# Patient Record
Sex: Female | Born: 1965 | Race: White | Hispanic: No | Smoking: Former smoker
Health system: Southern US, Community
[De-identification: ages and names within clinical notes are randomized; demographics above are authoritative.]

## PROBLEM LIST (undated history)

## (undated) DIAGNOSIS — E1143 Type 2 diabetes mellitus with diabetic autonomic (poly)neuropathy: Secondary | ICD-10-CM

## (undated) DIAGNOSIS — E785 Hyperlipidemia, unspecified: Secondary | ICD-10-CM

## (undated) DIAGNOSIS — Z5189 Encounter for other specified aftercare: Secondary | ICD-10-CM

## (undated) DIAGNOSIS — I10 Essential (primary) hypertension: Secondary | ICD-10-CM

## (undated) DIAGNOSIS — F419 Anxiety disorder, unspecified: Secondary | ICD-10-CM

## (undated) DIAGNOSIS — I219 Acute myocardial infarction, unspecified: Secondary | ICD-10-CM

## (undated) DIAGNOSIS — K219 Gastro-esophageal reflux disease without esophagitis: Secondary | ICD-10-CM

## (undated) DIAGNOSIS — I252 Old myocardial infarction: Secondary | ICD-10-CM

## (undated) DIAGNOSIS — R0789 Other chest pain: Secondary | ICD-10-CM

## (undated) DIAGNOSIS — J309 Allergic rhinitis, unspecified: Secondary | ICD-10-CM

## (undated) DIAGNOSIS — H269 Unspecified cataract: Secondary | ICD-10-CM

## (undated) DIAGNOSIS — I2510 Atherosclerotic heart disease of native coronary artery without angina pectoris: Secondary | ICD-10-CM

## (undated) DIAGNOSIS — E039 Hypothyroidism, unspecified: Secondary | ICD-10-CM

## (undated) DIAGNOSIS — R9430 Abnormal result of cardiovascular function study, unspecified: Secondary | ICD-10-CM

## (undated) DIAGNOSIS — K3184 Gastroparesis: Secondary | ICD-10-CM

## (undated) DIAGNOSIS — Z951 Presence of aortocoronary bypass graft: Secondary | ICD-10-CM

## (undated) DIAGNOSIS — I639 Cerebral infarction, unspecified: Secondary | ICD-10-CM

## (undated) DIAGNOSIS — T7840XA Allergy, unspecified, initial encounter: Secondary | ICD-10-CM

## (undated) DIAGNOSIS — H409 Unspecified glaucoma: Secondary | ICD-10-CM

## (undated) DIAGNOSIS — K760 Fatty (change of) liver, not elsewhere classified: Secondary | ICD-10-CM

## (undated) DIAGNOSIS — S8991XA Unspecified injury of right lower leg, initial encounter: Secondary | ICD-10-CM

## (undated) DIAGNOSIS — K222 Esophageal obstruction: Secondary | ICD-10-CM

## (undated) HISTORY — DX: Esophageal obstruction: K22.2

## (undated) HISTORY — DX: Cerebral infarction, unspecified: I63.9

## (undated) HISTORY — PX: TUBAL LIGATION: SHX77

## (undated) HISTORY — DX: Hypothyroidism, unspecified: E03.9

## (undated) HISTORY — DX: Abnormal result of cardiovascular function study, unspecified: R94.30

## (undated) HISTORY — DX: Atherosclerotic heart disease of native coronary artery without angina pectoris: I25.10

## (undated) HISTORY — PX: ANGIOPLASTY: SHX39

## (undated) HISTORY — DX: Acute myocardial infarction, unspecified: I21.9

## (undated) HISTORY — PX: CARDIAC SURGERY: SHX584

## (undated) HISTORY — DX: Unspecified glaucoma: H40.9

## (undated) HISTORY — DX: Gastroparesis: E11.43

## (undated) HISTORY — DX: Unspecified injury of right lower leg, initial encounter: S89.91XA

## (undated) HISTORY — DX: Old myocardial infarction: I25.2

## (undated) HISTORY — DX: Allergy, unspecified, initial encounter: T78.40XA

## (undated) HISTORY — PX: EYE SURGERY: SHX253

## (undated) HISTORY — DX: Presence of aortocoronary bypass graft: Z95.1

## (undated) HISTORY — DX: Hyperlipidemia, unspecified: E78.5

## (undated) HISTORY — DX: Allergic rhinitis, unspecified: J30.9

## (undated) HISTORY — DX: Fatty (change of) liver, not elsewhere classified: K76.0

## (undated) HISTORY — DX: Anxiety disorder, unspecified: F41.9

## (undated) HISTORY — DX: Encounter for other specified aftercare: Z51.89

## (undated) HISTORY — DX: Unspecified cataract: H26.9

## (undated) HISTORY — DX: Type 2 diabetes mellitus with diabetic autonomic (poly)neuropathy: K31.84

## (undated) HISTORY — DX: Gastro-esophageal reflux disease without esophagitis: K21.9

---

## 1998-10-17 ENCOUNTER — Emergency Department (HOSPITAL_COMMUNITY): Admission: EM | Admit: 1998-10-17 | Discharge: 1998-10-17 | Payer: Self-pay | Admitting: Emergency Medicine

## 1998-10-17 ENCOUNTER — Encounter: Payer: Self-pay | Admitting: Emergency Medicine

## 2002-10-06 ENCOUNTER — Encounter: Payer: Self-pay | Admitting: Emergency Medicine

## 2002-10-06 ENCOUNTER — Emergency Department (HOSPITAL_COMMUNITY): Admission: EM | Admit: 2002-10-06 | Discharge: 2002-10-06 | Payer: Self-pay | Admitting: Emergency Medicine

## 2003-06-23 ENCOUNTER — Ambulatory Visit (HOSPITAL_COMMUNITY): Admission: RE | Admit: 2003-06-23 | Discharge: 2003-06-23 | Payer: Self-pay | Admitting: Family Medicine

## 2004-04-04 ENCOUNTER — Emergency Department (HOSPITAL_COMMUNITY): Admission: EM | Admit: 2004-04-04 | Discharge: 2004-04-04 | Payer: Self-pay | Admitting: Emergency Medicine

## 2005-10-10 ENCOUNTER — Ambulatory Visit: Payer: Self-pay | Admitting: Cardiology

## 2005-10-18 ENCOUNTER — Ambulatory Visit: Payer: Self-pay | Admitting: Cardiology

## 2006-07-03 ENCOUNTER — Inpatient Hospital Stay (HOSPITAL_COMMUNITY): Admission: AD | Admit: 2006-07-03 | Discharge: 2006-07-05 | Payer: Self-pay | Admitting: Cardiovascular Disease

## 2006-07-03 ENCOUNTER — Ambulatory Visit: Payer: Self-pay | Admitting: Cardiology

## 2006-07-26 ENCOUNTER — Ambulatory Visit: Payer: Self-pay | Admitting: Cardiology

## 2006-08-29 ENCOUNTER — Ambulatory Visit: Payer: Self-pay | Admitting: Physician Assistant

## 2006-08-31 ENCOUNTER — Inpatient Hospital Stay (HOSPITAL_BASED_OUTPATIENT_CLINIC_OR_DEPARTMENT_OTHER): Admission: RE | Admit: 2006-08-31 | Discharge: 2006-08-31 | Payer: Self-pay | Admitting: Cardiology

## 2006-08-31 ENCOUNTER — Ambulatory Visit: Payer: Self-pay | Admitting: Cardiology

## 2006-09-26 ENCOUNTER — Ambulatory Visit: Payer: Self-pay | Admitting: Cardiology

## 2006-11-06 ENCOUNTER — Ambulatory Visit: Payer: Self-pay | Admitting: Cardiology

## 2006-12-24 ENCOUNTER — Ambulatory Visit: Payer: Self-pay | Admitting: Cardiology

## 2007-02-07 ENCOUNTER — Ambulatory Visit: Payer: Self-pay | Admitting: Cardiology

## 2007-02-08 ENCOUNTER — Ambulatory Visit (HOSPITAL_COMMUNITY): Admission: RE | Admit: 2007-02-08 | Discharge: 2007-02-08 | Payer: Self-pay | Admitting: Internal Medicine

## 2007-03-11 ENCOUNTER — Emergency Department (HOSPITAL_COMMUNITY): Admission: EM | Admit: 2007-03-11 | Discharge: 2007-03-12 | Payer: Self-pay | Admitting: Emergency Medicine

## 2007-03-13 ENCOUNTER — Ambulatory Visit: Payer: Self-pay | Admitting: Gastroenterology

## 2007-03-14 ENCOUNTER — Ambulatory Visit: Payer: Self-pay | Admitting: Gastroenterology

## 2007-03-14 DIAGNOSIS — K209 Esophagitis, unspecified without bleeding: Secondary | ICD-10-CM | POA: Insufficient documentation

## 2007-04-22 ENCOUNTER — Ambulatory Visit: Payer: Self-pay | Admitting: Gastroenterology

## 2007-06-12 DIAGNOSIS — E119 Type 2 diabetes mellitus without complications: Secondary | ICD-10-CM | POA: Insufficient documentation

## 2007-06-12 DIAGNOSIS — Z794 Long term (current) use of insulin: Secondary | ICD-10-CM

## 2007-06-12 DIAGNOSIS — F419 Anxiety disorder, unspecified: Secondary | ICD-10-CM | POA: Insufficient documentation

## 2007-06-12 DIAGNOSIS — J45909 Unspecified asthma, uncomplicated: Secondary | ICD-10-CM | POA: Insufficient documentation

## 2007-06-12 DIAGNOSIS — R1310 Dysphagia, unspecified: Secondary | ICD-10-CM | POA: Insufficient documentation

## 2007-06-12 DIAGNOSIS — I25810 Atherosclerosis of coronary artery bypass graft(s) without angina pectoris: Secondary | ICD-10-CM | POA: Insufficient documentation

## 2007-06-21 ENCOUNTER — Ambulatory Visit: Payer: Self-pay | Admitting: Gastroenterology

## 2007-08-16 ENCOUNTER — Ambulatory Visit: Payer: Self-pay | Admitting: Cardiology

## 2007-08-19 ENCOUNTER — Inpatient Hospital Stay (HOSPITAL_COMMUNITY): Admission: RE | Admit: 2007-08-19 | Discharge: 2007-08-20 | Payer: Self-pay | Admitting: Cardiology

## 2007-08-19 ENCOUNTER — Ambulatory Visit: Payer: Self-pay | Admitting: Cardiology

## 2007-08-19 ENCOUNTER — Inpatient Hospital Stay (HOSPITAL_BASED_OUTPATIENT_CLINIC_OR_DEPARTMENT_OTHER): Admission: RE | Admit: 2007-08-19 | Discharge: 2007-08-19 | Payer: Self-pay | Admitting: Cardiology

## 2007-08-24 ENCOUNTER — Emergency Department (HOSPITAL_COMMUNITY): Admission: EM | Admit: 2007-08-24 | Discharge: 2007-08-25 | Payer: Self-pay | Admitting: Emergency Medicine

## 2007-09-13 ENCOUNTER — Encounter: Payer: Self-pay | Admitting: Physician Assistant

## 2007-09-13 ENCOUNTER — Ambulatory Visit: Payer: Self-pay | Admitting: Cardiology

## 2007-09-26 ENCOUNTER — Encounter: Payer: Self-pay | Admitting: Cardiology

## 2008-01-07 ENCOUNTER — Encounter: Payer: Self-pay | Admitting: Gastroenterology

## 2008-01-13 ENCOUNTER — Ambulatory Visit: Payer: Self-pay | Admitting: Cardiology

## 2008-02-04 ENCOUNTER — Ambulatory Visit: Payer: Self-pay | Admitting: Gastroenterology

## 2008-02-04 DIAGNOSIS — R079 Chest pain, unspecified: Secondary | ICD-10-CM | POA: Insufficient documentation

## 2008-02-18 ENCOUNTER — Telehealth: Payer: Self-pay | Admitting: Gastroenterology

## 2008-02-26 ENCOUNTER — Ambulatory Visit (HOSPITAL_COMMUNITY): Admission: RE | Admit: 2008-02-26 | Discharge: 2008-02-26 | Payer: Self-pay | Admitting: Internal Medicine

## 2008-04-22 ENCOUNTER — Encounter: Payer: Self-pay | Admitting: Cardiology

## 2008-05-22 DIAGNOSIS — I219 Acute myocardial infarction, unspecified: Secondary | ICD-10-CM

## 2008-05-22 HISTORY — PX: OTHER SURGICAL HISTORY: SHX169

## 2008-05-22 HISTORY — DX: Acute myocardial infarction, unspecified: I21.9

## 2008-08-07 ENCOUNTER — Telehealth: Payer: Self-pay | Admitting: Gastroenterology

## 2008-08-07 ENCOUNTER — Encounter: Payer: Self-pay | Admitting: Cardiology

## 2008-09-30 ENCOUNTER — Encounter: Payer: Self-pay | Admitting: Cardiology

## 2008-11-02 ENCOUNTER — Ambulatory Visit: Payer: Self-pay | Admitting: Cardiology

## 2008-12-09 ENCOUNTER — Telehealth: Payer: Self-pay | Admitting: Gastroenterology

## 2009-01-18 ENCOUNTER — Telehealth: Payer: Self-pay | Admitting: Gastroenterology

## 2009-01-19 ENCOUNTER — Encounter: Payer: Self-pay | Admitting: Gastroenterology

## 2009-01-19 ENCOUNTER — Telehealth: Payer: Self-pay | Admitting: Cardiology

## 2009-01-19 DIAGNOSIS — K219 Gastro-esophageal reflux disease without esophagitis: Secondary | ICD-10-CM | POA: Insufficient documentation

## 2009-01-22 ENCOUNTER — Ambulatory Visit: Payer: Self-pay | Admitting: Cardiovascular Disease

## 2009-01-22 ENCOUNTER — Emergency Department (HOSPITAL_COMMUNITY): Admission: EM | Admit: 2009-01-22 | Discharge: 2009-01-22 | Payer: Self-pay | Admitting: Emergency Medicine

## 2009-01-22 ENCOUNTER — Ambulatory Visit (HOSPITAL_COMMUNITY): Admission: RE | Admit: 2009-01-22 | Discharge: 2009-01-22 | Payer: Self-pay | Admitting: Gastroenterology

## 2009-01-22 ENCOUNTER — Ambulatory Visit: Payer: Self-pay | Admitting: Gastroenterology

## 2009-01-22 ENCOUNTER — Inpatient Hospital Stay (HOSPITAL_COMMUNITY): Admission: AD | Admit: 2009-01-22 | Discharge: 2009-01-31 | Payer: Self-pay | Admitting: Cardiology

## 2009-01-23 ENCOUNTER — Encounter: Payer: Self-pay | Admitting: Cardiology

## 2009-01-23 ENCOUNTER — Ambulatory Visit: Payer: Self-pay | Admitting: Cardiothoracic Surgery

## 2009-01-26 HISTORY — PX: CORONARY ARTERY BYPASS GRAFT: SHX141

## 2009-02-01 ENCOUNTER — Telehealth: Payer: Self-pay | Admitting: Gastroenterology

## 2009-02-03 ENCOUNTER — Telehealth: Payer: Self-pay | Admitting: Cardiology

## 2009-02-05 ENCOUNTER — Emergency Department (HOSPITAL_COMMUNITY): Admission: EM | Admit: 2009-02-05 | Discharge: 2009-02-06 | Payer: Self-pay | Admitting: Emergency Medicine

## 2009-02-15 ENCOUNTER — Ambulatory Visit: Payer: Self-pay | Admitting: Cardiology

## 2009-02-15 ENCOUNTER — Ambulatory Visit: Payer: Self-pay | Admitting: Cardiovascular Disease

## 2009-02-16 ENCOUNTER — Encounter: Payer: Self-pay | Admitting: Cardiology

## 2009-02-16 ENCOUNTER — Inpatient Hospital Stay (HOSPITAL_COMMUNITY): Admission: EM | Admit: 2009-02-16 | Discharge: 2009-02-19 | Payer: Self-pay | Admitting: Emergency Medicine

## 2009-02-16 ENCOUNTER — Encounter (INDEPENDENT_AMBULATORY_CARE_PROVIDER_SITE_OTHER): Payer: Self-pay | Admitting: Cardiovascular Disease

## 2009-02-16 ENCOUNTER — Ambulatory Visit: Payer: Self-pay | Admitting: Vascular Surgery

## 2009-02-17 ENCOUNTER — Encounter: Payer: Self-pay | Admitting: Cardiology

## 2009-02-17 ENCOUNTER — Encounter: Payer: Self-pay | Admitting: Internal Medicine

## 2009-02-25 ENCOUNTER — Ambulatory Visit: Payer: Self-pay | Admitting: Cardiothoracic Surgery

## 2009-02-25 ENCOUNTER — Encounter: Payer: Self-pay | Admitting: Cardiology

## 2009-02-25 ENCOUNTER — Encounter: Admission: RE | Admit: 2009-02-25 | Discharge: 2009-02-25 | Payer: Self-pay | Admitting: Cardiothoracic Surgery

## 2009-03-08 ENCOUNTER — Encounter: Payer: Self-pay | Admitting: Cardiology

## 2009-03-22 ENCOUNTER — Encounter: Payer: Self-pay | Admitting: Cardiology

## 2009-03-26 ENCOUNTER — Encounter: Payer: Self-pay | Admitting: Cardiology

## 2009-03-26 ENCOUNTER — Ambulatory Visit: Payer: Self-pay | Admitting: Cardiothoracic Surgery

## 2009-03-30 ENCOUNTER — Telehealth: Payer: Self-pay | Admitting: Gastroenterology

## 2009-03-31 ENCOUNTER — Encounter: Payer: Self-pay | Admitting: Cardiology

## 2009-04-01 ENCOUNTER — Ambulatory Visit: Payer: Self-pay | Admitting: Gastroenterology

## 2009-04-07 ENCOUNTER — Encounter: Payer: Self-pay | Admitting: Physician Assistant

## 2009-04-09 ENCOUNTER — Encounter: Payer: Self-pay | Admitting: Cardiothoracic Surgery

## 2009-04-09 ENCOUNTER — Ambulatory Visit (HOSPITAL_COMMUNITY): Admission: RE | Admit: 2009-04-09 | Discharge: 2009-04-09 | Payer: Self-pay | Admitting: Gastroenterology

## 2009-04-09 ENCOUNTER — Ambulatory Visit: Payer: Self-pay | Admitting: Gastroenterology

## 2009-04-12 ENCOUNTER — Ambulatory Visit: Payer: Self-pay | Admitting: Cardiology

## 2009-04-12 DIAGNOSIS — R062 Wheezing: Secondary | ICD-10-CM | POA: Insufficient documentation

## 2009-04-12 DIAGNOSIS — R002 Palpitations: Secondary | ICD-10-CM | POA: Insufficient documentation

## 2009-04-21 ENCOUNTER — Ambulatory Visit: Payer: Self-pay | Admitting: Cardiology

## 2009-04-24 ENCOUNTER — Encounter: Payer: Self-pay | Admitting: Cardiology

## 2009-04-28 ENCOUNTER — Telehealth: Payer: Self-pay | Admitting: Gastroenterology

## 2009-04-29 ENCOUNTER — Encounter (INDEPENDENT_AMBULATORY_CARE_PROVIDER_SITE_OTHER): Payer: Self-pay | Admitting: *Deleted

## 2009-04-29 ENCOUNTER — Ambulatory Visit: Payer: Self-pay | Admitting: Internal Medicine

## 2009-04-29 DIAGNOSIS — R1319 Other dysphagia: Secondary | ICD-10-CM | POA: Insufficient documentation

## 2009-04-29 DIAGNOSIS — M62838 Other muscle spasm: Secondary | ICD-10-CM | POA: Insufficient documentation

## 2009-05-05 ENCOUNTER — Telehealth: Payer: Self-pay | Admitting: Gastroenterology

## 2009-05-13 ENCOUNTER — Ambulatory Visit: Payer: Self-pay | Admitting: Gastroenterology

## 2009-05-13 DIAGNOSIS — R634 Abnormal weight loss: Secondary | ICD-10-CM | POA: Insufficient documentation

## 2009-05-17 ENCOUNTER — Encounter: Payer: Self-pay | Admitting: Cardiology

## 2009-06-14 ENCOUNTER — Ambulatory Visit: Payer: Self-pay | Admitting: Gastroenterology

## 2009-06-14 DIAGNOSIS — R112 Nausea with vomiting, unspecified: Secondary | ICD-10-CM | POA: Insufficient documentation

## 2009-06-22 ENCOUNTER — Ambulatory Visit (HOSPITAL_COMMUNITY): Admission: RE | Admit: 2009-06-22 | Discharge: 2009-06-22 | Payer: Self-pay | Admitting: Gastroenterology

## 2009-06-30 ENCOUNTER — Ambulatory Visit: Payer: Self-pay | Admitting: Gastroenterology

## 2009-07-05 ENCOUNTER — Telehealth: Payer: Self-pay | Admitting: Gastroenterology

## 2009-07-06 ENCOUNTER — Encounter: Payer: Self-pay | Admitting: Gastroenterology

## 2009-07-07 ENCOUNTER — Telehealth: Payer: Self-pay | Admitting: Gastroenterology

## 2009-07-08 ENCOUNTER — Encounter: Payer: Self-pay | Admitting: Gastroenterology

## 2009-07-30 ENCOUNTER — Ambulatory Visit: Payer: Self-pay | Admitting: Gastroenterology

## 2009-08-02 ENCOUNTER — Telehealth: Payer: Self-pay | Admitting: Gastroenterology

## 2009-08-02 ENCOUNTER — Encounter: Payer: Self-pay | Admitting: Gastroenterology

## 2009-08-03 ENCOUNTER — Encounter: Admission: RE | Admit: 2009-08-03 | Discharge: 2009-11-01 | Payer: Self-pay | Admitting: Neurological Surgery

## 2009-08-04 ENCOUNTER — Encounter: Payer: Self-pay | Admitting: Gastroenterology

## 2009-09-06 ENCOUNTER — Telehealth: Payer: Self-pay | Admitting: Gastroenterology

## 2009-09-27 ENCOUNTER — Ambulatory Visit: Payer: Self-pay | Admitting: Cardiology

## 2009-10-07 ENCOUNTER — Encounter (INDEPENDENT_AMBULATORY_CARE_PROVIDER_SITE_OTHER): Payer: Self-pay | Admitting: *Deleted

## 2009-10-25 ENCOUNTER — Telehealth: Payer: Self-pay | Admitting: Gastroenterology

## 2009-12-07 ENCOUNTER — Telehealth: Payer: Self-pay | Admitting: Gastroenterology

## 2010-01-06 ENCOUNTER — Encounter (INDEPENDENT_AMBULATORY_CARE_PROVIDER_SITE_OTHER): Payer: Self-pay | Admitting: *Deleted

## 2010-01-11 ENCOUNTER — Encounter (INDEPENDENT_AMBULATORY_CARE_PROVIDER_SITE_OTHER): Payer: Self-pay | Admitting: *Deleted

## 2010-01-11 ENCOUNTER — Telehealth (INDEPENDENT_AMBULATORY_CARE_PROVIDER_SITE_OTHER): Payer: Self-pay | Admitting: *Deleted

## 2010-01-11 ENCOUNTER — Ambulatory Visit: Payer: Self-pay | Admitting: Cardiology

## 2010-01-17 ENCOUNTER — Encounter: Payer: Self-pay | Admitting: Cardiology

## 2010-01-17 ENCOUNTER — Ambulatory Visit: Payer: Self-pay | Admitting: Cardiology

## 2010-01-19 ENCOUNTER — Encounter (INDEPENDENT_AMBULATORY_CARE_PROVIDER_SITE_OTHER): Payer: Self-pay | Admitting: *Deleted

## 2010-01-20 ENCOUNTER — Telehealth: Payer: Self-pay | Admitting: Gastroenterology

## 2010-01-26 ENCOUNTER — Telehealth (INDEPENDENT_AMBULATORY_CARE_PROVIDER_SITE_OTHER): Payer: Self-pay | Admitting: *Deleted

## 2010-02-01 ENCOUNTER — Encounter: Payer: Self-pay | Admitting: Physician Assistant

## 2010-02-15 ENCOUNTER — Encounter (INDEPENDENT_AMBULATORY_CARE_PROVIDER_SITE_OTHER): Payer: Self-pay | Admitting: *Deleted

## 2010-02-21 ENCOUNTER — Encounter: Payer: Self-pay | Admitting: Physician Assistant

## 2010-02-21 ENCOUNTER — Ambulatory Visit: Payer: Self-pay | Admitting: Cardiology

## 2010-02-21 DIAGNOSIS — E785 Hyperlipidemia, unspecified: Secondary | ICD-10-CM | POA: Insufficient documentation

## 2010-02-23 ENCOUNTER — Telehealth (INDEPENDENT_AMBULATORY_CARE_PROVIDER_SITE_OTHER): Payer: Self-pay | Admitting: *Deleted

## 2010-03-01 ENCOUNTER — Telehealth: Payer: Self-pay | Admitting: Gastroenterology

## 2010-03-09 ENCOUNTER — Encounter (INDEPENDENT_AMBULATORY_CARE_PROVIDER_SITE_OTHER): Payer: Self-pay | Admitting: *Deleted

## 2010-03-29 ENCOUNTER — Other Ambulatory Visit: Admission: RE | Admit: 2010-03-29 | Discharge: 2010-03-29 | Payer: Self-pay | Admitting: Obstetrics & Gynecology

## 2010-04-07 ENCOUNTER — Ambulatory Visit (HOSPITAL_COMMUNITY): Admission: RE | Admit: 2010-04-07 | Payer: Self-pay | Admitting: Obstetrics & Gynecology

## 2010-04-11 ENCOUNTER — Telehealth: Payer: Self-pay | Admitting: Gastroenterology

## 2010-05-18 ENCOUNTER — Telehealth (INDEPENDENT_AMBULATORY_CARE_PROVIDER_SITE_OTHER): Payer: Self-pay | Admitting: *Deleted

## 2010-05-24 ENCOUNTER — Telehealth: Payer: Self-pay | Admitting: Gastroenterology

## 2010-06-11 ENCOUNTER — Encounter: Payer: Self-pay | Admitting: Obstetrics & Gynecology

## 2010-06-23 NOTE — Progress Notes (Signed)
Summary: Medication  Phone Note Call from Patient Call back at Home Phone [phone]   Caller: Patient Call For: Dr. Arlyce Dice Reason for Call: Refill Medication Summary of Call: Needs a refill on her Hyomax and Xanax.....[facility] [place] [phone] Initial call taken by: Karna Christmas,  May 24, 2010 9:14 AM  Follow-up for Phone Call        Meds sent to pharmacy Follow-up by: Merri Ray CMA Duncan Dull),  May 24, 2010 1:01 PM    Prescriptions: ALPRAZOLAM 0.5 MG TABS (ALPRAZOLAM) take one to 2 tabs q.h.s. p.r.n.  #30 x 2   Entered by:   Merri Ray CMA (AAMA)   Authorized by:   Louis Meckel MD   Signed by:   Merri Ray CMA (AAMA) on 05/24/2010   Method used:   Printed then faxed to ...       [facility] (retail)       [address]       [place], [place]  [address]       Ph: [phone]       Fax: [phone]   RxID:   [id number] HYOMAX-SL 0.125 MG SUBL (HYOSCYAMINE SULFATE) Dissovle under tounge every four hours  #30 x 3   Entered by:   Merri Ray CMA (AAMA)   Authorized by:   Louis Meckel MD   Signed by:   Merri Ray CMA (AAMA) on 05/24/2010   Method used:   Electronically to        [facility]* (retail)       [address]       [place], [place]  [address]       Ph: [phone]       Fax: [phone]   RxID:   [id number]

## 2010-06-23 NOTE — Letter (Signed)
Summary: Appt Reminder 2  [facility]  [address] [place], [place] [address]   Phone: [phone]  Fax: [phone]        July 06, 2009 MRN: [id number]    CHARMELLE SOH [address] [place], [place]  [address]    Dear Ms. Dingley,   You have a return appointment with Dr.Emmerson Taddei Arlyce Dice on 07-30-09 at 1:45pm. Please remember to bring a complete list of the medicines you are taking, your insurance card and your co-pay.  If you have to cancel or reschedule this appointment, please call before 5:00 pm the evening before to avoid a cancellation fee.  If you have any questions or concerns, please call [phone].    Sincerely,    Laureen Ochs LPN  Appended Document: Appt Reminder 2 Letter mailed to patient.

## 2010-06-23 NOTE — Assessment & Plan Note (Signed)
Summary: 6 MO FU PER MAY REMINDER-SRS   Visit Type:  Follow-up Referring Provider:  n/a Primary Provider:  Alfredia Client, MD  CC:  follow-up visit.  History of Present Illness: the patient is a 45 year old female with a history of coronary bypass grafting x4 January 26, 2009. Prior to this the patient had several stent placements. She has had continuous chest pain/chest wall pain after her surgery. She reports pain in the left side of his chest as well as around the site of the sternotomy upon palpation as well is lying on her left side. The patient has tried taking Tylenol with little relief. I palpated her sternotomy and although it is not clear dehiscence the sternum does not appear to be well fused. The patient denies any palpitations shortness of breath orthopnea PND.  The patient has diabetes has been recently diagnosed with gastroparesis. She was unable to tolerate Reglan and is now taking erythromycin without any significant side effects.  Preventive Screening-Counseling & Management  Alcohol-Tobacco     Smoking Status: quit     Year Quit: 1995   Current Medications (verified): 1)  Aspir-Trin 325 Mg Tbec (Aspirin) .... Take 1 Tablet By Mouth Once A Day 2)  Nitrostat 0.4 Mg Subl (Nitroglycerin) .... Use As Directed 3)  Albuterol Sulfate (2.5 Mg/11ml) 0.083% Nebu (Albuterol Sulfate) .... As Needed 4)  Crestor 10 Mg Tabs (Rosuvastatin Calcium) .... Take 1 Tablet By Mouth Once A Day 5)  Atenolol 25 Mg Tabs (Atenolol) .... One Tablet By Mouth Once Daily 6)  Lorazepam 0.5 Mg Tabs (Lorazepam) .... Take 1 Tablet By Mouth Once A Day 7)  Humalog Mix 75/25 75-25 % Susp (Insulin Lispro Prot & Lispro) .Marland Kitchen.. 10 Units Before Meals 8)  Flovent Hfa 44 Mcg/act Aero (Fluticasone Propionate  Hfa) .... 2 Puffs Twice A Day 9)  Hyomax-Sl 0.125 Mg Subl (Hyoscyamine Sulfate) .... Dissovle Under Tounge Every Four Hours 10)  Prevacid Solutab 30 Mg Tbdp (Lansoprazole) .... Take One Daily 11)  Alprazolam 0.5  Mg Tabs (Alprazolam) .... Take One To 2 Tabs Q.h.s. P.r.n. 12)  Erythromycin Base 250 Mg Cpep (Erythromycin Base) .... Take 1 &1/4 Tsp By Mouth Four Times A Day 13)  Tylenol With Codeine #3 300-30 Mg Tabs (Acetaminophen-Codeine) .... Take 1-2 Tabs Every 6 Hours As Needed Pain  Allergies (verified): 1)  ! Penicillin 2)  ! Metformin  Comments:  Nurse/Medical Assistant: The patient's medications and allergies were reviewed with the patient and were updated in the Medication and Allergy Lists. List and verbally reviewed.  Past History:  Past Medical History: Last updated: 05/13/2009 ANXIETY (ICD-300.00) ASTHMA (ICD-493.90) DIABETES MELLITUS (ICD-250.00) Hx of MYOCARDIAL INFARCTION, HX OF (ICD-412)  s/p angioplasty with stents CORONARY ARTERY DISEASE (ICD-414.00) GERD/Esophageal Stricture coronary artery bypass grafting x4 January 26, 2009 Postoperative chest pain secondary to muscle spasm  Past Surgical History: Last updated: 04/01/2009 PTCA-Stent Open Heart Surgery--Bypass   Family History: Last updated: 06/30/2009 Family History of Diabetes: mother, maternal aunt, paternal uncles, paternal aunts Family History of Heart Disease: father, paternal uncles No FH of Colon Cancer  Social History: Last updated: 06/30/2009 Occupation: Unemployed Patient is a former smoker.  Alcohol Use - no Illicit Drug Use - no Patient gets regular exercise. Daily Caffeine Use  Risk Factors: Exercise: yes (02/04/2008)  Risk Factors: Smoking Status: quit (09/27/2009)  Review of Systems  The patient denies fatigue, malaise, fever, weight gain/loss, vision loss, decreased hearing, hoarseness, chest pain, palpitations, shortness of breath, prolonged cough, wheezing, sleep apnea, coughing  up blood, abdominal pain, blood in stool, nausea, vomiting, diarrhea, heartburn, incontinence, blood in urine, muscle weakness, joint pain, leg swelling, rash, skin lesions, headache, fainting, dizziness,  depression, anxiety, enlarged lymph nodes, easy bruising or bleeding, and environmental allergies.    Vital Signs:  Patient profile:   45 year old female Height:      64 inches Weight:      169 pounds Pulse rate:   69 / minute BP sitting:   113 / 72  (left arm) Cuff size:   regular  Vitals Entered By: Carlye Grippe (Sep 27, 2009 1:46 PM) CC: follow-up visit   Physical Exam  Additional Exam:  General: Well-developed, well-nourished in no distress head: Normocephalic and atraumatic eyes PERRLA/EOMI intact, conjunctiva and lids normal nose: No deformity or lesions mouth normal dentition, normal posterior pharynx neck: Supple, no JVD.  No masses, thyromegaly or abnormal cervical nodes lungs: Normal breath sounds bilaterally without wheezing.  Normal percussion heart: regular rate and rhythm with normal S1 and S2, no S3 or S4.  PMI is normal.  No pathological murmurs abdomen: Normal bowel sounds, abdomen is soft and nontender without masses, organomegaly or hernias noted.  No hepatosplenomegaly musculoskeletal: Back normal, normal gait muscle strength and tone normal pulsus: Pulse is normal in all 4 extremities Extremities: No peripheral pitting edema neurologic: Alert and oriented x 3 skin: Intact without lesions or rashes cervical nodes: No significant adenopathy psychologic: Normal affect    Impression & Recommendations:  Problem # 1:  POSTSURGICAL AORTOCORONARY BYPASS STATUS (ICD-V45.81) the patient has no recurrent chest pain consistent with angina. She does have pain at the sternotomy site and left hemi-chest. I've ordered a chest x-ray to evaluate her sternum.I have given the patient a prescription for Tylenol #3 in the interim. She may need a referral to the pain clinic. The patient also will have laboratory work done to check her lipid panel and liver function tests. Orders: T-Chest x-ray, 2 views ([address])  Problem # 2:  DYSPHAGIA (JWJ-191.47) patient has been  diagnosed with gastroparesis and is taking erythromycin. I recommended a probiotic as supplement  Problem # 3:  WHEEZING (ICD-786.07) patient takes inhalers. She stable and has no wheezing.  Problem # 4:  DIABETES MELLITUS (ICD-250.00) Assessment: Comment Only  Her updated medication list for this problem includes:    Aspir-trin 325 Mg Tbec (Aspirin) .Marland Kitchen... Take 1 tablet by mouth once a day    Humalog Mix 75/25 75-25 % Susp (Insulin lispro prot & lispro) .Marland KitchenMarland KitchenMarland KitchenMarland Kitchen 10 units before meals  Other Orders: T-Lipid Profile ([id number]) T-Hepatic Function [phone])  Patient Instructions: 1)  Tylenol #3  2)  Chest x-ray  3)  Labs:  lipid panel, liver function  4)  Follow up in  6 months  Prescriptions: TYLENOL WITH CODEINE #3 300-30 MG TABS (ACETAMINOPHEN-CODEINE) take 1-2 tabs every 6 hours as needed pain  #30 x 1   Entered by:   Hoover Brunette, LPN   Authorized by:   Lewayne Bunting, MD, [facility]   Signed by:   Hoover Brunette, LPN on 52/84/1324   Method used:   Print then Give to Patient   RxID:   [id number]

## 2010-06-23 NOTE — Progress Notes (Signed)
Summary: rx refill request from mail order  Phone Note Refill Request Message from:  Fax from Pharmacy on August 02, 2009 11:31 AM  Refills Requested: Medication #1:  PREVACID SOLUTAB 30 MG TBDP Take one daily   Dosage confirmed as above?Dosage Confirmed   Brand Name Necessary? No   Supply Requested: 1 year  Medication #2:  E.E.S. GRANULES 200 MG/5ML SUSR 1 1/4 tsp. every 4 hours 30 min before meals and at bedtime.   Dosage confirmed as above?Dosage Confirmed   Brand Name Necessary? No   Supply Requested: 1 year  Method Requested: Fax to Local Pharmacy Initial call taken by: Merri Ray CMA Duncan Dull),  August 02, 2009 11:31 AM     Appended Document: rx refill request from mail order Pt aprroved through mail order for 1 year-prior auth approved will send approval to be scanned in

## 2010-06-23 NOTE — Assessment & Plan Note (Signed)
Summary: 6 WK F/U PER 8/23 APPT W/DR. CRENSHAW-JM   Visit Type:  Follow-up Primary Provider:  Tamsen Snider Joseph Art)   History of Present Illness: patient returns to office, following recent evaluation for chest pain. She was seen by Dr. Jens Som, and referred for a stress test for evaluation of chest pain, deemed atypical, and most likely musculoskeletal in etiology. A pharmacologic perfusion imaging study was completed, and interpreted as normal by Dr. Andee Lineman.  Clinically, the patient denies any exacerbation of this discomfort, which has persisted since her bypass surgery of one year ago, either by deep inspiration, movement of the upper thorax, or with walking. She refers to a sensation of a "knot" in the left upper chest, and has  easily-induced discomfort with palpation.  Preventive Screening-Counseling & Management  Alcohol-Tobacco     Smoking Status: quit     Year Quit: 1995  Current Medications (verified): 1)  Aspir-Trin 325 Mg Tbec (Aspirin) .... Take 1 Tablet By Mouth Once A Day 2)  Nitrostat 0.4 Mg Subl (Nitroglycerin) .... Use As Directed 3)  Albuterol Sulfate (2.5 Mg/25ml) 0.083% Nebu (Albuterol Sulfate) .... As Needed 4)  Crestor 10 Mg Tabs (Rosuvastatin Calcium) .... Take 1 Tablet By Mouth Once A Day 5)  Atenolol 25 Mg Tabs (Atenolol) .... One Tablet By Mouth Once Daily 6)  Humalog Mix 75/25 75-25 % Susp (Insulin Lispro Prot & Lispro) .Marland Kitchen.. 10 Units Before Meals 7)  Flovent Hfa 44 Mcg/act Aero (Fluticasone Propionate  Hfa) .... 2 Puffs Twice A Day 8)  Hyomax-Sl 0.125 Mg Subl (Hyoscyamine Sulfate) .... Dissovle Under Tounge Every Four Hours 9)  Prevacid Solutab 30 Mg Tbdp (Lansoprazole) .... Take One Daily 10)  Alprazolam 0.5 Mg Tabs (Alprazolam) .... Take One To 2 Tabs Q.h.s. P.r.n. 11)  Erythromycin Base 250 Mg Cpep (Erythromycin Base) .... Take 1 &1/4 Tsp By Mouth Four Times A Day 12)  Tylenol With Codeine #3 300-30 Mg Tabs (Acetaminophen-Codeine) .... Take 1-2 Tabs  Every 6 Hours As Needed Pain  Allergies (verified): 1)  ! Penicillin 2)  ! Metformin  Comments:  Nurse/Medical Assistant: The patient is currently on medications but does not know the name or dosage at this time. Instructed to contact our office with details. Will update medication list at that time.  Past History:  Past Medical History: Last updated: 01/11/2010 ANXIETY (ICD-300.00) ASTHMA (ICD-493.90) DIABETES MELLITUS (ICD-250.00) Hx of MYOCARDIAL INFARCTION, HX OF (ICD-412)  s/p angioplasty with stents CORONARY ARTERY DISEASE (ICD-414.00) GERD/Esophageal Stricture Postoperative chest pain secondary to muscle spasm  Review of Systems       No fevers, chills, hemoptysis, dysphagia, melena, hematocheezia, hematuria, rash, claudication, orthopnea, pnd, pedal edema. All other systems negative.   Vital Signs:  Patient profile:   45 year old female Height:      64 inches Weight:      185 pounds Pulse rate:   71 / minute BP sitting:   112 / 75  (left arm) Cuff size:   large  Vitals Entered By: Carlye Grippe (February 21, 2010 1:26 PM)  Physical Exam  Additional Exam:  GEN: 45 year old female, in no distress HEENT: NCAT,PERRLA,EOMI NECK: palpable pulses, no bruits; no JVD; no TM LUNGS: CTA bilaterally HEART: RRR (S1S2); no significant murmurs; no rubs; no gallops ABD: soft, NT; intact BS EXT: intact distal pulses; no edema SKIN: warm, dry MUSC: significant discomfort with palpation of upper chest, along the sternum NEURO: A/O (x3)     Impression & Recommendations:  Problem # 1:  CHEST PAIN (ICD-786.50)  Patient had recent, normal Lexiscan Cardiolite for evaluation of atypical chest pain, most likely musculoskeletal in etiology. She has not had a recent x-ray, and we'll order a 2 view study to rule out possible underlying musculoskeletal pathology, which may have persisted since she underwent bypass surgery, in September of last year.  Problem # 2:  DYSLIPIDEMIA  (ICD-272.4)  we'll request most recent lipid profile from her primary care physician's office. Patient has not taken Crestor in a few months, citing difficulty swallowing this tablet. She was advised to crush the tablet, or allow it to dissolve, to ensure compliance. We will then make further recommendations, pending review of her most recent results. Recommend target LDL 70 or less, if feasible.  Problem # 3:  DIABETES MELLITUS (ICD-250.00) Assessment: Comment Only  Her updated medication list for this problem includes:    Aspir-trin 325 Mg Tbec (Aspirin) .Marland Kitchen... Take 1 tablet by mouth once a day    Humalog Mix 75/25 75-25 % Susp (Insulin lispro prot & lispro) .Marland KitchenMarland KitchenMarland KitchenMarland Kitchen 10 units before meals  Problem # 4:  DYSPHAGIA (ICD-787.29) Assessment: Comment Only  Other Orders: T-Chest x-ray, 2 views ([id number])  Patient Instructions: 1)  chest x-ray  2)  Follow up in  6 months

## 2010-06-23 NOTE — Progress Notes (Signed)
Summary: SHORTNESS OF BREATH & TIGHTNESS IN CHEST  Phone Note Call from Patient Call back at Home Phone [phone]   Reason for Call: Talk to Nurse Summary of Call: NEEDS TO SEE DEGENT SHORTNESS OF BREATH & TIGHTNESS IN CHEST. LEFT MESSAGE WITH GAYLE 01/10/10. Initial call taken by: Dorise Hiss,  January 11, 2010 10:04 AM  Follow-up for Phone Call        Pt has been having a lot of chest pressure. Then last week she had a spell that felt like "I was gone from here." She thought it was blood sugar but that was fine. She states after she ate she felt like she was gasping for breath. She states when she had that spell she felt weak all over her body. She states it was like an anxiety attack but she wasn't anxious about anything. She states it felt like when she had blockages before.  Pt wants to see MD asap. She will see Dr. Jens Som today at 1:45. Follow-up by: Cyril Loosen, RN, BSN,  January 11, 2010 10:52 AM

## 2010-06-23 NOTE — Progress Notes (Signed)
Summary: refill  Phone Note Call from Patient Call back at Home Phone [phone]   Caller: Patient Call For: Dr Arlyce Dice Reason for Call: Refill Medication, Talk to Nurse Summary of Call: Patient needs refill for her Xanax Initial call taken by: Tawni Levy,  January 20, 2010 9:21 AM  Follow-up for Phone Call        #30 w/2 refills phoned to pt. pharmacy, pt. is aware. Pt. instructed to call back as needed.  Follow-up by: Laureen Ochs LPN,  January 20, 2010 9:31 AM    Prescriptions: ALPRAZOLAM 0.5 MG TABS (ALPRAZOLAM) take one to 2 tabs q.h.s. p.r.n.  #30 x 2   Entered by:   Laureen Ochs LPN   Authorized by:   Louis Meckel MD   Signed by:   Laureen Ochs LPN on 40/02/2724   Method used:   Telephoned to ...       [facility] (retail)       [address]       [place], [place]  [address]       Ph: [phone]       Fax: [phone]   RxID:   [id number]

## 2010-06-23 NOTE — Medication Information (Signed)
Summary: [facility]  Silver[facility]   Imported By: Lester [place] 08/26/2009 09:56:49  _____________________________________________________________________  External Attachment:    Type:   Image     Comment:   External Document

## 2010-06-23 NOTE — Progress Notes (Signed)
Summary: CONDITION UPDATE  Phone Note Call from Patient Call back at Home Phone [phone] Call back at [phone]   Caller: Patient Call For: Dr. Arlyce Dice Reason for Call: Talk to Nurse Summary of Call: Reglan is helping, but side effects are bothersome. Also wants Prevacid Solutab script mailed to her. Initial call taken by: Vallarie Mare,  July 05, 2009 1:34 PM  Follow-up for Phone Call        On 06-30-09 was given Reglan 10mg  SL to take [facility]. She states her nausea has improved, she is eating better. But, c/o worsening fatigue, restlessness and bad dreams.   DR.Ninoshka Wainwright PLEASE ADVISE   Additional Follow-up for Phone Call Additional follow up Details #1::        try erythromycin 250mg  qid; d/c reglan. Additional Follow-up by: Louis Meckel MD,  July 06, 2009 9:31 AM    Additional Follow-up for Phone Call Additional follow up Details #2::    Above MD orders reviewed with patient. med. to pharmacy. Pt. to keep scheduled office visit 07-30-09 at 1:45pm. Pt. instructed to call back as needed.  Follow-up by: Laureen Ochs LPN,  July 06, 2009 9:42 AM  New/Updated Medications: ERY-TAB 250 MG TBEC (ERYTHROMYCIN BASE) Take 1 tab 30 minutes before breakfast, lunch, dinner and at bedtime. Prescriptions: PREVACID SOLUTAB 30 MG TBDP (LANSOPRAZOLE) Take one daily  #30 x 12   Entered by:   Laureen Ochs LPN   Authorized by:   Louis Meckel MD   Signed by:   Laureen Ochs LPN on 47/82/9562   Method used:   Print then Give to Patient   RxID:   [id number] ERY-TAB 250 MG TBEC (ERYTHROMYCIN BASE) Take 1 tab 30 minutes before breakfast, lunch, dinner and at bedtime.  #120 x 6   Entered by:   Laureen Ochs LPN   Authorized by:   Louis Meckel MD   Signed by:   Laureen Ochs LPN on 84/13/2440   Method used:   Electronically to        [facility] (retail)       [address]       [place], [place]  [address]       Ph: [phone]  Fax: [phone]   RxID:   [id number]

## 2010-06-23 NOTE — Progress Notes (Signed)
Summary: voicemail message     Phone Note Call from Patient   Summary of Call: MESSAGE LEFT ON VOICEMAIL  -   11:41 - Want to know if okay to take Lodine.  Just had teet pulled, given for pain.   1:32 - Called back and left another message wanting to know if she could get refill on her Tylenol #3.  Left message to return call.  Initial call taken by: Hoover Brunette, LPN,  February 23, 2010 3:12 PM  Follow-up for Phone Call        Left message to return call. Hoover Brunette, LPN  February 25, 2010 3:18 PM   Spoke with pt.  Stated she never got Lodine filled because pharm wanted her to check with [place] before taking.  Also was requesting refill on Tylenol #3.  Stated since she did not get those, she took regular Tylenol instead which worked fine.  Advised her that she did the correct thing because would have not been able to give refill on that med for this anyway.  No further complaints.  Patient verbalized understanding.  Follow-up by: Hoover Brunette, LPN,  March 01, 2010 3:57 PM

## 2010-06-23 NOTE — Progress Notes (Signed)
Summary: Medication refill  Phone Note Call from Patient Call back at Home Phone [phone]   Caller: Patient Call For: Dr. Arlyce Dice Summary of Call: Needs a refill on her Zanax...[phone] [facility] [place] Initial call taken by: Karna Christmas,  March 01, 2010 9:44 AM  Follow-up for Phone Call        Called pt to inform med sent, No answer Follow-up by: Merri Ray CMA Duncan Dull),  March 01, 2010 9:49 AM    Prescriptions: ALPRAZOLAM 0.5 MG TABS (ALPRAZOLAM) take one to 2 tabs q.h.s. p.r.n.  #30 x 2   Entered by:   Merri Ray CMA (AAMA)   Authorized by:   Louis Meckel MD   Signed by:   Merri Ray CMA (AAMA) on 03/01/2010   Method used:   Printed then faxed to ...       [facility] (retail)       [address]       [place], [place]  [address]       Ph: [phone]       Fax: [phone]   RxID:   [id number]

## 2010-06-23 NOTE — Letter (Signed)
Summary: Engineer, materials at [facility]  [address]   [place], [place] [address]   Phone: [phone]  Fax: [phone]        Oct 07, 2009 MRN: [id number]   KINZLEE SELVY [address] [place], [place]  [address]   Dear Ms. Taft,  Your test ordered by Selena Batten has been reviewed by your physician (or physician assistant) and was found to be normal or stable. Your physician (or physician assistant) felt no changes were needed at this time.  ____ Echocardiogram  ____ Cardiac Stress Test  ____ Lab Work  ____ Peripheral vascular study of arms, legs or neck  __X__ CT scan or X-ray  - chest x-ray  ____ Lung or Breathing test  ____ Other:   Thank you.   Hoover Brunette, LPN    Duane Boston, M.D., F.A.C.C. Thressa Sheller, M.D., F.A.C.C. Oneal Grout, M.D., F.A.C.C. Cheree Ditto, M.D., F.A.C.C. Daiva Nakayama, M.D., F.A.C.C. Kenney Houseman, M.D., F.A.C.C. Jeanne Ivan, PA-C

## 2010-06-23 NOTE — Medication Information (Signed)
Summary: RX Folder/ PATIENT ASSISTANCE [facility]  RX Folder/ PATIENT ASSISTANCE [facility]   Imported By: Dorise Hiss 05/27/2009 14:16:53  _____________________________________________________________________  External Attachment:    Type:   Image     Comment:   External Document

## 2010-06-23 NOTE — Medication Information (Signed)
Summary: Lansoprazole Approved/[facility]Script  Lansoprazole Approved/SilverScript   Imported By: Sherian Rein 08/06/2009 15:05:33  _____________________________________________________________________  External Attachment:    Type:   Image     Comment:   External Document

## 2010-06-23 NOTE — Progress Notes (Signed)
Summary: Medication refill  Phone Note Call from Patient Call back at Home Phone [phone]   Caller: Patient Call For: Dr. Arlyce Dice Reason for Call: Refill Medication Summary of Call: Needs a refill on her Zanax Initial call taken by: Karna Christmas,  September 06, 2009 9:15 AM  Follow-up for Phone Call        Called pt to inform faxing in med Follow-up by: Merri Ray CMA Duncan Dull),  September 06, 2009 10:26 AM    New/Updated Medications: ALPRAZOLAM 0.5 MG TABS (ALPRAZOLAM) take one to 2 tabs q.h.s. p.r.n. Prescriptions: ALPRAZOLAM 0.5 MG TABS (ALPRAZOLAM) take one to 2 tabs q.h.s. p.r.n.  #30 x 2   Entered by:   Merri Ray CMA (AAMA)   Authorized by:   Louis Meckel MD   Signed by:   Merri Ray CMA (AAMA) on 09/06/2009   Method used:   Printed then faxed to ...       [facility] (retail)       [address]       [place], [place]  [address]       Ph: [phone]       Fax: [phone]   RxID:   [id number]

## 2010-06-23 NOTE — Letter (Signed)
Summary: Engineer, materials at [facility]  [address]   [place], [place] [address]   Phone: [phone]  Fax: [phone]        March 09, 2010 MRN: [id number]   [name] [address] [place], [place]  [address]   Dear Ms. [name],  Your test ordered by Selena Batten has been reviewed by your physician (or physician assistant) and was found to be normal or stable. Your physician (or physician assistant) felt no changes were needed at this time.  ____ Echocardiogram  ____ Cardiac Stress Test  ____ Lab Work  ____ Peripheral vascular study of arms, legs or neck  __X__ CT scan or X-ray - chest  ____ Lung or Breathing test  ____ Other:   Thank you.   Hoover Brunette, LPN    [name], M.D., F.A.C.C. Thressa Sheller, M.D., F.A.C.C. Oneal Grout, M.D., F.A.C.C. Cheree Ditto, M.D., F.A.C.C. Daiva Nakayama, M.D., F.A.C.C. Kenney Houseman, M.D., F.A.C.C. Jeanne Ivan, PA-C

## 2010-06-23 NOTE — Assessment & Plan Note (Signed)
Summary: F/U FROM STARTING ERYTHROMYCIN           DEBORAH   History of Present Illness Visit Type: Follow-up Visit Primary GI MD: Melvia Heaps MD [facility] Primary Provider: Alfredia Client, MD Requesting Provider: n/a Chief Complaint: F/u from starting Erythromycin. Pt states that she is better and denies any GI complaints  History of Present Illness:   Mrs. Lehtinen has  returned for followup of her nausea and dysphagia.  Symptoms clearly improved with Reglan but this had to be discontinued because of side effects including nightmares and extreme fatigue.  On erythromycin her nausea and dysphagia have entirely subsided.  She is eating well and currently has no GI complaints.   GI Review of Systems      Denies abdominal pain, acid reflux, belching, bloating, chest pain, dysphagia with liquids, dysphagia with solids, heartburn, loss of appetite, nausea, vomiting, vomiting blood, weight loss, and  weight gain.        Denies anal fissure, black tarry stools, change in bowel habit, constipation, diarrhea, diverticulosis, fecal incontinence, heme positive stool, hemorrhoids, irritable bowel syndrome, jaundice, light color stool, liver problems, rectal bleeding, and  rectal pain.    Current Medications (verified): 1)  Aspirin 81 Mg  Tbec (Aspirin) .... Take Three By Mouth Every Day 2)  Nitro-Dur 0.4 Mg/hr  Pt24 (Nitroglycerin) .... As Needed 3)  Albuterol Sulfate (2.5 Mg/98ml) 0.083% Nebu (Albuterol Sulfate) .... As Needed 4)  Crestor 10 Mg Tabs (Rosuvastatin Calcium) .... Take 1 Tablet By Mouth Once A Day 5)  Atenolol 25 Mg Tabs (Atenolol) .... One Tablet By Mouth Once Daily 6)  Lorazepam 0.5 Mg Tabs (Lorazepam) .... Take 1 Tablet By Mouth Once A Day 7)  Humalog Mix 75/25 75-25 % Susp (Insulin Lispro Prot & Lispro) .Marland Kitchen.. 10 Units Before Meals 8)  Flovent Hfa 44 Mcg/act Aero (Fluticasone Propionate  Hfa) .... 2 Puffs Twice A Day 9)  Hyomax-Sl 0.125 Mg Subl (Hyoscyamine Sulfate) .... Dissovle Under Tounge  Every Four Hours 10)  Emetrol 1.87-1.87-21.5 Soln (Fructose-Dextrose-Phosphor Acd) .... As Needed For Nausea 11)  Prevacid Solutab 30 Mg Tbdp (Lansoprazole) .... Take One Daily 12)  Alprazolam 0.5 Mg Tabs (Alprazolam) .... Take One To 2 Tabs Q.h.s. P.r.n. 13)  Ery-Tab 250 Mg Tbec (Erythromycin Base) .... Take 1 Tab 30 Minutes Before Breakfast, Lunch, Dinner and At Bedtime.  Allergies (verified): 1)  ! Penicillin 2)  ! Metformin  Past History:  Past Medical History: Reviewed history from 05/13/2009 and no changes required. ANXIETY (ICD-300.00) ASTHMA (ICD-493.90) DIABETES MELLITUS (ICD-250.00) Hx of MYOCARDIAL INFARCTION, HX OF (ICD-412)  s/p angioplasty with stents CORONARY ARTERY DISEASE (ICD-414.00) GERD/Esophageal Stricture coronary artery bypass grafting x4 January 26, 2009 Postoperative chest pain secondary to muscle spasm  Past Surgical History: Reviewed history from 04/01/2009 and no changes required. PTCA-Stent Open Heart Surgery--Bypass   Family History: Reviewed history from 06/30/2009 and no changes required. Family History of Diabetes: mother, maternal aunt, paternal uncles, paternal aunts Family History of Heart Disease: father, paternal uncles No FH of Colon Cancer  Social History: Reviewed history from 06/30/2009 and no changes required. Occupation: Unemployed Patient is a former smoker.  Alcohol Use - no Illicit Drug Use - no Patient gets regular exercise. Daily Caffeine Use  Review of Systems  The patient denies allergy/sinus, anemia, anxiety-new, arthritis/joint pain, back pain, blood in urine, breast changes/lumps, change in vision, confusion, cough, coughing up blood, depression-new, fainting, fatigue, fever, headaches-new, hearing problems, heart murmur, heart rhythm changes, itching, menstrual pain, muscle  pains/cramps, night sweats, nosebleeds, pregnancy symptoms, shortness of breath, skin rash, sleeping problems, sore throat, swelling of  feet/legs, swollen lymph glands, thirst - excessive , urination - excessive , urination changes/pain, urine leakage, vision changes, and voice change.    Vital Signs:  Patient profile:   45 year old female Height:      64 inches Weight:      162 pounds BMI:     27.91 BSA:     1.79 Pulse rate:   72 / minute Pulse rhythm:   regular BP sitting:   112 / 60  (left arm) Cuff size:   regular  Vitals Entered By: Ok Anis CMA (July 30, 2009 1:49 PM)   Impression & Recommendations:  Problem # 1:  NAUSEA WITH VOMITING (ICD-787.01) Assessment Improved Despite the negative gastric imaging scan I believe her symptoms are due to gastroparesis.  Recommendations #1 continue erythromycin indefinitely.  If symptoms should recur I would consider a trial of domperidone. #2 continue Prevacid solutabs  Patient Instructions: 1)  The medication list was reviewed and reconciled.  All changed / newly prescribed medications were explained.  A complete medication list was provided to the patient / caregiver. 2)  CC Dr. Molly Maduro Day

## 2010-06-23 NOTE — Letter (Signed)
Summary: Engineer, materials at [facility]  [address]   [place], [place] [address]   Phone: [phone]  Fax: [phone]        January 19, 2010 MRN: [id number]    [name] [address] [place], [place]  [address]    Dear Ms. Sundeen,  Your test ordered by Selena Batten has been reviewed by your physician (or physician assistant) and was found to be normal or stable. Your physician (or physician assistant) felt no changes were needed at this time.  ____ Echocardiogram  __X__ Cardiac Stress Test  ____ Lab Work  ____ Peripheral vascular study of arms, legs or neck  ____ CT scan or X-ray  ____ Lung or Breathing test  ____ Other:  Please take the enclosed order to the [facility] for labs that were ordered at your office visit. Do not eat or drink after midnight.   Thank you.   Cyril Loosen, RN, BSN    Duane Boston, M.D., F.A.C.C. Thressa Sheller, M.D., F.A.C.C. Oneal Grout, M.D., F.A.C.C. Cheree Ditto, M.D., F.A.C.C. Daiva Nakayama, M.D., F.A.C.C. Kenney Houseman, M.D., F.A.C.C. Jeanne Ivan, PA-C

## 2010-06-23 NOTE — Progress Notes (Signed)
Summary: Xanax refill  Phone Note Call from Patient Call back at Home Phone [phone]   Call For: Dr Arlyce Dice Reason for Call: Refill Medication Summary of Call: Xanax refill.would like a call back when it has been done.  Initial call taken by: Leanor Kail [facility] [place],  October 25, 2009 8:37 AM  Follow-up for Phone Call        left message on machine rx sent Follow-up by: Harlow Mares CMA Duncan Dull),  October 26, 2009 4:37 PM    Prescriptions: ALPRAZOLAM 0.5 MG TABS (ALPRAZOLAM) take one to 2 tabs q.h.s. p.r.n.  #30 x 2   Entered by:   Harlow Mares CMA (AAMA)   Authorized by:   Louis Meckel MD   Signed by:   Harlow Mares CMA (AAMA) on 10/26/2009   Method used:   Printed then faxed to ...       [facility] (retail)       [address]       [place], [place]  [address]       Ph: [phone]       Fax: [phone]   RxID:   [id number]

## 2010-06-23 NOTE — Assessment & Plan Note (Signed)
Summary: follow-up from seeing Paula/chest pain/bs  aware of canc./no ...   History of Present Illness Visit Type: follow up  Primary GI MD: Melvia Heaps MD [facility] Primary Provider: Alfredia Client, MD Requesting Provider: n/a Chief Complaint: F/u from office visit with Gunnar Fusi for epigastric pain, nausea, and dysphagia. Pt c/o dysphagia with solids  History of Present Illness:   Mr. Ensz has returned complaining of postprandial nausea and vomiting.  Within half an hour of eating she often has to vomit.  Shedevelops an intensely full feeling.  Dysphagia subsided after her last esophageal dilatation.  She has diabetes mellitus and coronary artery disease.   GI Review of Systems    Reports dysphagia with solids.      Denies abdominal pain, acid reflux, belching, bloating, chest pain, dysphagia with liquids, heartburn, loss of appetite, nausea, vomiting, vomiting blood, weight loss, and  weight gain.        Denies anal fissure, black tarry stools, change in bowel habit, constipation, diarrhea, diverticulosis, fecal incontinence, heme positive stool, hemorrhoids, irritable bowel syndrome, jaundice, light color stool, liver problems, rectal bleeding, and  rectal pain.    Current Medications (verified): 1)  Aspirin 81 Mg  Tbec (Aspirin) .... Take Three By Mouth Every Day 2)  Nitro-Dur 0.4 Mg/hr  Pt24 (Nitroglycerin) .... As Needed 3)  Albuterol Sulfate (2.5 Mg/69ml) 0.083% Nebu (Albuterol Sulfate) .... As Needed 4)  Crestor 10 Mg Tabs (Rosuvastatin Calcium) .... Take 1 Tablet By Mouth Once A Day 5)  Atenolol 25 Mg Tabs (Atenolol) .... One Tablet By Mouth Once Daily 6)  Lorazepam 0.5 Mg Tabs (Lorazepam) .... Take 1 Tablet By Mouth Once A Day 7)  Humalog Mix 75/25 75-25 % Susp (Insulin Lispro Prot & Lispro) .Marland Kitchen.. 10 Units Before Meals 8)  Flovent Hfa 44 Mcg/act Aero (Fluticasone Propionate  Hfa) .... 2 Puffs Twice A Day 9)  Hyomax-Sl 0.125 Mg Subl (Hyoscyamine Sulfate) .... Dissovle Under Tounge Every  Four Hours 10)  Emetrol 1.87-1.87-21.5 Soln (Fructose-Dextrose-Phosphor Acd) .... As Needed For Nausea 11)  Prevacid Solutab 30 Mg Tbdp (Lansoprazole) .... Take One Daily 12)  Carafate 1 Gm/73ml Susp (Sucralfate) .... Take 1 Tsp 4 Times Daily 13)  Naprosyn 250 Mg Tabs (Naproxen) .Marland Kitchen.. 1 Two Times A Day Crushed in Soft Foods  Allergies (verified): 1)  ! Penicillin 2)  ! Metformin  Past History:  Past Medical History: Reviewed history from 05/13/2009 and no changes required. ANXIETY (ICD-300.00) ASTHMA (ICD-493.90) DIABETES MELLITUS (ICD-250.00) Hx of MYOCARDIAL INFARCTION, HX OF (ICD-412)  s/p angioplasty with stents CORONARY ARTERY DISEASE (ICD-414.00) GERD/Esophageal Stricture coronary artery bypass grafting x4 January 26, 2009 Postoperative chest pain secondary to muscle spasm  Past Surgical History: Reviewed history from 04/01/2009 and no changes required. PTCA-Stent Open Heart Surgery--Bypass   Family History: Reviewed history from 04/01/2009 and no changes required. Family History of Diabetes:  Family History of Heart Disease:  No FH of Colon Cancer:  Social History: Reviewed history from 02/04/2008 and no changes required. Occupation: Unemployed Patient is a former smoker.  Alcohol Use - no Illicit Drug Use - no Patient gets regular exercise.  Review of Systems  The patient denies allergy/sinus, anemia, anxiety-new, arthritis/joint pain, back pain, blood in urine, breast changes/lumps, change in vision, confusion, cough, coughing up blood, depression-new, fainting, fatigue, fever, headaches-new, hearing problems, heart murmur, heart rhythm changes, itching, menstrual pain, muscle pains/cramps, night sweats, nosebleeds, pregnancy symptoms, shortness of breath, skin rash, sleeping problems, sore throat, swelling of feet/legs, swollen lymph glands, thirst -  excessive , urination - excessive , urination changes/pain, urine leakage, vision changes, and voice change.     Vital Signs:  Patient profile:   45 year old female Height:      64 inches Weight:      162 pounds BMI:     27.91 BSA:     1.79 Pulse rate:   60 / minute Pulse rhythm:   regular BP sitting:   106 / 62  (left arm) Cuff size:   regular  Vitals Entered By: Ok Anis CMA (June 14, 2009 2:05 PM)   Impression & Recommendations:  Problem # 1:  NAUSEA WITH VOMITING (ICD-787.01) Assessment New I suspect that she has gastroparesis.  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 Molly Maduro Day  Appended Document: Orders Update    Clinical Lists Changes  Orders: Added new Test order of Gastric Emptying Scan (GES) - Signed

## 2010-06-23 NOTE — Progress Notes (Signed)
Summary: refill request  Phone Note Call from Patient Call back at Home Phone [phone]   Caller: Patient Call For: Dr. Arlyce Dice Reason for Call: Refill Medication Summary of Call: would like refill of Zanex... [facility] [place], [phone] Initial call taken by: Vallarie Mare,  April 11, 2010 10:12 AM  Follow-up for Phone Call        Called pt to inform med faxed to pharmacy Follow-up by: Merri Ray CMA Duncan Dull),  April 11, 2010 1:05 PM    Prescriptions: ALPRAZOLAM 0.5 MG TABS (ALPRAZOLAM) take one to 2 tabs q.h.s. p.r.n.  #30 x 2   Entered by:   Merri Ray CMA (AAMA)   Authorized by:   Louis Meckel MD   Signed by:   Merri Ray CMA (AAMA) on 04/11/2010   Method used:   Printed then faxed to ...       [facility] (retail)       [address]       [place], [place]  [address]       Ph: [phone]       Fax: [phone]   RxID:   [id number]

## 2010-06-23 NOTE — Progress Notes (Signed)
Summary: Crestor refill needed drug store would not fax  Phone Note Call from Patient Call back at Home Phone [phone]   Caller: Patient Reason for Call: Refill Medication, Talk to Nurse Summary of Call: Needs rx for Crestor 20 mg [facility] [place]   phone  [phone] Initial call taken by: Claudette Laws,  May 18, 2010 2:09 PM  Follow-up for Phone Call        called and informed patient that we had different dose for this med and her PCP was monitoring her cholesterol and liver so it needed to be fowarded to their office. Patient verbalized understanding of plan. Follow-up by: Carlye Grippe,  May 18, 2010 3:35 PM

## 2010-06-23 NOTE — Letter (Signed)
Summary: Generic Engineer, agricultural at [facility] [address]   [place], [place] [address]   Phone: [phone]  Fax: [phone]        January 06, 2010 MRN: [id number]    [name] [address] [place], [place]  [address]    Dear Ms. Decicco,  On  09/27/09, our office requested that you have the following lab test:  fasting lipid panel and liver function test.  Our records indicate that you have not had the above testing done yet.  Please take the enclosed order to [facility] at your earliest convenience.  Reminder:  Nothing to eat or drink after 12 midnight prior to labs.   *** If you do not plan to do this or your primary physician is managing, please contact our office so that we can document appropriately in your chart.           Sincerely,  Hoover Brunette, LPN  This letter has been electronically signed by your physician.

## 2010-06-23 NOTE — Progress Notes (Signed)
Summary: TCT OFFICE VISIT  TCT OFFICE VISIT   Imported By: Zachary George 04/09/2009 15:12:14  _____________________________________________________________________  External Attachment:    Type:   Image     Comment:   External Document

## 2010-06-23 NOTE — Progress Notes (Signed)
Summary: TCT NP CONSULTATION  TCT NP CONSULTATION   Imported By: Zachary George 04/09/2009 15:15:17  _____________________________________________________________________  External Attachment:    Type:   Image     Comment:   External Document

## 2010-06-23 NOTE — Progress Notes (Signed)
Summary: TRIAGE  Phone Note Call from Patient Call back at Work Phone [phone]   Caller: Patient Call For: Arlyce Dice Reason for Call: Talk to Nurse Summary of Call: Patient wants to know if she can get her medication in liquid, she cannot swallow pills. Initial call taken by: Tawni Levy,  July 07, 2009 1:22 PM  Follow-up for Phone Call        Wants to know if she can get Erythromycin in liquid form. I have advised her to call her pharmacy and ask them, I will send script to whatever place can accomadate her request. She will call back with that information. Follow-up by: Laureen Ochs LPN,  July 07, 2009 1:38 PM  Additional Follow-up for Phone Call Additional follow up Details #1::        Script phoned to [facility] in [place]. Erythromycin 250mg  QID AC liquid  30 day supply with 6 refill. Pt. instructed to call back as needed.  Additional Follow-up by: Laureen Ochs LPN,  July 07, 2009 2:35 PM

## 2010-06-23 NOTE — Assessment & Plan Note (Signed)
Summary: CHEST PAIN-JM   Referring Provider:  n/a Primary Provider:  Alfredia Client, MD   History of Present Illness: The patient is a 45 year old female with a history of coronary bypass grafting x4 January 26, 2009. Prior to this the patient had several stent placements. She has had continuous chest pain/chest wall pain after her surgery. She was last seen in May of 2011. Since then, she continues to have chest pain ("pressure", increases with laying in certain positions, palpation; continuous since CABG). Also with increased chest pain for one week; burning component with exertion. No DOE, orthopnea, PND, or pedal edema.  Preventive Screening-Counseling & Management  Alcohol-Tobacco     Smoking Status: quit     Year Quit: 1995  Current Medications (verified): 1)  Aspir-Trin 325 Mg Tbec (Aspirin) .... Take 1 Tablet By Mouth Once A Day 2)  Nitrostat 0.4 Mg Subl (Nitroglycerin) .... Use As Directed 3)  Albuterol Sulfate (2.5 Mg/85ml) 0.083% Nebu (Albuterol Sulfate) .... As Needed 4)  Crestor 10 Mg Tabs (Rosuvastatin Calcium) .... Take 1 Tablet By Mouth Once A Day 5)  Atenolol 25 Mg Tabs (Atenolol) .... One Tablet By Mouth Once Daily 6)  Humalog Mix 75/25 75-25 % Susp (Insulin Lispro Prot & Lispro) .Marland Kitchen.. 10 Units Before Meals 7)  Flovent Hfa 44 Mcg/act Aero (Fluticasone Propionate  Hfa) .... 2 Puffs Twice A Day 8)  Hyomax-Sl 0.125 Mg Subl (Hyoscyamine Sulfate) .... Dissovle Under Tounge Every Four Hours 9)  Prevacid Solutab 30 Mg Tbdp (Lansoprazole) .... Take One Daily 10)  Alprazolam 0.5 Mg Tabs (Alprazolam) .... Take One To 2 Tabs Q.h.s. P.r.n. 11)  Erythromycin Base 250 Mg Cpep (Erythromycin Base) .... Take 1 &1/4 Tsp By Mouth Four Times A Day 12)  Tylenol With Codeine #3 300-30 Mg Tabs (Acetaminophen-Codeine) .... Take 1-2 Tabs Every 6 Hours As Needed Pain  Allergies (verified): 1)  ! Penicillin 2)  ! Metformin  Comments:  Nurse/Medical Assistant: The patient's medications and  allergies were verbally reviewed with the patient and were updated in the Medication and Allergy Lists.  Past History:  Past Medical History: ANXIETY (ICD-300.00) ASTHMA (ICD-493.90) DIABETES MELLITUS (ICD-250.00) Hx of MYOCARDIAL INFARCTION, HX OF (ICD-412)  s/p angioplasty with stents CORONARY ARTERY DISEASE (ICD-414.00) GERD/Esophageal Stricture Postoperative chest pain secondary to muscle spasm  Past Surgical History: PTCA-Stent coronary artery bypass grafting x4 January 26, 2009  Social History: Reviewed history from 06/30/2009 and no changes required. Occupation: Unemployed Patient is a former smoker.  Alcohol Use - no Illicit Drug Use - no Patient gets regular exercise. Daily Caffeine Use  Review of Systems       no fevers or chills, productive cough, hemoptysis, dysphasia, odynophagia, melena, hematochezia, dysuria, hematuria, rash, seizure activity, orthopnea, PND, pedal edema, claudication. Remaining systems are negative.   Vital Signs:  Patient profile:   45 year old female Height:      64 inches Weight:      180 pounds O2 Sat:      99 % on Room air Pulse rate:   72 / minute BP sitting:   108 / 72  (left arm) Cuff size:   large  Vitals Entered By: Carlye Grippe (January 11, 2010 2:01 PM)  O2 Flow:  Room air  Physical Exam  General:  Well-developed well-nourished in no acute distress.  Skin is warm and dry.  HEENT is normal.  Neck is supple. No thyromegaly.  Chest is clear to auscultation with normal expansion. Chest pain worsened with palpation. Cardiovascular exam  is regular rate and rhythm.  Abdominal exam nontender or distended. No masses palpated. Extremities show no edema. neuro grossly intact    EKG  Procedure date:  01/11/2010  Findings:      NSR, nonspecific Twave changes. No significant change since 9/10  Impression & Recommendations:  Problem # 1:  CHEST PAIN (ICD-786.50)  Clearly has muskuloskeletal component; also with  burning component similar to symptoms prior to CABG. Schedule myoview. Her updated medication list for this problem includes:    Aspir-trin 325 Mg Tbec (Aspirin) .Marland Kitchen... Take 1 tablet by mouth once a day    Nitrostat 0.4 Mg Subl (Nitroglycerin) ..... Use as directed    Atenolol 25 Mg Tabs (Atenolol) ..... One tablet by mouth once daily  Orders: T-Basic Metabolic Panel [id number]) T-Lipid Profile [phone]) T-Hepatic Function [phone]) Nuclear Med (Nuc Med)  Problem # 2:  CORONARY ARTERY DISEASE (ICD-414.00)  Continue ASA, atenolol, and statin. Her updated medication list for this problem includes:    Aspir-trin 325 Mg Tbec (Aspirin) .Marland Kitchen... Take 1 tablet by mouth once a day    Nitrostat 0.4 Mg Subl (Nitroglycerin) ..... Use as directed    Atenolol 25 Mg Tabs (Atenolol) ..... One tablet by mouth once daily  Her updated medication list for this problem includes:    Aspir-trin 325 Mg Tbec (Aspirin) .Marland Kitchen... Take 1 tablet by mouth once a day    Nitrostat 0.4 Mg Subl (Nitroglycerin) ..... Use as directed    Atenolol 25 Mg Tabs (Atenolol) ..... One tablet by mouth once daily  Problem # 3:  GERD (ICD-530.81)  Her updated medication list for this problem includes:    Hyomax-sl 0.125 Mg Subl (Hyoscyamine sulfate) .Marland Kitchen... Dissovle under tounge every four hours    Prevacid Solutab 30 Mg Tbdp (Lansoprazole) .Marland Kitchen... Take one daily  Her updated medication list for this problem includes:    Hyomax-sl 0.125 Mg Subl (Hyoscyamine sulfate) .Marland Kitchen... Dissovle under tounge every four hours    Prevacid Solutab 30 Mg Tbdp (Lansoprazole) .Marland Kitchen... Take one daily  Problem # 4:  DIABETES MELLITUS (ICD-250.00)  Her updated medication list for this problem includes:    Aspir-trin 325 Mg Tbec (Aspirin) .Marland Kitchen... Take 1 tablet by mouth once a day    Humalog Mix 75/25 75-25 % Susp (Insulin lispro prot & lispro) .Marland KitchenMarland KitchenMarland KitchenMarland Kitchen 10 units before meals  Her updated medication list for this problem includes:    Aspir-trin 325 Mg Tbec  (Aspirin) .Marland Kitchen... Take 1 tablet by mouth once a day    Humalog Mix 75/25 75-25 % Susp (Insulin lispro prot & lispro) .Marland KitchenMarland KitchenMarland KitchenMarland Kitchen 10 units before meals  Patient Instructions: 1)  Your physician has requested that you have an Best boy.  For further information please visit [email or web address]  Please follow instruction sheet, as given. 2)  Your physician recommends that you go to the [facility] for lab work: Do not eat or drink after midnight.  3)  Follow up with Gene/Dr. Earnestine Leys on Monday, February 21, 2010 at 1pm.

## 2010-06-23 NOTE — Progress Notes (Signed)
Summary: ?? need premed  Phone Note Call from Patient   Summary of Call: Patient scheduled to have tooth extracted by Dr. Hewitt Blade in [place] tomorrow and wants to know if she needs premed.  Informed her that it does not look like she will need, but will run by PA so we will be sure.  Advised pt to call dentist to see if they want her to hold her ASA.  Will depend on dentist comfort level as some will do extractions while on med.  Patient verbalized understanding.  Hoover Brunette, LPN  January 26, 2010 10:26 AM   Follow-up for Phone Call        agreed...she does not need premedication Follow-up by: Nelida Meuse, PA-C,  January 26, 2010 5:10 PM  Additional Follow-up for Phone Call Additional follow up Details #1::        Patient notified via voicemail.   Hoover Brunette, LPN  January 27, 2010 11:07 AM

## 2010-06-23 NOTE — Miscellaneous (Signed)
  Clinical Lists Changes  Medications: Changed medication from ERY-TAB 250 MG TBEC (ERYTHROMYCIN BASE) Take 1 tab 30 minutes before breakfast, lunch, dinner and at bedtime. to E.E.S. GRANULES 200 MG/5ML SUSR (ERYTHROMYCIN ETHYLSUCCINATE) 1 1/4 tsp. every 4 hours 30 min before meals and at bedtime

## 2010-06-23 NOTE — Assessment & Plan Note (Signed)
Summary: F/U FROM GES/RS   History of Present Illness Visit Type: Follow-up Visit Primary GI MD: Melvia Heaps MD [facility] Primary Provider: Alfredia Client, MD Requesting Provider: n/a Chief Complaint: Patient here to follow up on her GES. She complains of severe nausea without vomiting. She has some solid food dysphagia and states that she is only eating baby food at this point.  History of Present Illness:   [name] has returned for followup of her nausea.  Postprandial nausea continues to be a problem.  She is losing weight.  She has been suffering anxiety because of her swallowing difficulties and because of her nausea.  Esophageal dilatation has afforded transient relief.  She continues to have dysphagia to solids and occasionally to liquids.  Gastric emptying scan was normal.   GI Review of Systems    Reports abdominal pain, dysphagia with solids, and  nausea.     Location of  Abdominal pain: epigastric area.    Denies acid reflux, belching, bloating, chest pain, dysphagia with liquids, heartburn, loss of appetite, vomiting, vomiting blood, weight loss, and  weight gain.        Denies anal fissure, black tarry stools, change in bowel habit, constipation, diarrhea, diverticulosis, fecal incontinence, heme positive stool, hemorrhoids, irritable bowel syndrome, jaundice, light color stool, liver problems, rectal bleeding, and  rectal pain. Cardiovascular Risk History:      Positive major cardiovascular risk factors include diabetes.  Negative major cardiovascular risk factors include female age less than 36 years old and non-tobacco-user status.        Positive history for target organ damage include ASHD (either angina; prior MI; prior CABG).      Current Medications (verified): 1)  Aspirin 81 Mg  Tbec (Aspirin) .... Take Three By Mouth Every Day 2)  Nitro-Dur 0.4 Mg/hr  Pt24 (Nitroglycerin) .... As Needed 3)  Albuterol Sulfate (2.5 Mg/62ml) 0.083% Nebu (Albuterol Sulfate) .... As  Needed 4)  Crestor 10 Mg Tabs (Rosuvastatin Calcium) .... Take 1 Tablet By Mouth Once A Day 5)  Atenolol 25 Mg Tabs (Atenolol) .... One Tablet By Mouth Once Daily 6)  Lorazepam 0.5 Mg Tabs (Lorazepam) .... Take 1 Tablet By Mouth Once A Day 7)  Humalog Mix 75/25 75-25 % Susp (Insulin Lispro Prot & Lispro) .Marland Kitchen.. 10 Units Before Meals 8)  Flovent Hfa 44 Mcg/act Aero (Fluticasone Propionate  Hfa) .... 2 Puffs Twice A Day 9)  Hyomax-Sl 0.125 Mg Subl (Hyoscyamine Sulfate) .... Dissovle Under Tounge Every Four Hours 10)  Emetrol 1.87-1.87-21.5 Soln (Fructose-Dextrose-Phosphor Acd) .... As Needed For Nausea 11)  Prevacid Solutab 30 Mg Tbdp (Lansoprazole) .... Take One Daily  Allergies (verified): 1)  ! Penicillin 2)  ! Metformin  Past History:  Past Medical History: Reviewed history from 05/13/2009 and no changes required. ANXIETY (ICD-300.00) ASTHMA (ICD-493.90) DIABETES MELLITUS (ICD-250.00) Hx of MYOCARDIAL INFARCTION, HX OF (ICD-412)  s/p angioplasty with stents CORONARY ARTERY DISEASE (ICD-414.00) GERD/Esophageal Stricture coronary artery bypass grafting x4 January 26, 2009 Postoperative chest pain secondary to muscle spasm  Past Surgical History: Reviewed history from 04/01/2009 and no changes required. PTCA-Stent Open Heart Surgery--Bypass   Family History: Family History of Diabetes: mother, maternal aunt, paternal uncles, paternal aunts Family History of Heart Disease: father, paternal uncles No FH of Colon Cancer  Social History: Occupation: Unemployed Patient is a former smoker.  Alcohol Use - no Illicit Drug Use - no Patient gets regular exercise. Daily Caffeine Use  Review of Systems       The  patient complains of arthritis/joint pain.  The patient denies allergy/sinus, anemia, anxiety-new, back pain, blood in urine, breast changes/lumps, change in vision, confusion, cough, coughing up blood, depression-new, fainting, fatigue, fever, headaches-new, hearing  problems, heart murmur, heart rhythm changes, itching, menstrual pain, muscle pains/cramps, night sweats, nosebleeds, pregnancy symptoms, shortness of breath, skin rash, sleeping problems, sore throat, swelling of feet/legs, swollen lymph glands, thirst - excessive , urination - excessive , urination changes/pain, urine leakage, vision changes, and voice change.    Vital Signs:  Patient profile:   45 year old female Height:      64 inches Weight:      158.8 pounds BMI:     27.36 Pulse rate:   76 / minute Pulse rhythm:   regular BP sitting:   108 / 66  (left arm) Cuff size:   regular  Vitals Entered By: Harlow Mares CMA Duncan Dull) (June 30, 2009 10:07 AM)  Physical Exam  Additional Exam:  On physical exam she is a well-developed well-nourished female  Abdominal exam there are no abnormal masses, tenderness organomegaly; there is no succussion splash   Impression & Recommendations:  Problem # 1:  NAUSEA WITH VOMITING (ICD-787.01) Symptoms certainly suggest gastroparesis though this was not confirmed by gastric emptying scan.  A esophageal motility disorder is another consideration.  Recommendations #1Metozolv 10mg  ac and HS (pt has difficulty swallowing pills)  Problem # 2:  DYSPHAGIA (JXB-147.82) Patient has had transient improvement with dilatation therapy.  Esophageal motility disorder is a possibility.  Recommendations #1 to consider Botox injection of the LES if symptoms are not significantly improved with empiric therapy with metoclopramide  Problem # 3:  CORONARY ARTERY DISEASE (ICD-414.00) Assessment: Comment Only  Cardiovascular Risk Assessment/Plan:      The patient's hypertensive risk group is category C: Target organ damage and/or diabetes.  Today's blood pressure is 108/66.      Patient Instructions: 1)  CC Dr. Molly Maduro Day 2)  We have  given you a printed prescription 3)  We have given you samples of Metozolv 4)  Call back in 5 days to report progress 5)   The medication list was reviewed and reconciled.  All changed / newly prescribed medications were explained.  A complete medication list was provided to the patient / caregiver. Prescriptions: ALPRAZOLAM 0.5 MG TABS (ALPRAZOLAM) take one to 2 tabs q.h.s. p.r.n.  #25 x 2   Entered and Authorized by:   Louis Meckel MD   Signed by:   Louis Meckel MD on 06/30/2009   Method used:   Print then Give to Patient   RxID:   [id number] METOZOLV ODT 10 MG TBDP (METOCLOPRAMIDE HCL) take 1 tab s.l. 1/2 hr ac and hs  #28 x 7   Entered and Authorized by:   Louis Meckel MD   Signed by:   Louis Meckel MD on 06/30/2009   Method used:   Electronically to        [facility]* (retail)       [address]       [place], [place]  [address]       Ph: [phone]       Fax: [phone]   RxID:   [id number]

## 2010-06-23 NOTE — Medication Information (Signed)
Summary: Lansoprazole & EES Granules/[facility]  Lansoprazole & EES Granules/[facility]   Imported By: Sherian Rein 08/06/2009 15:08:25  _____________________________________________________________________  External Attachment:    Type:   Image     Comment:   External Document

## 2010-06-23 NOTE — Letter (Signed)
Summary: Generic Engineer, agricultural at [facility] [address]   [place], [place] [address]   Phone: [phone]  Fax: [phone]        February 15, 2010 MRN: [id number]    [name] [address] [place], [place]  [address]    Dear Ms. [name],    You were asked to have lab work done following your office visit on August 23rd. It does not appear this lab work has been completed.   Please, take the enclosed order to the [facility] ASAP to have this done. Your labs need to be done before your follow up appointment with our PA on Oct 3rd. Do not eat or drink after midnight.      Sincerely,  Cyril Loosen, RN, BSN  This letter has been electronically signed by your physician.

## 2010-06-23 NOTE — Letter (Signed)
Summary: Lexiscan or Dobutamine Pharmacist, community at [facility]  [address]   [place], [place] [address]   Phone: [phone]  Fax: [phone]      [facility]  Lexiscan or Dobutamine Cardiolite Strss Test    [facility]  Appointment Date:_  Appointment Time:_  Your doctor has ordered a CARDIOLITE STRESS TEST using a medication to stimulate exercise so that you will not have to walk on the treadmill to determine the condition of your heart during stress. If you take blood pressure medication, ask your doctor if you should take it the day of your test. You should not have anything to eat or drink at least 4 hours before your test is scheduled, and no caffeine, including decaffeinated tea and coffee, chocolate, and soft drinks for 24 hours before your test.  You will need to register at the Outpatient/Main Entrance at the hospital 15 minutes before your appointment time. It is a good idea to bring a copy of your order with you. They will direct you to the Diagnostic Imaging (Radiology) Department.  You will be asked to undress from the waist up and given a hospital gown to wear, so dress comfortably from the waist down for example: Sweat pants, shorts, or skirt Rubber soled lace up shoes (tennis shoes)  Plan on about three hours from registration to release from the hospital  You may take all of your medication with water the morning of your test. You may want to hold your diabetic medication if you do not eat breakfast.   If the results of your test are normal or stable, you will receive a letter. If they are abnormal, the nurse will contact you by phone.

## 2010-06-23 NOTE — Progress Notes (Signed)
Summary: Medication refill  Phone Note Call from Patient Call back at Home Phone [phone]   Caller: Patient Call For: Dr. Arlyce Dice Reason for Call: Refill Medication Summary of Call: Needs [facility]ill on [facility]ax[facility] [place] Initial call taken by: Karna Christmas,  December 07, 2009 11:14 AM  Follow-up for Phone Call        Faxed rx to pharmacy and called pt. Follow-up by: Merri Ray CMA Duncan Dull),  December 07, 2009 1:55 PM    Prescriptions: ALPRAZOLAM 0.5 MG TABS (ALPRAZOLAM) take one to 2 tabs q.h.s. p.r.n.  #30 x 2   Entered by:   Merri Ray CMA (AAMA)   Authorized by:   Louis Meckel MD   Signed by:   Merri Ray CMA (AAMA) on 12/07/2009   Method used:   Printed then faxed to ...       [facility] (retail)       [address]       [place], [place]  [address]       Ph: [phone]       Fax: [phone]   RxID:   [id number]

## 2010-06-27 ENCOUNTER — Telehealth: Payer: Self-pay | Admitting: Gastroenterology

## 2010-06-29 ENCOUNTER — Encounter: Payer: Self-pay | Admitting: Gastroenterology

## 2010-07-07 NOTE — Progress Notes (Signed)
Summary: Medication Auth  Phone Note Call from Patient Call back at Home Phone [phone]   Caller: Patient Call For: Dr. Arlyce Dice Reason for Call: Talk to Nurse Summary of Call: Pt needs refill but her insurance company will not pay for it and she needs [place] to contact her insurance to tell them why patients needs to be on these medications   [phone] Initial call taken by: [place] Johnson,  June 27, 2010 9:14 AM  Follow-up for Phone Call        Called and requested the Prior Auth forms be sent for the patient. She needs the rx for xanax and erythromycin. [employer] is to fax [place] the Prior auth forms to [phone]. Follow-up by: Selinda Michaels RN,  June 27, 2010 1:15 PM     Appended Document: Medication Auth Faxed in prior auth forms for xanax and erythromycin prescriptions.

## 2010-07-08 ENCOUNTER — Telehealth: Payer: Self-pay | Admitting: Gastroenterology

## 2010-07-13 ENCOUNTER — Encounter: Payer: Self-pay | Admitting: Gastroenterology

## 2010-07-13 NOTE — Progress Notes (Signed)
Summary: Medication refill  Phone Note Call from Patient Call back at Home Phone [phone]   Caller: Patient Call For: Dr. Arlyce Dice Reason for Call: Refill Medication Summary of Call: Needs a refill on her Xanax.[facility] [phone] Initial call taken by: Karna Christmas,  July 08, 2010 11:33 AM  Follow-up for Phone Call        Called pt to inform that I called in [facility].. Also informed pt that next month will be a year since she has been seen in the  office. She needs to call and schedule a follow up appointment to continue her Xanax refills Follow-up by: Merri Ray CMA Duncan Dull),  July 08, 2010 2:32 PM  Additional Follow-up for Phone Call Additional follow up Details #1::        ok Additional Follow-up by: Louis Meckel MD,  July 08, 2010 3:23 PM

## 2010-07-19 NOTE — Medication Information (Signed)
Summary: Xanax denied/[employer]  Xanax denied/[employer]   Imported By: Sherian Rein 07/15/2010 12:47:50  _____________________________________________________________________  External Attachment:    Type:   Image     Comment:   External Document

## 2010-08-02 NOTE — Medication Information (Signed)
Summary: Approved EES Granules / [facility]  Approved EES Granules / [facility]   Imported By: Lennie Odor 07/25/2010 12:07:32  _____________________________________________________________________  External Attachment:    Type:   Image     Comment:   External Document

## 2010-08-10 DIAGNOSIS — E559 Vitamin D deficiency, unspecified: Secondary | ICD-10-CM | POA: Insufficient documentation

## 2010-08-22 ENCOUNTER — Other Ambulatory Visit: Payer: Self-pay | Admitting: Gastroenterology

## 2010-08-22 MED ORDER — ALPRAZOLAM 0.5 MG PO TABS: 0.5000 mg | ORAL_TABLET | Freq: Every evening | ORAL | Status: DC | PRN

## 2010-08-22 NOTE — Telephone Encounter (Signed)
Pt called needed refill on xanax

## 2010-08-23 ENCOUNTER — Other Ambulatory Visit: Payer: Self-pay | Admitting: Gastroenterology

## 2010-08-23 NOTE — Telephone Encounter (Signed)
Called pt to inform Med sent twice..Faxed over to [facility]

## 2010-08-24 LAB — GLUCOSE, CAPILLARY

## 2010-08-25 LAB — GLUCOSE, CAPILLARY

## 2010-08-26 LAB — BASIC METABOLIC PANEL
BUN: 8 mg/dL (ref 6–23)
BUN: 4 mg/dL — ABNORMAL LOW (ref 6–23)
BUN: 5 mg/dL — ABNORMAL LOW (ref 6–23)
BUN: 5 mg/dL — ABNORMAL LOW (ref 6–23)
BUN: 6 mg/dL (ref 6–23)
CO2: 24 mEq/L (ref 19–32)
CO2: 26 mEq/L (ref 19–32)
CO2: 27 mEq/L (ref 19–32)
CO2: 29 mEq/L (ref 19–32)
Calcium: 9.5 mg/dL (ref 8.4–10.5)
Calcium: 7.2 mg/dL — ABNORMAL LOW (ref 8.4–10.5)
Calcium: 7.3 mg/dL — ABNORMAL LOW (ref 8.4–10.5)
Calcium: 7.9 mg/dL — ABNORMAL LOW (ref 8.4–10.5)
Calcium: 8.4 mg/dL (ref 8.4–10.5)
Calcium: 9.5 mg/dL (ref 8.4–10.5)
Chloride: 104 mEq/L (ref 96–112)
Chloride: 105 mEq/L (ref 96–112)
Chloride: 108 mEq/L (ref 96–112)
Chloride: 110 mEq/L (ref 96–112)
Creatinine, Ser: 0.51 mg/dL (ref 0.4–1.2)
Creatinine, Ser: 0.56 mg/dL (ref 0.4–1.2)
Creatinine, Ser: 0.56 mg/dL (ref 0.4–1.2)
Creatinine, Ser: 0.59 mg/dL (ref 0.4–1.2)
GFR calc Af Amer: >60 mL/min (ref >60)
GFR calc Af Amer: >60 mL/min (ref >60)
GFR calc Af Amer: >60 mL/min (ref >60)
GFR calc Af Amer: >60 mL/min (ref >60)
GFR calc Af Amer: >60 mL/min (ref >60)
GFR calc non Af Amer: >60 mL/min (ref >60)
GFR calc non Af Amer: >60 mL/min (ref >60)
GFR calc non Af Amer: >60 mL/min (ref >60)
GFR calc non Af Amer: >60 mL/min (ref >60)
GFR calc non Af Amer: >60 mL/min (ref >60)
GFR calc non Af Amer: >60 mL/min (ref >60)
Glucose, Bld: 286 mg/dL — ABNORMAL HIGH (ref 70–99)
Glucose, Bld: 109 mg/dL — ABNORMAL HIGH (ref 70–99)
Glucose, Bld: 132 mg/dL — ABNORMAL HIGH (ref 70–99)
Glucose, Bld: 155 mg/dL — ABNORMAL HIGH (ref 70–99)
Glucose, Bld: 180 mg/dL — ABNORMAL HIGH (ref 70–99)
Glucose, Bld: 62 mg/dL — ABNORMAL LOW (ref 70–99)
Potassium: 3.9 mEq/L (ref 3.5–5.1)
Potassium: 2.8 mEq/L — ABNORMAL LOW (ref 3.5–5.1)
Potassium: 3.3 mEq/L — ABNORMAL LOW (ref 3.5–5.1)
Potassium: 3.8 mEq/L (ref 3.5–5.1)
Potassium: 3.8 mEq/L (ref 3.5–5.1)
Potassium: 3.9 mEq/L (ref 3.5–5.1)
Sodium: 136 mEq/L (ref 135–145)
Sodium: 136 mEq/L (ref 135–145)
Sodium: 138 mEq/L (ref 135–145)
Sodium: 139 mEq/L (ref 135–145)
Sodium: 140 mEq/L (ref 135–145)
Sodium: 141 mEq/L (ref 135–145)

## 2010-08-26 LAB — PREGNANCY, URINE: Preg Test, Ur: NEGATIVE

## 2010-08-26 LAB — GLUCOSE, CAPILLARY
Glucose-Capillary: 113 mg/dL — ABNORMAL HIGH (ref 70–99)
Glucose-Capillary: 118 mg/dL — ABNORMAL HIGH (ref 70–99)
Glucose-Capillary: 142 mg/dL — ABNORMAL HIGH (ref 70–99)
Glucose-Capillary: 149 mg/dL — ABNORMAL HIGH (ref 70–99)
Glucose-Capillary: 160 mg/dL — ABNORMAL HIGH (ref 70–99)
Glucose-Capillary: 167 mg/dL — ABNORMAL HIGH (ref 70–99)
Glucose-Capillary: 168 mg/dL — ABNORMAL HIGH (ref 70–99)
Glucose-Capillary: 197 mg/dL — ABNORMAL HIGH (ref 70–99)
Glucose-Capillary: 219 mg/dL — ABNORMAL HIGH (ref 70–99)
Glucose-Capillary: 97 mg/dL (ref 70–99)
Glucose-Capillary: 105 mg/dL — ABNORMAL HIGH (ref 70–99)
Glucose-Capillary: 117 mg/dL — ABNORMAL HIGH (ref 70–99)
Glucose-Capillary: 119 mg/dL — ABNORMAL HIGH (ref 70–99)
Glucose-Capillary: 137 mg/dL — ABNORMAL HIGH (ref 70–99)
Glucose-Capillary: 138 mg/dL — ABNORMAL HIGH (ref 70–99)
Glucose-Capillary: 142 mg/dL — ABNORMAL HIGH (ref 70–99)
Glucose-Capillary: 146 mg/dL — ABNORMAL HIGH (ref 70–99)
Glucose-Capillary: 146 mg/dL — ABNORMAL HIGH (ref 70–99)
Glucose-Capillary: 159 mg/dL — ABNORMAL HIGH (ref 70–99)
Glucose-Capillary: 162 mg/dL — ABNORMAL HIGH (ref 70–99)
Glucose-Capillary: 167 mg/dL — ABNORMAL HIGH (ref 70–99)
Glucose-Capillary: 176 mg/dL — ABNORMAL HIGH (ref 70–99)
Glucose-Capillary: 179 mg/dL — ABNORMAL HIGH (ref 70–99)
Glucose-Capillary: 179 mg/dL — ABNORMAL HIGH (ref 70–99)
Glucose-Capillary: 184 mg/dL — ABNORMAL HIGH (ref 70–99)
Glucose-Capillary: 186 mg/dL — ABNORMAL HIGH (ref 70–99)
Glucose-Capillary: 207 mg/dL — ABNORMAL HIGH (ref 70–99)
Glucose-Capillary: 234 mg/dL — ABNORMAL HIGH (ref 70–99)
Glucose-Capillary: 257 mg/dL — ABNORMAL HIGH (ref 70–99)
Glucose-Capillary: 160 mg/dL — ABNORMAL HIGH (ref 70–99)

## 2010-08-26 LAB — CBC
HCT: 31.8 % — ABNORMAL LOW (ref 36.0–46.0)
HCT: 22.4 % — ABNORMAL LOW (ref 36.0–46.0)
HCT: 24.1 % — ABNORMAL LOW (ref 36.0–46.0)
HCT: 24.9 % — ABNORMAL LOW (ref 36.0–46.0)
HCT: 25.8 % — ABNORMAL LOW (ref 36.0–46.0)
HCT: 26.2 % — ABNORMAL LOW (ref 36.0–46.0)
HCT: 26.3 % — ABNORMAL LOW (ref 36.0–46.0)
HCT: 26.3 % — ABNORMAL LOW (ref 36.0–46.0)
HCT: 34.7 % — ABNORMAL LOW (ref 36.0–46.0)
HCT: 36.4 % (ref 36.0–46.0)
HCT: 37.8 % (ref 36.0–46.0)
HCT: 41.3 % (ref 36.0–46.0)
Hemoglobin: 10.6 g/dL — ABNORMAL LOW (ref 12.0–15.0)
Hemoglobin: 11.5 g/dL — ABNORMAL LOW (ref 12.0–15.0)
Hemoglobin: 11.6 g/dL — ABNORMAL LOW (ref 12.0–15.0)
Hemoglobin: 11.8 g/dL — ABNORMAL LOW (ref 12.0–15.0)
Hemoglobin: 12.3 g/dL (ref 12.0–15.0)
Hemoglobin: 7.4 g/dL — CL (ref 12.0–15.0)
Hemoglobin: 8.1 g/dL — ABNORMAL LOW (ref 12.0–15.0)
Hemoglobin: 8.4 g/dL — ABNORMAL LOW (ref 12.0–15.0)
Hemoglobin: 8.7 g/dL — ABNORMAL LOW (ref 12.0–15.0)
Hemoglobin: 8.8 g/dL — ABNORMAL LOW (ref 12.0–15.0)
Hemoglobin: 8.9 g/dL — ABNORMAL LOW (ref 12.0–15.0)
Hemoglobin: 9.1 g/dL — ABNORMAL LOW (ref 12.0–15.0)
Hemoglobin: 13.8 g/dL (ref 12.0–15.0)
MCHC: 33.1 g/dL (ref 30.0–36.0)
MCHC: 33.2 g/dL (ref 30.0–36.0)
MCHC: 33.3 g/dL (ref 30.0–36.0)
MCHC: 33.5 g/dL (ref 30.0–36.0)
MCHC: 33.8 g/dL (ref 30.0–36.0)
MCHC: 33.8 g/dL (ref 30.0–36.0)
MCHC: 34.1 g/dL (ref 30.0–36.0)
MCHC: 34.4 g/dL (ref 30.0–36.0)
MCV: 85.2 fL (ref 78.0–100.0)
MCV: 86.5 fL (ref 78.0–100.0)
MCV: 86.5 fL (ref 78.0–100.0)
MCV: 86.6 fL (ref 78.0–100.0)
MCV: 86.7 fL (ref 78.0–100.0)
MCV: 87.1 fL (ref 78.0–100.0)
MCV: 88.1 fL (ref 78.0–100.0)
MCV: 88.4 fL (ref 78.0–100.0)
Platelets: 384 10*3/uL (ref 150–400)
Platelets: 466 10*3/uL — ABNORMAL HIGH (ref 150–400)
Platelets: 173 10*3/uL (ref 150–400)
Platelets: 182 10*3/uL (ref 150–400)
Platelets: 196 10*3/uL (ref 150–400)
Platelets: 199 10*3/uL (ref 150–400)
Platelets: 218 10*3/uL (ref 150–400)
Platelets: 219 10*3/uL (ref 150–400)
Platelets: 236 10*3/uL (ref 150–400)
Platelets: 285 10*3/uL (ref 150–400)
Platelets: 313 10*3/uL (ref 150–400)
RBC: 3.4 MIL/uL — ABNORMAL LOW (ref 3.87–5.11)
RBC: 2.58 MIL/uL — ABNORMAL LOW (ref 3.87–5.11)
RBC: 2.79 MIL/uL — ABNORMAL LOW (ref 3.87–5.11)
RBC: 2.83 MIL/uL — ABNORMAL LOW (ref 3.87–5.11)
RBC: 2.97 MIL/uL — ABNORMAL LOW (ref 3.87–5.11)
RBC: 2.97 MIL/uL — ABNORMAL LOW (ref 3.87–5.11)
RBC: 3.03 MIL/uL — ABNORMAL LOW (ref 3.87–5.11)
RBC: 3.09 MIL/uL — ABNORMAL LOW (ref 3.87–5.11)
RBC: 3.99 MIL/uL (ref 3.87–5.11)
RBC: 4.07 MIL/uL (ref 3.87–5.11)
RBC: 4.25 MIL/uL (ref 3.87–5.11)
RBC: 4.83 MIL/uL (ref 3.87–5.11)
RDW: 14.9 % (ref 11.5–15.5)
RDW: 13.8 % (ref 11.5–15.5)
RDW: 14 % (ref 11.5–15.5)
RDW: 14.1 % (ref 11.5–15.5)
RDW: 14.2 % (ref 11.5–15.5)
RDW: 14.2 % (ref 11.5–15.5)
RDW: 14.4 % (ref 11.5–15.5)
RDW: 14.4 % (ref 11.5–15.5)
RDW: 14.9 % (ref 11.5–15.5)
RDW: 15.1 % (ref 11.5–15.5)
RDW: 15.2 % (ref 11.5–15.5)
RDW: 15.3 % (ref 11.5–15.5)
WBC: 12.8 10*3/uL — ABNORMAL HIGH (ref 4.0–10.5)
WBC: 9.6 10*3/uL (ref 4.0–10.5)
WBC: 10.5 10*3/uL (ref 4.0–10.5)
WBC: 10.8 10*3/uL — ABNORMAL HIGH (ref 4.0–10.5)
WBC: 11.8 10*3/uL — ABNORMAL HIGH (ref 4.0–10.5)
WBC: 15.4 10*3/uL — ABNORMAL HIGH (ref 4.0–10.5)
WBC: 15.5 10*3/uL — ABNORMAL HIGH (ref 4.0–10.5)
WBC: 16.5 10*3/uL — ABNORMAL HIGH (ref 4.0–10.5)
WBC: 7.6 10*3/uL (ref 4.0–10.5)
WBC: 8 10*3/uL (ref 4.0–10.5)
WBC: 8.3 10*3/uL (ref 4.0–10.5)
WBC: 8.4 10*3/uL (ref 4.0–10.5)
WBC: 8.8 10*3/uL (ref 4.0–10.5)

## 2010-08-26 LAB — DIFFERENTIAL
Basophils Absolute: 0 10*3/uL (ref 0.0–0.1)
Basophils Absolute: 0.1 10*3/uL (ref 0.0–0.1)
Basophils Relative: 1 % (ref 0–1)
Eosinophils Absolute: 0.1 10*3/uL (ref 0.0–0.7)
Lymphocytes Relative: 12 % (ref 12–46)
Lymphocytes Relative: 26 % (ref 12–46)
Lymphs Abs: 1.2 10*3/uL (ref 0.7–4.0)
Lymphs Abs: 3.3 10*3/uL (ref 0.7–4.0)
Monocytes Absolute: 0.3 10*3/uL (ref 0.1–1.0)
Monocytes Relative: 6 % (ref 3–12)
Neutro Abs: 8.3 10*3/uL — ABNORMAL HIGH (ref 1.7–7.7)
Neutro Abs: 8.6 10*3/uL — ABNORMAL HIGH (ref 1.7–7.7)
Neutro Abs: 5.7 10*3/uL (ref 1.7–7.7)
Neutrophils Relative %: 67 % (ref 43–77)
Neutrophils Relative %: 65 % (ref 43–77)

## 2010-08-26 LAB — CREATININE, SERUM
Creatinine, Ser: 0.56 mg/dL (ref 0.4–1.2)
Creatinine, Ser: 0.64 mg/dL (ref 0.4–1.2)
GFR calc Af Amer: >60 mL/min (ref >60)
GFR calc Af Amer: >60 mL/min (ref >60)
GFR calc non Af Amer: >60 mL/min (ref >60)
GFR calc non Af Amer: >60 mL/min (ref >60)

## 2010-08-26 LAB — MAGNESIUM
Magnesium: 2.2 mg/dL (ref 1.5–2.5)
Magnesium: 2.3 mg/dL (ref 1.5–2.5)
Magnesium: 2.6 mg/dL — ABNORMAL HIGH (ref 1.5–2.5)

## 2010-08-26 LAB — COMPREHENSIVE METABOLIC PANEL
ALT: 17 U/L (ref 0–35)
ALT: 17 U/L (ref 0–35)
ALT: 19 U/L (ref 0–35)
AST: 20 U/L (ref 0–37)
Albumin: 3 g/dL — ABNORMAL LOW (ref 3.5–5.2)
Alkaline Phosphatase: 76 U/L (ref 39–117)
Alkaline Phosphatase: 85 U/L (ref 39–117)
Alkaline Phosphatase: 86 U/L (ref 39–117)
BUN: 2 mg/dL — ABNORMAL LOW (ref 6–23)
BUN: 5 mg/dL — ABNORMAL LOW (ref 6–23)
CO2: 25 mEq/L (ref 19–32)
Calcium: 8.9 mg/dL (ref 8.4–10.5)
Chloride: 108 mEq/L (ref 96–112)
Chloride: 103 mEq/L (ref 96–112)
Creatinine, Ser: 0.65 mg/dL (ref 0.4–1.2)
GFR calc Af Amer: >60 mL/min (ref >60)
GFR calc non Af Amer: >60 mL/min (ref >60)
Glucose, Bld: 165 mg/dL — ABNORMAL HIGH (ref 70–99)
Glucose, Bld: 173 mg/dL — ABNORMAL HIGH (ref 70–99)
Glucose, Bld: 226 mg/dL — ABNORMAL HIGH (ref 70–99)
Potassium: 2.9 mEq/L — ABNORMAL LOW (ref 3.5–5.1)
Potassium: 3.7 mEq/L (ref 3.5–5.1)
Potassium: 4.2 mEq/L (ref 3.5–5.1)
Sodium: 140 mEq/L (ref 135–145)
Sodium: 140 mEq/L (ref 135–145)
Sodium: 136 mEq/L (ref 135–145)
Total Bilirubin: 0.5 mg/dL (ref 0.3–1.2)
Total Bilirubin: 0.4 mg/dL (ref 0.3–1.2)
Total Protein: 5.8 g/dL — ABNORMAL LOW (ref 6.0–8.3)
Total Protein: 6.1 g/dL (ref 6.0–8.3)

## 2010-08-26 LAB — POCT I-STAT 3, ART BLOOD GAS (G3+)
Acid-base deficit: 2 mmol/L (ref 0.0–2.0)
Acid-base deficit: 4 mmol/L — ABNORMAL HIGH (ref 0.0–2.0)
Bicarbonate: 21.4 mEq/L (ref 20.0–24.0)
Bicarbonate: 21.8 mEq/L (ref 20.0–24.0)
Bicarbonate: 22 mEq/L (ref 20.0–24.0)
Bicarbonate: 22.2 mEq/L (ref 20.0–24.0)
Bicarbonate: 25.3 mEq/L — ABNORMAL HIGH (ref 20.0–24.0)
O2 Saturation: 99 %
O2 Saturation: 99 %
Patient temperature: 37
TCO2: 23 mmol/L (ref 0–100)
TCO2: 23 mmol/L (ref 0–100)
TCO2: 27 mmol/L (ref 0–100)
pCO2 arterial: 38.1 mmHg (ref 35.0–45.0)
pCO2 arterial: 39.4 mmHg (ref 35.0–45.0)
pCO2 arterial: 39.6 mmHg (ref 35.0–45.0)
pH, Arterial: 7.357 (ref 7.350–7.400)
pH, Arterial: 7.359 (ref 7.350–7.400)
pH, Arterial: 7.416 — ABNORMAL HIGH (ref 7.350–7.400)
pH, Arterial: 7.44 — ABNORMAL HIGH (ref 7.350–7.400)
pO2, Arterial: 132 mmHg — ABNORMAL HIGH (ref 80.0–100.0)
pO2, Arterial: 134 mmHg — ABNORMAL HIGH (ref 80.0–100.0)
pO2, Arterial: 368 mmHg — ABNORMAL HIGH (ref 80.0–100.0)

## 2010-08-26 LAB — POCT I-STAT, CHEM 8
BUN: 5 mg/dL — ABNORMAL LOW (ref 6–23)
Calcium, Ion: 1.06 mmol/L — ABNORMAL LOW (ref 1.12–1.32)
Calcium, Ion: 1.11 mmol/L — ABNORMAL LOW (ref 1.12–1.32)
Chloride: 103 mEq/L (ref 96–112)
Chloride: 107 mEq/L (ref 96–112)
Creatinine, Ser: 0.4 mg/dL (ref 0.4–1.2)
Creatinine, Ser: 0.6 mg/dL (ref 0.4–1.2)
Glucose, Bld: 226 mg/dL — ABNORMAL HIGH (ref 70–99)
Potassium: 4.8 mEq/L (ref 3.5–5.1)

## 2010-08-26 LAB — CROSSMATCH
ABO/RH(D): A POS
Antibody Screen: NEGATIVE

## 2010-08-26 LAB — CK TOTAL AND CKMB (NOT AT ARMC)
CK, MB: 0.5 ng/mL (ref 0.3–4.0)
Relative Index: INVALID (ref 0.0–2.5)
Relative Index: INVALID (ref 0.0–2.5)
Total CK: 14 U/L (ref 7–177)
Total CK: 15 U/L (ref 7–177)

## 2010-08-26 LAB — TSH: TSH: 7.471 u[IU]/mL — ABNORMAL HIGH (ref 0.350–4.500)

## 2010-08-26 LAB — BLOOD GAS, ARTERIAL
Acid-base deficit: 2.4 mmol/L — ABNORMAL HIGH (ref 0.0–2.0)
Bicarbonate: 21.2 mEq/L (ref 20.0–24.0)
FIO2: 0.36 %
O2 Saturation: 99.9 %
Patient temperature: 98.6
TCO2: 22.2 mmol/L (ref 0–100)
pCO2 arterial: 32.1 mmHg — ABNORMAL LOW (ref 35.0–45.0)
pH, Arterial: 7.435 — ABNORMAL HIGH (ref 7.350–7.400)
pO2, Arterial: 198 mmHg — ABNORMAL HIGH (ref 80.0–100.0)

## 2010-08-26 LAB — LIPID PANEL
Cholesterol: 185 mg/dL (ref 0–200)
HDL: 34 mg/dL — ABNORMAL LOW (ref >39)
LDL Cholesterol: 115 mg/dL — ABNORMAL HIGH (ref 0–99)
Total CHOL/HDL Ratio: 5.4 RATIO
Triglycerides: 182 mg/dL — ABNORMAL HIGH (ref <150)
VLDL: 36 mg/dL (ref 0–40)

## 2010-08-26 LAB — POCT I-STAT 4, (NA,K, GLUC, HGB,HCT)
Glucose, Bld: 145 mg/dL — ABNORMAL HIGH (ref 70–99)
Glucose, Bld: 148 mg/dL — ABNORMAL HIGH (ref 70–99)
Glucose, Bld: 155 mg/dL — ABNORMAL HIGH (ref 70–99)
Glucose, Bld: 230 mg/dL — ABNORMAL HIGH (ref 70–99)
Glucose, Bld: 96 mg/dL (ref 70–99)
HCT: 24 % — ABNORMAL LOW (ref 36.0–46.0)
HCT: 26 % — ABNORMAL LOW (ref 36.0–46.0)
HCT: 34 % — ABNORMAL LOW (ref 36.0–46.0)
Hemoglobin: 8.8 g/dL — ABNORMAL LOW (ref 12.0–15.0)
Hemoglobin: 9.9 g/dL — ABNORMAL LOW (ref 12.0–15.0)
Potassium: 3.4 mEq/L — ABNORMAL LOW (ref 3.5–5.1)
Potassium: 3.4 mEq/L — ABNORMAL LOW (ref 3.5–5.1)
Potassium: 3.8 mEq/L (ref 3.5–5.1)
Potassium: 4.2 mEq/L (ref 3.5–5.1)
Sodium: 138 mEq/L (ref 135–145)
Sodium: 139 mEq/L (ref 135–145)
Sodium: 139 mEq/L (ref 135–145)
Sodium: 143 mEq/L (ref 135–145)

## 2010-08-26 LAB — APTT
aPTT: 30 seconds (ref 24–37)
aPTT: 71 seconds — ABNORMAL HIGH (ref 24–37)
aPTT: 23 seconds — ABNORMAL LOW (ref 24–37)

## 2010-08-26 LAB — URINALYSIS, ROUTINE W REFLEX MICROSCOPIC
Bilirubin Urine: NEGATIVE
Glucose, UA: 500 mg/dL — AB
Hgb urine dipstick: NEGATIVE
Ketones, ur: 15 mg/dL — AB
Nitrite: NEGATIVE
Protein, ur: NEGATIVE mg/dL
Specific Gravity, Urine: 1.036 — ABNORMAL HIGH (ref 1.005–1.030)
Specific Gravity, Urine: 1.011 (ref 1.005–1.030)
pH: 5 (ref 5.0–8.0)

## 2010-08-26 LAB — URINE MICROSCOPIC-ADD ON

## 2010-08-26 LAB — PROTIME-INR
INR: 0.9 (ref 0.00–1.49)
INR: 1.4 (ref 0.00–1.49)
Prothrombin Time: 12.5 seconds (ref 11.6–15.2)
Prothrombin Time: 16.7 seconds — ABNORMAL HIGH (ref 11.6–15.2)

## 2010-08-26 LAB — MRSA CULTURE

## 2010-08-26 LAB — TROPONIN I: Troponin I: 0.03 ng/mL (ref 0.00–0.06)

## 2010-08-26 LAB — POCT CARDIAC MARKERS
CKMB, poc: <1 ng/mL — ABNORMAL LOW (ref 1.0–8.0)
Troponin i, poc: <0.05 ng/mL (ref 0.00–0.09)

## 2010-08-26 LAB — HEPARIN LEVEL (UNFRACTIONATED)
Heparin Unfractionated: 0.38 IU/mL (ref 0.30–0.70)
Heparin Unfractionated: 0.47 IU/mL (ref 0.30–0.70)
Heparin Unfractionated: 0.58 IU/mL (ref 0.30–0.70)

## 2010-08-26 LAB — D-DIMER, QUANTITATIVE: D-Dimer, Quant: 3.19 ug/mL-FEU — ABNORMAL HIGH (ref 0.00–0.48)

## 2010-08-26 LAB — CARDIAC PANEL(CRET KIN+CKTOT+MB+TROPI): CK, MB: 1 ng/mL (ref 0.3–4.0)

## 2010-08-26 LAB — POCT PREGNANCY, URINE
Preg Test, Ur: NEGATIVE
Preg Test, Ur: NEGATIVE

## 2010-08-26 LAB — PLATELET COUNT: Platelets: 193 10*3/uL (ref 150–400)

## 2010-08-26 LAB — HEMOGLOBIN A1C: Hgb A1c MFr Bld: 9.3 % — ABNORMAL HIGH (ref 4.6–6.1)

## 2010-08-26 LAB — MRSA PCR SCREENING: MRSA by PCR: NEGATIVE

## 2010-09-07 ENCOUNTER — Ambulatory Visit (INDEPENDENT_AMBULATORY_CARE_PROVIDER_SITE_OTHER): Payer: Medicare Other | Admitting: Gastroenterology

## 2010-09-07 ENCOUNTER — Encounter: Payer: Self-pay | Admitting: Gastroenterology

## 2010-09-07 VITALS — BP 110/70 | HR 68 | Ht 64.0 in | Wt 189.8 lb

## 2010-09-07 DIAGNOSIS — K3184 Gastroparesis: Secondary | ICD-10-CM | POA: Insufficient documentation

## 2010-09-07 NOTE — Progress Notes (Signed)
[name] has returned for followup of her gastroparesis. On erythromycin she is symptom-free. Specifically, she is without nausea, abdominal bloating or pain.

## 2010-09-07 NOTE — Patient Instructions (Signed)
Diabetes and Gastroparesis  Gastroparesis is also called slowed stomach emptying (delayed gastric emptying). It is a condition in which the stomach takes too long to empty its contents. It often happens in people with diabetes.   CAUSES Gastroparesis happens when nerves to the stomach are damaged or stop working. When the nerves are damaged, the muscles of the stomach and intestines do not work normally. Then, the movement of food is slowed or stopped. High blood glucose (sugar) causes changes in nerves and can damage the blood vessels that carry oxygen and nutrients to the nerves. RISK FACTORS  Diabetes.   Post-viral syndromes.   Eating disorders (anorexia or bulimia).   Surgery on the stomach or vagus nerve.   Gastroesophageal reflux disease (rarely).   Smooth muscle disorders such as amyloidosis and scleroderma.   Metabolic disorders, including hypothyroidism.   Parkinson's disease.  SYMPTOMS  Heartburn.   Feeling sick to your stomach (nausea).   Vomiting of undigested food.   An early feeling of fullness when eating.   Weight loss.   Belly (abdominal) bloating.   Erratic blood glucose levels.   Lack of appetite.   Gastroesophageal reflux.   Spasms of the stomach wall.  COMPLICATIONS  Bacterial overgrowth in stomach. Food stays in the stomach and can ferment and cause germs (bacteria) to grow.   Weight loss. You have difficulty digesting and absorbing nutrients.   Vomiting.   Obstruction in the stomach. Undigested food can harden and cause nausea and vomiting.   Blood glucose fluctuations caused by inconsistent food absorption.  DIAGNOSIS The diagnosis of gastroparesis is confirmed through one or more of the following tests:  Barium X-rays and scans. These tests look at how long it takes for food to move through the stomach.   Gastric manometry. This test measures electrical and muscular activity in the stomach. A thin tube is passed down the throat into  the stomach. The tube contains a wire that takes measurements of the stomach's electrical and muscular activity as it digests liquids and solid food.   An endoscopy is a procedure done with a long, thin tube called an endoscope. It is passed through the mouth and gently guides it down the esophagus into the stomach. This tubes helps the caregiver look at the lining of the stomach to check for any abnormalities.   Ultrasound. This can rule out gallbladder disease or pancreatitis. This test will outline and define the shape of the gallbladder and pancreas.  TREATMENT  The primary treatment is to identify the problem (i.e. diabetes) and to help control blood glucose levels. Treatments include:   Exercise.   Medications to control nausea and vomiting.   Medications to stimulate stomach muscles.   Changes in what and when you eat.   Having smaller meals more often.   Eating low fiber forms of high fiber foods (i.e. cooked vegetables instead of raw).   Eating low fat foods.   Liquids are easier to digest.   In severe cases, feeding tubes and intravenous feeding may be needed.   It is important to note that in most cases, treatment does not cure gastroparesis. It is usually a lasting (chronic) condition. Treatment helps you manage the condition so that you can be as healthy and comfortable as possible.  NEW TREATMENTS  A gastric neurostimulator has been developed to assist people with gastroparesis. The battery-operated device is surgically implanted. It emits mild electrical pulses to help improve stomach emptying and to control nausea and vomiting.  The use of botulinum toxin has been shown to improve stomach emptying by decreasing the prolonged contractions of the muscle between the stomach and the small intestine (pyloric sphincter). The benefits are temporary.  SEEK MEDICAL CARE IF:  You are having problems keeping your blood glucose in goal range.   You are having nausea,  vomiting, bloating or feelings of fullness with eating.   Your symptoms do not change with a change in diet.  Document Released: 05/08/2005 Document Re-Released: 03/05/2009 [facility] Patient Information 2011 [place], [place]. Please scheduled a follow up appointment in 1 year Continue current medications

## 2010-09-07 NOTE — Assessment & Plan Note (Addendum)
Although gastric empty scan was not diagnostic she clearly has responded to erythromycin. She did not tolerate Reglan.  Recommendations #1 continuing to erythromycin indefinitely

## 2010-09-17 ENCOUNTER — Other Ambulatory Visit: Payer: Self-pay | Admitting: Gastroenterology

## 2010-10-04 ENCOUNTER — Encounter: Payer: Self-pay | Admitting: Cardiology

## 2010-10-04 NOTE — Assessment & Plan Note (Signed)
[facility]                         GASTROENTEROLOGY OFFICE NOTE   [name], [name]                     MRN:          [id number]  DATE:03/13/2007                            DOB:          [birth year]    PROBLEM:  Dysphagia and chest discomfort.   Ms. Popiel is a 45 year old white female, referred for evaluation of  above.  The last 2 weeks, she has been having immediate chest discomfort  in lower chest that may radiate to her upper abdomen upon swallowing.  This is concurrent with dysphagia.  She has the sensation of shortness  of breath along with these symptoms.  She has been taking Protonix for  years.  She was recently seen in the emergency room for these symptoms.  She is on no gastric irritants, including nonsteroidals.  She also has  not been taking any antibiotics.  She has been on a liquid diet for the  last 2 weeks.  On 1 occasion, she ate solid food and, in addition to her  pain, she developed nausea.  She has lost 14 pounds in the last 2 weeks  because she is afraid to eat.   PAST MEDICAL HISTORY:  1. Coronary artery disease.  She is status post MI.  2. She has diabetes, asthma, and anxiety.  3. She is status post angioplasty with stents.  4. She has a known 90% blockage in 1 of the small arteries.   FAMILY HISTORY:  Pertinent for diabetes in her father.   MEDICATIONS:  Simvastatin, Plavix, Glyburide/metformin, baby aspirin,  nitro, and Protonix.   SHE IS ALLERGIC TO PENICILLIN.   She neither smokes nor drinks.  She is single and unemployed.   REVIEW OF SYSTEMS:  Positive for anxiety.   EXAM:  Pulse 77, blood pressure 106/60.   PHYSICAL EXAMINATION:  HEENT: EOMI.  PERRLA.  Sclerae are anicteric.  Conjunctivae are pink.  NECK:  Supple without thyromegaly, adenopathy or carotid bruits.  CHEST:  She has few inspiratory and expiratory wheezes.  Clear to  auscultation and percussion without adventitious sounds.  CARDIAC:   Regular rhythm; normal S1 S2.  There are no murmurs, gallops  or rubs.  ABDOMEN:  Bowel sounds are normoactive.  Abdomen is soft, nontender and  nondistended.  There are no abdominal masses, tenderness, splenic  enlargement or hepatomegaly.  EXTREMITIES:  Full range of motion.  No cyanosis, clubbing or edema.  RECTAL:  Deferred.   IMPRESSION:  1. Chest pain and dysphagia.  This could be due to an esophageal      stricture.  She could have a pill esophagitis with ulceration as      well.  Candida esophagitis is also consideration.  2. Coronary artery disease.  3. Diabetes.  4. Asthma.   RECOMMENDATION:  1. Continue Protonix.  2. Upper endoscopy and dilatation as indicated.  She will remain on      her Plavix since she recently had stent placement.     Barbette Hair. Arlyce Dice, MD,FACG  Electronically Signed    RDK/MedQ  DD: 03/13/2007  DT: 03/14/2007  Job #: [id number]   cc:  Marcille Blanco, MD,FACC

## 2010-10-04 NOTE — Letter (Signed)
August 10, 2009    [facility]  [facility]  [address] [address]  [place], [place] [address]   RE:  [name], [name]  MRN:  [id number]  /  DOB:  [birth year]   Mrs. Peri is under my care for gastroparesis.  She has taken Reglan in  the past but this had to be discontinued because she did not tolerate  the drug's side effects.  Erythromycin was substituted and she has had  an excellent response.  She is on erythromycin for its promotility  effects.  I expect to keep her on this medicine for as long as she has a  response to it.  Accordingly, I request that you provide coverage for  this nonformulary medication.    Sincerely,      Barbette Hair. Arlyce Dice, MD,FACG    RDK/MedQ  DD: 08/10/2009  DT: 08/11/2009  Job #: [id number]

## 2010-10-04 NOTE — Letter (Signed)
March 13, 2007    [name]   RE:  [name], [name]  MRN:  [id number]  /  DOB:  01/12/1966   Dear Ms. Kubicki:   It is my pleasure to have treated you recently as a new patient in my  office.  I appreciate your confidence and the opportunity to participate  in your care.   Since I do have a busy inpatient endoscopy schedule and office schedule,  my office hours vary weekly.  I am, however, available for emergency  calls every day through my office.  If I cannot promptly meet an urgent  office appointment, another one of our gastroenterologists will be able  to assist you.   My well-trained staff are prepared to help you at all times.  For  emergencies after office hours, a physician from our gastroenterology  section is always available through my 24-hour answering service.   While you are under my care, I encourage discussion of your questions  and concerns, and I will be happy to return your calls as soon as I am  available.   Once again, I welcome you as a new patient and I look forward to a happy  and healthy relationship.    Sincerely,      Barbette Hair. Arlyce Dice, MD,FACG  Electronically Signed    RDK/MedQ  DD: 03/13/2007  DT: 03/14/2007  Job #: [id number]   CC:   Marcille Blanco, MD,FACC

## 2010-10-04 NOTE — Letter (Signed)
March 11, 2007     RE:  [name], [name]  MRN:  [id number]  /  DOB:  05-15-1966   Dear Milford Cage or Madame:   Mrs. [name] is a 45 year old patient of mine with a history of  coronary artery disease status post drug-eluting stent placement of the  LAD and right coronary artery in February of 2008.  She was most  recently seen by myself in the clinic on February 07, 2007.  She  remains under my care and actually has a followup appointment scheduled  in 6 months with me.  This letter is to confirm that, indeed, the  patient is a patient of mine and that she is in need of medications and  ongoing cardiology care.   If you have any further questions regarding this patient, please do not  hesitate to contact me in the [place] office.    Sincerely,      Learta Codding, MD,FACC  Electronically Signed    GED/MedQ  DD: 03/11/2007  DT: 03/12/2007  Job #: [id number]

## 2010-10-04 NOTE — Assessment & Plan Note (Signed)
[facility]                          [facility] NOTE   [name], [name]                     MRN:          [id number]  DATE:02/07/2007                            DOB:          Jun 18, 1965    HISTORY OF PRESENT ILLNESS:  The patient is a pleasant 45 year old  female with a history of coronary artery disease with drug-eluting  stents to the LAD and RCA in February 2008.  She was more recently  hospitalized with chest pain and had a stress test done which was  negative for ischemia.  This patient was recently seen in the ER a  couple of days ago due to wheezing, shortness of breath, and cough.  It  appears the patient had bronchitis but had active wheezing.  The patient  was given a Proventil inhaler and a Medrol dose pack.  Today on exam the  patient continues to wheeze and states that at times it is difficulty to  catch her breath.  She has no pain, however, that is consistent with  angina.  She has had no fever or chills.  She does report to me a  productive cough with a small amount of yellow sputum.   MEDICATIONS:  Listed in the chart.   PHYSICAL EXAMINATION:  VITAL SIGNS:  Blood pressure 130/78, heart rate  76, respirations 20, weight is 156 pounds.  NECK:  Normal carotid upstrokes, no carotid bruits.  LUNGS:  Bilateral wheezing with some crackles on the left side.  HEART:  Regular rate and rhythm.  Normal S1 S2.  ABDOMEN:  Soft, nontender, no rebound or guarding.  Good bowel sounds.  EXTREMITIES:  No cyanosis, clubbing, or edema.  NEUROLOGIC:  The patient is alert, oriented, grossly nonfocal.   PROBLEM LIST:  1. Coronary artery disease, chronic stable angina.  2. Two-vessel coronary artery disease with drug-eluting stents to the      left anterior descending artery and right coronary artery.  3. Diabetes mellitus.  4. Hyperlipidemia.  5. Acute bronchitis with a history of asthma.   PLAN:  1. The patient continues to wheeze.   She has an abnormal lung exam.      We have given her a prescription for a Z-Pak as well as an Advair      Diskus inhaler.  I told her to avoid taking the Proventil inhaler      together with her Advair Diskus as this may cause excessive      tachycardia.  2. The patient can follow up with [place] in 6 months.  3. From a cardiovascular perspective, she appears to be stable.  I      have made no adjustments      in her medications.  4. The patient will be referred also to Dr. Orson Aloe for underlying      lung disease and management of chronic pulmonary disease.     Learta Codding, MD,FACC  Electronically Signed    GED/MedQ  DD: 02/07/2007  DT: 02/07/2007  Job #: [id number]

## 2010-10-04 NOTE — Cardiovascular Report (Signed)
NAME:  [name], [name]              ACCOUNT NO.:  [id number]   MEDICAL RECORD NO.:  [id number]           PATIENT TYPE:   LOCATION:                                 FACILITY:   PHYSICIAN:  Jonelle Sidle, MD DATE OF BIRTH:  December 30, 1965   DATE OF PROCEDURE:  DATE OF DISCHARGE:                            CARDIAC CATHETERIZATION   CARDIAC CATHETERIZATION.   REQUESTING CARDIOLOGIST:  Learta Codding, M.D., [facility]   PRIMARY CARE PHYSICIAN:  Lia Hopping, M.D.   INDICATIONS:  [name] is a 45 year old woman with a history of type 2  diabetes mellitus, hyperlipidemia and hypertension, as well as  previously diagnosed esophagitis and cardiovascular disease status post  placement of previous drug-eluting stents within the left anterior  descending and right coronary artery in February 2008.  Last angiography  in April 2008 demonstrated patent stent sites with residual disease  including a 90% diagonal stenosis and moderate disease involving the  right coronary artery and posterolateral branch, both of which were  managed medically.  She is referred now for repeat cardiac  catheterization having presented for followup with Dr. Andee Lineman recently,  complaining of symptoms concerning for recent onset angina/unstable  angina.  The potential risks and benefits were explained in advance and  informed consent was obtained.   PROCEDURES PERFORMED:  1. Left heart catheterization  2. Selective coronary angiography.  3. Left ventriculography.   ACCESS AND EQUIPMENT:  The area about the right femoral artery was  anesthetized with 1% lidocaine and a 4-French sheath was placed in the  right femoral artery via the modified Seldinger technique.  Standard  preformed JL-4 and 3DRC catheters were used for selective coronary  angiography and an angled pigtail catheter was used for left heart  catheterization and left ventriculography.  A total of 90 mL Omnipaque  were used.  All exchanges were made over a  wire and the patient  tolerated the procedure well without obvious complications.   HEMODYNAMICS:  Aorta 150/90.  Left ventricle 155/30.   ANGIOGRAPHIC FINDINGS:  1. The left main coronary artery gives rise to the left anterior      descending and circumflex vessels.  There is no obvious flow-      limiting stenosis noted.  2. The left anterior descending is a medium caliber vessel.  There are      3 diagonal branches.  The first diagonal branch is medium in size      and has a focal 90% stenosis that does not appear to be      significantly different when compared to the previous films from      April 2008.  The proximal to mid left anterior descending has an      area of 40-50% stenosis and there is a stent visualized within the      midportion of the left anterior descending and has a focal 90%      stenosis which is new in comparison to the previous films.      Following this the left anterior descending has a bifurcation point      with a  diagonal branch.  This is more distally down the vessel.  3. The circumflex vessel is medium in caliber and has approximately a      30% proximal stenosis noted.  There are 4 obtuse marginal branches.      The fourth is the largest and bifurcates.  There are minor luminal      irregularities without any flow-limiting stenoses otherwise noted.  4. The right coronary artery is a medium caliber dominant vessel.  A      stent is visualized within the midportion of this vessel and there      is no obvious flow-limiting stenosis noted.  Just prior to this      stent, perhaps within the first portion of the stent, is an area of      approximately 60% stenosis.  Following this is an area of 30%      stenosis within the distal portion of the right coronary artery.      There is a right ventricular marginal branch that is overall small      and has a focal 80% proximal stenosis noted.  The posterior      descending branch has a stable 50% stenosis noted  proximally and in      the proximal portion of the posterolateral system is a focal 70-80%      stenosis which is overall stable.   Left ventriculography was performed in the RAO projection and reveals an  ejection fraction of approximately 75% with no focal wall motion  abnormality and no significant mitral regurgitation.   DIAGNOSIS:  1. Coronary artery disease as outlined.  There is a new focal 90% in-      stent stenosis within the mid left anterior descending in      comparison to the previous films from April 2008.  Remaining      disease appears to be overall stable including that within the      proximal left anterior descending, first diagonal branch,      circumflex, and overall within the right coronary artery and      posterolateral system.  There may be some progression within the      stenosis pre-stent, although the area does not appear to be      critical.  The stenoses within the posterior descending and      posterolateral branches do appear to be stable.  2. Left ventricular ejection fraction approximately 75% with no focal      wall motion abnormality.  No mitral regurgitation, and a left      ventricular end-diastolic pressure of approximately 30 mmHg.   DISCUSSION:  I reviewed the results with the patient and her fiance.  I  also conveyed this to Dr. Andee Lineman by phone.  We will plan to have Dr.  Riley Kill review the films in anticipation of a percutaneous intervention  to address the left anterior descending today.  Otherwise the right  coronary artery films can be reviewed at that time, although I suspect  medical therapy may be the next step in this regard.  Perhaps  intervention on the left anterior descending followed by medical therapy  and an outpatient Cardiolite to assess for residual ischemia might be  the best approach.  Further plans to follow in this regard following  film review by Dr. Riley Kill.      Jonelle Sidle, MD  Electronically  Signed     SGM/MEDQ  D:  08/19/2007  T:  08/19/2007  Job:  [id number]   cc:   Learta Codding, MD,FACC  Lia Hopping
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
## 2010-10-04 NOTE — Assessment & Plan Note (Signed)
[facility]                          [facility] NOTE   CONNEE, IKNER                     MRN:          [id number]  DATE:01/13/2008                            DOB:          12/01/1965    PRIMARY CARDIOLOGIST:  Learta Codding, MD, [facility]   REASON FOR VISIT:  Scheduled followup.  Please refer to my previous note  of September 13, 2007, for full details.   Ms. Metzinger continues to do well since undergoing her most recent  percutaneous intervention back in March of this year.  At that time, she  was treated with Cypher stenting of high-grade in-stent restenosis of  the LAD.  However, there was residual disease with 90% first diagonal  artery stenosis and 60% proximal RCA stenosis.  LV function was  hyperdynamic.   Ms. Weedon has not had any of the symptoms which preceded her most recent  intervention.  She does, however, continue to complain of some residual  left-sided chest burning, which I had noted at the time of her last  visit.  Although this is persistent, it is much less intense than it had  been in the past.   Ms. Bettendorf has not smoked for 10 years.  However, she is exposed to  secondhand smoke.  She also has asthma and has had COPD exacerbation.  She inquired as to whether or not she should go back on Toprol.  Apparently, she was on this medication a year ago, but it was  subsequently discontinued.  I pointed out in my note of March of this  year that, given her underlying lung disease, she is no longer a  suitable candidate for this medication.   Lipid profile from earlier today indicates stable profile, but not yet  at goal with an LDL of 84.  This was previously assessed at 83.  Total  cholesterol slightly improved, currently at 155.  HDL was 51.  Of note,  albumin is low at 3.0.  She has a history of normal renal function and  her albumin was previously normal.   Ms. Leiber has also noticed some easy bruisability.  However,  she denies  any overt evidence of bleeding.   CURRENT MEDICATIONS:  1. Plavix.  2. Full-dose aspirin.  3. Simvastatin 40 nightly.  4. Glyburide/metformin 5/500 b.i.d.  5. Isosorbide dinitrate 20 b.i.d.  6. Fish oil 1 g daily.  7. Xanax.   PHYSICAL EXAMINATION:  VITAL SIGNS:  Blood pressure 112/72, pulse 80 and  regular, weight 175.6.  GENERAL:  [name], sitting upright, in no distress.  HEENT:  Normocephalic and atraumatic.  NECK:  Palpable carotid pulse without bruits; no JVD.  LUNGS:  Clear to auscultation in all fields.  HEART:  RRR (S1S2), no significant murmurs.  Positive S4.  No rubs.  ABDOMEN:  Soft and nontender.  EXTREMITIES:  No pedal edema.  NEURO:  No focal deficit.   IMPRESSION:  1. Multivessel coronary artery disease.      a.     Status post Cypher stenting high-grade in-stent restenosis       of  LAD, March 2009.      b.     Residual 90% DX1; 60% proximal RCA stenosis.      c.     Status post drug-eluting stenting of LAD and RCA, February       2008.  2. Normal LVF.  3. Dyslipidemia.  4. Hypertension.  5. Type 2 diabetes mellitus.  6. Asthma.  7. History of esophagitis, status post stricture dilatation.  8. COPD/history of tobacco.  9. History of anxiety.  10.Easy bruisability.   PLAN:  1. Down titrate aspirin to 81 daily, given the noted easy      bruisability.  However, the patient is to remain on Plavix      indefinitely, given the severity of her underlying disease.  2. The patient is to not resume Toprol.  She has history of asthma and      chronic obstructive pulmonary disease exacerbation.  Moreover, she      has no documented history of myocardial infarction and left      ventricular function is preserved.  3. Finish simvastatin, then start Crestor at 10 mg daily for more      aggressive lipid management.  We have not been able to achieve an      LDL goal of 70 and, rather than increase to full dose simvastatin,      I have elected  to start Crestor.  We will check followup fasting      lipids/liver profile in 12 weeks.  4. The patient has requested our assistance in her applying for total      disability.  Given the      severity of her underlying coronary artery disease, we will assist      her in this manner as well.  5. Schedule return-clinic followup with Dr. Andee Lineman in 6 months.      Gene Serpe, PA-C  Electronically Signed      Learta Codding, MD,FACC  Electronically Signed   GS/MedQ  DD: 01/13/2008  DT: 01/14/2008  Job #: [id number]   cc:   Lia Hopping

## 2010-10-04 NOTE — Assessment & Plan Note (Signed)
[facility]                         GASTROENTEROLOGY OFFICE NOTE   ILLONA, [name]                     MRN:          [id number]  DATE:04/22/2007                            DOB:          Nov 15, 1965    PROBLEM:  Abdominal pain.   [name] has returned following endoscopy.  She was dilated to 17 mm.  No frank stricture was seen.  She did have a grade A esophagitis.  On  Zegerid 40 mg twice a day she reports complete resolution of her  dysphagia.  She does complain of postprandial abdominal pain, primarily  in the left upper quadrant.  She also had some subxiphoid discomfort  postprandially that occurred after she reduced her Zegerid to once a  day.  After re-increasing it to twice a day that pain subsided.  Pain is  without radiation.   EXAMINATION:  Pulse 66, blood pressure 108/74, weight 140.   IMPRESSION:  1. Dysphagia -- Resolved following dilatation therapy.  2. Postprandial abdominal pain.  This is probably due to nonulcerative      dyspepsia.   RECOMMENDATIONS:  Trial of Levbid 0.3775 mg twice a day.  Should her  pain subside then I would reduce her Zegerid to once a day.  At the same  time I carefully instructed [name] to contact me if symptoms are  persistent, at which point I would obtain an abdominal ultrasound.     Barbette Hair. Arlyce Dice, MD,FACG  Electronically Signed    RDK/MedQ  DD: 04/22/2007  DT: 04/22/2007  Job #: [id number]   cc:   Marcille Blanco, MD,FACC

## 2010-10-04 NOTE — Assessment & Plan Note (Signed)
OFFICE VISIT   ALLEXUS, OVENS  DOB:  1965-12-11                                        February 25, 2009  CHART #:  [id number]   CURRENT PROBLEMS:  1. Status post coronary bypass grafting x4 on January 26, 2009, for      unstable angina with severe three-vessel coronary disease.  2. History of PCI and stent of the LAD.  3. Diabetes mellitus.   HISTORY OF PRESENT ILLNESS:  The patient presents for her first office  visit postop.  She is a 45 year old diabetic with a prior history of  coronary artery disease and percutaneous intervention return with  unstable angina, was found to have progression of disease as well as  stenosis of her stent.  She underwent left IMA grafting with LAD and  vein grafts to the diagonal, obtuse marginal, and posterior descending.  The coronaries were found to be diffusely diseased.  She did well  following surgery, and was discharged home on metoprolol 12.5 b.i.d.,  iron, Crestor, and her preoperative medications for diabetes including  glyburide and metformin and Zegerid for her GERD.  She is also placed on  iron for postoperative anemia.  She return to the hospital with chest  wall discomfort, presyncope, and orthostasis.  Her beta-blocker was  tapered and then discontinued.  Her cardiac enzymes were negative.  Her  echo was normal LV function without pericardial effusion.  Her chest x-  ray showed no effusion or failure.  The surgical incisions were noted to  be healing well, although she did have some warmth and probable low-  grade cellulitis of her lower leg incision, and she was placed on  Avelox.  She now returns for her office visit.  Her leg is feeling much  better, and she is able to walk much easier.  She denies shortness of  breath.  She does have significant muscle tenderness and some soreness,  especially late in the day.  She has had no fever or incisional  drainage, and she denies significant weight gain or  fluid retention.  She states her blood pressure has been better, and she is less dizzy off  the Lopressor, however, she noticed her heart rate to be 100 to 120  beats per minute.   PHYSICAL EXAMINATION:  Vital Signs:  Blood pressure 117/80, pulse 97,  pulse 18, saturation 100%.  She appears pleasant and comfortable.  Breath sounds are clear and equal.  The sternum is stable and healing,  although it is tender, especially on the left side of her chest.  There  is no instability and a CT scan performed when she was hospitalized to  rule out PE also showed normal placement of the sternal wires and no  fibrous malunion of the sternal incision.  Her cardiac rhythm is regular  without rub or gallop.  Her leg incisions are healing.  There is minimal  right ankle edema.   PA and lateral chest x-ray reveals mild cardiomegaly slightly, reduced  lung volumes at the bases, but no significant pleural effusions and no  infiltrate or CHF.   PLAN:  The patient will be given a course of Flexeril for probable  muscle tightness and spasm.  She is given refill for her oxycodone.  I  will place her on low dose (12.5 mg) atenolol once daily  to help prevent  her tachycardia from progressing, and hopefully this will not reduce her  blood pressure.  She was told she could start doing some light  activities including driving, but not lift anything more than10 pounds,  and I will see her back for review in 4 weeks in the office.   Kerin Perna, M.D.  Electronically Signed   PV/MEDQ  D:  02/25/2009  T:  02/26/2009  Job:  [id number]   cc:   Arturo Morton. Riley Kill, MD, [facility]

## 2010-10-04 NOTE — Assessment & Plan Note (Signed)
[facility]                          [facility]   Kim, [name]                     MRN:          [id number]  DATE:12/24/2006                            DOB:          08/08/1965    PRIMARY CARDIOLOGIST:  Dr. Lewayne Bunting.   REASON FOR VISIT:  Post hospital followup.   [name] is a very pleasant 45 year old female, well known to our team  here in [place], who now returns to the clinic after her most recent re-  hospitalization for recurrent angina pectoris.   Patient has known coronary artery disease and had her most recent  catheterization this past April, at which time she was found to have  continued wide patency of 2 stents placed in February of this year, but  with a residual, 90% stenosis of a small first diagonal artery. Dr.  Antoine Poche felt that it was best to manage this medically although, if  patient continued to have recurrent angina pectoris, consider  percutaneous intervention and possible stenting.   When seen by myself and Dr. Andee Lineman a little over a month ago here at  [facility], patient ruled out for myocardial infarction with all  serial cardiac markers within normal limits. She was referred for a  routine treadmill [name], by Dr. Andee Lineman, during which she had some  complaint of chest pain, but had no associated significant EKG changes.  We therefore recommended continued management of her recurrent  exertional chest burning with substitution of Imdur with Nitro-Dur 0.2  mg/h, as well as up titration of beta blocker.   Since then, [name] states that she is generally feeling better;  however, she continues to have the same symptoms although perhaps of  less intensity and duration. She also took 1 nitroglycerine tablet on  just 1 occasion since her recent hospitalization.   CURRENT MEDICATIONS:  1. Plavix.  2. Full dose aspirin.  3. Zocor 40 daily.  4. Metoprolol ER 50 daily.  5. Nitro-Dur 0.2 mg/h  daily.  6. Glucovance 5/500 daily.   PHYSICAL EXAMINATION:  Blood pressure 111/72, pulse 79, regular, weight  162.  GENERAL: [name], mildly obese, sitting upright in no  distress.  HEENT: Normocephalic, atraumatic.  NECK: Palpable carotid pulses. No JVD.  LUNGS: Clear to auscultation all fields.  HEART: Regular rate and rhythm. No significant murmurs.  ABDOMEN: Benign.  EXTREMITIES: No significant edema.  NEURO: No focal deficit.   IMPRESSION:  1. Chronic stable exertional angina pectoris.      a.     Tolerating Nitro-Dur.  2. Two vessel coronary artery disease.      a.     Status post drug-eluting stenting of LAD and RCA in February       2008: widely patent stent sites by re-look cardiac       catheterization, April of 2008.      b.     Residual 90% first diagonal artery stenosis (narrow       caliber)- treated medically.      c.     Normal left ventricular function.  3. Diabetes mellitus.  4. Dyslipidemia.      a.     Recent lipid profile: Total cholesterol at 135,       triglycerides 261, HDL 36, LDL 47.  5. Hypertension.   PLAN:  1. Up titrate Nitro-Dur for better management of breakthrough      exertional angina pectoris.  2. Down titrate aspirin to 81 daily. Patient is to remain on Plavix      pending further notice.  3. Recommended supplemental fish oil 1 gram daily for treatment of      dyslipidemia/hypertriglyceridemia.  4. Schedule return clinic follow up with myself and Dr. Andee Lineman in 6      weeks for reassessment of symptoms and further recommendations.      Gene Serpe, PA-C  Electronically Signed      Learta Codding, MD,FACC  Electronically Signed   GS/MedQ  DD: 12/24/2006  DT: 12/24/2006  Job #: [id number]   cc:   Lia Hopping

## 2010-10-04 NOTE — Assessment & Plan Note (Signed)
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  The patient states that she has chronic stable angina.  She has  substernal chest pain and walking uphill fairly predictable and resolved  with nitroglycerin.  She only took 1 nitroglycerin this month, however.  She has had no resting chest pain, shortness of breath, orthopnea, or  PND.  The patient does report anxiety and depression, and has been  started on BuSpar which has helped her anxiety, but not particularly her  depression.   MEDICATIONS:  1. Plavix 75 mg p.o. daily.  2. Aspirin 81 mg p.o. b.i.d.  3. Crestor 10 mg p.o. daily.  4. Glyburide and metformin 09/1498 mg p.o. b.i.d.  5. Alprazolam 0.5 mg half a tablet daily.  6. Isosorbide 20 mg p.o. b.i.d.  7. Fish oil 1000 mg p.o. daily.  8. BuSpar per Dr. Myriam Jacobson.   PHYSICAL EXAMINATION:  VITAL SIGNS:  Blood pressure 126/78, heart rate  82, weight 187 pounds.  NECK:  Normal carotid upstroke and no carotid bruits.  LUNGS:  Clear breath sounds bilaterally.  HEART:  Regular rate and rhythm with normal S1 and S2.  No murmurs,  rubs, or gallops.  ABDOMEN:  Soft, nontender.  No rebound or guarding.  Good bowel sounds.  EXTREMITIES:  No cyanosis, clubbing, or edema.  NEURO:  The patient is alert, oriented, and grossly nonfocal.   PROBLEM LIST:  1. Multivessel coronary artery disease.      a.     Stable exertional angina.      b.     Status post Cypher stenting for high-grade in-stent       restenosis failed March of 2009.      c.     Residual 90% diagonal and 60% proximal right  coronary       artery.      d.     Status post drug-eluting stent of the left anterior       descending coronary artery and right coronary artery, February       2008.  2. Normal left ventricular ejection fraction.  3. Dyslipidemia.  4. Hypertension.  5. Type 2 diabetes mellitus.  6. Asthma.  7. History of esophagitis, status post stricture dilatation.  8. Chronic obstructive pulmonary disease.  9. History of tobacco use.  10.History of anxiety and depression.   PLAN:  1. The patient does have stable chronic angina.  However, she has      taken only 1 nitroglycerin in the last month, and I do not think      that we need to do a repeat cardiac catheterization.  I will start      her on Norvasc 5 mg a  day.  I will also see her back again in 3      months to see how she responds to therapy.  2. The patient needs to continue with ongoing risk factor      modification.  Her lipid panel is actually quite good with an LDL      that is in the 50 range.  3. I have also recommend the patient to switch her BuSpar to an SSRI      as she has not only suffers from anxiety, but also from depression.      She has multiple types of chest pains, sometimes hard to tease out      which is angina, which is anxiety related.  4. We will follow up the patient in 3 months.      Learta Codding, MD,FACC  Electronically Signed    GED/MedQ  DD: 11/02/2008  DT: 11/03/2008  Job #: [id number]

## 2010-10-04 NOTE — Cardiovascular Report (Signed)
[name], [name]              ACCOUNT NO.:  [id number]   MEDICAL RECORD NO.:  [id number]          PATIENT TYPE:  OIB   LOCATION:  6532                         FACILITY:  [facility]   PHYSICIAN:  Arturo Morton. Riley Kill, MD, FACCDATE OF BIRTH:  1965/12/14   DATE OF PROCEDURE:  08/19/2007  DATE OF DISCHARGE:                            CARDIAC CATHETERIZATION   INDICATIONS:  [name] is a 45 year old who has previously undergone  stenting of the LAD and right.  She has diabetes.  She has developed in-  stent restenosis of the LAD.  She also has scattered disease involving a  high-grade moderate-sized diagonal, a proximal LAD of about 40%, and  also mid-right proximal to the stent site of about 60%.  Dr. Diona Browner  reviewed the films, and we discussed the possibility of treatment of the  in-stent restenosis.  The stent was previously an Endeavor stent.   PROCEDURE:  Percutaneous stenting of in-stent restenosis of the left  anterior descending artery.   DESCRIPTION OF PROCEDURE:  The patient was brought to the  catheterization laboratory and prepped and draped in the usual fashion.  Using a double glove technique, we exchanged the previously indwelling  sheath for a 6-French sheath.  Bivalirudin was given according to  protocol, ACT checked and found to be appropriate for percutaneous  intervention.  The patient had chewable aspirin and also a 300 mg  booster dose of oral clopidogrel.  A guiding catheter was then utilized.  Following the appropriate views of the LAD, the lesion was crossed with  a Prowater wire.  We then placed a 12-mm x 2.5 Quantum Maverick in the  previously placed stent with in-stent restenosis.  Dilatations were  performed with marked improvement in the appearance of the artery.  Following this, a 2.5 x 18 Cypher drug-eluting platform was then placed  in the area of the in-stent restenosis.  Deployment was done between 11  and 12 atmospheres.  The balloon was then  subsequently removed without  difficulty.  This stent was then postdilated using a 2.5 Quantum  Maverick balloon, and inflations were done throughout the body of the  stent at 16 atmospheres.  There was marked improvement in the appearance  of the artery.  There was more proximal LAD disease as well as diagonal  disease, but as we had discussed previously without change, this was not  intervened upon.  All catheters were then subsequently removed and the  femoral sheath sewn into place.  We did take a picture of the right  coronary artery because of difficulty with the hospital information  system recalling [employer] images from the earlier catheterization, and  just one-view of the right was obtained.   ANGIOGRAPHIC DATA:  The left anterior descending artery demonstrates a  proximal stenosis.  This measured about 40-50%.  Distal to this is an  area of in-stent restenosis of 90%.  There is second diagonal disease as  previously noted on a bend.  The area of in-stent restenosis was a  daylight lesion, and following redilatation and stenting, was reduced to  0% residual luminal narrowing.  There was  no evidence of edge tear.   There was TIMI III runoff.   CONCLUSION:  Successful percutaneous stenting of in-stent restenosis of  the left anterior descending artery.   DISPOSITION:  The patient will remain on aspirin and Plavix.  Followup  will be with Dr. Andee Lineman.  If she continues to have symptoms,  consideration will be given to approaching the right and/or diagonal as  appropriate.      Arturo Morton. Riley Kill, MD, [facility]  Electronically Signed     TDS/MEDQ  D:  08/19/2007  T:  08/19/2007  Job:  [id number]   cc:   Learta Codding, MD,FACC  Jonelle Sidle, MD  Lia Hopping

## 2010-10-04 NOTE — Assessment & Plan Note (Signed)
[facility]                         GASTROENTEROLOGY OFFICE NOTE   [name], [name]                     MRN:          [id number]  DATE:06/21/2007                            DOB:          [birth year]    PROBLEM:  Abdominal pain.   [name] has returned for scheduled followup.  Abdominal pain is  significantly improved.  At this point, she only has mild discomfort if  she skips a meal and has a prolonged empty stomach.  She is without  dysphagia.  She has been under a great deal of strain recently,  associated with her marital separation.   EXAM:  Pulse 68, blood pressure 110/60, weight 145.   IMPRESSION:  Nonspecific dyspepsia, resolved on combination Zegerid and  hyoscyamine.   RECOMMENDATIONS:  1. Continue Zegerid for one more month and then discontinue.  2. Hyoscyamine p.r.n.  3. Xanax 0.5 mg three times a day as needed.     Barbette Hair. Arlyce Dice, MD,FACG  Electronically Signed    RDK/MedQ  DD: 06/21/2007  DT: 06/21/2007  Job #: [id number]   cc:   Lia Hopping

## 2010-10-04 NOTE — Letter (Signed)
March 13, 2007    [name]   RE:  [name], [name]  MRN:  [id number]  /  DOB:  June 11, 1965   Dear Ms. Spillers:   It is my pleasure to have treated you recently as a new patient in my  office.  I appreciate your confidence and the opportunity to participate  in your care.   Since I do have a busy inpatient endoscopy schedule and office schedule,  my office hours vary weekly.  I am, however, available for emergency  calls every day through my office.  If I cannot promptly meet an urgent  office appointment, another one of our gastroenterologists will be able  to assist you.   My well-trained staff are prepared to help you at all times.  For  emergencies after office hours, a physician from our gastroenterology  section is always available through my 24-hour answering service.   While you are under my care, I encourage discussion of your questions  and concerns, and I will be happy to return your calls as soon as I am  available.   Once again, I welcome you as a new patient and I look forward to a happy  and healthy relationship.    Sincerely,      Barbette Hair. Arlyce Dice, MD,FACG  Electronically Signed   RDK/MedQ  DD: 03/13/2007  DT: 03/14/2007  Job #: [id number]

## 2010-10-04 NOTE — Discharge Summary (Signed)
NAMEJATZIRY, WECHTER              ACCOUNT NO.:  [id number]   MEDICAL RECORD NO.:  [id number]          PATIENT TYPE:  OIB   LOCATION:  6532                         FACILITY:  [facility]   PHYSICIAN:  Arturo Morton. Riley Kill, MD, FACCDATE OF BIRTH:  [birth year]   DATE OF ADMISSION:  08/19/2007  DATE OF DISCHARGE:  08/20/2007                               DISCHARGE SUMMARY   PRIMARY CARDIOLOGIST:  Learta Codding, MD, [facility]   PRIMARY CARE [place]:  Dr. Olena Leatherwood in [place].   DISCHARGE DIAGNOSIS:  Unstable angina/coronary artery disease.   SECONDARY DIAGNOSES:  1. Hypertension.  2. Hyperlipidemia.  3. Type 2 diabetes mellitus.  4. History of asthma.  5. History of esophagitis, status post esophageal stricture      dilatation.  6. Chronic obstructive pulmonary disease with history of tobacco      abuse.  7. Anxiety.  8. Status post cesarean section.   ALLERGIES:  PENICILLIN.   PROCEDURES:  1. Left heart cardiac catheterization revealing 90% in-stent      restenosis within previously placed LAD stent.  2. Successful PCI and stenting of the LAD with placement of a 2.5 x 18-      mm Cypher drug-eluting stent.   HISTORY OF PRESENT ILLNESS:  [name] with prior history of  CAD, status post stenting of LAD and RCA with Endeavor drug-eluting  stents in February 2008.  She recently followed up with Dr. Rozell Searing,  PA, and Dr. Andee Lineman in clinic on August 16, 2007, with complaints of  recurrent exertional angina relieved with rest.  Decision was made at  that point to pursue outpatient cardiac catheterization.   HOSPITAL COURSE:  The patient presented to the outpatient  catheterization laboratory on August 19, 2007, and underwent left heart  cardiac catheterization revealing 90% in-stent restenosis within the  previously placed Endeavor drug-eluting stent in the mid LAD.  The RCA  stent was widely patent, and she otherwise had nonobstructive disease  throughout her coronary tree.  EF was  75% without any regional wall  motion abnormalities.  Films were reviewed with Dr. Shawnie Pons, and  decision was made to pursue PCI of the LAD in-stent restenosis.  This  was successfully performed with placement of 2.5 x 18-mm Cypher drug-  eluting stent.  The patient tolerated procedure well, and postprocedure,  she has been ambulating without recurrent chest discomfort or dyspnea.  She will be discharged home today in good condition.   DISCHARGE LABS:  Hemoglobin 12.5, hematocrit 36.5, WBC 9.2, platelets  252, sodium 136, potassium 3.8, chloride 104, CO2 22, BUN 9, creatinine  0.57, glucose 183, and calcium 8.9.   DISPOSITION:  The patient is being discharged home today in good  condition.   FOLLOWUP AND APPOINTMENTS:  She is to follow up with Dr. Andee Lineman on April  20, at 12:15 p.m.  She is also to follow up with Dr. Olena Leatherwood as  previously scheduled.   DISCHARGE MEDICATIONS:  1. Aspirin 325 mg daily.  2. Plavix 75 mg daily indefinitely.  3. Simvastatin 40 mg nightly.  4. Glyburide/metformin 5/500 mg b.i.d. to be  resumed August 22, 2007.  5. Nitro-Dur patch 0.2 mg daily, off nightly.  6. Xanax 0.25 mg daily.  7. Nitroglycerin 0.4 mg sublingual p.r.n. chest pain.   OUTSTANDING LABORATORY STUDIES:  None.   DURATION OF DISCHARGE ENCOUNTER:  45 minutes including physician time.      Nicolasa Ducking, ANP      Arturo Morton. Riley Kill, MD, [facility]  Electronically Signed    CB/MEDQ  D:  08/20/2007  T:  08/20/2007  Job:  [id number]   cc:   Lia Hopping

## 2010-10-04 NOTE — Assessment & Plan Note (Signed)
[facility]                          [facility]   [name], [name]                     MRN:          [id number]  DATE:08/16/2007                            DOB:          31-May-1965    PRIMARY CARDIOLOGIST:  Dr. Lewayne Bunting.   REASON FOR VISIT:  Scheduled clinic follow-up.   [name] is a pleasant 45 year old female, with known coronary artery  disease, who returns for scheduled follow-up.  She was last seen here in  the clinic in October 2008, by Dr. Andee Lineman.  Her last catheterization was  in April 2008, revealing widely patent LAD and RCA stents.  Dr. Antoine Poche  did, however, note a high-grade stenosis of a diagonal branch vessel,  which he felt could be a culprit lesion for her exertional angina.  Given that she was not experiencing any symptoms at rest, however,  recommendation was to treat medically with optimization of her  medication regimen.   The patient has not had any subsequent ischemic testing.  She presents  today complaining of two recent episodes of anterior chest burning,  reminiscent of her previous symptoms.  She had one at rest a few days  ago while watching television, and then some while walking yesterday.  This was particularly severe and lasted approximately 20 minutes,  resolving with rest.  She did not take any nitroglycerin.  She has not  had any recurrent chest discomfort.   Electrocardiogram in our office today indicates NSR at 67 BPM with  normal axis; isolated PVCs; no acute changes.   ALLERGIES:  PENICILLIN.   CURRENT MEDICATIONS:  1. Plavix 75 daily.  2. Aspirin 81 daily.  3. Simvastatin 40 every night.  4. Glyburide/metformin 5/100 b.i.d.  5. Nitro-Dur 0.2 mg daily.  6. Xanax 0.25 daily.   PAST MEDICAL HISTORY:  1. Two-vessel CAD.      a.     Status post Endeavor drug-eluting stenting of LAD and RCA in       February 2008:  widely patent stent sites by relook cardiac  catheterization, April 2008.      b.     Residual 90% first diagonal artery stenosis (narrow       caliber), treated medically.      c.     Normal left ventricular function.  2. Type 2 diabetes mellitus.  3. Dyslipidemia.  4. Hypertension.  5. Asthma.  6. History of esophagitis.  Status post esophageal stricture      dilatation.  7. Chronic obstructive pulmonary disease/history of tobacco.  8. History of anxiety.   SURGICAL HISTORY:  C-section.   SOCIAL HISTORY:  The patient had a brief history of smoking tobacco for  approximately three years, but stopped in 1995.   FAMILY HISTORY:  Brother, age 26, status post stroke at age 51.   REVIEW OF SYSTEMS:  Has occasional heartburn, but much but much improved  since being treated by Dr. Melvia Heaps of [place].  She also has  much less dysphasia, since undergoing esophageal stricture dilatation.  Otherwise, as noted per HPI, remaining systems negative.   PHYSICAL  EXAMINATION:  Blood pressure 114/75, pulse 83, regular weight  166.  GENERAL:  [name], moderately obese, sitting upright in no  distress.  HEENT:  Normocephalic, atraumatic.  NECK:  Palpable bilateral carotid pulses without bruits; no JVD at 90  degrees.  LUNGS:  Faint late expiratory wheezes, but no crackles.  HEART:  Regular rate and rhythm (S1/S2) no significant murmurs.  Positive S4.  No rubs.  ABDOMEN:  Protuberant, nontender, intact bowel sounds.  EXTREMITIES:  Palpable bilateral femoral pulses without bruits.  Palpable distal pulses without pedal edema.  NEUROLOGICAL:  No focal deficit.   IMPRESSION:  [name] is a pleasant 45 year old female, with documented  two-vessel coronary artery disease status post prior staged percutaneous  intervention with Endeavor stenting of the LAD and RCA in February 2008,  who now presents to clinic for scheduled follow-up.  She is reporting  recent development of recurrent chest burning, reminiscent of her   previous symptoms.  Her last catheterization in April 2008 showed wide  patency of both the LAD and RCA stent sites, but with some residual  disease of a narrow caliber first diagonal artery stenosis, which was  treated medically at that time.   Her symptoms are now worrisome for possible coronary artery disease  progression.  Electrocardiogram in our office today does not suggest any  acute changes.   PLAN:  Following review with Dr. Andee Lineman, recommendation is to proceed  with a repeat diagnostic coronary angiogram for definitive exclusion of  any significant CAD progression.  The patient is quite comfortable with  this and is agreeable to proceed.  We will therefore arrange for this to  be scheduled in our [facility] catheterization lab early next week.  In the  interim, however, the patient is advised to proceed directly to the  emergency room, in the event of any persistent chest pain unrelieved by  nitroglycerin.  To that end, we will renew her prescription for  nitroglycerin and also up titrate her current Nitro-Dur patch from 0.2  to 0.4 mg an hour.  We will also up titrate her aspirin to full-dose and  she is to continue taking Plavix.  With respect to the  addition of a beta blocker, the patient does have documented history of  asthma and is, therefore, not a suitable candidate for this medication.      Gene Serpe, PA-C  Electronically Signed      Learta Codding, MD,FACC  Electronically Signed   GS/MedQ  DD: 08/16/2007  DT: 08/17/2007  Job #: [id number]   cc:   Lia Hopping

## 2010-10-04 NOTE — Consult Note (Signed)
NEW PATIENT CONSULTATION   [name], [name]  DOB:  05-27-1965                                        March 26, 2009  CHART #:  [id number]   CURRENT PROBLEMS:  1. Status post coronary artery bypass graft x4 January 26, 2009 for      unstable angina with severe three-vessel disease.  2. Diabetes mellitus.  3. Atypical postoperative chest wall pain with improvement on      Flexeril.   PRESENT ILLNESS:  The patient is a very nice 45 year old female who  returns for discussion of her chest wall pain after a bypass surgery  approximately 2 months ago.  Since her last visit 4 weeks ago, she has  significantly improved.  She has started cardiac rehab and denies any  frequent significant chest pain.  She does plan seeing Dr. Andee Lineman later  this month and has continued her previous medications including aspirin,  metoprolol, Crestor, glyburide, Zegerid, Ativan, hydrocodone, and  oxycodone.   PHYSICAL EXAMINATION:  Vital Signs:  Blood pressure 130/80, pulse 100,  respirations 18, and saturation 99%.  EKG shows her to be in a sinus  rhythm.  Lungs:  Clear.  Chest:  Her sternal incision is well healed.  Cardiac:  Rhythm is regular without rub or gallop.  There is no  significant chest wall tenderness.  There is no ankle edema.   The patient complains of frequent tachycardia during her rehab sessions.  We will increase her beta-blocker to 25 mg once a day of atenolol.  She  has also requested her last prescription for Lortab Elixir (difficulty  swallowing pills), Ativan and she will return to the care of Dr. Andee Lineman.   Kerin Perna, M.D.  Electronically Signed   PV/MEDQ  D:  03/26/2009  T:  03/27/2009  Job:  [id number]

## 2010-10-05 ENCOUNTER — Encounter: Payer: Self-pay | Admitting: Cardiology

## 2010-10-05 ENCOUNTER — Ambulatory Visit (INDEPENDENT_AMBULATORY_CARE_PROVIDER_SITE_OTHER): Payer: Medicare Other | Admitting: Physician Assistant

## 2010-10-05 DIAGNOSIS — I2510 Atherosclerotic heart disease of native coronary artery without angina pectoris: Secondary | ICD-10-CM

## 2010-10-05 DIAGNOSIS — E119 Type 2 diabetes mellitus without complications: Secondary | ICD-10-CM

## 2010-10-05 DIAGNOSIS — E785 Hyperlipidemia, unspecified: Secondary | ICD-10-CM

## 2010-10-05 MED ORDER — NITROGLYCERIN 0.4 MG SL SUBL: 0.4000 mg | SUBLINGUAL_TABLET | SUBLINGUAL | Status: DC | PRN

## 2010-10-05 NOTE — Assessment & Plan Note (Signed)
She states that her Crestor dose was recently increased to full dose. Will request most recent FLP from Dr [facility] office. Recommend aggressive lipid management, with target LDL 70 or less, if possible.

## 2010-10-05 NOTE — Patient Instructions (Addendum)
   Nitroglycerin as needed for severe chest pain   Chest x-ray  If the results of your test are normal or stable, you will receive a letter.  If they are abnormal, the nurse will contact you by phone. Your physician wants you to follow up in: 6 months.  You will receive a reminder letter in the mail one-two months in advance.  If you don't receive a letter, please call our office to schedule the follow up appointment.

## 2010-10-05 NOTE — Assessment & Plan Note (Signed)
Awaiting treatment with an insulin pump.

## 2010-10-05 NOTE — Assessment & Plan Note (Addendum)
Doing well since undergoing 4v CABG, 9/10. Continue current medication regimen, and schedule return visit with Dr Andee Lineman in 6 months. Of note, will order a 2v CXR to rule out possible structural abns, as etiology for her "pulling" sensation, associated with movement. She is concerned that she may have developed a loosened, or broken, sternal wire.

## 2010-10-05 NOTE — Progress Notes (Signed)
Clinical Summary Ms. Durflinger is a 45 y.o.female, who presents for scheduled followup.  Since her last visit, she denies interim development of any exertional CP or significant DOE. She has longstanding asthma, and has had to use her nebulizer more frequently, in recent past. She also notes a "pulling" sensation near her neck, when moving in certain positions. She denies any recent trauma. She is approximately 1 & 1/2  years out from having undergone CABG, and noted this problem only in the last 2 months.   Allergies  Allergen Reactions  . Metformin   . Penicillins     Current outpatient prescriptions:acetaminophen-codeine (TYLENOL #3) 300-30 MG per tablet, Take 1-2 tablets by mouth every 6 (six) hours as needed.  , Disp: , Rfl: ;  albuterol (PROVENTIL) (2.5 MG/3ML) 0.083% nebulizer solution, Take 2.5 mg by nebulization. As needed , Disp: , Rfl:  ALPRAZolam (XANAX) 0.5 MG tablet, Take 1 tablet (0.5 mg total) by mouth at bedtime as needed for sleep or anxiety (1-2 tablets by mouth as needed )., Disp: 30 tablet, Rfl: 2;  aspirin 81 MG tablet, Take 162 mg by mouth daily.  , Disp: , Rfl: ;  atenolol (TENORMIN) 25 MG tablet, Take 25 mg by mouth daily.  , Disp: , Rfl: ;  calcium carbonate (TUMS - DOSED IN MG ELEMENTAL CALCIUM) 500 MG chewable tablet, Chew 3 tablets by mouth daily.  , Disp: , Rfl:  E.E.S. GRANULES 200 MG/5ML suspension, TAKE 6.25ML (250MG ) 4 TIMES A DAY, 30 MINUTES BEFORE MEALS AND AT BEDTIME., Disp: 2500 mL, Rfl: 3;  insulin glargine (LANTUS) 100 UNIT/ML injection, Inject 20 Units into the skin daily.  , Disp: , Rfl: ;  insulin lispro (HUMALOG) 100 UNIT/ML injection, Inject 30 Units into the skin daily.  , Disp: , Rfl: ;  rosuvastatin (CRESTOR) 40 MG tablet, Take 40 mg by mouth daily.  , Disp: , Rfl:  DISCONTD: aspirin 325 MG tablet, Take 325 mg by mouth daily.  , Disp: , Rfl: ;  DISCONTD: erythromycin ethylsuccinate (EES) 200 MG/5ML suspension, Take 200 mg by mouth 4 (four) times daily -   with meals and at bedtime.  , Disp: , Rfl: ;  DISCONTD: fluticasone (FLOVENT HFA) 44 MCG/ACT inhaler, Inhale 2 puffs into the lungs 2 (two) times daily.  , Disp: , Rfl:  DISCONTD: insulin lispro protamine-insulin lispro (HUMALOG 75/25) (75-25) 100 UNIT/ML SUSP, Inject into the skin. 10 units before meals , Disp: , Rfl: ;  DISCONTD: lansoprazole (PREVACID SOLUTAB) 30 MG disintegrating tablet, Take 30 mg by mouth daily.  , Disp: , Rfl: ;  DISCONTD: rosuvastatin (CRESTOR) 10 MG tablet, Take 10 mg by mouth daily. , Disp: , Rfl:   Past Medical History  Diagnosis Date  . Anxiety   . Asthma   . Diabetes mellitus   . History of heart attack   . GERD (gastroesophageal reflux disease)   . Esophageal stricture   . CAD (coronary artery disease)     Social History Ms. Ancrum reports that she quit smoking about 17 years ago. Her smoking use included Cigarettes. She has a 3 pack-year smoking history. She has never used smokeless tobacco. Ms. Guttman reports that she does not drink alcohol.  Review of Systems  The patient denies fatigue, malaise, fever, weight gain/loss, vision loss, decreased hearing, hoarseness, chest pain, palpitations, shortness of breath, prolonged cough, wheezing, sleep apnea, coughing up blood, abdominal pain, blood in stool, nausea, vomiting, diarrhea, heartburn, incontinence, blood in urine, muscle weakness, joint pain, leg  swelling, rash, skin lesions, headache, fainting, dizziness, depression, anxiety, enlarged lymph nodes, easy bruising or bleeding, and environmental allergies.     Physical Examination Filed Vitals:   10/05/10 1351  BP: 117/72  Pulse: 66   General: Well-developed, well-nourished in no distress head: Normocephalic and atraumatic eyes PERRLA/EOMI intact, conjunctiva and lids normal nose: No deformity or lesions mouth normal dentition, normal posterior pharynx neck: Supple, no JVD.  No masses, thyromegaly or abnormal cervical nodes Lungs: Faint expiratory  wheezing, in the bases.  heart: regular rate and rhythm with normal S1 and S2, no S3 or S4.  PMI is normal.  No pathological murmurs abdomen: Normal bowel sounds, abdomen is soft and nontender without masses, organomegaly or hernias noted.  No hepatosplenomegaly musculoskeletal: Back normal, normal gait muscle strength and tone normal pulsus: Pulse is normal in all 4 extremities Extremities: No peripheral pitting edema neurologic: Alert and oriented x 3 skin: Intact without lesions or rashes cervical nodes: No significant adenopathy psychologic: Normal affect   ECG   Studies   Problem List and Plan

## 2010-10-07 NOTE — Cardiovascular Report (Signed)
NAMEHAPPY, BEGEMAN              ACCOUNT NO.:  [id number]   MEDICAL RECORD NO.:  [id number]          PATIENT TYPE:  INP   LOCATION:  6522                         FACILITY:  [facility]   PHYSICIAN:  Everardo Beals. Juanda Chance, MD, FACCDATE OF BIRTH:  [birth year]   DATE OF PROCEDURE:  07/03/2006  DATE OF DISCHARGE:  07/05/2006                            CARDIAC CATHETERIZATION   PAST MEDICAL HISTORY:  Mrs. Greis is 45 years old and was admitted to  the hospital with chest pain and nonspecific ST-T changes on her ECG.  She was scheduled for evaluation with angiography.  She has diabetes and  tobacco use.   PROCEDURE:  The procedure was performed with a right femoral artery  arterial sheath and 6 [place] free form coronary catheters.  A front wall  arterial puncture was performed and non-opaque contrast was used.  After  completion of the diagnostic study, we made an incision with  intervention on the right coronary artery.   The patient was given Angiomax bolus, infusion was given, aspirin and  Plavix.  We used a 6 [place] Q-3.0 guiding catheter with five holes and a  Prowater wire.  We passed the Prowater wire down the LAD without  difficulty.  We predilated with a 2.5 x 50 mm  Maverick followed by one  inflation of eight atmospheres for 30 seconds.  We then deployed a 2.5 x  14 mm Endeavor stent, deploying this with one inflation of 12  atmospheres for 30 seconds.  We then post-dilated with a 2.5 x 12 mm  Quantum Maverick with only one inflation up to 15 atmospheres for 30  seconds.   FINAL DIAGNOSIS:  Insertion of guiding catheter.  The patient tolerated  the procedure well and she left the laboratory in satisfactory  condition.   RESULTS:  Left main coronary artery:  The left main coronary artery was  a short vessel that was a very short vessel that was free of significant  disease.   Left anterior descending artery:  The left anterior descending artery  gave rise to a large diagonal branch  and a second smaller diagonal  branch.  There was 80% narrowing in the ostium of the first diagonal  branch.  There was 50% stenosis in the LAD.  There was 90% stenosis in  the mid-LAD.   The circumflex:  The circumflex gave rise to two marginal branches and  three posterolateral branches.  There was 30% proximal stenosis in the  circumflex artery and 50% in the first marginal branch.   Right coronary artery:  The right coronary artery was a moderate-sized  vessel that gave rise to a right ventricular branch, posterior  descending branch and two posterolateral branches.  There was 80 to 90%  in the mid-right coronary artery.  There was 50% narrowing in the mid-  portion of the posterior descending branch and 70% narrowing in the AV  branch after the posterior descending branch.   Left ventricular artery:  The left ventricular artery showed good wall  motion with no areas of hypokinesis.  The estimated ejection fraction  was 60%.   Following  stenting of the lesion in the mid-LAD, stenosis improved from  90% to 0%.   CONCLUSION:  1. Severe coronary artery disease with 50% proximal and 90% mid-      stenosis of the left anterior descending artery and 80% narrowing      in the first diagonal branch, 30% narrowing in the proximal      circumflex artery with 50% narrowing in the first marginal branch,      80 to 90% stenosis in the mid-right coronary artery with 70%      stenosis in the distal right coronary artery posterior descending      branch and normal left ventricular function.  2. Successful percutaneous coronary intervention of the lesion of the      mid-left anterior descending artery using a drug-release stent with      improvement in narrowing from 90% to 0%.   DISPOSITION:  The patient to return for further observation.  We will  plan intervention on the right coronary artery tomorrow.      Bruce Elvera Lennox Juanda Chance, MD, [facility]  Electronically Signed     BRB/MEDQ  D:   11/09/2006  T:  11/09/2006  Job:  [id number]   cc:   Lia Hopping

## 2010-10-07 NOTE — Cardiovascular Report (Signed)
[name], [name]              ACCOUNT NO.:  [id number]   MEDICAL RECORD NO.:  [id number]          PATIENT TYPE:  INP   LOCATION:  6522                         FACILITY:  [facility]   PHYSICIAN:  Everardo Beals. Juanda Chance, MD, FACCDATE OF BIRTH:  [birth year]   DATE OF PROCEDURE:  07/04/2006  DATE OF DISCHARGE:  07/05/2006                            CARDIAC CATHETERIZATION   PERCUTANEOUS CORONARY INTERVENTION NOTE   MEDICAL DECISION MAKING:  Ms. [name] is 45 years old and had no prior  history of heart disease, although she does have diabetes, hypertension,  hyperlipidemia and smokes.  She was seen yesterday in [place] with chest  pain, by Dr. Donnella Bi and was transferred here.  She underwent  evaluation angiography yesterday and underwent stenting of a tight  lesion in the mid LAD with an Endeavor drug-eluting stent.  She was  brought back today for intervention on a tight lesion in the mid right  coronary artery.   PROCEDURE:  The procedure was performed on the left femoral artery using  an arterial sheath and a 6-French JR4 guiding catheter with side holes.  The patient was given Angiomax bolus and infusion.  She had previously  been loaded with Plavix and had been treated with aspirin.  We navigated  a Prowater wire down the right coronary artery across the lesion in the  mid vessel without difficulty.  We pre-dilated it with a 2.25 x 20 mm  Maverick, performing one inflation up to 8 oz per 30 seconds.  We then  deployed a 2.5 x 18 Endeavor stent, performing this in one inflation of  14 oz per 30 seconds.  We post dilated with a 3.0 x 15 mm Quantum  Maverick, performing two inflations with a 16 oz per 30 seconds.  We  were careful to avoid the proximal edge of the stent where we thought  the stent would be oversized with a 3.0 balloon.  The distal edge of the  stent, we tried to make certain that we expanded the distal edge since  we thought the reference vessel was larger in this area.  The  patient  tolerated the procedure well and left the lab in satisfactory condition.  The left femoral artery was closed with AngioSeal at the end of the  procedure.   RESULTS:  Initially the stenosis in the mid right coronary artery was  estimated at 80%.  Following stenting this improved to 0%.   CONCLUSION:  Successful PCI of the lesion in the mid right coronary  artery with an Endeavor drug-eluting stent with an improvement stent  area narrowing from 90% to 0%.   DISPOSITION:  We will plan discharge tomorrow if the patient remains  stable.  She should remain on Plavix for at least 1 year and possibly  longer.  We will arrange followup with Dr. Andee Lineman and Dr. Olena Leatherwood  following discharge.      Bruce Elvera Lennox Juanda Chance, MD, [facility]  Electronically Signed    BRB/MEDQ  D:  07/04/2006  T:  07/05/2006  Job:  [id number]   cc:   Learta Codding, MD,FACC  Lia Hopping

## 2010-10-07 NOTE — Letter (Signed)
January 14, 2008     RE:  EMONI, WHITWORTH  MRN:  [id number]  /  DOB:  02-Nov-1965   PRIMARY CARDIOLOGIST:  Learta Codding, MD, [facility]   To Whom It May Concern,   Ms. Evangelene Vora is a 45 year old female, well known to [place] and closely  followed here in our [facility], with history of severe coronary artery  disease.  She has undergone several percutaneous interventions, most  recently this past March, at which time she was found to have high-grade  in-stent restenosis of the left anterior descending artery.  She also  has some residual CAD, which has been treated medically.   Ms. Landry also has asthma, COPD with history of tobacco smoking,  hypertension, and type 2 diabetes mellitus.   Although Ms. Carles has been doing quite well since her most recent  percutaneous intervention earlier this year, she does continue to have  some mild residual discomfort, which has remained stable.  However,  these recurrent symptoms, as well as history of COPD exacerbation, has  made it difficult for her to function adequately on a regular basis.  It  is in this context, therefore, that she is requesting consideration for  total disability.   From a cardiac standpoint, we feel that Ms. Bosques has significant  underlying CAD and, therefore, would not be able to maintain steady  employment on a regular basis, given the frequency and severity of her  recurrent symptoms.  We, therefore, submit that she be considered for  total disability.   If there are any further questions regarding this patient, please do not  hesitate to contact our [place] office.    Sincerely,       Gene Serpe, PA-C  Electronically Signed      Learta Codding, MD,FACC  Electronically Signed   GS/MedQ  DD: 01/14/2008  DT: 01/15/2008  Job #: [id number]

## 2010-10-07 NOTE — Letter (Signed)
November 27, 2006    Social Services   RE:  [name], [name]  MRN:  [id number]  /  DOB:  1966-03-18   Dear Sir/Madam:   This letter is to confirm that Ms. [name] was hospitalized at  [facility] from November 06, 2006 to November 09, 2006 for recurrent exertional  angina.  The patient has known 2-vessel coronary artery disease, and is  status post drug-eluting stent to the LAD and right coronary artery in  February 2008.  She had a cardia catheterization April 2008, which  showed patent stents.  She also has a 90% diagonal lesion, which is the  cause for chronic chest pain syndrome, which was not favorable to  percutaneous coronary intervention.  As outlined above, the patient was  recently hospitalized for several days due to recurrent chest pain,  which required up titration of her medications.  She underwent a stress  test, which was low risk.  The patient had adjustment made her medical  regimen, and will need lifelong medical therapy for chronic angina.   This letter is to request support for this patient in assistance with  medical therapy, as well as hospitalizations.  Chronic need for  medications is crucial in this patient, and is important to improve her  longterm longevity.   If you have any questions regarding this request to provide assistance  for this patient, please call me at [facility] at [phone].    Sincerely,     Learta Codding, MD,FACC  Electronically Signed   GED/MedQ  DD: 11/27/2006  DT: 11/27/2006  Job #: [id number]

## 2010-10-07 NOTE — Letter (Signed)
July 05, 2006    [facility]  [address]  [place], [place] [address]   RE:  SHAKIERA, EDELSON  MRN:  [id number]  /  DOB:  08/04/1965   To Whom It May Concern:   I am writing to document that Mrs. Toor was hospitalized at [facility] and subsequently [facility]  from February 10 through  July 05, 2006.  During this hospitalization, she received treatment  with both narcotics and benzodiazepines.  She tells me that she has an  upcoming employment drug test.  If a hair test is used, it will likely  be positive for these substances.  However, blood and urine testing  would not be effected by the administered medications after beyond  February 21.  Before that they may be effected.    Sincerely,      Salvadore Farber, MD  Electronically Signed    WED/MedQ  DD: 07/05/2006  DT: 07/05/2006  Job #: [id number]

## 2010-10-07 NOTE — Cardiovascular Report (Signed)
NAME:  [name], [name]              ACCOUNT NO.:  [id number]   MEDICAL RECORD NO.:  [id number]          PATIENT TYPE:  OIB   LOCATION:  1963                         FACILITY:  [facility]   PHYSICIAN:  Rollene Rotunda, MD, FACCDATE OF BIRTH:  Feb 12, 1966   DATE OF PROCEDURE:  08/31/2006  DATE OF DISCHARGE:  08/31/2006                            CARDIAC CATHETERIZATION   PRIMARY:  Dr. Olena Leatherwood   CARDIOLOGIST:  Learta Codding, MD,FACC   PROCEDURE:  Left heart catheterization/coronary arteriography.   INDICATION:  Patient with exertional chest pain and previous stenting to  her RCA and LAD.   PROCEDURAL NOTE:  Left heart catheterization was performed at the right  femoral artery.  The artery was cannulated using an atrial puncture.  A  #4 French arterial sheath was inserted via the modified Seldinger  technique.  Preformed Judkins and pigtail catheter were utilized.  The  patient tolerated the procedure well and left the lab in stable  condition.   RESULTS:   HEMODYNAMICS:  LV 149/16, AO 149/111.  Coronaries of the left main were  short and normal.  The LAD had proximal 40% stenosis.  There was a mid  stent which was widely patent.  First diagonal was moderate sized but  narrow in caliber with approximately 90% stenosis (this was slightly  progressed compared to the previous).  The second and third diagonals  were tiny and normal.  The circumflex in the proximal AV groove had a  long 30% stenosis and diffuse lumen irregularities elsewhere.  First  obtuse margin was small with ostial 25% stenosis.  The second obtuse  marginal was tiny and normal.  The third obtuse marginal was large with  mid 30% stenosis.  The right coronary artery was a large dominant  vessel.  There was approximately 40% stenosis.  There was a mid stent  which is widely patent.  There was 30-40% stenosis after the stent with  slight aneurysmal dilatation.  There was 60-70% stenosis after the PDA  before a small  posterolateral.  The PDA was a moderate size and normal.   LEFT VENTRICULOGRAM:  A left ventriculogram was obtained in the RAO  projection.  The EF was at the low end of normal (50-55%), there were no  regional wall motion abnormalities.   CONCLUSION:  1. Patent stents.  2. Diffuse nonobstructive disease in the major vessels.  3. There was a branch vessel (diagonal) with high grade stenosis.      This certainly could be a culprit lesion for exertional angina.   PLAN:  The patient has angina only when she gets at a peak of exercise.  She is not having any resting symptoms.  This is a stable exertional  angina pattern.  I think at this point, it would be prudent to try to  manage her medically to see if we can overcome this.  Certainly if she  has progressive symptoms, we could consider angioplasty and potentially  stenting of this diagonal branch.      Rollene Rotunda, MD, [facility]  Electronically Signed     JH/MEDQ  D:  08/31/2006  T:  08/31/2006  Job:  [id number]   cc:   Learta Codding, MD,FACC  Lia Hopping

## 2010-10-07 NOTE — Discharge Summary (Signed)
NAMEJETTE, LEWAN              ACCOUNT NO.:  [id number]   MEDICAL RECORD NO.:  [id number]          PATIENT TYPE:  INP   LOCATION:  6522                         FACILITY:  [facility]   PHYSICIAN:  Salvadore Farber, MD  DATE OF BIRTH:  Jul 31, 1965   DATE OF ADMISSION:  07/03/2006  DATE OF DISCHARGE:  07/05/2006                               DISCHARGE SUMMARY   PRIMARY CARDIOLOGIST:  Dr. Willa Rough in [place].   PRIMARY CARE PHYSICIAN:  Dr. Olena Leatherwood also in [place].   PRINCIPAL DIAGNOSIS:  Unstable angina/coronary artery disease.   SECONDARY DIAGNOSES:  1. Hyperlipidemia.  2. Type 2 diabetes mellitus.  3. Recently diagnosed dental abscess treated with Clindamycin.  4. History of cesarean section.   ALLERGIES:  PENICILLIN.   PROCEDURES:  1. Left heart cardiac catheterization with successful PCI stenting of      the LAD with a 2.5 x 14 mm Endeavor MX2 drug-eluting stenting.  2. PCI stenting of the RCA with a 2.5 x 18 mm Endeavor MX2 drug-      eluting stent.   HISTORY OF PRESENT ILLNESS:  This is a 45 year old Caucasian female  without prior history of CAD with a negative Myoview in May 2007 who was  in her usual state of health until July 01, 2006 when she presented  to [facility] following a several week history of intermittent  chest discomfort culminating in an episode of severe substernal chest  pain and tightness on the day of admission.  Cardiology was consulted  and she was seen by Dr. Andee Lineman and it was felt that given her history of  diabetes that she had a high probably for coronary disease.  She ruled  out for MI and was transferred to [facility] for further evaluation.   HOSPITAL COURSE:  The patient underwent left heart cardiac  catheterization on July 03, 2006, revealing diffuse obstructive and  nonobstructive disease including an 80% proximal stenosis in the first  diagonal, 90% in the mid-LAD, 30% proximal stenosis in the left  circumflex, a 50%  stenosis in the proximal first obtuse marginal and an  80-90% stenosis in the mid-RCA.  EF was 60% without reasonable wall  motion abnormalities.  Attention was turned to the LAD which was  successfully stented with a 2.5 x 14 mm Endeavor MX2 drug-eluting stent.  Patient was taken back to the cath lab on February 13 for treatment of  the RCA and this was also stented with a 2.5 x 18 mm Endeavor MX2 drug-  eluting stent.  She tolerated these procedures well and post procedures  has been ambulating without any chest discomfort.  She has had  intermittent headaches especially in the setting of nitroglycerin usage.  Her postprocedure cardiac markers have remained negative and she is  being discharged home in satisfactory condition.   DISCHARGE LABS:  Hemoglobin 11.8.  Hematocrit 33.8.  WBC 8.8.  Platelets  296.  Sodium 136.  Potassium 3.5.  Chloride 108.  CO2 24.  BUN 1.  Creatinine 0.5.  Glucose 153.  Calcium 8.5.  CK-MB 2.0.  Total  cholesterol 177.  Triglycerides 166.  HDL 39.  LDL 105.   DISPOSITION:  The patient is being discharged home today in good  condition.   FOLLOWUP PLANS AND APPOINTMENTS:  She is to follow up with Dr. Olena Leatherwood,  her primary care physician, within the next 3-4 weeks.  She is to follow  up with Dr. Lewayne Bunting on July 26, 2006, at 2:30 p.m.   DISCHARGE MEDICATIONS:  1. Aspirin 325 mg daily.  2. Plavix 75 mg daily.  3. Lipitor 80 mg q.p.m.  4. Glyburide 5 mg as previously prescribed.  5. Metformin as previously prescribed to be resumed July 06, 2006.  6. Nitroglycerin 0.4 mg sublingual p.r.n. chest pain.  7. Clindamycin 150 mg q.i.d. as previously prescribed.   OUTSTANDING LAB STUDIES:  None.   DURATION DISCHARGE ENCOUNTER:  40 minutes including physician time.      Nicolasa Ducking, ANP      Salvadore Farber, MD  Electronically Signed    CB/MEDQ  D:  07/05/2006  T:  07/06/2006  Job:  [id number]   cc:   Lia Hopping

## 2010-10-07 NOTE — Assessment & Plan Note (Signed)
[facility]                          [facility]   [name], [name]                     MRN:          [id number]  DATE:09/26/2006                            DOB:          [birth year]    CARDIOLOGIST:  Learta Codding, M.D., F.A.C.C.   PRIMARY CARE PHYSICIAN:  [name]   HISTORY OF PRESENT ILLNESS:  [name] is a very pleasant 45 year old  female patient with the history of coronary disease, status post two-  vessel PCI in February of 2008.  At that time, she had an Endeavor drug  eluting stent placed to the mid-LAD and an Endeavor drug eluting stent  placed to the mid-RCA.  She had 80% proximal first diagonal stenosis  that was treated medically.  She recently presented back to the office  with complaints of chest discomfort, concerning for exertional angina  pectoris.  We placed her on metoprolol and set her up for outpatient  cardiac catheterization.  This was done April 11 by Dr. Antoine Poche.  She  had patent stents.  She now had a 90% stenosis in the first diagonal  that was slightly progressed, compared to the previous film.  This was a  moderate-sized, but narrow in caliber vessel.  Other residual disease  included a 40% proximal LAD stenosis, proximal AV groove circumflex long  30% stenosis, ostial 25% stenosis in the first obtuse marginal and mid  30% stenosis in the third obtuse marginal and an RCA with 40% stenosis  prior to and 30-40% stenosis after the stent and 60-70% stenosis after  the PDA.  Her EF was 50-55%.   It was felt that the patient's exertional angina could be treated  medically.  If she had a progression in her symptoms, then consideration  could be given towards treating her first diagonal percutaneously.  She  returns today for followup.   The patient notes that she continues to have some chest burning from  time to time, as well as chest discomfort.  This is with exertion.  Her  metoprolol was  increased to 50 mg a day and she was placed on Imdur 30  mg a day at the time of her catheterization.  She notes that, since  starting the medication, her symptoms have been better and she is able  to perform more activities without symptoms.  However, she did have some  side effects of the medications.  She noted some neck discomfort that  was quite severe when she took the metoprolol and Imdur together.  She  actually stopped the metoprolol and called the office and was told to  take half of the Imdur.  Since she has been doing that, she has had no  side effects.  Her chest pain is still much better.  She denies  significant shortness of breath, denies any syncope.  She does complain  of significant anxiety, related to her coronary disease.   CURRENT MEDICATIONS:  1. Glucovance 5/500 mg daily.  2. Plavix 75 mg daily.  3. Aspirin 325 mg daily.  4. Zocor.  5. Metoprolol ER 50 mg daily.  Checked she stopped this several this      several days ago.  6. Imdur 30 mg a half tablet daily.  7. Albuterol p.r.n.  8. Nitroglycerin p.r.n.   ALLERGIES:  PENICILLIN.   PHYSICAL EXAM:  She is a well-nourished, well-developed female, in no  distress.  Blood pressure is 129/75, pulse 81, weight 168 pounds.  HEENT:  Unremarkable.  NECK:  Without JVD.  CARDIAC:  Normal S1, S2.  Regular rate and rhythm without murmurs.  LUNGS:  Clear to auscultation bilaterally, without wheezing, rhonchi or  rales.  ABDOMEN:  Soft, nontender.  EXTREMITIES:  Without edema, calves are soft and nontender.  SKIN:  Warm and dry.  Right femoral arteriotomy site without hematoma or  bruit.   Electrocardiogram reveals sinus rhythm with a heart rate of 74, normal  axis, no acute changes.   IMPRESSION:  1. Stable exertional angina pectoris.  2. Coronary artery disease.      a.     Status post drug eluting stent placement to the LAD,       July 03, 2006.      b.     Status post drug eluting stent placement to the  RCA,       July 04, 2006.      c.     Residual 90% proximal first diagonal stenosis by recent       catheterization, as outlined above - medical therapy.      d.     Patent LAD and RCA stents by recent catheterization, as       noted above.  3. Preserved LV function.  4. Treated dyslipidemia.  5. Diabetes mellitus.  6. Anxiety.   PLAN:  Patient presents to the office today for post-catheterization  followup.  Her exertional anginal symptoms are much better on  medication, but she is still having some symptoms.  She has had some  side effects to taking these medications.  I am not exactly sure what to  make of all of her side effects.  She was tolerating the metoprolol fine  until she started taking the Imdur.  The Imdur has clearly made her  symptoms better.  I have asked that she go ahead and get back on the  metoprolol.  She should take these at separate times of the day, as  taking them together seemed to elicit some of her side effects.  She is  to start back with 25 mg of metoprolol in the morning and 15 mg of Imdur  in the evening.  After a week's time, if she is still having no side  effects, she should increase her metoprolol back to 50 mg a day.  We  will make sure she has lipids and LFTs set up in the next couple of  weeks to follow up on her cholesterol.  We will bring her back in  followup in three months' time.  If she continues to have exertional  anginal  symptoms at that time, we can certainly try Norvasc.  If her blood  pressure would not tolerate the Norvasc, we could certainly consider  Ranexa at that time.      Tereso Newcomer, PA-C       Learta Codding, MD,FACC    SW/MedQ  DD: 09/26/2006  DT: 09/26/2006  Job #: [id number]   cc:   [name]

## 2010-10-07 NOTE — Assessment & Plan Note (Signed)
[facility]                          [facility]   SHALICE, WOODRING                     MRN:          [id number]  DATE:08/29/2006                            DOB:          1965-12-01    PRIMARY CARE PHYSICIAN:  Dr. Olena Leatherwood.   PRIMARY CARDIOLOGIST:  Dr. Lewayne Bunting.   HISTORY OF PRESENT ILLNESS:  Ms. [name] is a 45 year old female patient  with a history of coronary disease status post two-vessel PCI in  February of 2008. At that time, she was noted to have an 80% proximal  first diagonal stenosis, a 90% mid LAD stenosis that was stented with an  Endeavor drug-eluting stent, 30% proximal stenosis in the left  circumflex, 50% proximal first obtuse marginal stenosis and an 80-90%  stenosis in the mid RCA that was treated with staged intervention with  an Endeavor drug-eluting stent. Her EF was 60%. She returned for  followup on July 26, 2006 with Gene Serpe, PA-C. At that time, she had  had occasional chest burning in her chest. This had been present  previously and she denied any further chest pain. She had had some  trouble affording her Plavix and she was provided some assistance. She  returns to the office today with complaints of increasing chest burning.  She notes exertional chest burning as well as left-sided chest  discomfort. She is unable to really qualify her left chest discomfort.  She said that her symptoms have definitely changed over the last week to  2 weeks. Her symptoms are more reminiscent of what she had prior to her  PCI. She says her symptoms are actually about the same. She notes  shortness of breath associated with this. Denies syncope or near  syncope. She has had some light-headedness from time to time. Denies any  nausea or diaphoresis. Denies orthopnea or paroxysmal nocturnal dyspnea  or lower extremity edema. Again she has clearly had a change in her  symptoms and is able to bring on her symptoms with  exertion and they are  relieved by rest. They will typically go away after about 20-30 minutes.  She has had occasional rest symptoms. She has noted some occasional rest  shortness of breath as well.   CURRENT MEDICATIONS:  1. Glucovance 5/500 mg a day.  2. Plavix 75 mg a day.  3. Aspirin 325 mg a day.  4. Zocor daily.  5. Albuterol p.r.n.  6. Nitroglycerin p.r.n.   ALLERGIES:  PENICILLIN.   SOCIAL HISTORY:  She denies tobacco or alcohol abuse.   FAMILY HISTORY:  Insignificant for coronary artery disease.   REVIEW OF SYSTEMS:  She denies any fever, chills, coughs. No  hematochezia, hematuria or dysuria. Denies any dysphagia or odynophagia.  The rest of the review of systems are negative.   PHYSICAL EXAMINATION:  GENERAL:  She is a well-nourished, well-developed  female in no acute distress.  VITAL SIGNS:  Blood pressure is 112/76, pulse 88, weight 175 pounds.  HEENT:  Head normocephalic, atraumatic. Eyes, PERRLA. EOMI. Sclera  clear.  NECK:  Without JVD.  LYMPH:  Without lymphadenopathy.  CAROTIDS:  Without bruits bilaterally.  CARDIAC:  Normal S1, S2. Regular rate and rhythm without murmurs.  LUNGS:  Clear to auscultation bilaterally without wheezes, rhonchi or  rales.  ABDOMEN:  Soft, nontender with normal active bowel sounds. No  organomegaly.  EXTREMITIES:  Without edema. Calves soft nontender.  SKIN:  Warm and dry.  NEUROLOGIC:  She is alert and oriented x3. Cranial nerves II-XII grossly  intact.  Dorsalis pedis and posterior tibial pulses 2+ bilaterally.   Electrocardiogram  reveals sinus rhythm with a heart rate of 80, normal  axis, no acute changes.   IMPRESSION:  1. Chest pain concerning for exertional angina pectoris.  2. Coronary artery disease.      a.     Status post drug-eluting stent placement to the left       anterior descending July 03, 2006.      b.     Status post drug-eluting stent to the right coronary artery       July 04, 2006.       c.     Residual coronary artery disease as noted above with an 80%       proximal first diagonal, 30% proximal stenosis in the left       circumflex and 50% stenosis in the proximal first obtuse marginal       branch.  3. Good left ventricular function.  4. Treated dyslipidemia.  5. Diabetes mellitus.   PLAN:  The patient presented to the office today with complaints of  exertional chest discomfort that seemed to be fairly consistent with  exertional angina. She had some of these symptoms after her cardiac  intervention. However over the last week or so, there has definitely  been a change in her symptoms and she notes that these symptoms are  quite reminiscent of what she had prior to her intervention procedure. I  discussed the case with Dr. Diona Browner. At this point in time, we think it  is important for the patient to proceed with re-look cardiac  catheterization. The risks and benefits have been explained to the  patient and she agrees to proceed. We will try to set her up as soon as  possible in the cardiac catheterization lab at [facility]. She  will be started on metoprolol ER 25 mg a day for antianginal effect. She  has nitroglycerin sublingual tablets at home. She knows how to use  these. If she has any change in her  symptoms between now and the time we see her in [place] for cardiac  catheterization, she is to go directly to the [facility]  emergency room.      Tereso Newcomer, PA-C  Electronically Signed      Jonelle Sidle, MD  Electronically Signed   SW/MedQ  DD: 08/29/2006  DT: 08/29/2006  Job #: [id number]   cc:   Lia Hopping

## 2010-10-07 NOTE — Assessment & Plan Note (Signed)
[facility]                          [facility]   [name], [name]                     MRN:          [id number]  DATE:07/26/2006                            DOB:          21-Sep-1965    PRIMARY CARDIOLOGIST:  Learta Codding, MD,FACC   REASON FOR VISIT:  Post-hospital followup.   [name] is a very pleasant 45 year old female, with no prior  documented history of coronary artery disease, but with prior negative  exercise stress Cardiolite in May 2007 for evaluation of chest pain. She  was recently referred to [place] in consultation here at [facility]  for chest pain and, despite normal enzymes and EKGs, referred for  cardiac catheterization for further evaluation.   The patient was found to have diffuse three vessel disease with normal  left ventricular function and initially underwent treatment with an  Endeavor drug-eluting stent for treatment of a 90% mid-LAD stenosis. She  then returned for a staged intervention with placement of a second  Endeavor drug-eluting stent for treatment of a high-grade mid RCA  lesion.   Residual anatomy notable for 80% proximal first diagonal, 30% proximal  circumflex, and 50% proximal first obtuse marginal stenosis.   The patient was discharged with instructions to remain on Plavix for at  least one year. However, she reports to me today that she was unable to  afford 8 tablets of this. However, she has remained on the full dose  aspirin.   Clinically, the patient reports marked improvement in her symptoms;  however, she still has occasional burning in her chest such as in  walking up a hill. She states that she had this before, but not nearly  as severe. However, she no longer has any further chest pain.   CURRENT MEDICATIONS:  1. Coated aspirin 325 daily.  2. Simvastatin 40 daily.  3. Glucovance 5/500 daily.   PHYSICAL EXAMINATION:  Blood pressure 123/77, pulse 86, regular and  weight was 178.  GENERAL: [name] sitting upright in no distress.  HEENT: Normocephalic, atraumatic.  NECK: Palpable bilateral carotid pulses without bruits.  LUNGS:  Clear to auscultation in all fields.  HEART: Regular rate and rhythm (S1, S2). No murmurs, rubs or gallops.  ABDOMEN: Soft, nontender.  EXTREMITIES: Right groin is stable with no ecchymosis, hematoma or bruit  on auscultation. Intact right femoral and posterior tibialis pulse. No  edema.  NEURO: No focal deficit.   IMPRESSION:  1. Significant two vessel coronary artery disease.      a.     Status post staged percutaneous intervention with Endeavor       drug-eluting stenting of the left anterior descending artery (LAD)       and right coronary artery (RCA).      b.     Residual non-critical diagonal and circumflex disease.      c.     Normal left ventricular function.  2. Hyperlipidemia.  3. Type 2 diabetes mellitus.  4. History of tobacco.  5. Asthma.   PLAN:  We will try to provide the patient with samples of Plavix  while  she applies for direct assistance from the manufacturer regarding this  drug. I expressed to her the importance of trying to remain on Plavix as  much as possible and to at least continue taking full dose aspirin  pending further orders. She is to otherwise continue on her remaining  medications and we will reassess her clinical status when she returns in  approximately two months.      Gene Serpe, PA-C  Electronically Signed      Learta Codding, MD,FACC  Electronically Signed   GS/MedQ  DD: 07/26/2006  DT: 07/26/2006  Job #: [id number]

## 2010-10-24 ENCOUNTER — Encounter: Payer: Self-pay | Admitting: Physician Assistant

## 2010-11-01 ENCOUNTER — Other Ambulatory Visit: Payer: Self-pay | Admitting: Gastroenterology

## 2010-11-02 ENCOUNTER — Telehealth: Payer: Self-pay | Admitting: Gastroenterology

## 2010-11-02 MED ORDER — ALPRAZOLAM 0.5 MG PO TABS: 0.5000 mg | ORAL_TABLET | Freq: Every evening | ORAL | Status: DC | PRN

## 2010-11-02 NOTE — Telephone Encounter (Signed)
Medication faxed to pharmacy today

## 2010-11-02 NOTE — Telephone Encounter (Signed)
Faxed prescription in for the 2nd time. L/M for pt

## 2010-12-27 ENCOUNTER — Other Ambulatory Visit: Payer: Self-pay | Admitting: Gastroenterology

## 2010-12-27 MED ORDER — ALPRAZOLAM 0.5 MG PO TABS: 0.5000 mg | ORAL_TABLET | Freq: Every evening | ORAL | Status: DC | PRN

## 2010-12-27 NOTE — Telephone Encounter (Signed)
Phoned in rx for pt

## 2011-01-09 ENCOUNTER — Telehealth: Payer: Self-pay | Admitting: Gastroenterology

## 2011-01-10 MED ORDER — ALPRAZOLAM 0.5 MG PO TABS: 0.5000 mg | ORAL_TABLET | Freq: Every evening | ORAL | Status: DC | PRN

## 2011-01-10 MED ORDER — ALPRAZOLAM 0.5 MG PO TABS: 0.5000 mg | ORAL_TABLET | Freq: Two times a day (BID) | ORAL | Status: DC

## 2011-01-10 NOTE — Telephone Encounter (Signed)
Contacted pt that her xanax rx was sent in on 8/7 to [facility] in [place]..... Asked pt to return my call

## 2011-01-10 NOTE — Telephone Encounter (Signed)
Resent medication to [facility] in [place]  Pts rx of 30 is only lasting 15 days.   Resent rx for 60 tablets

## 2011-02-13 LAB — BASIC METABOLIC PANEL
BUN: 9
Calcium: 8.9
Creatinine, Ser: 0.57
GFR calc non Af Amer: >60
Glucose, Bld: 183 — ABNORMAL HIGH
Sodium: 136

## 2011-02-13 LAB — URINALYSIS, DIPSTICK ONLY
Glucose, UA: >1000 — AB
Nitrite: NEGATIVE
Specific Gravity, Urine: 1.022
pH: 5.5

## 2011-02-13 LAB — CBC
Hemoglobin: 12.5
Platelets: 252
RDW: 13.6

## 2011-03-01 LAB — DIFFERENTIAL
Basophils Absolute: 0.1
Basophils Relative: 1
Eosinophils Absolute: 0
Monocytes Absolute: 0.3
Monocytes Relative: 4
Neutro Abs: 5.3
Neutrophils Relative %: 64

## 2011-03-01 LAB — COMPREHENSIVE METABOLIC PANEL
ALT: 17
Albumin: 3.6
Alkaline Phosphatase: 53
BUN: 5 — ABNORMAL LOW
Chloride: 104
Glucose, Bld: 239 — ABNORMAL HIGH
Potassium: 3.5
Sodium: 137
Total Bilirubin: 0.6

## 2011-03-01 LAB — CBC
HCT: 37.5
Hemoglobin: 12.6
RBC: 4.31
WBC: 8.3

## 2011-03-01 LAB — POCT CARDIAC MARKERS: Myoglobin, poc: 43.3

## 2011-04-05 ENCOUNTER — Other Ambulatory Visit: Payer: Self-pay | Admitting: Gastroenterology

## 2011-04-10 ENCOUNTER — Encounter: Payer: Self-pay | Admitting: Physician Assistant

## 2011-04-10 ENCOUNTER — Encounter: Payer: Self-pay | Admitting: *Deleted

## 2011-04-10 ENCOUNTER — Ambulatory Visit (INDEPENDENT_AMBULATORY_CARE_PROVIDER_SITE_OTHER): Payer: Medicare Other | Admitting: Physician Assistant

## 2011-04-10 VITALS — BP 125/75 | HR 69 | Ht 64.0 in | Wt 212.0 lb

## 2011-04-10 DIAGNOSIS — I2510 Atherosclerotic heart disease of native coronary artery without angina pectoris: Secondary | ICD-10-CM

## 2011-04-10 DIAGNOSIS — Z79899 Other long term (current) drug therapy: Secondary | ICD-10-CM

## 2011-04-10 DIAGNOSIS — E119 Type 2 diabetes mellitus without complications: Secondary | ICD-10-CM

## 2011-04-10 DIAGNOSIS — E782 Mixed hyperlipidemia: Secondary | ICD-10-CM

## 2011-04-10 DIAGNOSIS — E785 Hyperlipidemia, unspecified: Secondary | ICD-10-CM

## 2011-04-10 MED ORDER — NITROGLYCERIN 0.4 MG SL SUBL: 0.4000 mg | SUBLINGUAL_TABLET | SUBLINGUAL | Status: DC | PRN

## 2011-04-10 MED ORDER — ISOSORBIDE MONONITRATE ER 30 MG PO TB24: 30.0000 mg | ORAL_TABLET | Freq: Every day | ORAL | Status: DC

## 2011-04-10 NOTE — Assessment & Plan Note (Signed)
We'll order FLP/LFT profile for reassessment of lipid status. Aggressive management recommended, with target LDL or 70 or less, if feasible.

## 2011-04-10 NOTE — Patient Instructions (Addendum)
Follow up as scheduled. Imdur 30 mg daily. Nitroglycerin-Place one tablet under tongue every 5 minutes up to 3 doses as needed for chest pain. No more than 3 doses over a 15 minute period.  Your physician recommends that you go to the [facility] for a FASTING lipid profile, BMET, and liver function labs. Do not eat or drink after midnight.  Your physician has requested that you have en exercise stress cardiolite. For further information please visit [email or web address] Please follow instruction sheet, as given.

## 2011-04-10 NOTE — Progress Notes (Signed)
HPI: Patient presents for routine followup.  Since last OV, she reports development of exertional chest "burning", promptly relieved with rest. This is reminiscent of symptoms which preceded her prior percutaneous interventions. She has, otherwise, been essentially asymptomatic since CABG, 9/10, with no subsequent cardiac testing. She denies any such symptoms at rest, and has not had to use any NTG tablets. She also describes a left upper chest "pressure", which is unpredictable in onset, and which appears to closely eating meals. This is not exacerbated by movement or deep inspiration.  PMH: reviewed and listed in Problem List in electronic Records (and see below)  Allergies/SH/FH: available in Electronic Records for review  Current Outpatient Prescriptions  Medication Sig Dispense Refill  . acetaminophen-codeine (TYLENOL #3) 300-30 MG per tablet Take 1-2 tablets by mouth every 6 (six) hours as needed.        Marland Kitchen albuterol (PROVENTIL) (2.5 MG/3ML) 0.083% nebulizer solution Take 2.5 mg by nebulization. As needed       . ALPRAZolam (XANAX) 0.5 MG tablet Take 1 tablet (0.5 mg total) by mouth 2 (two) times daily.  60 tablet  2  . aspirin 81 MG tablet Take 162 mg by mouth daily.        Marland Kitchen atenolol (TENORMIN) 25 MG tablet Take 25 mg by mouth daily.        . calcium carbonate (TUMS - DOSED IN MG ELEMENTAL CALCIUM) 500 MG chewable tablet Chew 3 tablets by mouth daily.        . E.E.S. GRANULES 200 MG/5ML suspension TAKE 6.25ML (250MG ) 4 TIMES A DAY, 30 MINUTES BEFORE MEALS AND AT BEDTIME.  800 mL  3  . Fluticasone-Salmeterol (ADVAIR DISKUS) 100-50 MCG/DOSE AEPB Inhale 1 puff into the lungs every 12 (twelve) hours.        . insulin aspart (NOVOLOG) 100 UNIT/ML injection Inject 40 Units into the skin daily.        . insulin glargine (LANTUS) 100 UNIT/ML injection Inject 30 Units into the skin daily.       . nitroGLYCERIN (NITROSTAT) 0.4 MG SL tablet Place 1 tablet (0.4 mg total) under the tongue every 5  (five) minutes as needed for chest pain.  25 tablet  3  . rosuvastatin (CRESTOR) 40 MG tablet Take 40 mg by mouth daily.        . isosorbide mononitrate (IMDUR) 30 MG 24 hr tablet Take 1 tablet (30 mg total) by mouth daily.  30 tablet  6    ROS: no nausea, vomiting; no fever, chills; no melena, hematochezia; no claudication  PHYSICAL EXAM:  BP 125/75  Pulse 69  Ht 5\' 4"  (1.626 m)  Wt 212 lb (96.163 kg)  BMI 36.39 kg/m2 GENERAL: well-nourished, well-developed; NAD HEENT: NCAT, PERRLA, EOMI; sclera clear; no xanthelasma NECK: palpable bilateral carotid pulses, no bruits; no JVD; no TM LUNGS: CTA bilaterally CARDIAC: RRR (S1, S2); no significant murmurs; no rubs or gallops ABDOMEN: soft, non-tender; intact BS EXTREMETIES: intact distal pulses; no significant peripheral edema SKIN: warm/dry; no obvious rash/lesions MUSCULOSKELETAL: no joint deformity NEURO: no focal deficit; NL affect   EKG:    ASSESSMENT & PLAN:

## 2011-04-10 NOTE — Assessment & Plan Note (Signed)
Symptoms of "burning" worrisome for angina pectoris. Therefore, will schedule a GXT CL for risk stratification, in addition to start Imdur 30 mg daily. We'll also renew prescription for NTG. Will schedule early return visit, for review of test results. If abnormal, will strongly recommend cardiac catheterization. Patient agreeable with this plan.

## 2011-04-10 NOTE — Assessment & Plan Note (Signed)
Followed by primary M.D. 

## 2011-04-11 ENCOUNTER — Other Ambulatory Visit: Payer: Self-pay | Admitting: Gastroenterology

## 2011-04-11 NOTE — Telephone Encounter (Signed)
Called medication in for patient by phone. Called patient to inform med was called in

## 2011-04-19 DIAGNOSIS — R079 Chest pain, unspecified: Secondary | ICD-10-CM

## 2011-04-24 ENCOUNTER — Telehealth: Payer: Self-pay | Admitting: *Deleted

## 2011-04-24 MED ORDER — ROSUVASTATIN CALCIUM 20 MG PO TABS: 20.0000 mg | ORAL_TABLET | Freq: Every day | ORAL | Status: DC

## 2011-04-24 NOTE — Telephone Encounter (Signed)
Message copied by Eustace Moore on Mon Apr 24, 2011  9:20 AM ------      Message from: Eustace Moore      Created: Caleen Essex Apr 21, 2011  4:12 PM                   ----- Message -----         From: Rozell Searing, PA         Sent: 04/21/2011  11:19 AM           To: Hoover Brunette, LPN            LDL 22!. Decrease Crestor from 40 to 20 mg daily, and repeat FLP?LFT profile in 3 months.

## 2011-04-24 NOTE — Telephone Encounter (Signed)
Message copied by Eustace Moore on Mon Apr 24, 2011  9:21 AM ------      Message from: Eustace Moore      Created: Fri Apr 21, 2011  4:12 PM                   ----- Message -----         From: Rozell Searing, PA         Sent: 04/21/2011  11:21 AM           To: Hoover Brunette, LPN            NL stress test. Will review at OV.

## 2011-04-24 NOTE — Telephone Encounter (Signed)
Patient informed. Patient will break 40mg  Crestor in half until finished. New prescription sent to notify pharmacy and request to profile. Uses [facility].  Recall put in system for repeat labs in 3 months.

## 2011-04-24 NOTE — Telephone Encounter (Signed)
Left message for patient to call office.  

## 2011-04-25 ENCOUNTER — Other Ambulatory Visit: Payer: Self-pay | Admitting: *Deleted

## 2011-04-25 MED ORDER — ROSUVASTATIN CALCIUM 20 MG PO TABS: 20.0000 mg | ORAL_TABLET | Freq: Every day | ORAL | Status: DC

## 2011-04-30 IMAGING — CR DG CHEST 2V
2 series · 2 of 2 positions shown · non-contrast
Comparison: 03/11/2079

CLINICAL DATA: Chest pain and tightness.  Shortness of breath.
Diabetic.  History of heart stents.  Nonsmoker

CHEST - 2 VIEW

[w chest pa]
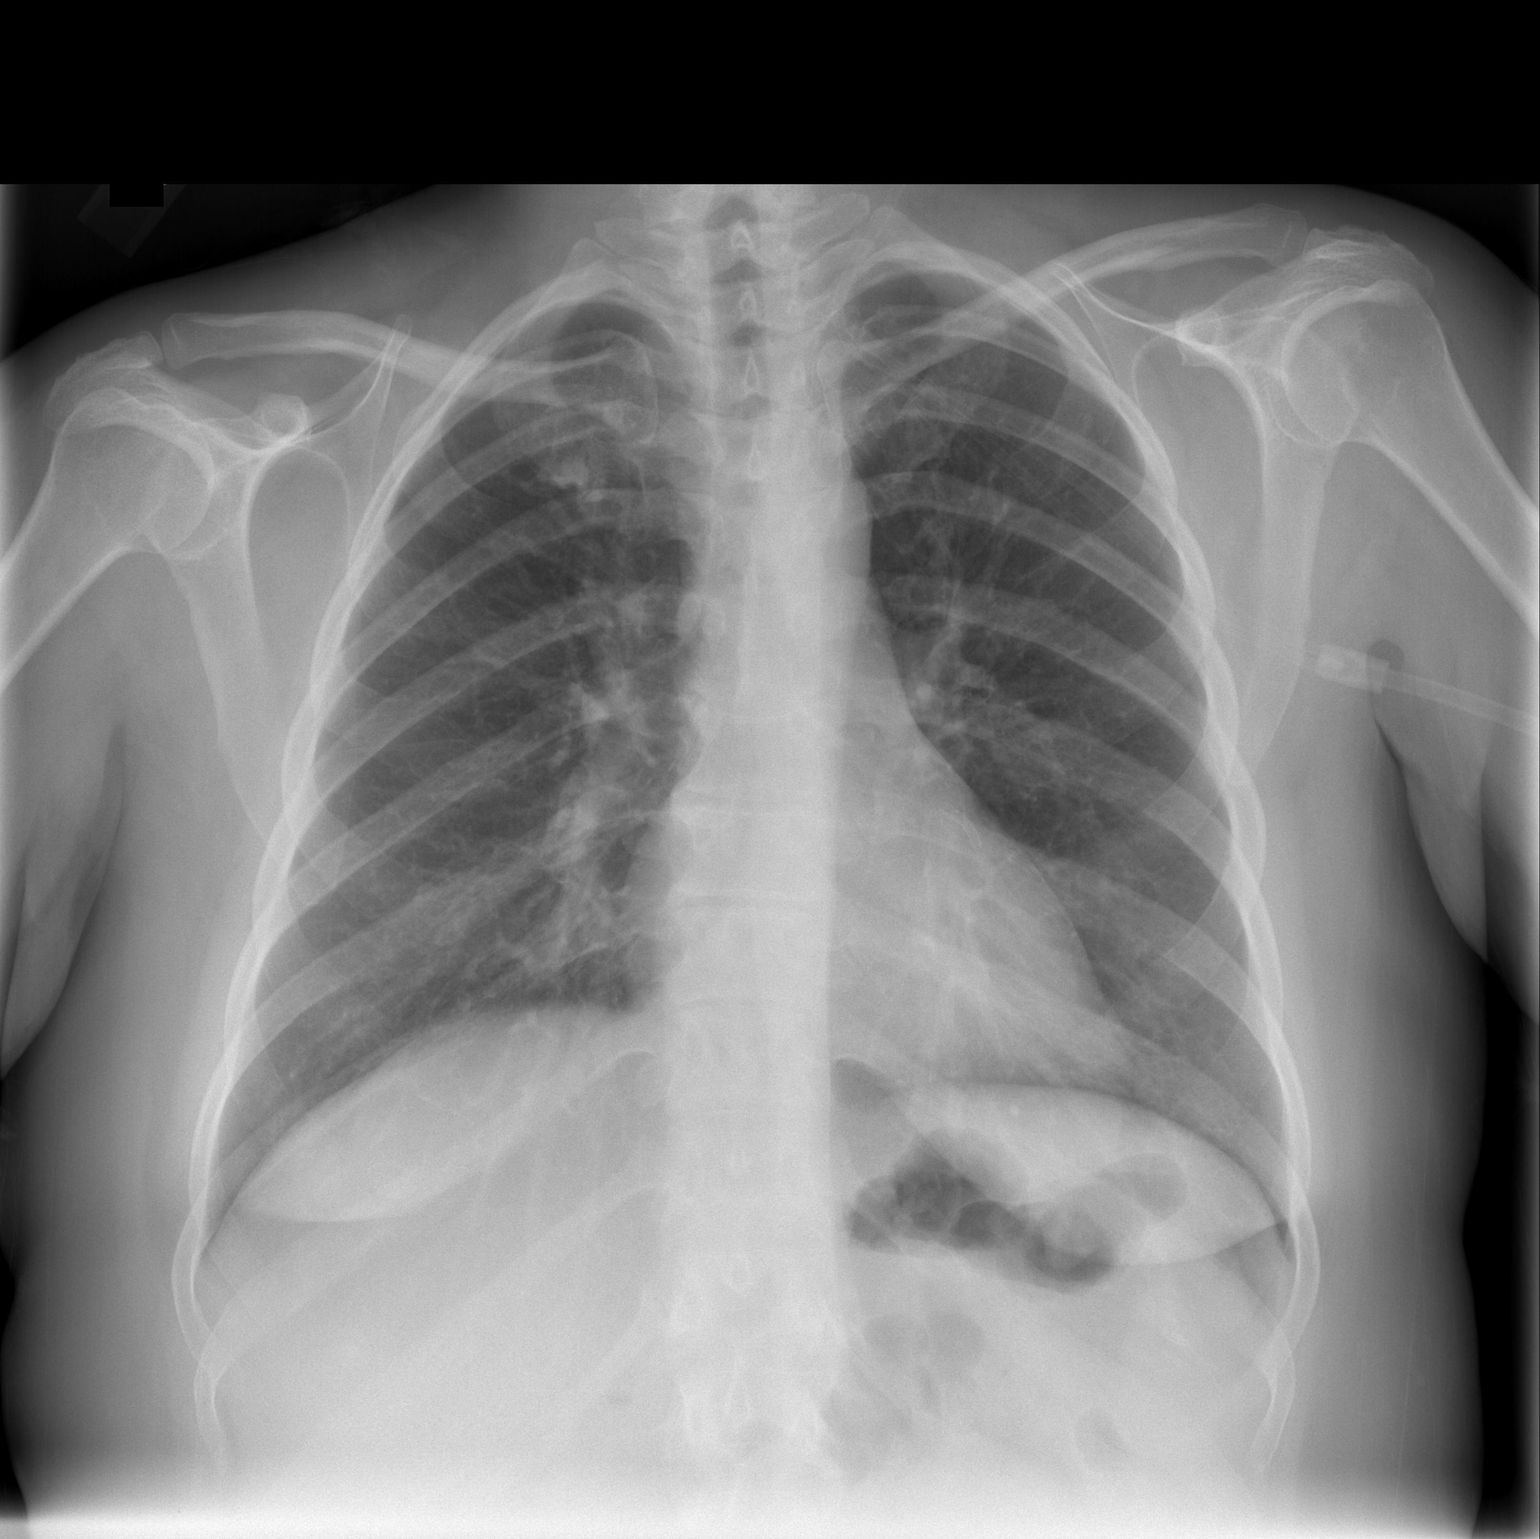

[w chest lat]
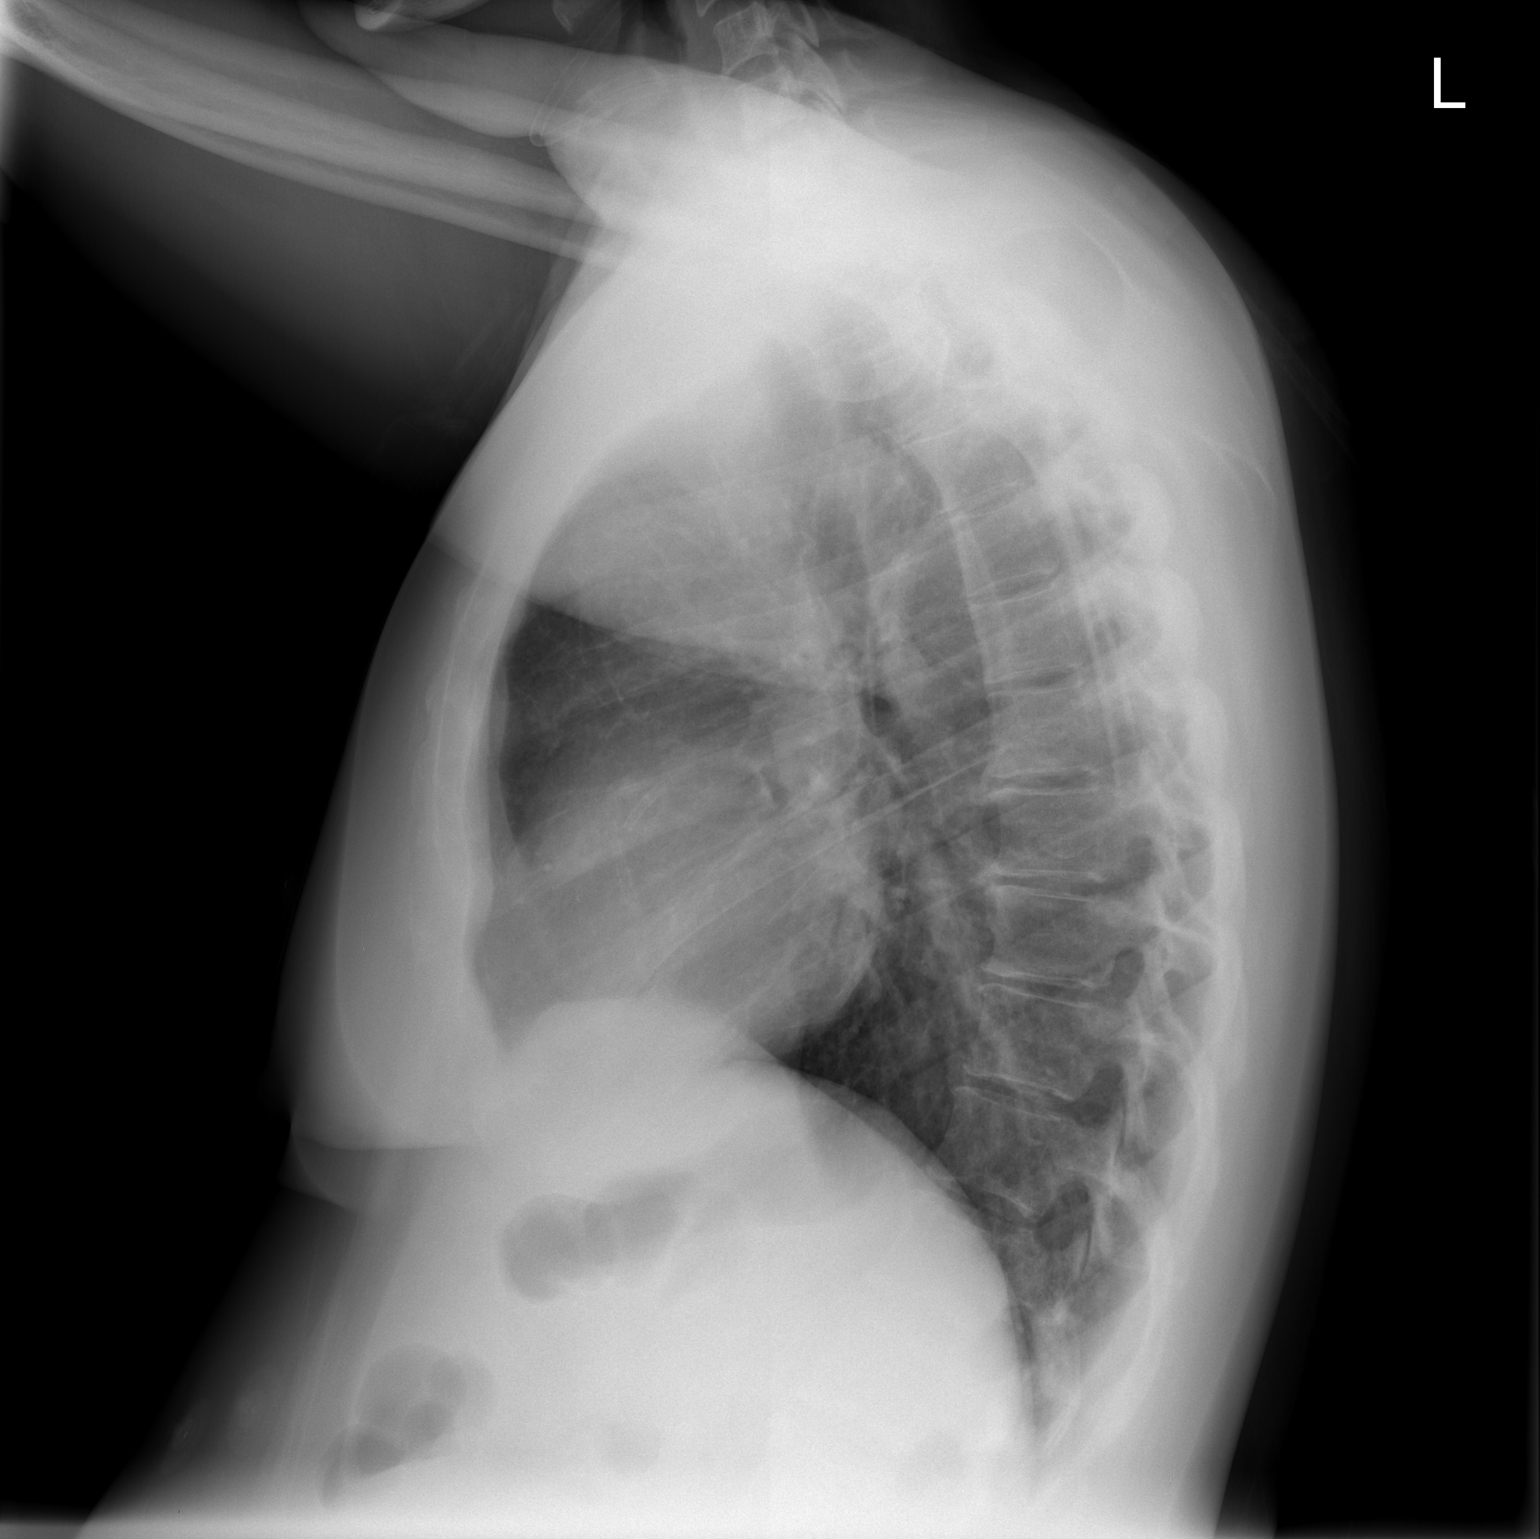

[2 of 2 positions shown; findings below may reference images not displayed]

FINDINGS: Heart and mediastinal contours are within normal limits.
The patient's coronary artery stents are visualized.  The lung
fields are clear with no evidence for focal infiltrate or
congestive failure.  No pleural effusions are seen.  Bony
structures demonstrate mild degenerative changes of the mid
thoracic spine and are otherwise intact.
IMPRESSION: Stable cardiopulmonary appearance with no acute abnormality noted.

## 2011-05-01 ENCOUNTER — Ambulatory Visit: Payer: Medicare Other | Admitting: Cardiology

## 2011-05-24 ENCOUNTER — Telehealth: Payer: Self-pay | Admitting: Gastroenterology

## 2011-05-24 MED ORDER — OMEPRAZOLE-SODIUM BICARBONATE 40-1680 MG PO PACK: 40.0000 mg | PACK | ORAL | Status: DC

## 2011-05-24 NOTE — Telephone Encounter (Signed)
Called pt to inform that medication has been sent to her pharmacy

## 2011-05-25 ENCOUNTER — Other Ambulatory Visit: Payer: Self-pay | Admitting: *Deleted

## 2011-05-25 NOTE — Telephone Encounter (Signed)
Called pt to inform that her insurance will not cover Zegerid Powder that she may can purchase that over the counter, told her to contact her pharmacy.. Explained to her to call us if she wants to try something else

## 2011-05-31 ENCOUNTER — Ambulatory Visit (INDEPENDENT_AMBULATORY_CARE_PROVIDER_SITE_OTHER): Payer: Medicare Other | Admitting: Cardiology

## 2011-05-31 ENCOUNTER — Encounter: Payer: Self-pay | Admitting: Cardiology

## 2011-05-31 VITALS — BP 121/75 | HR 57 | Ht 64.0 in | Wt 219.8 lb

## 2011-05-31 DIAGNOSIS — I2510 Atherosclerotic heart disease of native coronary artery without angina pectoris: Secondary | ICD-10-CM

## 2011-05-31 MED ORDER — NITROGLYCERIN 0.4 MG SL SUBL: 0.4000 mg | SUBLINGUAL_TABLET | SUBLINGUAL | Status: DC | PRN

## 2011-05-31 NOTE — Progress Notes (Signed)
Kim Bottoms, MD, [facility] ABIM Board Certified in Adult Cardiovascular Medicine,Internal Medicine and Critical Care Medicine    CC: Followup patient with coronary artery disease and bypass grafting  HPI:  The patient is a 46 year old female with a history of poorly controlled diabetes mellitus, likely requiring an insulin pump shortly and history of coronary artery disease status status post coronary bypass grafting. From a cardiovascular standpoint she is doing quite well. She denies any chest pain either at rest on exertion. She does have shortness of breath on moderate exertion but is able to do her house chores without any significant limitations. She reports no palpitations presyncope or syncope. She has had poor glucose control as well as weight gain and will likely require an insulin pump. She is working with her primary care physician on this. Her other risk factors appear to be well controlled including her dyslipidemia.  PMH: reviewed and listed in Problem List in Electronic Records (and see below) Past Medical History  Diagnosis Date  . Anxiety   . Asthma   . Diabetes mellitus     Scheduled to go on insulin pump  . History of heart attack   . GERD (gastroesophageal reflux disease)   . Esophageal stricture   . CAD (coronary artery disease)     4v CABG, 9/10; NL LVF, status post followup Cardiolite November 2012 no ischemia ejection fraction 65%  . Dyslipidemia      LDL 22 mg percent on Crestor   Past Surgical History  Procedure Date  . Angioplasty     with stenting   . Coronary artery bypass graft 01/26/2009    Allergies/SH/FHX : available in Electronic Records for review  Allergies  Allergen Reactions  . Metformin   . Penicillins    History   Social History  . Marital Status: Single    Spouse Name: N/A    Number of Children: 1  . Years of Education: N/A   Occupational History  . Unemployed    Social History Main Topics  . Smoking status: Former Smoker --  1.0 packs/day for 3 years    Types: Cigarettes    Quit date: 05/22/1993  . Smokeless tobacco: Never Used   Comment:  Year Quit: 1995  . Alcohol Use: No  . Drug Use: No  . Sexually Active: Not on file   Other Topics Concern  . Not on file   Social History Narrative  . No narrative on file   Family History  Problem Relation Age of Onset  . Colon cancer Neg Hx   . Heart disease Father   . Diabetes Mother     Medications: Current Outpatient Prescriptions  Medication Sig Dispense Refill  . albuterol (PROVENTIL) (2.5 MG/3ML) 0.083% nebulizer solution Take 2.5 mg by nebulization. As needed       . ALPRAZolam (XANAX) 0.5 MG tablet Take 1 tablet (0.5 mg total) by mouth 2 (two) times daily.  60 tablet  2  . aspirin 81 MG tablet Take 81 mg by mouth daily.       Marland Kitchen atenolol (TENORMIN) 25 MG tablet Take 25 mg by mouth daily.        . calcium carbonate (TUMS - DOSED IN MG ELEMENTAL CALCIUM) 500 MG chewable tablet Chew 3 tablets by mouth daily.        . E.E.S. GRANULES 200 MG/5ML suspension TAKE 6.25ML (250MG ) 4 TIMES A DAY, 30 MINUTES BEFORE MEALS AND AT BEDTIME.  800 mL  3  . Fluticasone-Salmeterol (  ADVAIR DISKUS) 100-50 MCG/DOSE AEPB Inhale 1 puff into the lungs every 12 (twelve) hours.        . insulin aspart (NOVOLOG) 100 UNIT/ML injection Inject 40 Units into the skin daily.        . insulin glargine (LANTUS) 100 UNIT/ML injection Inject 30 Units into the skin daily.       . isosorbide mononitrate (IMDUR) 30 MG 24 hr tablet Take 1 tablet (30 mg total) by mouth daily.  30 tablet  6  . rosuvastatin (CRESTOR) 20 MG tablet Take 1 tablet (20 mg total) by mouth at bedtime.  30 tablet  6  . nitroGLYCERIN (NITROSTAT) 0.4 MG SL tablet Place 1 tablet (0.4 mg total) under the tongue every 5 (five) minutes as needed for chest pain.  25 tablet  3    ROS: No nausea or vomiting. No fever or chills.No melena or hematochezia.No bleeding.No claudication  Physical Exam: BP 121/75  Pulse 57  Ht 5\' 4"   (1.626 m)  Wt 219 lb 12.8 oz (99.701 kg)  BMI 37.73 kg/m2 General: Overweight white female in no distress Neck: Normal carotid upstroke no carotid bruit. No thyromegaly nonnodular thyroid. JVP 6 cm Lungs: Clear breath sounds bilaterally without any wheezing. Cardiac: Regular rate and rhythm with normal S1-S2 and no pathological murmurs Vascular: Skin: Warm and dry Physcologic: Normal affect  12lead ECG: No EKG performed today Limited bedside ECHO:N/A   Patient Active Problem List  Diagnoses  . DIABETES MELLITUS possibly requiring insulin pump   . DYSLIPIDEMIA-well-controlled with an LDL cholesterol 22 mg percent   . ANXIETY  . MYOCARDIAL INFARCTION, HX OF  . CORONARY ARTERY DISEASE-status post coronary bypass grafting and recent Cardiolite negative for ischemia, ejection fraction 65%   . ASTHMA  . GERD  . MUSCULOSKELETAL PAIN  . PALPITATIONS  . WHEEZING  . NAUSEA WITH VOMITING  . POSTSURGICAL AORTOCORONARY BYPASS STATUS-well-healed sternotomy scar   . Gastroparesis    PLAN   The patient is doing well after bypass surgery. She has no recurrent chest pain and was evaluated 2 months ago with a myocardial perfusion study which was negative for ischemia  She has had no intercurrent admissions for chest pain or heart failure. Her ejection fraction is normal.  She does report some shortness of breath on moderate activity and I advised her to take preemptively a sublingual nitroglycerin tablet.  The patient was instructed regarding risk factor modification. LDL is well controlled and she is working with her primary care physician in improved glycemia management.  Patient will have routine followup in 6 months. We have scheduled a lipid panel LFTs in 3 months.

## 2011-05-31 NOTE — Patient Instructions (Signed)
   Nitroglycerin refill sent to pharmacy  Lipids & liver function test in 3 months - will send reminder in mail If the results of your test are normal or stable, you will receive a letter.  If they are abnormal, the nurse will contact you by phone. Your physician wants you to follow up in: 6 months.  You will receive a reminder letter in the mail one-two months in advance.  If you don't receive a letter, please call our office to schedule the follow up appointment

## 2011-06-30 ENCOUNTER — Other Ambulatory Visit: Payer: Self-pay | Admitting: *Deleted

## 2011-06-30 DIAGNOSIS — Z79899 Other long term (current) drug therapy: Secondary | ICD-10-CM

## 2011-06-30 DIAGNOSIS — E785 Hyperlipidemia, unspecified: Secondary | ICD-10-CM

## 2011-07-18 ENCOUNTER — Other Ambulatory Visit: Payer: Self-pay | Admitting: Cardiology

## 2011-07-18 DIAGNOSIS — Z7901 Long term (current) use of anticoagulants: Secondary | ICD-10-CM

## 2011-07-18 DIAGNOSIS — R0602 Shortness of breath: Secondary | ICD-10-CM

## 2011-07-18 DIAGNOSIS — I2510 Atherosclerotic heart disease of native coronary artery without angina pectoris: Secondary | ICD-10-CM

## 2011-07-19 ENCOUNTER — Ambulatory Visit (INDEPENDENT_AMBULATORY_CARE_PROVIDER_SITE_OTHER): Payer: Medicare Other | Admitting: Gastroenterology

## 2011-07-19 ENCOUNTER — Encounter: Payer: Self-pay | Admitting: Gastroenterology

## 2011-07-19 VITALS — BP 110/60 | HR 72 | Ht 64.0 in | Wt 218.0 lb

## 2011-07-19 DIAGNOSIS — R141 Gas pain: Secondary | ICD-10-CM

## 2011-07-19 DIAGNOSIS — R140 Abdominal distension (gaseous): Secondary | ICD-10-CM

## 2011-07-19 DIAGNOSIS — K3184 Gastroparesis: Secondary | ICD-10-CM

## 2011-07-19 MED ORDER — AMBULATORY NON FORMULARY MEDICATION: 10.0000 mg | Freq: Four times a day (QID) | Status: DC

## 2011-07-19 NOTE — Patient Instructions (Addendum)
Discontinue erythromycin. Gastroparesis diet Your Gastric Emptying scan is scheduled at [facility] Radiology on the 1st floor On 07/24/2011 at 1pm to arrive at 12:45pm No stomach medications 24 hours prior to your test Do not start Domeridone until after your test You will need to follow up in 1 month We sent your prescription to [facility] in [place] The telephone number is [phone]

## 2011-07-19 NOTE — Assessment & Plan Note (Signed)
She has symptoms compatible with gastroparesis, perhaps worsened because of her considerable weight gain. She no longer is responsive to erythromycin.  Recommendations #1 repeat gastric emptying scan #2 discontinue erythromycin #3 trial of domperidone 10 mg one half hour a.c. and at bedtime

## 2011-07-19 NOTE — Progress Notes (Signed)
History of Present Illness:  Mrs. Havlik has returned for evaluation of abdominal bloating and nausea. She has postprandial distention. She has a history of presumed gastroparesis although a gastric emptying scan in 2011 was normal. She's responded well to erythromycin until recently. She does not tolerate Reglan.    Review of Systems:  She has gained greater than 50 pounds since her last visit.  Pertinent positive and negative review of systems were noted in the above HPI section. All other review of systems were otherwise negative.    Current Medications, Allergies, Past Medical History, Past Surgical History, Family History and Social History were reviewed in [employer] electronic medical record  Vital signs were reviewed in today's medical record. Physical Exam: General: Well developed , well nourished, no acute distress Head: Normocephalic and atraumatic Eyes:  sclerae anicteric, EOMI Ears: Normal auditory acuity Mouth: No deformity or lesions Lungs: Clear throughout to auscultation Heart: Regular rate and rhythm; no murmurs, rubs or bruits Abdomen: Soft, non tender and non distended. No masses, hepatosplenomegaly or hernias noted. Normal Bowel sounds. There is no succussion splash Rectal:deferred Musculoskeletal: Symmetrical with no gross deformities  Pulses:  Normal pulses noted Extremities: No clubbing, cyanosis, edema or deformities noted Neurological: Alert oriented x 4, grossly nonfocal Psychological:  Alert and cooperative. Normal mood and affect

## 2011-07-24 ENCOUNTER — Telehealth: Payer: Self-pay | Admitting: Gastroenterology

## 2011-07-24 ENCOUNTER — Encounter (HOSPITAL_COMMUNITY)
Admission: RE | Admit: 2011-07-24 | Discharge: 2011-07-24 | Disposition: A | Discharge status: Home / Self Care | Payer: Medicare Other | Source: Ambulatory Visit | Attending: Gastroenterology | Admitting: Gastroenterology

## 2011-07-24 DIAGNOSIS — E119 Type 2 diabetes mellitus without complications: Secondary | ICD-10-CM | POA: Insufficient documentation

## 2011-07-24 DIAGNOSIS — R140 Abdominal distension (gaseous): Secondary | ICD-10-CM

## 2011-07-24 DIAGNOSIS — R112 Nausea with vomiting, unspecified: Secondary | ICD-10-CM | POA: Insufficient documentation

## 2011-07-24 MED ORDER — TECHNETIUM TC 99M SULFUR COLLOID
2.0000 | Freq: Once | INTRAVENOUS | Status: AC | PRN
  Administered 2011-07-24: 2 via INTRAVENOUS

## 2011-07-25 MED ORDER — AMBULATORY NON FORMULARY MEDICATION: 10.0000 mg | Freq: Four times a day (QID) | Status: DC

## 2011-07-25 NOTE — Telephone Encounter (Signed)
Refaxed Domperidone prescription to [facility] today

## 2011-07-31 ENCOUNTER — Telehealth: Payer: Self-pay | Admitting: Gastroenterology

## 2011-07-31 NOTE — Telephone Encounter (Signed)
Pt given results of normal GES.

## 2011-08-01 ENCOUNTER — Telehealth: Payer: Self-pay | Admitting: Gastroenterology

## 2011-08-01 NOTE — Telephone Encounter (Signed)
Spoke with patient and gave her the number in [place] to [employer]

## 2011-08-06 ENCOUNTER — Other Ambulatory Visit: Payer: Self-pay | Admitting: Gastroenterology

## 2011-08-07 ENCOUNTER — Telehealth: Payer: Self-pay | Admitting: Gastroenterology

## 2011-08-07 NOTE — Telephone Encounter (Signed)
L/M with husband  that medication was sent to pharmacy.Marland KitchenMarland Kitchen

## 2011-08-16 ENCOUNTER — Ambulatory Visit (INDEPENDENT_AMBULATORY_CARE_PROVIDER_SITE_OTHER): Payer: Medicare Other | Admitting: Gastroenterology

## 2011-08-16 ENCOUNTER — Encounter: Payer: Self-pay | Admitting: Gastroenterology

## 2011-08-16 VITALS — BP 120/68 | HR 72 | Ht 64.0 in | Wt 213.8 lb

## 2011-08-16 DIAGNOSIS — K3184 Gastroparesis: Secondary | ICD-10-CM

## 2011-08-16 MED ORDER — HYOSCYAMINE SULFATE 0.125 MG SL SUBL: 0.2500 mg | SUBLINGUAL_TABLET | SUBLINGUAL | Status: DC | PRN

## 2011-08-16 NOTE — Assessment & Plan Note (Signed)
Although a gastric empty scan was negative she clearly has responded to domperidone.  Recommendations #1 continue domperidone #2 gastroparesis diet #3 hyomax when necessary abdominal pain

## 2011-08-16 NOTE — Progress Notes (Signed)
History of Present Illness:  [name] has returned for followup of nausea and bloating. Gastric imaging scan was normal. She was started on domperidone. Since starting domperidone her symptoms have significantly improved. She normal there has nausea or bloating.  She has developed some mild postprandial upper abdominal discomfort when she eats very products.    Review of Systems: Pertinent positive and negative review of systems were noted in the above HPI section. All other review of systems were otherwise negative.    Current Medications, Allergies, Past Medical History, Past Surgical History, Family History and Social History were reviewed in [employer] electronic medical record  Vital signs were reviewed in today's medical record. Physical Exam: General: Well developed , well nourished, no acute distress

## 2011-08-16 NOTE — Patient Instructions (Signed)
You will need to make a follow up appointment in 4 months

## 2011-08-21 ENCOUNTER — Emergency Department (HOSPITAL_COMMUNITY): Payer: Medicare Other

## 2011-08-21 ENCOUNTER — Encounter (HOSPITAL_COMMUNITY): Payer: Self-pay

## 2011-08-21 ENCOUNTER — Other Ambulatory Visit: Payer: Self-pay

## 2011-08-21 ENCOUNTER — Emergency Department (HOSPITAL_COMMUNITY)
Admission: EM | Admit: 2011-08-21 | Discharge: 2011-08-21 | Disposition: A | Discharge status: Home / Self Care | Payer: Medicare Other | Attending: Emergency Medicine | Admitting: Emergency Medicine

## 2011-08-21 DIAGNOSIS — Z79899 Other long term (current) drug therapy: Secondary | ICD-10-CM | POA: Insufficient documentation

## 2011-08-21 DIAGNOSIS — M7989 Other specified soft tissue disorders: Secondary | ICD-10-CM | POA: Insufficient documentation

## 2011-08-21 DIAGNOSIS — I252 Old myocardial infarction: Secondary | ICD-10-CM | POA: Insufficient documentation

## 2011-08-21 DIAGNOSIS — K219 Gastro-esophageal reflux disease without esophagitis: Secondary | ICD-10-CM | POA: Insufficient documentation

## 2011-08-21 DIAGNOSIS — Z951 Presence of aortocoronary bypass graft: Secondary | ICD-10-CM | POA: Insufficient documentation

## 2011-08-21 DIAGNOSIS — E119 Type 2 diabetes mellitus without complications: Secondary | ICD-10-CM | POA: Insufficient documentation

## 2011-08-21 DIAGNOSIS — E785 Hyperlipidemia, unspecified: Secondary | ICD-10-CM | POA: Insufficient documentation

## 2011-08-21 DIAGNOSIS — E039 Hypothyroidism, unspecified: Secondary | ICD-10-CM | POA: Insufficient documentation

## 2011-08-21 DIAGNOSIS — R600 Localized edema: Secondary | ICD-10-CM

## 2011-08-21 DIAGNOSIS — F411 Generalized anxiety disorder: Secondary | ICD-10-CM | POA: Insufficient documentation

## 2011-08-21 DIAGNOSIS — I10 Essential (primary) hypertension: Secondary | ICD-10-CM | POA: Insufficient documentation

## 2011-08-21 DIAGNOSIS — Z794 Long term (current) use of insulin: Secondary | ICD-10-CM | POA: Insufficient documentation

## 2011-08-21 DIAGNOSIS — Z7982 Long term (current) use of aspirin: Secondary | ICD-10-CM | POA: Insufficient documentation

## 2011-08-21 DIAGNOSIS — I2510 Atherosclerotic heart disease of native coronary artery without angina pectoris: Secondary | ICD-10-CM | POA: Insufficient documentation

## 2011-08-21 DIAGNOSIS — R609 Edema, unspecified: Secondary | ICD-10-CM | POA: Insufficient documentation

## 2011-08-21 DIAGNOSIS — J45909 Unspecified asthma, uncomplicated: Secondary | ICD-10-CM | POA: Insufficient documentation

## 2011-08-21 HISTORY — DX: Essential (primary) hypertension: I10

## 2011-08-21 LAB — CBC
HCT: 37.2 % (ref 36.0–46.0)
Hemoglobin: 12.4 g/dL (ref 12.0–15.0)
MCH: 27.3 pg (ref 26.0–34.0)
MCHC: 33.3 g/dL (ref 30.0–36.0)
MCV: 81.9 fL (ref 78.0–100.0)
Platelets: 236 10*3/uL (ref 150–400)
RBC: 4.54 MIL/uL (ref 3.87–5.11)
RDW: 15.2 % (ref 11.5–15.5)
WBC: 8.1 10*3/uL (ref 4.0–10.5)

## 2011-08-21 LAB — URINALYSIS, ROUTINE W REFLEX MICROSCOPIC
Bilirubin Urine: NEGATIVE
Glucose, UA: NEGATIVE mg/dL
Hgb urine dipstick: NEGATIVE
Ketones, ur: NEGATIVE mg/dL
Leukocytes, UA: NEGATIVE
Nitrite: NEGATIVE
Protein, ur: NEGATIVE mg/dL
Specific Gravity, Urine: 1.004 — ABNORMAL LOW (ref 1.005–1.030)
Urobilinogen, UA: 0.2 mg/dL (ref 0.0–1.0)
pH: 7 (ref 5.0–8.0)

## 2011-08-21 LAB — DIFFERENTIAL
Basophils Absolute: 0.1 10*3/uL (ref 0.0–0.1)
Basophils Relative: 1 % (ref 0–1)
Eosinophils Absolute: 0.2 10*3/uL (ref 0.0–0.7)
Eosinophils Relative: 2 % (ref 0–5)
Lymphocytes Relative: 32 % (ref 12–46)
Lymphs Abs: 2.6 10*3/uL (ref 0.7–4.0)
Monocytes Absolute: 0.6 10*3/uL (ref 0.1–1.0)
Monocytes Relative: 8 % (ref 3–12)
Neutro Abs: 4.7 10*3/uL (ref 1.7–7.7)
Neutrophils Relative %: 57 % (ref 43–77)

## 2011-08-21 LAB — BASIC METABOLIC PANEL
BUN: 3 mg/dL — ABNORMAL LOW (ref 6–23)
CO2: 25 mEq/L (ref 19–32)
Calcium: 9.3 mg/dL (ref 8.4–10.5)
Chloride: 104 mEq/L (ref 96–112)
Creatinine, Ser: 0.48 mg/dL — ABNORMAL LOW (ref 0.50–1.10)
GFR calc Af Amer: >90 mL/min (ref >90)
GFR calc non Af Amer: >90 mL/min (ref >90)
Glucose, Bld: 152 mg/dL — ABNORMAL HIGH (ref 70–99)
Potassium: 3.8 mEq/L (ref 3.5–5.1)
Sodium: 139 mEq/L (ref 135–145)

## 2011-08-21 MED ORDER — FUROSEMIDE 20 MG PO TABS: 20.0000 mg | ORAL_TABLET | Freq: Two times a day (BID) | ORAL | Status: DC

## 2011-08-21 NOTE — ED Notes (Signed)
Pt denies any pain or questions upon discharge. 

## 2011-08-21 NOTE — ED Notes (Signed)
Pt complains of swelling in legs and ankles since saturday

## 2011-08-21 NOTE — Discharge Instructions (Signed)
You will need to follow up with your cardiologist and primary doctor. Return here as needed.

## 2011-08-21 NOTE — ED Notes (Signed)
Pt resting quietly with visitor at bedside, pt denies any pain or complaints at this time. Plan of care is updated with verbal understanding, Will continue to monitor pt. Pt is awaiting test results and MD re-evaluation for disposition.

## 2011-08-21 NOTE — ED Notes (Signed)
Reports BLE edema from calf area down to feet x 1 week. States edema improves in evening when able to elevate feet but returns next day when on feet. C/o worsening edema not relieved with elevation & rest & pain when ambulating.  Denies SOB, Respirations even & unlabored, no distress.  Pt ambulatory from triage into room

## 2011-08-21 NOTE — ED Provider Notes (Signed)
History     CSN: [id number]  Arrival date & time 08/21/11  1416   First MD Initiated Contact with Patient 08/21/11 1705      Chief Complaint  Patient presents with  . Leg Swelling    (Consider location/radiation/quality/duration/timing/severity/associated sxs/prior treatment) HPI [name] is a 46 yo female with a hx of CABG in 03/2011, HTN, and DM2, who presents to the ED today with complaints of bilateral lower extremity swelling.  Pt states the started on Saturday, but denies any worsening.  She states the swelling is worse with walking around, but then resolves with elevation of her legs at night.  Denies any chest pain, SOB, orthopnea, fever, chill, coughing or wheezing.  She has never had anything like this before.    Past Medical History  Diagnosis Date  . Anxiety   . Asthma   . Diabetes mellitus     Scheduled to go on insulin pump  . History of heart attack   . GERD (gastroesophageal reflux disease)   . Esophageal stricture   . CAD (coronary artery disease)     4v CABG, 9/10; NL LVF, status post followup Cardiolite November 2012 no ischemia ejection fraction 65%  . Dyslipidemia      LDL 22 mg percent on Crestor  . Hypothyroidism   . Hypertension     Past Surgical History  Procedure Date  . Angioplasty     with stenting   . Coronary artery bypass graft 01/26/2009    Family History  Problem Relation Age of Onset  . Colon cancer Neg Hx   . Heart disease Father   . Diabetes Mother     History  Substance Use Topics  . Smoking status: Former Smoker -- 1.0 packs/day for 3 years    Types: Cigarettes    Quit date: 05/22/1993  . Smokeless tobacco: Never Used   Comment:  Year Quit: 1995  . Alcohol Use: No    OB History    Grav Para Term Preterm Abortions TAB SAB Ect Mult Living                  Review of Systems All pertinent positives and negatives reviewed in the history of present illness  Allergies  Metformin and Penicillins  Home Medications    Current Outpatient Rx  Name Route Sig Dispense Refill  . ALBUTEROL SULFATE (2.5 MG/3ML) 0.083% IN NEBU Nebulization Take 2.5 mg by nebulization every 4 (four) hours as needed. For wheezing.    Marland Kitchen ALPRAZOLAM 0.5 MG PO TABS Oral Take 0.5 mg by mouth 2 (two) times daily.    . AMBULATORY NON FORMULARY MEDICATION Oral Take 10 mg by mouth 4 (four) times daily. Medication Name: DOMPERIDONE 10MG  1 BY MOUTH 3 TIMES A DAY 30 MINUTES BEFORE MEALS AND AT BEDTIME 120 tablet 3  . ASPIRIN 81 MG PO CHEW Oral Chew 81 mg by mouth daily.    . ATENOLOL 25 MG PO TABS Oral Take 25 mg by mouth daily.      Marland Kitchen CALCIUM CARBONATE ANTACID 500 MG PO CHEW Oral Chew 3 tablets by mouth daily.      Marland Kitchen FLUTICASONE-SALMETEROL 100-50 MCG/DOSE IN AEPB Inhalation Inhale 1 puff into the lungs every 12 (twelve) hours.      Marland Kitchen HYOSCYAMINE SULFATE 0.125 MG SL SUBL Sublingual Place 0.25 mg under the tongue every 4 (four) hours as needed.    . INSULIN ASPART 100 UNIT/ML [place] SOLN Subcutaneous Inject 40 Units into the skin daily.      Marland Kitchen  INSULIN GLARGINE 100 UNIT/ML [place] SOLN Subcutaneous Inject 30 Units into the skin daily.     . ISOSORBIDE MONONITRATE ER 30 MG PO TB24 Oral Take 30 mg by mouth daily.    Marland Kitchen LEVOTHYROXINE SODIUM 75 MCG PO TABS Oral Take 75 mcg by mouth daily.    Marland Kitchen NITROGLYCERIN 0.4 MG SL SUBL Sublingual Place 1 tablet (0.4 mg total) under the tongue every 5 (five) minutes as needed for chest pain. 25 tablet 3  . ROSUVASTATIN CALCIUM 20 MG PO TABS Oral Take 20 mg by mouth at bedtime.      BP 143/71  Pulse 81  Temp(Src) 98.3 F (36.8 C) (Oral)  Resp 18  Ht 5\' 4"  (1.626 m)  Wt 213 lb (96.616 kg)  BMI 36.56 kg/m2  SpO2 98%  Physical Exam  Constitutional: She appears well-developed and well-nourished. No distress.  HENT:  Head: Normocephalic and atraumatic.  Eyes: Pupils are equal, round, and reactive to light.  Neck: Normal range of motion. Neck supple.  Cardiovascular: Normal rate, regular rhythm and normal heart  sounds.  Exam reveals no gallop and no friction rub.   No murmur heard. Pulmonary/Chest: Effort normal and breath sounds normal. No respiratory distress. She has no wheezes. She has no rales.  Abdominal: Soft.  Musculoskeletal:       The patient has 1+ pitting edema in both lower extremities    ED Course  Procedures (including critical care time)  Labs Reviewed - No data to display No results found.  Pt seen and assessed.  Will order labs.  The patient is in no distress and denies CP or SOB. NO exertional symptoms noted by the patient. Will place on Lasix 20mg  BID and will have her recheck with her doctor.  MDM   Date: 08/21/2011  Rate:67   Rhythm: normal sinus rhythm and premature ventricular contractions (PVC)  QRS Axis: normal  Intervals: normal  ST/T Wave abnormalities: nonspecific T wave changes  Conduction Disutrbances:none  Narrative Interpretation: PVCS noted  Old EKG Reviewed: unchanged          Carlyle Dolly, PA-C 08/22/11 [phone]

## 2011-08-23 NOTE — ED Provider Notes (Signed)
Medical screening examination/treatment/procedure(s) were performed by non-physician practitioner and as supervising physician I was immediately available for consultation/collaboration.   Shelda Jakes, MD 08/23/11 [phone]

## 2011-08-24 ENCOUNTER — Telehealth: Payer: Self-pay | Admitting: Gastroenterology

## 2011-08-24 NOTE — Telephone Encounter (Signed)
Pt states she thinks that the new medicine Dr. Arlyce Dice placed her on has been causing her feet and ankles to swell. The medication is domperidone. Pt wants to know if Dr. Arlyce Dice thinks this is the cause. Please advise.

## 2011-08-25 ENCOUNTER — Ambulatory Visit (INDEPENDENT_AMBULATORY_CARE_PROVIDER_SITE_OTHER): Payer: Medicare Other | Admitting: Physician Assistant

## 2011-08-25 ENCOUNTER — Encounter: Payer: Self-pay | Admitting: Physician Assistant

## 2011-08-25 VITALS — BP 110/72 | HR 75 | Ht 64.0 in | Wt 208.0 lb

## 2011-08-25 DIAGNOSIS — R609 Edema, unspecified: Secondary | ICD-10-CM | POA: Insufficient documentation

## 2011-08-25 DIAGNOSIS — Z951 Presence of aortocoronary bypass graft: Secondary | ICD-10-CM

## 2011-08-25 DIAGNOSIS — R0602 Shortness of breath: Secondary | ICD-10-CM

## 2011-08-25 MED ORDER — FUROSEMIDE 40 MG PO TABS: 40.0000 mg | ORAL_TABLET | Freq: Every day | ORAL | Status: DC

## 2011-08-25 NOTE — Assessment & Plan Note (Signed)
Will consolidate Lasix 20 twice a day to 40 every morning. Continue one banana daily, for K supplementation. Repeat labs, including BNP and TSH level. Given that this is bilateral, doubt this is due to DVT. Will order 2-D echo to rule out RV dysfunction. Will research this new medication, Domperidone, to see if LE edema is an adverse effect. Followup with me in one month for review of echo results, and further recommendations.

## 2011-08-25 NOTE — Telephone Encounter (Signed)
Possible but this would be a little unusual.  She can try holding it for 3-4 days.

## 2011-08-25 NOTE — Patient Instructions (Addendum)
Follow up in 1 month. Take Lasix (furosemide) 40 mg daily. Eat a banana daily. Your physician has requested that you have an echocardiogram. Echocardiography is a painless test that uses sound waves to create images of your heart. It provides your doctor with information about the size and shape of your heart and how well your heart's chambers and valves are working. This procedure takes approximately one hour. There are no restrictions for this procedure.

## 2011-08-25 NOTE — Assessment & Plan Note (Signed)
Stable on current medication regimen 

## 2011-08-25 NOTE — Progress Notes (Signed)
HPI: Patient presents today for evaluation of LE edema.  She was recently started on a new medication, Domperidone, for treatment of gastroparesis. She started this on March 14, with noted definite benefit for her abdominal discomfort and reflux symptoms, but also with development of new, bilateral LE edema. She became concerned and presented to [facility] ED on 4/1. Labs were within NL, UA was negative for proteinuria, and CXR indicated clear lungs. An ultrasound of the lower extremities was not done. Patient was discharged on low-dose Lasix. She reports definite improvement with the Lasix, but has some residual edema.  Clinically, she denies PND or orthopnea, but reports DOE. She denies any recent trauma or long trips.  Of note, patient has actually lost 11 pounds, since last OV in early January.   Allergies  Allergen Reactions  . Metformin Anaphylaxis  . Penicillins Anaphylaxis    Current Outpatient Prescriptions  Medication Sig Dispense Refill  . albuterol (PROVENTIL) (2.5 MG/3ML) 0.083% nebulizer solution Take 2.5 mg by nebulization every 4 (four) hours as needed. For wheezing.      Marland Kitchen ALPRAZolam (XANAX) 0.5 MG tablet Take 0.5 mg by mouth 2 (two) times daily.      . AMBULATORY NON FORMULARY MEDICATION Take 10 mg by mouth 4 (four) times daily. Medication Name: DOMPERIDONE 10MG  1 BY MOUTH 3 TIMES A DAY 30 MINUTES BEFORE MEALS AND AT BEDTIME  120 tablet  3  . aspirin 81 MG chewable tablet Chew 81 mg by mouth daily.      Marland Kitchen atenolol (TENORMIN) 25 MG tablet Take 25 mg by mouth daily.        . calcium carbonate (TUMS - DOSED IN MG ELEMENTAL CALCIUM) 500 MG chewable tablet Chew 3 tablets by mouth daily.        . Fluticasone-Salmeterol (ADVAIR DISKUS) 100-50 MCG/DOSE AEPB Inhale 1 puff into the lungs every 12 (twelve) hours.        . furosemide (LASIX) 20 MG tablet Take 1 tablet (20 mg total) by mouth 2 (two) times daily.  20 tablet  0  . hyoscyamine (LEVSIN SL) 0.125 MG SL tablet Place 0.25 mg under  the tongue every 4 (four) hours as needed.      . insulin aspart (NOVOLOG) 100 UNIT/ML injection Inject 40 Units into the skin daily.        . insulin glargine (LANTUS) 100 UNIT/ML injection Inject 30 Units into the skin daily.       . isosorbide mononitrate (IMDUR) 30 MG 24 hr tablet Take 30 mg by mouth daily.      Marland Kitchen levothyroxine (SYNTHROID, LEVOTHROID) 75 MCG tablet Take 75 mcg by mouth daily.      . nitroGLYCERIN (NITROSTAT) 0.4 MG SL tablet Place 1 tablet (0.4 mg total) under the tongue every 5 (five) minutes as needed for chest pain.  25 tablet  3  . rosuvastatin (CRESTOR) 20 MG tablet Take 20 mg by mouth at bedtime.        Past Medical History  Diagnosis Date  . Anxiety   . Asthma   . Diabetes mellitus     Scheduled to go on insulin pump  . History of heart attack   . GERD (gastroesophageal reflux disease)   . Esophageal stricture   . CAD (coronary artery disease)     4v CABG, 9/10; NL LVF, status post followup Cardiolite November 2012 no ischemia ejection fraction 65%  . Dyslipidemia      LDL 22 mg percent on Crestor  . Hypothyroidism   .  Hypertension     Past Surgical History  Procedure Date  . Angioplasty     with stenting   . Coronary artery bypass graft 01/26/2009    History   Social History  . Marital Status: Single    Spouse Name: N/A    Number of Children: 1  . Years of Education: N/A   Occupational History  . disabilied    Social History Main Topics  . Smoking status: Former Smoker -- 1.0 packs/day for 3 years    Types: Cigarettes    Quit date: 05/22/1993  . Smokeless tobacco: Never Used   Comment:  Year Quit: 1995  . Alcohol Use: No  . Drug Use: No  . Sexually Active: Not on file   Other Topics Concern  . Not on file   Social History Narrative  . No narrative on file    Family History  Problem Relation Age of Onset  . Colon cancer Neg Hx   . Heart disease Father   . Diabetes Mother     ROS: no nausea, vomiting; no fever, chills; no  melena, hematochezia; no claudication  PHYSICAL EXAM: BP 110/72  Pulse 75  Ht 5\' 4"  (1.626 m)  Wt 208 lb (94.348 kg)  BMI 35.70 kg/m2  SpO2 97% GENERAL: 46 year old female; NAD HEENT: NCAT, PERRLA, EOMI; sclera clear; no xanthelasma NECK: no JVD; no TM LUNGS: CTA bilaterally CARDIAC: RRR (S1, S2); no significant murmurs; no rubs or gallops ABDOMEN: Protuberant EXTREMETIES: intact distal pulses; 1+ bilateral peripheral edema SKIN: warm/dry; no obvious rash/lesions MUSCULOSKELETAL: no joint deformity NEURO: no focal deficit; NL affect   EKG:    ASSESSMENT & PLAN:

## 2011-08-28 NOTE — Telephone Encounter (Signed)
Spoke with pt and she is aware.

## 2011-08-29 ENCOUNTER — Other Ambulatory Visit: Payer: Self-pay | Admitting: *Deleted

## 2011-08-29 ENCOUNTER — Encounter: Payer: Self-pay | Admitting: *Deleted

## 2011-08-29 DIAGNOSIS — Z79899 Other long term (current) drug therapy: Secondary | ICD-10-CM

## 2011-08-29 DIAGNOSIS — E785 Hyperlipidemia, unspecified: Secondary | ICD-10-CM

## 2011-08-31 ENCOUNTER — Other Ambulatory Visit: Payer: Self-pay

## 2011-08-31 ENCOUNTER — Other Ambulatory Visit (INDEPENDENT_AMBULATORY_CARE_PROVIDER_SITE_OTHER): Payer: Medicare Other

## 2011-08-31 DIAGNOSIS — R609 Edema, unspecified: Secondary | ICD-10-CM

## 2011-08-31 DIAGNOSIS — I2510 Atherosclerotic heart disease of native coronary artery without angina pectoris: Secondary | ICD-10-CM

## 2011-09-04 ENCOUNTER — Telehealth: Payer: Self-pay | Admitting: *Deleted

## 2011-09-04 ENCOUNTER — Ambulatory Visit: Payer: Medicare Other | Admitting: Gastroenterology

## 2011-09-04 DIAGNOSIS — R7989 Other specified abnormal findings of blood chemistry: Secondary | ICD-10-CM

## 2011-09-04 DIAGNOSIS — Z79899 Other long term (current) drug therapy: Secondary | ICD-10-CM

## 2011-09-04 NOTE — Telephone Encounter (Signed)
Message copied by Murriel Hopper on Mon Sep 04, 2011  4:25 PM ------      Message from: Prescott Parma C      Created: Mon Aug 28, 2011 10:34 AM       TSH 0.08, therefore order free T3. O/w NL labs

## 2011-09-04 NOTE — Telephone Encounter (Signed)
Notes Recorded by Lesle Chris, LPN on 1/91/4782 at 4:23 PM Patient notified of results and verbalized understanding.

## 2011-09-04 NOTE — Telephone Encounter (Signed)
Message copied by Murriel Hopper on Mon Sep 04, 2011  4:23 PM ------      Message from: Prescott Parma C      Created: Mon Sep 04, 2011 10:56 AM       Echo done essentially to assess RVF, and this was NL. LVF remains NL (grade II diastolic dysfxn)

## 2011-09-04 NOTE — Telephone Encounter (Signed)
Notes Recorded by Lesle Chris, LPN on 1/61/0960 at 4:25 PM Patient notified and verbalized understanding. Will send order to [facility] & patient will do in a few days.  Also, requested patient do labs for cholesterol as it was time for follow up.

## 2011-09-12 ENCOUNTER — Telehealth: Payer: Self-pay | Admitting: *Deleted

## 2011-09-12 NOTE — Telephone Encounter (Signed)
Message copied by Murriel Hopper on Tue Sep 12, 2011  4:43 PM ------      Message from: Lewayne Bunting E      Created: Mon Sep 11, 2011  3:40 PM       Free T3 mildly elevated may need adjustment in her Synthroid by her primary care physicianpatient to discuss labs with her primary care physician, please forward these labs.

## 2011-09-12 NOTE — Telephone Encounter (Signed)
Notes Recorded by Lesle Chris, LPN on 1/61/0960 at 4:40 PM Patient notified and verbalized understanding. Will forward to PMD. Elizabeth Palau, PA with [facility]. Notes Recorded by Lesle Chris, LPN on 4/54/0981 at 11:34 AM Left message to return call.

## 2011-09-15 ENCOUNTER — Other Ambulatory Visit: Payer: Self-pay | Admitting: Cardiology

## 2011-09-20 ENCOUNTER — Other Ambulatory Visit: Payer: Self-pay | Admitting: Gastroenterology

## 2011-09-21 ENCOUNTER — Other Ambulatory Visit: Payer: Self-pay | Admitting: Gastroenterology

## 2011-09-21 NOTE — Telephone Encounter (Signed)
Pt aware her medication was called into her pharmacy l/m

## 2011-09-21 NOTE — Telephone Encounter (Signed)
Done....there are two phone notes for today

## 2011-09-22 ENCOUNTER — Ambulatory Visit: Payer: Medicare Other | Admitting: Physician Assistant

## 2011-09-29 ENCOUNTER — Ambulatory Visit (INDEPENDENT_AMBULATORY_CARE_PROVIDER_SITE_OTHER): Payer: Medicare Other | Admitting: Physician Assistant

## 2011-09-29 ENCOUNTER — Encounter: Payer: Self-pay | Admitting: Physician Assistant

## 2011-09-29 VITALS — BP 100/64 | HR 65 | Ht 64.0 in | Wt 213.0 lb

## 2011-09-29 DIAGNOSIS — E039 Hypothyroidism, unspecified: Secondary | ICD-10-CM

## 2011-09-29 DIAGNOSIS — R609 Edema, unspecified: Secondary | ICD-10-CM

## 2011-09-29 DIAGNOSIS — I2510 Atherosclerotic heart disease of native coronary artery without angina pectoris: Secondary | ICD-10-CM

## 2011-09-29 DIAGNOSIS — E119 Type 2 diabetes mellitus without complications: Secondary | ICD-10-CM

## 2011-09-29 DIAGNOSIS — E785 Hyperlipidemia, unspecified: Secondary | ICD-10-CM

## 2011-09-29 NOTE — Assessment & Plan Note (Signed)
Followed by primary M.D. Patient is on a statin. 

## 2011-09-29 NOTE — Assessment & Plan Note (Signed)
Patient remains quiescent on current medication regimen. We'll reassess clinical status with Dr. Andee Lineman in 6 months.

## 2011-09-29 NOTE — Patient Instructions (Signed)
Your physician wants you to follow-up in: 6 months. You will receive a reminder letter in the mail one-two months in advance. If you don't receive a letter, please call our office to schedule the follow-up appointment. Follow up with primary provider regarding thyroid lab abnormalities. Your physician recommends that you continue on your current medications as directed. Please refer to the Current Medication list given to you today.

## 2011-09-29 NOTE — Assessment & Plan Note (Signed)
Patient's pedal edema appears much improved, since last OV. Therefore, no change in current diuretic regimen. Recent followup labs indicated normal K and renal function. 2-D echo indicated normal RVF. Suspect this may, in fact, be given to recent addition of new gastroparesis medication, Domperidone, which has been very effective in suppressing abdominal symptoms.

## 2011-09-29 NOTE — Progress Notes (Addendum)
HPI: Patient presents for scheduled one month office followup.  When last seen, I ordered a 2-D echo to rule out RV dysfunction, given complaint of new, bilateral LE edema. This yielded normal LV and RVF. Followup labs were stable, with potassium 4.0, BUN 6, creatinine 0.6, and BNP 20.   Regarding the edema, I found that the new gastroparesis medication, Domperidone, does list pedal edema as possible side effect. I relayed this to the patient, and advised her to speak to her primary M.D., who placed her on this medication. She reports today that she was told to hold it for 3 days. Her edema did not improve, but her abdominal symptoms worsened. Therefore, she was advised to resume the medication, which has been very effective in suppressing her GI symptoms.  I also ordered TSH/free T3 levels, given that she is on thyroid replacement. TSH was 0.08, with elevated free T3 of 4.9.  Although she has gained 5 pounds since last OV, she continues to report no PND, orthopnea, or DOE. She also continues to remain stable with respect to her CAD, reporting no interim exertional CP.    Allergies  Allergen Reactions  . Metformin Anaphylaxis  . Penicillins Anaphylaxis    Current Outpatient Prescriptions  Medication Sig Dispense Refill  . albuterol (PROVENTIL) (2.5 MG/3ML) 0.083% nebulizer solution Take 2.5 mg by nebulization every 4 (four) hours as needed. For wheezing.      Marland Kitchen ALPRAZolam (XANAX) 0.5 MG tablet Take 0.5 mg by mouth 2 (two) times daily.      . AMBULATORY NON FORMULARY MEDICATION Take 10 mg by mouth 4 (four) times daily. Medication Name: DOMPERIDONE 10MG  1 BY MOUTH 3 TIMES A DAY 30 MINUTES BEFORE MEALS AND AT BEDTIME  120 tablet  3  . aspirin 81 MG chewable tablet Chew 81 mg by mouth daily.      Marland Kitchen atenolol (TENORMIN) 25 MG tablet TAKE 1 TABLET BY MOUTH ONCE A DAY  90 tablet  4  . calcium carbonate (TUMS - DOSED IN MG ELEMENTAL CALCIUM) 500 MG chewable tablet Chew 3 tablets by mouth daily.         . Fluticasone-Salmeterol (ADVAIR DISKUS) 100-50 MCG/DOSE AEPB Inhale 1 puff into the lungs every 12 (twelve) hours.        . furosemide (LASIX) 20 MG tablet Take 1 tablet (20 mg total) by mouth 2 (two) times daily.  20 tablet  0  . furosemide (LASIX) 40 MG tablet Take 1 tablet (40 mg total) by mouth daily.  30 tablet  6  . hyoscyamine (LEVSIN SL) 0.125 MG SL tablet Place 0.25 mg under the tongue every 4 (four) hours as needed.      . insulin aspart (NOVOLOG) 100 UNIT/ML injection Inject 40 Units into the skin daily.        . insulin glargine (LANTUS) 100 UNIT/ML injection Inject 30 Units into the skin daily.       . isosorbide mononitrate (IMDUR) 30 MG 24 hr tablet Take 30 mg by mouth daily.      Marland Kitchen levothyroxine (SYNTHROID, LEVOTHROID) 75 MCG tablet Take 75 mcg by mouth daily.      . nitroGLYCERIN (NITROSTAT) 0.4 MG SL tablet Place 1 tablet (0.4 mg total) under the tongue every 5 (five) minutes as needed for chest pain.  25 tablet  3  . rosuvastatin (CRESTOR) 20 MG tablet Take 20 mg by mouth at bedtime.        Past Medical History  Diagnosis Date  . Anxiety   .  Asthma   . Diabetes mellitus     Scheduled to go on insulin pump  . History of heart attack   . GERD (gastroesophageal reflux disease)   . Esophageal stricture   . CAD (coronary artery disease)     4v CABG, 9/10; NL LVF, status post followup Cardiolite November 2012 no ischemia ejection fraction 65%  . Dyslipidemia      LDL 22 mg percent on Crestor  . Hypothyroidism   . Hypertension     Past Surgical History  Procedure Date  . Angioplasty     with stenting   . Coronary artery bypass graft 01/26/2009    History   Social History  . Marital Status: Single    Spouse Name: N/A    Number of Children: 1  . Years of Education: N/A   Occupational History  . disabilied    Social History Main Topics  . Smoking status: Former Smoker -- 1.0 packs/day for 3 years    Types: Cigarettes    Quit date: 05/22/1993  . Smokeless  tobacco: Never Used   Comment:  Year Quit: 1995  . Alcohol Use: No  . Drug Use: No  . Sexually Active: Not on file   Other Topics Concern  . Not on file   Social History Narrative  . No narrative on file   Social History Narrative  . No narrative on file    Problem Relation Age of Onset  . Colon cancer Neg Hx   . Heart disease Father   . Diabetes Mother     ROS: no nausea, vomiting; no fever, chills; no melena, hematochezia; no claudication  PHYSICAL EXAM: BP 100/64  Pulse 65  Ht 5\' 4"  (1.626 m)  Wt 213 lb (96.616 kg)  BMI 36.56 kg/m2  SpO2 99% GENERAL: 46 year old female; NAD  HEENT: NCAT, PERRLA, EOMI; sclera clear; no xanthelasma  NECK: no JVD; no TM  LUNGS: CTA bilaterally  CARDIAC: RRR (S1, S2); no significant murmurs; no rubs or gallops  ABDOMEN: Protuberant  EXTREMETIES: intact distal pulses; 1+ bilateral peripheral edema  SKIN: warm/dry; no obvious rash/lesions  MUSCULOSKELETAL: no joint deformity  NEURO: no focal deficit; NL affect    EKG: reviewed and available in Electronic Records   ASSESSMENT & PLAN:  CORONARY ARTERY DISEASE Patient remains quiescent on current medication regimen. We'll reassess clinical status with Dr. Andee Lineman in 6 months.  Edema Patient's pedal edema appears much improved, since last OV. Therefore, no change in current diuretic regimen. Recent followup labs indicated normal K and renal function. 2-D echo indicated normal RVF. Suspect this may, in fact, be given to recent addition of new gastroparesis medication, Domperidone, which has been very effective in suppressing abdominal symptoms.   Hypothyroidism Recent thyroid panel (TSH 0.08/free T3 4.9), will be forwarded to her primary MDs office, for review and further recommendations.  DIABETES MELLITUS Followed by primary M.D. Patient is on a statin.  DYSLIPIDEMIA Will reassess lipid status with a FLP/LFT profile. Target LDL 70 or less, if feasible. Continue current dose of  Crestor, pending further recommendations.     Gene Osby Sweetin, PAC  Patient seen and examined with Gene Bronsen Serano, PA-C.  Counseling was provided regarding the current medical condition and included: . Diagnosis, impressions, prognosis, recommended diagnostic studies  . Risks and benefits of treatment options  . Instructions for management, treatment and/or follow-up care  . Importance of compliance with treatment, risk factor reduction  . Patient and/or family education    Time spent counseling  was 15 minutes and recorded in the Problem List documeted by Gene Clarice Bonaventure , PA-C   Alvin Critchley [facility] 10/02/2011 Peyton Bottoms 12:57 PM

## 2011-09-29 NOTE — Assessment & Plan Note (Signed)
Recent thyroid panel (TSH 0.08/free T3 4.9), will be forwarded to her primary MDs office, for review and further recommendations.

## 2011-09-29 NOTE — Assessment & Plan Note (Signed)
Will reassess lipid status with a FLP/LFT profile. Target LDL 70 or less, if feasible. Continue current dose of Crestor, pending further recommendations.

## 2011-11-09 ENCOUNTER — Ambulatory Visit: Payer: Medicare Other | Admitting: Cardiology

## 2011-11-10 ENCOUNTER — Other Ambulatory Visit: Payer: Self-pay | Admitting: *Deleted

## 2011-11-10 MED ORDER — ROSUVASTATIN CALCIUM 20 MG PO TABS: 20.0000 mg | ORAL_TABLET | Freq: Every day | ORAL | Status: DC

## 2011-11-22 ENCOUNTER — Other Ambulatory Visit: Payer: Self-pay | Admitting: Gastroenterology

## 2011-11-22 NOTE — Telephone Encounter (Signed)
Called pt to inform we do not carry samples of Domperidone

## 2011-12-20 ENCOUNTER — Telehealth: Payer: Self-pay | Admitting: Gastroenterology

## 2011-12-20 NOTE — Telephone Encounter (Signed)
Dr Arlyce Dice, May I refill Pts Xanax??

## 2011-12-22 ENCOUNTER — Other Ambulatory Visit: Payer: Self-pay | Admitting: Gastroenterology

## 2011-12-22 ENCOUNTER — Telehealth: Payer: Self-pay | Admitting: Gastroenterology

## 2011-12-22 NOTE — Telephone Encounter (Signed)
MEDICATION FILLED-PREVIOUS CALLS TO THE OFFICE I DID NOT RECEIVE PHONE NOTE WENT TO ROBERT Romulus Hanrahan INSTEAD OF ME

## 2012-01-04 NOTE — Telephone Encounter (Signed)
See message from 12-22-11

## 2012-02-06 DIAGNOSIS — Z9641 Presence of insulin pump (external) (internal): Secondary | ICD-10-CM | POA: Insufficient documentation

## 2012-02-14 ENCOUNTER — Ambulatory Visit (INDEPENDENT_AMBULATORY_CARE_PROVIDER_SITE_OTHER): Payer: Medicare Other | Admitting: Gastroenterology

## 2012-02-14 ENCOUNTER — Encounter: Payer: Self-pay | Admitting: Gastroenterology

## 2012-02-14 VITALS — BP 100/60 | HR 68 | Ht 64.0 in | Wt 211.2 lb

## 2012-02-14 DIAGNOSIS — R1011 Right upper quadrant pain: Secondary | ICD-10-CM | POA: Insufficient documentation

## 2012-02-14 MED ORDER — HYOSCYAMINE SULFATE 0.125 MG SL SUBL: 0.2500 mg | SUBLINGUAL_TABLET | SUBLINGUAL | Status: DC | PRN

## 2012-02-14 MED ORDER — FAMOTIDINE 40 MG PO TABS: 40.0000 mg | ORAL_TABLET | Freq: Every day | ORAL | Status: DC

## 2012-02-14 MED ORDER — AMBULATORY NON FORMULARY MEDICATION: Status: DC

## 2012-02-14 NOTE — Assessment & Plan Note (Addendum)
Although pain seems to improve with eating and with hyomax, exam is pertinent for moderate tenderness over the ribs at the right costal margin which, she claims, reproduces the pain. This suggests that pain is due to musculoskeletal pain.  Recommendations #1 ibuprofen when necessary #2 Pepcid 40 mg daily because of GI component of pain #3 if symptoms are not improved will proceed with abdominal ultrasound

## 2012-02-14 NOTE — Patient Instructions (Addendum)
We have sent the following medications to your pharmacy for you to pick up at your convenience: Hyoscyamine We have sent a prescription for Domperidone to [facility] in [place], [place] for you. Please follow up in one year with Dr. Arlyce Dice

## 2012-02-14 NOTE — Progress Notes (Signed)
History of Present Illness:  [name] is here for evaluation of abdominal pain. For the last couple of months she's been complaining of right upper quadrant pain. This usually occurs on an empty stomach and is lessened with eating. It occasionally radiates to the back. It may be moderately severe. She's on no gastric irritants including nonsteroidals. She denies nausea. She remains on domperidone for gastroparesis.    Review of Systems: Pertinent positive and negative review of systems were noted in the above HPI section. All other review of systems were otherwise negative.    Current Medications, Allergies, Past Medical History, Past Surgical History, Family History and Social History were reviewed in [employer] electronic medical record  Vital signs were reviewed in today's medical record. Physical Exam: General: Well developed , well nourished, no acute distress Head: Normocephalic and atraumatic Eyes:  sclerae anicteric, EOMI Ears: Normal auditory acuity Mouth: No deformity or lesions Lungs: Clear throughout to auscultation Heart: Regular rate and rhythm; no murmurs, rubs or bruits Abdomen: Soft, non tender and non distended. No masses, hepatosplenomegaly or hernias noted. Normal Bowel sounds Rectal:deferred Musculoskeletal: Symmetrical with no gross deformities. She is moderately severe tenderness to palpation over the right ribs at the costal margin. Tenderness reproduces the pain that she has been experiencing. Pulses:  Normal pulses noted Extremities: No clubbing, cyanosis, edema or deformities noted Neurological: Alert oriented x 4, grossly nonfocal Psychological:  Alert and cooperative. Normal mood and affect

## 2012-02-15 ENCOUNTER — Telehealth: Payer: Self-pay | Admitting: Gastroenterology

## 2012-02-15 NOTE — Telephone Encounter (Signed)
Patient can crush tablet per pharmacist. Patient was inform by pharmacy yesterday,. Tried to contact patient

## 2012-02-19 DIAGNOSIS — Z0271 Encounter for disability determination: Secondary | ICD-10-CM

## 2012-02-29 ENCOUNTER — Telehealth: Payer: Self-pay | Admitting: Physician Assistant

## 2012-02-29 NOTE — Telephone Encounter (Signed)
Patient is coming in 11/20 for her 6 mo f/u.  She said that she is experiencing a lot of muscle/body pain and weakness.  She want to know if it could be coming from her medications.

## 2012-02-29 NOTE — Telephone Encounter (Signed)
Discussed below with patient.  She is questioning her Crestor.  States she has been on this for about 2 years now.  States she feels this mostly in her legs, especially with getting up out of chair.  Also, says she can't leave her arms up for very long either due to weak feeling.  Informed message will be sent to PA for review, but may need to be evaluated by PMD.  Patient verbalized understanding.

## 2012-02-29 NOTE — Telephone Encounter (Signed)
Recommend d/cing Crestor, and monitoring for improvement in sxs. If they improve, she can DC Crestor altogether. We can then discuss at OV whether or not to try a different agent

## 2012-03-01 MED ORDER — ROSUVASTATIN CALCIUM 20 MG PO TABS: ORAL_TABLET | ORAL | Status: DC

## 2012-03-01 NOTE — Telephone Encounter (Signed)
Left message to return call 

## 2012-03-01 NOTE — Telephone Encounter (Signed)
Patient notified of below.   

## 2012-03-05 DIAGNOSIS — F4323 Adjustment disorder with mixed anxiety and depressed mood: Secondary | ICD-10-CM | POA: Insufficient documentation

## 2012-03-21 ENCOUNTER — Other Ambulatory Visit: Payer: Self-pay | Admitting: Gastroenterology

## 2012-03-22 NOTE — Telephone Encounter (Signed)
Called in med 

## 2012-03-22 NOTE — Telephone Encounter (Signed)
Called in medication today and left message for pt

## 2012-03-25 ENCOUNTER — Telehealth: Payer: Self-pay | Admitting: Physician Assistant

## 2012-03-25 ENCOUNTER — Telehealth: Payer: Self-pay | Admitting: *Deleted

## 2012-03-25 NOTE — Telephone Encounter (Signed)
Discussed below with patient.  States she really feels like this is muscle & not cardiac.  Patient states area is just up from armpit & feels very sore & swelling seems to be worse.  No cardiac complaints.  Advised patient that she should see her PMD first for evaluation.  If they feel cardiac eval is warranted, then she should call the office back.  She verbalized understanding.

## 2012-03-25 NOTE — Telephone Encounter (Signed)
PMD faxed EKG done in office today.  Wanted [place] to review as they did not have anything recent to compare to.    Gene Serpe, PA - reviewed EKG & compared to previous EKG done in April.  No significant change c/w 08/2011.    Elizabeth Palau, FNP notified of above.  EKG from April faxed back as well so they will have in the future for comparison.

## 2012-03-25 NOTE — Telephone Encounter (Signed)
Patient is hurting in her chest for past three weeks. Swollen place on left side of chest. Said hurts like a tootache She was asking to be seen today

## 2012-04-10 ENCOUNTER — Encounter: Payer: Self-pay | Admitting: Physician Assistant

## 2012-04-10 ENCOUNTER — Ambulatory Visit (INDEPENDENT_AMBULATORY_CARE_PROVIDER_SITE_OTHER): Payer: Medicare Other | Admitting: Physician Assistant

## 2012-04-10 VITALS — BP 111/63 | HR 59 | Ht 64.0 in | Wt 217.8 lb

## 2012-04-10 DIAGNOSIS — I2510 Atherosclerotic heart disease of native coronary artery without angina pectoris: Secondary | ICD-10-CM

## 2012-04-10 DIAGNOSIS — E785 Hyperlipidemia, unspecified: Secondary | ICD-10-CM

## 2012-04-10 DIAGNOSIS — E039 Hypothyroidism, unspecified: Secondary | ICD-10-CM

## 2012-04-10 DIAGNOSIS — E119 Type 2 diabetes mellitus without complications: Secondary | ICD-10-CM

## 2012-04-10 MED ORDER — ISOSORBIDE MONONITRATE ER 60 MG PO TB24: 60.0000 mg | ORAL_TABLET | Freq: Every day | ORAL | Status: DC

## 2012-04-10 MED ORDER — ROSUVASTATIN CALCIUM 10 MG PO TABS: 5.0000 mg | ORAL_TABLET | Freq: Every evening | ORAL | Status: DC

## 2012-04-10 MED ORDER — NITROGLYCERIN 0.4 MG SL SUBL: 0.4000 mg | SUBLINGUAL_TABLET | SUBLINGUAL | Status: DC | PRN

## 2012-04-10 NOTE — Assessment & Plan Note (Addendum)
Will increase Imdur to 60 mg daily, for more aggressive management of chronic stable angina. Recommend reassessment of clinical status in 3 months, or sooner if needed. We'll also renew prescription for NTG tablets. Of note, patient is considering whether or not to continue regular followup with Dr. Andee Lineman, with whom she had followed for several years.

## 2012-04-10 NOTE — Progress Notes (Signed)
Primary Cardiologist:  HPI: Patient presents for scheduled six-month followup.  Since last OV in May, 2013, patient denies any exacerbation of her pattern of chronic stable angina. She denies any chest discomfort at rest. She has not had to use any NTG tablets. She does have occasional chest "burning", but only if walking up a steep incline or a full flight of stairs. Her symptoms resolve spontaneously with rest.  She recently took herself off Crestor, secondary to significant myalgia. Of note, she had been on this medication since undergoing CABG in 2010. After stopping the Crestor, her myalgia in both her arms and legs completely resolved. Of note, she had an LDL of 43, back in April.  Patient also recently started on an insulin pump, with reported significant improvement in her serum glucose levels. She is also requiring much less insulin.   Allergies  Allergen Reactions  . Metformin Anaphylaxis  . Penicillins Anaphylaxis    Current Outpatient Prescriptions  Medication Sig Dispense Refill  . albuterol (PROVENTIL) (2.5 MG/3ML) 0.083% nebulizer solution Take 2.5 mg by nebulization every 4 (four) hours as needed. For wheezing.      Marland Kitchen ALPRAZolam (XANAX) 0.5 MG tablet TAKE 1 TABLET TWICE A DAY  60 tablet  2  . AMBULATORY NON FORMULARY MEDICATION Take 10 mg by mouth 4 (four) times daily. Medication Name: DOMPERIDONE 10MG  1 BY MOUTH 3 TIMES A DAY 30 MINUTES BEFORE MEALS AND AT BEDTIME  120 tablet  3  . aspirin 81 MG chewable tablet Chew 81 mg by mouth daily.      Marland Kitchen atenolol (TENORMIN) 25 MG tablet TAKE 1 TABLET BY MOUTH ONCE A DAY  90 tablet  4  . calcium carbonate (TUMS - DOSED IN MG ELEMENTAL CALCIUM) 500 MG chewable tablet Chew 3 tablets by mouth daily.        . Fluticasone-Salmeterol (ADVAIR DISKUS) 100-50 MCG/DOSE AEPB Inhale 1 puff into the lungs every 12 (twelve) hours.        . hyoscyamine (LEVSIN SL) 0.125 MG SL tablet Place 2 tablets (0.25 mg total) under the tongue every 4 (four)  hours as needed.  30 tablet  6  . insulin aspart (NOVOLOG) 100 UNIT/ML injection Inject 43 Units into the skin daily. Insulin pump      . isosorbide mononitrate (IMDUR) 30 MG 24 hr tablet Take 30 mg by mouth daily.      Marland Kitchen levothyroxine (SYNTHROID, LEVOTHROID) 88 MCG tablet Take 88 mcg by mouth daily.      . nitroGLYCERIN (NITROSTAT) 0.4 MG SL tablet Place 1 tablet (0.4 mg total) under the tongue every 5 (five) minutes as needed for chest pain.  25 tablet  3  . rosuvastatin (CRESTOR) 20 MG tablet CURRENTLY ON HOLD AS OF 03/01/2012.        Past Medical History  Diagnosis Date  . Anxiety   . Asthma   . Diabetes mellitus     Scheduled to go on insulin pump  . History of heart attack   . GERD (gastroesophageal reflux disease)   . Esophageal stricture   . CAD (coronary artery disease)     4v CABG, 9/10; NL LVF, status post followup Cardiolite November 2012 no ischemia ejection fraction 65%  . Dyslipidemia      LDL 22 mg percent on Crestor  . Hypothyroidism   . Hypertension     Past Surgical History  Procedure Date  . Angioplasty     with stenting   . Coronary artery bypass graft 01/26/2009  History   Social History  . Marital Status: Single    Spouse Name: N/A    Number of Children: 1  . Years of Education: N/A   Occupational History  . disabilied    Social History Main Topics  . Smoking status: Former Smoker -- 1.0 packs/day for 3 years    Types: Cigarettes    Quit date: 05/22/1993  . Smokeless tobacco: Never Used     Comment:  Year Quit: 1995  . Alcohol Use: No  . Drug Use: No  . Sexually Active: Not on file   Other Topics Concern  . Not on file   Social History Narrative  . No narrative on file    Family History  Problem Relation Age of Onset  . Colon cancer Neg Hx   . Heart disease Father   . Diabetes Mother     ROS: no nausea, vomiting; no fever, chills; no melena, hematochezia; no claudication  PHYSICAL EXAM: BP 111/63  Pulse 59  Ht 5\' 4"  (1.626  m)  Wt 217 lb 12.8 oz (98.793 kg)  BMI 37.39 kg/m2  SpO2 98% GENERAL: 46 year old female, moderately obese; NAD HEENT: NCAT, PERRLA, EOMI; sclera clear; no xanthelasma NECK: palpable bilateral carotid pulses, no bruits; no JVD; no TM LUNGS: CTA bilaterally CARDIAC: RRR (S1, S2); no significant murmurs; no rubs or gallops ABDOMEN: Protuberant EXTREMETIES: intact distal pulses; no significant peripheral edema SKIN: warm/dry; no obvious rash/lesions MUSCULOSKELETAL: no joint deformity NEURO: no focal deficit; NL affect   EKG:    ASSESSMENT & PLAN:  CORONARY ARTERY DISEASE Will increase Imdur to 60 mg daily, for more aggressive management of chronic stable angina. Recommend reassessment of clinical status in 3 months, or sooner if needed. We'll also renew prescription for NTG tablets. Of note, patient is considering whether or not to continue regular followup with Dr. Andee Lineman, with whom she had followed for several years.  DYSLIPIDEMIA Patient has agreed to be rechallenged with Crestor, albeit at a lower dose. I recommended starting at 5 mg daily, and reassessing lipid status in 12 weeks.  DIABETES MELLITUS Followed by primary M.D.  Hypothyroidism Followed by primary M.D.    Gene Winferd Wease, PAC

## 2012-04-10 NOTE — Patient Instructions (Addendum)
   Resume Crestor at 10mg  - 1/2 tab every evening  Nitroglycerin as needed for severe chest pain only   Increase Imdur to 60mg  daily (may take 2 of your 30mg  tabs till finish current supply)  All meds above sent to pharmacy Continue all other current medications. Labs in 12 weeks for fasting lipid and liver panel - order provided today Office will contact with results Follow up in  3 months

## 2012-04-10 NOTE — Assessment & Plan Note (Signed)
Followed by primary M.D. 

## 2012-04-10 NOTE — Assessment & Plan Note (Signed)
Patient has agreed to be rechallenged with Crestor, albeit at a lower dose. I recommended starting at 5 mg daily, and reassessing lipid status in 12 weeks.

## 2012-05-01 ENCOUNTER — Telehealth: Payer: Self-pay | Admitting: Gastroenterology

## 2012-05-01 NOTE — Telephone Encounter (Signed)
Dr Arlyce Dice please advise

## 2012-05-02 NOTE — Telephone Encounter (Signed)
Ok to take

## 2012-05-02 NOTE — Telephone Encounter (Signed)
Pt aware ok to take carafate per Dr Arlyce Dice

## 2012-06-03 ENCOUNTER — Other Ambulatory Visit: Payer: Self-pay | Admitting: Gastroenterology

## 2012-06-03 MED ORDER — AMBULATORY NON FORMULARY MEDICATION: Status: DC

## 2012-06-03 NOTE — Telephone Encounter (Signed)
Script faxed to compounding pharmacy.

## 2012-06-11 ENCOUNTER — Telehealth: Payer: Self-pay | Admitting: Gastroenterology

## 2012-06-11 NOTE — Telephone Encounter (Signed)
Pt requests to be seen for abdominal pain and bloating. Pt states she is taking domperidone but she states her belly looks like she is pregnant she is so bloated. Pt also states she has gained quite a bit of weight and she has not changed her eating habits. Pt scheduled to see Dr. Arlyce Dice tomorrow at 8:30am. Pt aware of appt date and time.

## 2012-06-12 ENCOUNTER — Other Ambulatory Visit (INDEPENDENT_AMBULATORY_CARE_PROVIDER_SITE_OTHER): Payer: Medicare Other

## 2012-06-12 ENCOUNTER — Encounter: Payer: Self-pay | Admitting: Gastroenterology

## 2012-06-12 ENCOUNTER — Ambulatory Visit (INDEPENDENT_AMBULATORY_CARE_PROVIDER_SITE_OTHER): Payer: Medicare Other | Admitting: Gastroenterology

## 2012-06-12 VITALS — BP 122/80 | HR 59 | Ht 64.0 in | Wt 225.0 lb

## 2012-06-12 DIAGNOSIS — K3184 Gastroparesis: Secondary | ICD-10-CM

## 2012-06-12 DIAGNOSIS — R635 Abnormal weight gain: Secondary | ICD-10-CM

## 2012-06-12 DIAGNOSIS — R140 Abdominal distension (gaseous): Secondary | ICD-10-CM

## 2012-06-12 DIAGNOSIS — R143 Flatulence: Secondary | ICD-10-CM

## 2012-06-12 DIAGNOSIS — R141 Gas pain: Secondary | ICD-10-CM

## 2012-06-12 DIAGNOSIS — R1013 Epigastric pain: Secondary | ICD-10-CM

## 2012-06-12 LAB — TSH: TSH: 2.11 u[IU]/mL (ref 0.35–5.50)

## 2012-06-12 NOTE — Assessment & Plan Note (Signed)
Gastric emptying scan has been negative x2. She's not clearly benefiting from domperidone. We'll discontinue.

## 2012-06-12 NOTE — Progress Notes (Signed)
History of Present Illness:  Mrs. Lacorte has returned for evaluation of bloating. She complains of postprandial bloating and distention. She's had a profound weight gain in the last 2 years of almost 65 pounds. She denies nausea. In March, 2013 she was empirically started on domperidone because of similar symptoms. Gastric emptying scan has been normal in the past. She is on thyroid medication.    Review of Systems: Pertinent positive and negative review of systems were noted in the above HPI section. All other review of systems were otherwise negative.    Current Medications, Allergies, Past Medical History, Past Surgical History, Family History and Social History were reviewed in [employer] electronic medical record  Vital signs were reviewed in today's medical record. Physical Exam: she is an obese female at abdomen is without masses, tenderness organomegaly. There is no succussion splash  General: Well developed , well nourished, no acute distress

## 2012-06-12 NOTE — Assessment & Plan Note (Signed)
Postprandial bloating may relate to a weight gain. Nonulcer dyspepsia is also a consideration. I do not think this is due to 2 gastroparesis since this has not been proven. She claims that her dietary habits have not changed but she has gained 65 pounds in the past 2 years.  She rarely takes hyomax.  Recommendations #1 discontinue domperidone #2 check thyroid function tests

## 2012-06-12 NOTE — Patient Instructions (Addendum)
You will go to the basement for labs today  

## 2012-06-14 ENCOUNTER — Encounter (INDEPENDENT_AMBULATORY_CARE_PROVIDER_SITE_OTHER): Payer: Medicare Other | Admitting: Ophthalmology

## 2012-06-14 DIAGNOSIS — H3581 Retinal edema: Secondary | ICD-10-CM

## 2012-06-14 DIAGNOSIS — H2510 Age-related nuclear cataract, unspecified eye: Secondary | ICD-10-CM

## 2012-06-14 DIAGNOSIS — E11319 Type 2 diabetes mellitus with unspecified diabetic retinopathy without macular edema: Secondary | ICD-10-CM

## 2012-06-14 DIAGNOSIS — I10 Essential (primary) hypertension: Secondary | ICD-10-CM

## 2012-06-14 DIAGNOSIS — H43819 Vitreous degeneration, unspecified eye: Secondary | ICD-10-CM

## 2012-06-14 DIAGNOSIS — E11359 Type 2 diabetes mellitus with proliferative diabetic retinopathy without macular edema: Secondary | ICD-10-CM

## 2012-06-14 DIAGNOSIS — H35039 Hypertensive retinopathy, unspecified eye: Secondary | ICD-10-CM

## 2012-06-14 DIAGNOSIS — E1039 Type 1 diabetes mellitus with other diabetic ophthalmic complication: Secondary | ICD-10-CM

## 2012-07-03 ENCOUNTER — Encounter: Payer: Self-pay | Admitting: Physician Assistant

## 2012-07-03 ENCOUNTER — Encounter: Payer: Self-pay | Admitting: *Deleted

## 2012-07-03 ENCOUNTER — Ambulatory Visit (INDEPENDENT_AMBULATORY_CARE_PROVIDER_SITE_OTHER): Payer: Medicare Other | Admitting: Physician Assistant

## 2012-07-03 ENCOUNTER — Ambulatory Visit: Payer: Medicare Other | Admitting: Physician Assistant

## 2012-07-03 VITALS — BP 116/72 | HR 74 | Ht 64.0 in | Wt 225.0 lb

## 2012-07-03 DIAGNOSIS — E785 Hyperlipidemia, unspecified: Secondary | ICD-10-CM

## 2012-07-03 DIAGNOSIS — I2510 Atherosclerotic heart disease of native coronary artery without angina pectoris: Secondary | ICD-10-CM

## 2012-07-03 MED ORDER — OMEGA-3 FATTY ACIDS 1000 MG PO CAPS: 2.0000 g | ORAL_CAPSULE | Freq: Two times a day (BID) | ORAL | Status: DC

## 2012-07-03 MED ORDER — ROSUVASTATIN CALCIUM 5 MG PO TABS: 5.0000 mg | ORAL_TABLET | Freq: Every evening | ORAL | Status: DC

## 2012-07-03 NOTE — Assessment & Plan Note (Signed)
Results of her recent lipid profile were reviewed with the patient: LDL 34, triglycerides 300. When last seen, her placed her back on Crestor at 10 mg daily, 1/2 her previous dose. She is better able to tolerate this, but only by taking it every other day. Therefore, I recommended decreasing Crestor to 5 mg daily. Additionally, I will place her on 2 g omega-3 fish oil twice a day, for treatment of her hypertriglyceridemia. I also advised her to decrease her total carbohydrate intake.

## 2012-07-03 NOTE — Progress Notes (Addendum)
Primary Cardiologist: Jerral Bonito, MD (new)   HPI: Scheduled 3 month followup.  Since her last OV, she suggests some slight progression in her chronic angina pattern. She has even had some brief "burning" at rest, but only on rare occasion. This is more typically induced by moderate exertion, not when walking on level ground. She has not had to take any NTG since last OV. Also, she was not able to tolerate the increased dose of Imdur 60 mg daily, as I had recommended at time of last OV. She states that she has difficulty swallowing this pill and crushing it, as she has to do with her other pills, is not recommended.  Allergies  Allergen Reactions  . Metformin Anaphylaxis  . Penicillins Anaphylaxis    Current Outpatient Prescriptions  Medication Sig Dispense Refill  . albuterol (PROVENTIL) (2.5 MG/3ML) 0.083% nebulizer solution Take 2.5 mg by nebulization every 4 (four) hours as needed. For wheezing.      Marland Kitchen ALPRAZolam (XANAX) 0.5 MG tablet TAKE 1 TABLET TWICE A DAY  60 tablet  2  . AMBULATORY NON FORMULARY MEDICATION Take 10 mg by mouth 4 (four) times daily. Medication Name: DOMPERIDONE 10MG  1 BY MOUTH 3 TIMES A DAY 30 MINUTES BEFORE MEALS AND AT BEDTIME  120 tablet  3  . aspirin 81 MG chewable tablet Chew 81 mg by mouth daily.      Marland Kitchen atenolol (TENORMIN) 25 MG tablet TAKE 1 TABLET BY MOUTH ONCE A DAY  90 tablet  4  . calcium carbonate (TUMS - DOSED IN MG ELEMENTAL CALCIUM) 500 MG chewable tablet Chew 3 tablets by mouth daily.        . Fluticasone-Salmeterol (ADVAIR DISKUS) 100-50 MCG/DOSE AEPB Inhale 1 puff into the lungs as needed.       . hyoscyamine (LEVSIN SL) 0.125 MG SL tablet Place 2 tablets (0.25 mg total) under the tongue every 4 (four) hours as needed.  30 tablet  6  . insulin aspart (NOVOLOG) 100 UNIT/ML injection Inject 43 Units into the skin daily. Insulin pump      . isosorbide mononitrate (IMDUR) 60 MG 24 hr tablet Take 60 mg by mouth daily.       Marland Kitchen levothyroxine (SYNTHROID,  LEVOTHROID) 88 MCG tablet Take 88 mcg by mouth daily.      . nitroGLYCERIN (NITROSTAT) 0.4 MG SL tablet Place 1 tablet (0.4 mg total) under the tongue every 5 (five) minutes as needed for chest pain.  25 tablet  3  . rosuvastatin (CRESTOR) 5 MG tablet Take 1 tablet (5 mg total) by mouth every evening.  30 tablet  6  . fish oil-omega-3 fatty acids 1000 MG capsule Take 2 capsules (2 g total) by mouth 2 (two) times daily.       No current facility-administered medications for this visit.    Past Medical History  Diagnosis Date  . Anxiety   . Asthma   . Diabetes mellitus     Scheduled to go on insulin pump  . History of heart attack   . GERD (gastroesophageal reflux disease)   . Esophageal stricture   . CAD (coronary artery disease)     4v CABG, 9/10; NL LVF, status post followup Cardiolite November 2012 no ischemia ejection fraction 65%  . Dyslipidemia      LDL 22 mg percent on Crestor  . Hypothyroidism   . Hypertension     Past Surgical History  Procedure Laterality Date  . Angioplasty      with  stenting   . Coronary artery bypass graft  01/26/2009    History   Social History  . Marital Status: Single    Spouse Name: N/A    Number of Children: 1  . Years of Education: N/A   Occupational History  . disabilied    Social History Main Topics  . Smoking status: Former Smoker -- 1.00 packs/day for 3 years    Types: Cigarettes    Quit date: 05/22/1993  . Smokeless tobacco: Never Used     Comment:  Year Quit: 1995  . Alcohol Use: No  . Drug Use: No  . Sexually Active: Not on file   Other Topics Concern  . Not on file   Social History Narrative  . No narrative on file    Family History  Problem Relation Age of Onset  . Colon cancer Neg Hx   . Heart disease Father   . Diabetes Mother     ROS: no nausea, vomiting; no fever, chills; no melena, hematochezia; no claudication  PHYSICAL EXAM: BP 116/72  Pulse 74  Ht 5\' 4"  (1.626 m)  Wt 225 lb (102.059 kg)  BMI  38.6 kg/m2 GENERAL: 47 year old female; NAD HEENT: NCAT, PERRLA, EOMI; sclera clear; no xanthelasma NECK: palpable bilateral carotid pulses, no bruits; no JVD; no TM LUNGS: CTA bilaterally CARDIAC: RRR (S1, S2); no significant murmurs; no rubs or gallops ABDOMEN: soft, non-tender; intact BS EXTREMETIES: no significant peripheral edema SKIN: warm/dry; no obvious rash/lesions MUSCULOSKELETAL: no joint deformity  NEURO: no focal deficit; NL affect   EKG:    ASSESSMENT & PLAN:  CORONARY ARTERY DISEASE We had an extensive discussion with regarding the possible options for further evaluating her symptoms. I posed the option of a 2-D stress echocardiogram versus cardiac catheterization. She had a normal GXT Cardiolite, 03/2011. She has not had a cardiac catheterization, since undergoing CABG in 2010. She opted for the stress test, followed by an early return visit with me for review of test results. If there is any suggestion of ischemia, then she has agreed to proceed with a cardiac catheterization. If the stress test is negative, although presents a low risk study, then we'll continue to try to optimize her medication regimen. Regarding this, I would elect to add amlodipine for its anti-anginal benefit, given the cited difficulties with Imdur.  DYSLIPIDEMIA Results of her recent lipid profile were reviewed with the patient: LDL 34, triglycerides 300. When last seen, her placed her back on Crestor at 10 mg daily, 1/2 her previous dose. She is better able to tolerate this, but only by taking it every other day. Therefore, I recommended decreasing Crestor to 5 mg daily. Additionally, I will place her on 2 g omega-3 fish oil twice a day, for treatment of her hypertriglyceridemia. I also advised her to decrease her total carbohydrate intake.    Gene Kishana Battey, PAC

## 2012-07-03 NOTE — Assessment & Plan Note (Signed)
We had an extensive discussion with regarding the possible options for further evaluating her symptoms. I closed the option of a 2-D stress echocardiogram versus cardiac catheterization. She had a normal GXT Cardiolite, 03/2011. She has not had a cardiac catheterization, since undergoing CABG in 2010. She opted for the stress test, followed by an early return visit with me for review of test results. If there is any suggestion of ischemia, then she has agreed to proceed with a cardiac catheterization. If the stress test is negative, although presents a low risk study, then we'll continue to try to optimize her medication regimen. Regarding this, I would elect to add amlodipine for its anti-anginal benefit, given the cited difficulties with Imdur.

## 2012-07-03 NOTE — Patient Instructions (Signed)
   Decrease Crestor to 5mg  every evening - new sent to pharm  Begin Omega III Fish Oil - 2 gram (2000mg ) twice a day - OTC  Dobutamine Stress Echo  Office will notify of results Continue all other current medications. Follow up after test above

## 2012-07-08 ENCOUNTER — Other Ambulatory Visit: Payer: Self-pay | Admitting: *Deleted

## 2012-07-08 DIAGNOSIS — I2510 Atherosclerotic heart disease of native coronary artery without angina pectoris: Secondary | ICD-10-CM

## 2012-07-09 DIAGNOSIS — R079 Chest pain, unspecified: Secondary | ICD-10-CM

## 2012-07-10 ENCOUNTER — Telehealth: Payer: Self-pay | Admitting: *Deleted

## 2012-07-10 NOTE — Telephone Encounter (Signed)
Message copied by Lesle Chris on Wed Jul 10, 2012  2:41 PM ------      Message from: Prescott Parma C      Created: Wed Jul 10, 2012  1:20 PM       Neg Dob stress echo. Will review at f/u OV ------

## 2012-07-10 NOTE — Telephone Encounter (Signed)
Notes Recorded by Lesle Chris, LPN on 1/61/0960 at 2:41 PM Patient notified. Follow up OV scheduled for 3/7 with Gene.

## 2012-07-15 ENCOUNTER — Other Ambulatory Visit (INDEPENDENT_AMBULATORY_CARE_PROVIDER_SITE_OTHER): Payer: Medicare Other | Admitting: Ophthalmology

## 2012-07-15 ENCOUNTER — Other Ambulatory Visit: Payer: Self-pay | Admitting: Gastroenterology

## 2012-07-15 DIAGNOSIS — E1139 Type 2 diabetes mellitus with other diabetic ophthalmic complication: Secondary | ICD-10-CM

## 2012-07-15 DIAGNOSIS — E11359 Type 2 diabetes mellitus with proliferative diabetic retinopathy without macular edema: Secondary | ICD-10-CM

## 2012-07-18 ENCOUNTER — Telehealth: Payer: Self-pay | Admitting: Gastroenterology

## 2012-07-19 NOTE — Telephone Encounter (Signed)
Medication was already sent. Patient picked up yesterday. Spoke with the pharmacy

## 2012-07-26 ENCOUNTER — Ambulatory Visit: Payer: Medicare Other | Admitting: Physician Assistant

## 2012-08-07 ENCOUNTER — Encounter: Payer: Self-pay | Admitting: Physician Assistant

## 2012-08-07 ENCOUNTER — Ambulatory Visit (INDEPENDENT_AMBULATORY_CARE_PROVIDER_SITE_OTHER): Payer: Medicare Other | Admitting: Physician Assistant

## 2012-08-07 VITALS — BP 116/72 | HR 65 | Ht 64.0 in | Wt 223.0 lb

## 2012-08-07 DIAGNOSIS — E785 Hyperlipidemia, unspecified: Secondary | ICD-10-CM

## 2012-08-07 DIAGNOSIS — I2510 Atherosclerotic heart disease of native coronary artery without angina pectoris: Secondary | ICD-10-CM

## 2012-08-07 MED ORDER — AMLODIPINE BESYLATE 5 MG PO TABS: 5.0000 mg | ORAL_TABLET | Freq: Every day | ORAL | Status: DC

## 2012-08-07 NOTE — Assessment & Plan Note (Signed)
We reviewed the results of the recent negative stress test.  However, I remain concerned that her symptoms are suggestive of classic angina. We again discussed the option of a cardiac catheterization versus optimizing medical therapy. She again prefers a noninvasive approach, at least for now. She states that although these symptoms are reminiscent of how she felt before undergoing CABG, they are not nearly as intense. Therefore, we will add Norvasc 5 mg daily to her regimen, and discontinue atenolol. She has normal LVF and no history of MI. However, I am concerned that the combination of Norvasc and atenolol would further lower her blood pressure, which remains in the low normal range. Moreover, I want to introduce a different vasodilator, given that she has not been able to tolerate increased doses of Imdur in the past. We have previously documented that she has difficulty swallowing this particular pill and crushing it, as she has to do with her other pills, is not recommended. Therefore we will continue Imdur at the current dose, and reassess her clinical status in 2 weeks.

## 2012-08-07 NOTE — Progress Notes (Signed)
Primary Cardiologist: Jerral Bonito, MD   HPI: Scheduled one-month followup.  When recently seen on February 12, I ordered a dobutamine stress echocardiogram for further evaluation of chest pain. After discussing the options, patient decided to proceed with initial noninvasive evaluation. Her previous stress test was a normal GXT Cardiolite, 03/2011. She has not had a cardiac catheterization, since undergoing CABG in 2010.   - Dobutamine stress echocardiogram, February 18: Negative for ischemia  Since her recent visit, she continues to experience the same degree of chest "burining" with moderate exertion, but not with routine activity or walking at a moderate pace. She denies any exacerbation. She has not had to take NTG. Again, she denies any symptoms at rest.  Allergies  Allergen Reactions  . Metformin Anaphylaxis  . Penicillins Anaphylaxis    Current Outpatient Prescriptions  Medication Sig Dispense Refill  . albuterol (PROVENTIL) (2.5 MG/3ML) 0.083% nebulizer solution Take 2.5 mg by nebulization every 4 (four) hours as needed. For wheezing.      Marland Kitchen ALPRAZolam (XANAX) 0.5 MG tablet TAKE 1 TABLET TWICE DAILY AS NEEDED  60 tablet  3  . AMBULATORY NON FORMULARY MEDICATION Take 10 mg by mouth 4 (four) times daily. Medication Name: DOMPERIDONE 10MG  1 BY MOUTH 3 TIMES A DAY 30 MINUTES BEFORE MEALS AND AT BEDTIME  120 tablet  3  . aspirin 81 MG chewable tablet Chew 81 mg by mouth daily.      Marland Kitchen atenolol (TENORMIN) 25 MG tablet TAKE 1 TABLET BY MOUTH ONCE A DAY  90 tablet  4  . calcium carbonate (TUMS - DOSED IN MG ELEMENTAL CALCIUM) 500 MG chewable tablet Chew 3 tablets by mouth daily.        . fish oil-omega-3 fatty acids 1000 MG capsule Take 2 capsules (2 g total) by mouth 2 (two) times daily.      . Fluticasone-Salmeterol (ADVAIR DISKUS) 100-50 MCG/DOSE AEPB Inhale 1 puff into the lungs as needed.       . hyoscyamine (LEVSIN SL) 0.125 MG SL tablet Place 2 tablets (0.25 mg total) under the tongue  every 4 (four) hours as needed.  30 tablet  6  . insulin aspart (NOVOLOG) 100 UNIT/ML injection Inject 43 Units into the skin daily. Insulin pump      . isosorbide mononitrate (IMDUR) 60 MG 24 hr tablet Take 60 mg by mouth daily.       Marland Kitchen levothyroxine (SYNTHROID, LEVOTHROID) 88 MCG tablet Take 88 mcg by mouth daily.      . nitroGLYCERIN (NITROSTAT) 0.4 MG SL tablet Place 1 tablet (0.4 mg total) under the tongue every 5 (five) minutes as needed for chest pain.  25 tablet  3  . rosuvastatin (CRESTOR) 5 MG tablet Take 1 tablet (5 mg total) by mouth every evening.  30 tablet  6   No current facility-administered medications for this visit.    Past Medical History  Diagnosis Date  . Anxiety   . Asthma   . Diabetes mellitus     Scheduled to go on insulin pump  . History of heart attack   . GERD (gastroesophageal reflux disease)   . Esophageal stricture   . CAD (coronary artery disease)     4v CABG, 9/10; NL LVF, status post followup Cardiolite November 2012 no ischemia ejection fraction 65%  . Dyslipidemia      LDL 22 mg percent on Crestor  . Hypothyroidism   . Hypertension     Past Surgical History  Procedure Laterality Date  .  Angioplasty      with stenting   . Coronary artery bypass graft  01/26/2009    History   Social History  . Marital Status: Single    Spouse Name: N/A    Number of Children: 1  . Years of Education: N/A   Occupational History  . disabilied    Social History Main Topics  . Smoking status: Former Smoker -- 1.00 packs/day for 3 years    Types: Cigarettes    Quit date: 05/22/1993  . Smokeless tobacco: Never Used     Comment:  Year Quit: 1995  . Alcohol Use: No  . Drug Use: No  . Sexually Active: Not on file   Other Topics Concern  . Not on file   Social History Narrative  . No narrative on file    Family History  Problem Relation Age of Onset  . Colon cancer Neg Hx   . Heart disease Father   . Diabetes Mother     ROS: no nausea,  vomiting; no fever, chills; no melena, hematochezia; no claudication  PHYSICAL EXAM: BP 116/72  Pulse 65  Ht 5\' 4"  (1.626 m)  Wt 223 lb (101.152 kg)  BMI 38.26 kg/m2 GENERAL: 47 year old female; NAD  HEENT: NCAT, PERRLA, EOMI; sclera clear; no xanthelasma  NECK: palpable bilateral carotid pulses, no bruits; no JVD; no TM  LUNGS: CTA bilaterally  CARDIAC: RRR (S1, S2); no significant murmurs; no rubs or gallops  ABDOMEN: soft, non-tender; intact BS  EXTREMETIES: no significant peripheral edema  SKIN: warm/dry; no obvious rash/lesions  MUSCULOSKELETAL: no joint deformity  NEURO: no focal deficit; NL affect    EKG:    ASSESSMENT & PLAN:  CORONARY ARTERY DISEASE We reviewed the results of the recent negative stress test.  However, I remain concerned that her symptoms are suggestive of classic angina. We again discussed the option of a cardiac catheterization versus optimizing medical therapy. She again prefers a noninvasive approach, at least for now. She states that although these symptoms are reminiscent of how she felt before undergoing CABG, they are not nearly as intense. Therefore, we will add Norvasc 5 mg daily to her regimen, and discontinue atenolol. She has normal LVF and no history of MI. However, I am concerned that the combination of Norvasc and atenolol would further lower her blood pressure, which remains in the low normal range. Moreover, I want to introduce a different vasodilator, given that she has not been able to tolerate increased doses of Imdur in the past. We have previously documented that she has difficulty swallowing this particular pill and crushing it, as she has to do with her other pills, is not recommended. Therefore we will continue Imdur at the current dose, and reassess her clinical status in 2 weeks.  DYSLIPIDEMIA Will reassess lipid/triglyceride status with repeat labs. When last seen, I increased her omega-3 fish oil dose to 2 g twice a day, following a  triglyceride level of 300. We also decreased Crestor to 5 mg daily, so as to maintain a low dose of a statin, which she has not been able to tolerate in increased doses in the past.     Gene Shenica Holzheimer, PAC

## 2012-08-07 NOTE — Patient Instructions (Addendum)
   Labs for FLP, LFT   Stop Atenolol  Begin Norvasc 5mg  daily  Office will contact with results Continue all other current medications. Follow up in  2 weeks

## 2012-08-07 NOTE — Assessment & Plan Note (Signed)
Will reassess lipid/triglyceride status with repeat labs. When last seen, I increased her omega-3 fish oil dose to 2 g twice a day, following a triglyceride level of 300. We also decreased Crestor to 5 mg daily, so as to maintain a low dose of a statin, which she has not been able to tolerate in increased doses in the past.

## 2012-08-19 ENCOUNTER — Telehealth: Payer: Self-pay | Admitting: *Deleted

## 2012-08-19 MED ORDER — METOPROLOL SUCCINATE ER 25 MG PO TB24: 25.0000 mg | ORAL_TABLET | Freq: Every day | ORAL | Status: DC

## 2012-08-19 NOTE — Telephone Encounter (Signed)
Continue Norvasc 5 daily, but will also add Toprol XL 25 daily, instead of resuming Atenolol.

## 2012-08-19 NOTE — Telephone Encounter (Signed)
Left message on voice mail - since recent change in BP med - Atenolol to Norvasc, heart rate has been very high.  Running 123 to 127.  Usually heart rate is in the 60-70's.

## 2012-08-19 NOTE — Telephone Encounter (Signed)
Patient notified.  New rx sent to [facility].  Already has follow up OV scheduled for 4/3 with Gene.

## 2012-08-21 ENCOUNTER — Telehealth: Payer: Self-pay | Admitting: *Deleted

## 2012-08-21 NOTE — Telephone Encounter (Signed)
Message copied by Lesle Chris on Wed Aug 21, 2012 10:26 AM ------      Message from: [name]      Created: Mon Aug 19, 2012  1:32 PM       Trigs improved from 300 to 215, but LDL has increased from 34 to 116. Need to review at f/u OV ------

## 2012-08-21 NOTE — Telephone Encounter (Signed)
Notes Recorded by Lesle Chris, LPN on 05/27/1094 at 10:26 AM Patient notified and verbalized understanding. Already has follow up OV scheduled for 4/3 with Gene Serpe, PA.

## 2012-08-22 ENCOUNTER — Ambulatory Visit (INDEPENDENT_AMBULATORY_CARE_PROVIDER_SITE_OTHER): Payer: Medicare Other | Admitting: Physician Assistant

## 2012-08-22 ENCOUNTER — Telehealth: Payer: Self-pay | Admitting: Physician Assistant

## 2012-08-22 ENCOUNTER — Other Ambulatory Visit: Payer: Self-pay | Admitting: Physician Assistant

## 2012-08-22 ENCOUNTER — Encounter: Payer: Self-pay | Admitting: Physician Assistant

## 2012-08-22 ENCOUNTER — Encounter: Payer: Self-pay | Admitting: *Deleted

## 2012-08-22 VITALS — BP 114/72 | HR 72 | Ht 64.0 in | Wt 222.8 lb

## 2012-08-22 DIAGNOSIS — R0602 Shortness of breath: Secondary | ICD-10-CM

## 2012-08-22 DIAGNOSIS — E782 Mixed hyperlipidemia: Secondary | ICD-10-CM

## 2012-08-22 DIAGNOSIS — Z01810 Encounter for preprocedural cardiovascular examination: Secondary | ICD-10-CM

## 2012-08-22 DIAGNOSIS — Z79899 Other long term (current) drug therapy: Secondary | ICD-10-CM

## 2012-08-22 DIAGNOSIS — E785 Hyperlipidemia, unspecified: Secondary | ICD-10-CM

## 2012-08-22 DIAGNOSIS — I10 Essential (primary) hypertension: Secondary | ICD-10-CM

## 2012-08-22 DIAGNOSIS — R079 Chest pain, unspecified: Secondary | ICD-10-CM

## 2012-08-22 MED ORDER — ROSUVASTATIN CALCIUM 10 MG PO TABS: 10.0000 mg | ORAL_TABLET | Freq: Every evening | ORAL | Status: DC

## 2012-08-22 MED ORDER — METOPROLOL TARTRATE 25 MG PO TABS: 12.5000 mg | ORAL_TABLET | Freq: Two times a day (BID) | ORAL | Status: DC

## 2012-08-22 NOTE — Assessment & Plan Note (Signed)
Well-controlled on current medication regimen 

## 2012-08-22 NOTE — Assessment & Plan Note (Addendum)
Recent FLP notable for marked increase in LDL from 34 to116. Triglycerides improved from 300 down to 215. HDL slightly improved from 42 to 45. This was after I increased omega-3 fish oil to 2 g twice a day, but decreased Crestor to 5 mg daily for plaque stabilization. She has not been able to tolerate increased doses of statins in the past. Following review of these results with her, she has agreed to go back to Crestor 10 mg daily. She will need followup FLP in 12 weeks.

## 2012-08-22 NOTE — Telephone Encounter (Signed)
No precert required 

## 2012-08-22 NOTE — Progress Notes (Signed)
Primary Cardiologist: Jerral Bonito, MD   HPI: Scheduled early followup, last seen March 19. At that time, we discussed the results of a dobutamine stress echocardiogram which was negative for ischemia. However, I remained concerned that her symptoms were suggestive of classic angina. We again discussed the option of a cardiac cath versus optimizing medical therapy, and she opted for the latter approach, for now. I added Norvasc 5 mg daily and discontinued atenolol, given her known NL LVF and no prior history of MI. I was concerned that the combination with result in hypotension.  She reports today, however, that she began experiencing elevated HR approximately one hour after taking Norvasc, with readings as high as 127 bpm. She stopped taking Norvasc after approximately one week, and went back on her previous dose of atenolol these last 2 days. Since then, her HR has returned to her baseline of 60-70 bpm range.  Clinically, she continues to experience significant chest "burning" with moderate activity or exertion. She recently walked up a steep incline, and had a particularly severe episode. Her symptoms resolved spontaneously with rest, after about 5 minutes. She did not have to take any NTG. She continues to report no rest angina.  Allergies  Allergen Reactions  . Metformin Anaphylaxis  . Penicillins Anaphylaxis    Current Outpatient Prescriptions  Medication Sig Dispense Refill  . albuterol (PROVENTIL) (2.5 MG/3ML) 0.083% nebulizer solution Take 2.5 mg by nebulization every 4 (four) hours as needed. For wheezing.      Marland Kitchen ALPRAZolam (XANAX) 0.5 MG tablet TAKE 1 TABLET TWICE DAILY AS NEEDED  60 tablet  3  . aspirin 81 MG chewable tablet Chew 81 mg by mouth daily.      . calcium carbonate (TUMS - DOSED IN MG ELEMENTAL CALCIUM) 500 MG chewable tablet Chew 3 tablets by mouth daily.        . fish oil-omega-3 fatty acids 1000 MG capsule Take 2 capsules (2 g total) by mouth 2 (two) times daily.      .  Fluticasone-Salmeterol (ADVAIR DISKUS) 100-50 MCG/DOSE AEPB Inhale 1 puff into the lungs as needed.       . hyoscyamine (LEVSIN SL) 0.125 MG SL tablet Place 2 tablets (0.25 mg total) under the tongue every 4 (four) hours as needed.  30 tablet  6  . insulin aspart (NOVOLOG) 100 UNIT/ML injection Inject 43 Units into the skin daily. Insulin pump      . levothyroxine (SYNTHROID, LEVOTHROID) 88 MCG tablet Take 88 mcg by mouth daily.      . nitroGLYCERIN (NITROSTAT) 0.4 MG SL tablet Place 1 tablet (0.4 mg total) under the tongue every 5 (five) minutes as needed for chest pain.  25 tablet  3  . rosuvastatin (CRESTOR) 10 MG tablet Take 1 tablet (10 mg total) by mouth every evening.      . AMBULATORY NON FORMULARY MEDICATION Take 10 mg by mouth 4 (four) times daily. Medication Name: DOMPERIDONE 10MG  1 BY MOUTH 3 TIMES A DAY 30 MINUTES BEFORE MEALS AND AT BEDTIME  120 tablet  3  . metoprolol tartrate (LOPRESSOR) 25 MG tablet Take 0.5 tablets (12.5 mg total) by mouth 2 (two) times daily.  15 tablet  6   No current facility-administered medications for this visit.    Past Medical History  Diagnosis Date  . Anxiety   . Asthma   . Diabetes mellitus     Scheduled to go on insulin pump  . History of heart attack   . GERD (gastroesophageal  reflux disease)   . Esophageal stricture   . CAD (coronary artery disease)     4v CABG, 9/10; NL LVF, status post followup Cardiolite November 2012 no ischemia ejection fraction 65%  . Dyslipidemia      LDL 22 mg percent on Crestor  . Hypothyroidism   . Hypertension     Past Surgical History  Procedure Laterality Date  . Angioplasty      with stenting   . Coronary artery bypass graft  01/26/2009    History   Social History  . Marital Status: Single    Spouse Name: N/A    Number of Children: 1  . Years of Education: N/A   Occupational History  . disabilied    Social History Main Topics  . Smoking status: Former Smoker -- 1.00 packs/day for 3 years     Types: Cigarettes    Quit date: 05/22/1993  . Smokeless tobacco: Never Used     Comment:  Year Quit: 1995  . Alcohol Use: No  . Drug Use: No  . Sexually Active: Not on file   Other Topics Concern  . Not on file   Social History Narrative  . No narrative on file    Family History  Problem Relation Age of Onset  . Colon cancer Neg Hx   . Heart disease Father   . Diabetes Mother     ROS: no nausea, vomiting; no fever, chills; no melena, hematochezia; no claudication  PHYSICAL EXAM: BP 114/72  Pulse 72  Ht 5\' 4"  (1.626 m)  Wt 222 lb 12.8 oz (101.061 kg)  BMI 38.22 kg/m2  SpO2 98% GENERAL: 47 year old female; NAD  HEENT: NCAT, PERRLA, EOMI; sclera clear; no xanthelasma  NECK: palpable bilateral carotid pulses, no bruits; no JVD; no TM  LUNGS: CTA bilaterally  CARDIAC: RRR (S1, S2); no significant murmurs; no rubs or gallops  ABDOMEN: soft, non-tender; intact BS  EXTREMETIES: no significant peripheral edema  SKIN: warm/dry; no obvious rash/lesions  MUSCULOSKELETAL: no joint deformity  NEURO: no focal deficit; NL affect    EKG:    ASSESSMENT & PLAN:  CORONARY ARTERY DISEASE In light of her recurrent symptoms of exertional angina, reminiscent of how she felt before undergoing PCI or CABG, and given the difficulty in optimizing her medication regimen in recent past, specifically with her intolerance of Norvasc secondary to tachycardia, we have agreed to proceed with diagnostic coronary angiography. This was also reviewed with Dr. Diona Browner, who has approved this plan. The risks/benefits of the procedure were reviewed with the patient, who has agreed to proceed. Regarding medical therapy, I instructed her to finish her remaining atenolol tablets, and then start Lopressor 12.5 twice a day. Of note, I had recently suggested that she start Toprol-XL 25 daily, instead of resuming atenolol for treatment of her tachycardia. She informed me today, however, that these tablets were too  large for her to swallow, and she did not fill the prescription. Also, she would not be able to crush this medication, given that this is a long-lasting medication. I also recommended that she stop taking Norvasc, given that she developed associated tachycardia.  DYSLIPIDEMIA Recent FLP notable for marked increase in LDL from 34 to116. Triglycerides improved from 300 down to 215. HDL slightly improved from 42 to 45. This was after I increased omega-3 fish oil to 2 g twice a day, but decreased Crestor to 5 mg daily for plaque stabilization. She has not been able to tolerate increased doses of statins in  the past. Following review of these results with her, she has agreed to go back to Crestor 10 mg daily. She will need followup FLP in 12 weeks.  HTN (hypertension) Well-controlled on current medication regimen.    Gene Manasvi Dickard, PAC

## 2012-08-22 NOTE — Telephone Encounter (Signed)
Left JV heart cath - Monday, 4/7 - 8:30 - [place]

## 2012-08-22 NOTE — Patient Instructions (Signed)
   Stop Norvasc  Stop Toprol XL  Left JV heart cath - see info sheet given  Increase Crestor to 10mg  daily - already have 10mg  tabs at home  Labs in 12 weeks for fasting lipid and liver panel - around November 21, 2012 - orders given today  Finish Atenolol, then begin Lopressor 25mg  1/2 tab (12.5mg ) twice a day  - new sent to pharm Continue all other current medications.   Follow up will be given after cath

## 2012-08-22 NOTE — Assessment & Plan Note (Signed)
In light of her recurrent symptoms of exertional angina, reminiscent of how she felt before undergoing PCI or CABG, and given the difficulty in optimizing her medication regimen in recent past, specifically with her intolerance of Norvasc secondary to tachycardia, we have agreed to proceed with diagnostic coronary angiography. This was also reviewed with Dr. Diona Browner, who has approved this plan. The risks/benefits of the procedure were reviewed with the patient, who has agreed to proceed. Regarding medical therapy, I instructed her to finish her remaining atenolol tablets, and then start Lopressor 12.5 twice a day. Of note, I had recently suggested that she start Toprol-XL 25 daily, instead of resuming atenolol for treatment of her tachycardia. She informed me today, however, that these tablets were too large for her to swallow, and she did not fill the prescription. Also, she would not be able to crush this medication, given that this is a long-lasting medication. I also recommended that she stop taking Norvasc, given that she developed associated tachycardia.

## 2012-08-26 ENCOUNTER — Inpatient Hospital Stay (HOSPITAL_BASED_OUTPATIENT_CLINIC_OR_DEPARTMENT_OTHER)
Admission: RE | Admit: 2012-08-26 | Discharge: 2012-08-26 | Disposition: A | Discharge status: Home / Self Care | Payer: Medicare Other | Source: Ambulatory Visit | Attending: Cardiology | Admitting: Cardiology

## 2012-08-26 ENCOUNTER — Encounter (HOSPITAL_BASED_OUTPATIENT_CLINIC_OR_DEPARTMENT_OTHER)
Admission: RE | Disposition: A | Discharge status: Home / Self Care | Payer: Self-pay | Source: Ambulatory Visit | Attending: Cardiology

## 2012-08-26 DIAGNOSIS — I209 Angina pectoris, unspecified: Secondary | ICD-10-CM | POA: Insufficient documentation

## 2012-08-26 DIAGNOSIS — E785 Hyperlipidemia, unspecified: Secondary | ICD-10-CM | POA: Insufficient documentation

## 2012-08-26 DIAGNOSIS — Z951 Presence of aortocoronary bypass graft: Secondary | ICD-10-CM | POA: Insufficient documentation

## 2012-08-26 DIAGNOSIS — R079 Chest pain, unspecified: Secondary | ICD-10-CM

## 2012-08-26 DIAGNOSIS — Z9641 Presence of insulin pump (external) (internal): Secondary | ICD-10-CM | POA: Insufficient documentation

## 2012-08-26 DIAGNOSIS — Z794 Long term (current) use of insulin: Secondary | ICD-10-CM | POA: Insufficient documentation

## 2012-08-26 DIAGNOSIS — I2510 Atherosclerotic heart disease of native coronary artery without angina pectoris: Secondary | ICD-10-CM

## 2012-08-26 DIAGNOSIS — E119 Type 2 diabetes mellitus without complications: Secondary | ICD-10-CM | POA: Insufficient documentation

## 2012-08-26 DIAGNOSIS — I10 Essential (primary) hypertension: Secondary | ICD-10-CM | POA: Insufficient documentation

## 2012-08-26 SURGERY — JV LEFT HEART CATHETERIZATION WITH CORONARY/GRAFT ANGIOGRAM
Anesthesia: Moderate Sedation

## 2012-08-26 MED ORDER — ASPIRIN 81 MG PO CHEW
324.0000 mg | CHEWABLE_TABLET | ORAL | Status: AC
Start: 2012-08-27 — End: 2012-08-26
  Administered 2012-08-26: 243 mg via ORAL

## 2012-08-26 MED ORDER — ONDANSETRON HCL 4 MG/2ML IJ SOLN: 4.0000 mg | Freq: Four times a day (QID) | INTRAMUSCULAR | Status: DC | PRN

## 2012-08-26 MED ORDER — SODIUM CHLORIDE 0.9 % IV SOLN: 1.0000 mL/kg/h | INTRAVENOUS | Status: DC

## 2012-08-26 MED ORDER — SODIUM CHLORIDE 0.9 % IJ SOLN: 3.0000 mL | Freq: Two times a day (BID) | INTRAMUSCULAR | Status: DC

## 2012-08-26 MED ORDER — ACETAMINOPHEN 325 MG PO TABS: 650.0000 mg | ORAL_TABLET | ORAL | Status: DC | PRN

## 2012-08-26 MED ORDER — SODIUM CHLORIDE 0.9 % IV SOLN: INTRAVENOUS | Status: DC

## 2012-08-26 MED ORDER — SODIUM CHLORIDE 0.9 % IV SOLN: 250.0000 mL | INTRAVENOUS | Status: DC | PRN

## 2012-08-26 MED ORDER — SODIUM CHLORIDE 0.9 % IJ SOLN: 3.0000 mL | INTRAMUSCULAR | Status: DC | PRN

## 2012-08-26 NOTE — OR Nursing (Signed)
Tegaderm dressing applied, site level 0, bedrest begins at 0930 

## 2012-08-26 NOTE — Interval H&P Note (Signed)
History and Physical Interval Note:  08/26/2012 8:43 AM  [name]  has presented today for surgery, with the diagnosis of chest pain  The various methods of treatment have been discussed with the patient and family. After consideration of risks, benefits and other options for treatment, the patient has consented to  Procedure(s): JV LEFT HEART CATHETERIZATION WITH CORONARY/GRAFT ANGIOGRAM (N/A) as a surgical intervention .  The patient's history has been reviewed, patient examined, no change in status, stable for surgery.  I have reviewed the patient's chart and labs.  Questions were answered to the patient's satisfaction.     Theron Arista [facility] 08/26/2012 8:44 AM

## 2012-08-26 NOTE — OR Nursing (Signed)
+  Allen's test right hand 

## 2012-08-26 NOTE — OR Nursing (Signed)
Dr Jordan at bedside to discuss results and treatment plan with pt and family 

## 2012-08-26 NOTE — H&P (View-Only) (Signed)
Primary Cardiologist: Jeff Katz, MD   HPI: Scheduled early followup, last seen March 19. At that time, we discussed the results of a dobutamine stress echocardiogram which was negative for ischemia. However, I remained concerned that her symptoms were suggestive of classic angina. We again discussed the option of a cardiac cath versus optimizing medical therapy, and she opted for the latter approach, for now. I added Norvasc 5 mg daily and discontinued atenolol, given her known NL LVF and no prior history of MI. I was concerned that the combination with result in hypotension.  She reports today, however, that she began experiencing elevated HR approximately one hour after taking Norvasc, with readings as high as 127 bpm. She stopped taking Norvasc after approximately one week, and went back on her previous dose of atenolol these last 2 days. Since then, her HR has returned to her baseline of 60-70 bpm range.  Clinically, she continues to experience significant chest "burning" with moderate activity or exertion. She recently walked up a steep incline, and had a particularly severe episode. Her symptoms resolved spontaneously with rest, after about 5 minutes. She did not have to take any NTG. She continues to report no rest angina.  Allergies  Allergen Reactions  . Metformin Anaphylaxis  . Penicillins Anaphylaxis    Current Outpatient Prescriptions  Medication Sig Dispense Refill  . albuterol (PROVENTIL) (2.5 MG/3ML) 0.083% nebulizer solution Take 2.5 mg by nebulization every 4 (four) hours as needed. For wheezing.      . ALPRAZolam (XANAX) 0.5 MG tablet TAKE 1 TABLET TWICE DAILY AS NEEDED  60 tablet  3  . aspirin 81 MG chewable tablet Chew 81 mg by mouth daily.      . calcium carbonate (TUMS - DOSED IN MG ELEMENTAL CALCIUM) 500 MG chewable tablet Chew 3 tablets by mouth daily.        . fish oil-omega-3 fatty acids 1000 MG capsule Take 2 capsules (2 g total) by mouth 2 (two) times daily.      .  Fluticasone-Salmeterol (ADVAIR DISKUS) 100-50 MCG/DOSE AEPB Inhale 1 puff into the lungs as needed.       . hyoscyamine (LEVSIN SL) 0.125 MG SL tablet Place 2 tablets (0.25 mg total) under the tongue every 4 (four) hours as needed.  30 tablet  6  . insulin aspart (NOVOLOG) 100 UNIT/ML injection Inject 43 Units into the skin daily. Insulin pump      . levothyroxine (SYNTHROID, LEVOTHROID) 88 MCG tablet Take 88 mcg by mouth daily.      . nitroGLYCERIN (NITROSTAT) 0.4 MG SL tablet Place 1 tablet (0.4 mg total) under the tongue every 5 (five) minutes as needed for chest pain.  25 tablet  3  . rosuvastatin (CRESTOR) 10 MG tablet Take 1 tablet (10 mg total) by mouth every evening.      . AMBULATORY NON FORMULARY MEDICATION Take 10 mg by mouth 4 (four) times daily. Medication Name: DOMPERIDONE 10MG 1 BY MOUTH 3 TIMES A DAY 30 MINUTES BEFORE MEALS AND AT BEDTIME  120 tablet  3  . metoprolol tartrate (LOPRESSOR) 25 MG tablet Take 0.5 tablets (12.5 mg total) by mouth 2 (two) times daily.  15 tablet  6   No current facility-administered medications for this visit.    Past Medical History  Diagnosis Date  . Anxiety   . Asthma   . Diabetes mellitus     Scheduled to go on insulin pump  . History of heart attack   . GERD (gastroesophageal   reflux disease)   . Esophageal stricture   . CAD (coronary artery disease)     4v CABG, 9/10; NL LVF, status post followup Cardiolite November 2012 no ischemia ejection fraction 65%  . Dyslipidemia      LDL 22 mg percent on Crestor  . Hypothyroidism   . Hypertension     Past Surgical History  Procedure Laterality Date  . Angioplasty      with stenting   . Coronary artery bypass graft  01/26/2009    History   Social History  . Marital Status: Single    Spouse Name: N/A    Number of Children: 1  . Years of Education: N/A   Occupational History  . disabilied    Social History Main Topics  . Smoking status: Former Smoker -- 1.00 packs/day for 3 years     Types: Cigarettes    Quit date: 05/22/1993  . Smokeless tobacco: Never Used     Comment:  Year Quit: 1995  . Alcohol Use: No  . Drug Use: No  . Sexually Active: Not on file   Other Topics Concern  . Not on file   Social History Narrative  . No narrative on file    Family History  Problem Relation Age of Onset  . Colon cancer Neg Hx   . Heart disease Father   . Diabetes Mother     ROS: no nausea, vomiting; no fever, chills; no melena, hematochezia; no claudication  PHYSICAL EXAM: BP 114/72  Pulse 72  Ht 5' 4" (1.626 m)  Wt 222 lb 12.8 oz (101.061 kg)  BMI 38.22 kg/m2  SpO2 98% GENERAL: 47-year-old female; NAD  HEENT: NCAT, PERRLA, EOMI; sclera clear; no xanthelasma  NECK: palpable bilateral carotid pulses, no bruits; no JVD; no TM  LUNGS: CTA bilaterally  CARDIAC: RRR (S1, S2); no significant murmurs; no rubs or gallops  ABDOMEN: soft, non-tender; intact BS  EXTREMETIES: no significant peripheral edema  SKIN: warm/dry; no obvious rash/lesions  MUSCULOSKELETAL: no joint deformity  NEURO: no focal deficit; NL affect    EKG:    ASSESSMENT & PLAN:  CORONARY ARTERY DISEASE In light of her recurrent symptoms of exertional angina, reminiscent of how she felt before undergoing PCI or CABG, and given the difficulty in optimizing her medication regimen in recent past, specifically with her intolerance of Norvasc secondary to tachycardia, we have agreed to proceed with diagnostic coronary angiography. This was also reviewed with Dr. McDowell, who has approved this plan. The risks/benefits of the procedure were reviewed with the patient, who has agreed to proceed. Regarding medical therapy, I instructed her to finish her remaining atenolol tablets, and then start Lopressor 12.5 twice a day. Of note, I had recently suggested that she start Toprol-XL 25 daily, instead of resuming atenolol for treatment of her tachycardia. She informed me today, however, that these tablets were too  large for her to swallow, and she did not fill the prescription. Also, she would not be able to crush this medication, given that this is a long-lasting medication. I also recommended that she stop taking Norvasc, given that she developed associated tachycardia.  DYSLIPIDEMIA Recent FLP notable for marked increase in LDL from 34 to116. Triglycerides improved from 300 down to 215. HDL slightly improved from 42 to 45. This was after I increased omega-3 fish oil to 2 g twice a day, but decreased Crestor to 5 mg daily for plaque stabilization. She has not been able to tolerate increased doses of statins in   the past. Following review of these results with her, she has agreed to go back to Crestor 10 mg daily. She will need followup FLP in 12 weeks.  HTN (hypertension) Well-controlled on current medication regimen.    Gene Zamarion Longest, PAC  

## 2012-08-26 NOTE — CV Procedure (Signed)
   Cardiac Catheterization Procedure Note  Name: SKARLETT SEDLACEK MRN: [id number] DOB: 1965/11/07  Procedure: Left Heart Cath, Selective Coronary Angiography, saphenous vein graft angiography, LIMA graft angiography, LV angiography  Indication: 47 year old white female with history of coronary disease status post CABG in September 2010. He presents with symptoms of exertional chest pain. Prior dobutamine stress echo was unremarkable but she has had continued chest pain despite medical treatment.   Procedural details: The right groin was prepped, draped, and anesthetized with 1% lidocaine. Using modified Seldinger technique, a 4 French sheath was introduced into the right femoral artery. Standard Judkins catheters were used for coronary angiography and left ventriculography. Catheter exchanges were performed over a guidewire. There were no immediate procedural complications. The patient was transferred to the post catheterization recovery area for further monitoring.  Procedural Findings: Hemodynamics:  AO 145/78 with a mean of 108 mmHg LV 147/22 mmHg   Coronary angiography: Coronary dominance: right  Left mainstem: Normal.  Left anterior descending (LAD): The left anterior descending artery has a stent in the mid vessel. Prior to this stent there is a 90% focal stenosis. The first diagonal is occluded. The second diagonal is very small in caliber and is normal. The third diagonal takeoff was distal to the stent and fills by both the native vessel and the mammary graft.  Left circumflex (LCx): The left circumflex gives rise to 3 marginal branches. The third marginal branches the largest. The first marginal branch fills via vein graft. There is a 40% stenosis in the proximal left circumflex. The other marginal branches are without significant disease.  Right coronary artery (RCA): The right coronary is a dominant vessel. It has a 60% stenosis in the proximal to mid vessel. There is a 70%  stenosis at the crux. The PDA fills competitively via the vein graft. The posterior lateral branch has a 50-70% stenosis.   The saphenous vein graft to the first diagonal is widely patent.  The saphenous vein graft to the PDA is widely patent.  The saphenous vein graft to the first obtuse marginal vessel is widely patent  The LIMA graft to the LAD is widely patent.  Left ventriculography: Left ventricular systolic function is normal, LVEF is estimated at 55-65%, there is no significant mitral regurgitation   Final Conclusions:   1. Three-vessel obstructive coronary disease. 2. All grafts are patent including LIMA graft to the LAD, saphenous vein graft to the diagonal, saphenous vein graft to the OM, and saphenous vein graft to the PDA. 3. Normal left ventricular function.  Recommendations: Medical management.  Theron Arista [facility] 08/26/2012, 9:25 AM

## 2012-08-26 NOTE — OR Nursing (Signed)
Discharge instructions reviewed and signed, pt stated understanding, ambulated in hall without difficulty, site level 0, transported to friend's car via wheelchair 

## 2012-09-09 ENCOUNTER — Encounter: Payer: Self-pay | Admitting: Physician Assistant

## 2012-09-09 ENCOUNTER — Ambulatory Visit (INDEPENDENT_AMBULATORY_CARE_PROVIDER_SITE_OTHER): Payer: Medicare Other | Admitting: Physician Assistant

## 2012-09-09 VITALS — BP 108/70 | HR 63 | Ht 64.0 in | Wt 223.0 lb

## 2012-09-09 DIAGNOSIS — I2510 Atherosclerotic heart disease of native coronary artery without angina pectoris: Secondary | ICD-10-CM

## 2012-09-09 DIAGNOSIS — I10 Essential (primary) hypertension: Secondary | ICD-10-CM

## 2012-09-09 DIAGNOSIS — E785 Hyperlipidemia, unspecified: Secondary | ICD-10-CM

## 2012-09-09 NOTE — Assessment & Plan Note (Signed)
We reviewed the results of her recent catheterization, notable for all 4 bypass grafts remaining widely patent. This is her first catheterizations since undergoing CABG in 2010. I recommended that we continue with the current medication regimen, also pointing out that I would not be able to up titrate any of her current medications, much less add something new, given her relative hypotension. Moreover, we will await the findings of her GI evaluation in the next several weeks, to see if there may be some correlation with GERD. The patient was quite agreeable with this current plan, and quite relieved by the results of this catheterization.

## 2012-09-09 NOTE — Assessment & Plan Note (Signed)
Tolerating recent increased dose of Crestor. Followup lipid profile had been previously ordered.

## 2012-09-09 NOTE — Patient Instructions (Signed)
Continue all current medications. Your physician wants you to follow up in: 6 months.  You will receive a reminder letter in the mail one-two months in advance.  If you don't receive a letter, please call our office to schedule the follow up appointment   

## 2012-09-09 NOTE — Assessment & Plan Note (Signed)
Very well controlled on current medication regimen

## 2012-09-09 NOTE — Progress Notes (Signed)
Primary Cardiologist: Jerral Bonito, MD   HPI: Post cardiac catheterization followup, scheduled electively at time of recent OV.   - Cardiac catheterization, April 7: 3v CAD; all grafts widely patent: LIMA-LAD, SVG-DX, SVG-OM, and SVG-PDA; EF 55-65%  Although she continues to experience intermittent CP, as previously outlined, there's been no increase in frequency or intensity, since last OV. She and her husband wonder if these symptoms may not be due to GERD, for which she has been treated in the past. She reports having been on Nexium and Pepcid before, but currently takes only TUMS, as needed.  She denies any complications of R groin incision site.  Allergies  Allergen Reactions  . Metformin Anaphylaxis  . Penicillins Anaphylaxis    Current Outpatient Prescriptions  Medication Sig Dispense Refill  . albuterol (PROVENTIL) (2.5 MG/3ML) 0.083% nebulizer solution Take 2.5 mg by nebulization every 4 (four) hours as needed. For wheezing.      Marland Kitchen ALPRAZolam (XANAX) 0.5 MG tablet TAKE 1 TABLET TWICE DAILY AS NEEDED  60 tablet  3  . AMBULATORY NON FORMULARY MEDICATION Take 10 mg by mouth as needed. Medication Name: DOMPERIDONE 10MG  1 BY MOUTH 3 TIMES A DAY 30 MINUTES BEFORE MEALS AND AT BEDTIME      . aspirin 81 MG chewable tablet Chew 81 mg by mouth daily.      Marland Kitchen atenolol (TENORMIN) 25 MG tablet Take 25 mg by mouth daily.       . calcium carbonate (TUMS - DOSED IN MG ELEMENTAL CALCIUM) 500 MG chewable tablet Chew 3 tablets by mouth daily.        Marland Kitchen CARAFATE 1 GM/10ML suspension Take 500 mg by mouth 2 (two) times daily.       . fish oil-omega-3 fatty acids 1000 MG capsule Take 2 capsules (2 g total) by mouth 2 (two) times daily.      . fluticasone (FLONASE) 50 MCG/ACT nasal spray Place 1 spray into the nose as needed.       . Fluticasone-Salmeterol (ADVAIR DISKUS) 100-50 MCG/DOSE AEPB Inhale 1 puff into the lungs as needed.       . hyoscyamine (LEVSIN SL) 0.125 MG SL tablet Place 2 tablets (0.25 mg  total) under the tongue every 4 (four) hours as needed.  30 tablet  6  . insulin aspart (NOVOLOG) 100 UNIT/ML injection Inject 43 Units into the skin daily. Insulin pump      . levothyroxine (SYNTHROID, LEVOTHROID) 88 MCG tablet Take 88 mcg by mouth daily.      . metoprolol tartrate (LOPRESSOR) 25 MG tablet Take 0.5 tablets (12.5 mg total) by mouth 2 (two) times daily.  15 tablet  6  . nitroGLYCERIN (NITROSTAT) 0.4 MG SL tablet Place 1 tablet (0.4 mg total) under the tongue every 5 (five) minutes as needed for chest pain.  25 tablet  3  . rosuvastatin (CRESTOR) 10 MG tablet Take 1 tablet (10 mg total) by mouth every evening.       No current facility-administered medications for this visit.    Past Medical History  Diagnosis Date  . Anxiety   . Asthma   . Diabetes mellitus     Scheduled to go on insulin pump  . History of heart attack   . GERD (gastroesophageal reflux disease)   . Esophageal stricture   . CAD (coronary artery disease)     4v CABG, 9/10; NL LVF, status post followup Cardiolite November 2012 no ischemia ejection fraction 65%  . Dyslipidemia  LDL 22 mg percent on Crestor  . Hypothyroidism   . Hypertension     Past Surgical History  Procedure Laterality Date  . Angioplasty      with stenting   . Coronary artery bypass graft  01/26/2009    History   Social History  . Marital Status: Single    Spouse Name: N/A    Number of Children: 1  . Years of Education: N/A   Occupational History  . disabilied    Social History Main Topics  . Smoking status: Former Smoker -- 1.00 packs/day for 3 years    Types: Cigarettes    Quit date: 05/22/1993  . Smokeless tobacco: Never Used     Comment:  Year Quit: 1995  . Alcohol Use: No  . Drug Use: No  . Sexually Active: Not on file   Other Topics Concern  . Not on file   Social History Narrative  . No narrative on file    Family History  Problem Relation Age of Onset  . Colon cancer Neg Hx   . Heart disease  Father   . Diabetes Mother     ROS: no nausea, vomiting; no fever, chills; no melena, hematochezia; no claudication  PHYSICAL EXAM: BP 108/70  Pulse 63  Ht 5\' 4"  (1.626 m)  Wt 223 lb (101.152 kg)  BMI 38.26 kg/m2  SpO2 94% GENERAL: 47 year old female, obese; NAD  HEENT: NCAT, PERRLA, EOMI; sclera clear; no xanthelasma  NECK: palpable bilateral carotid pulses, no bruits; no JVD; no TM  LUNGS: CTA bilaterally  CARDIAC: RRR (S1, S2); no significant murmurs; no rubs or gallops  ABDOMEN: soft, protuberant  EXTREMETIES: no significant peripheral edema  SKIN: warm/dry; no obvious rash/lesions  MUSCULOSKELETAL: no joint deformity  NEURO: no focal deficit; NL affect t   EKG:    ASSESSMENT & PLAN:  CORONARY ARTERY DISEASE We reviewed the results of her recent catheterization, notable for all 4 bypass grafts remaining widely patent. This is her first catheterizations since undergoing CABG in 2010. I recommended that we continue with the current medication regimen, also pointing out that I would not be able to up titrate any of her current medications, much less add something new, given her relative hypotension. Moreover, we will await the findings of her GI evaluation in the next several weeks, to see if there may be some correlation with GERD. The patient was quite agreeable with this current plan, and quite relieved by the results of this catheterization.  DYSLIPIDEMIA Tolerating recent increased dose of Crestor. Followup lipid profile had been previously ordered.  HTN (hypertension) Very well controlled on current medication regimen    Gene Arneisha Kincannon, PAC

## 2012-09-26 ENCOUNTER — Telehealth: Payer: Self-pay | Admitting: Gastroenterology

## 2012-09-26 ENCOUNTER — Ambulatory Visit (INDEPENDENT_AMBULATORY_CARE_PROVIDER_SITE_OTHER): Payer: Medicare Other | Admitting: Gastroenterology

## 2012-09-26 ENCOUNTER — Encounter: Payer: Self-pay | Admitting: Gastroenterology

## 2012-09-26 VITALS — BP 112/70 | HR 72 | Ht 62.5 in | Wt 222.0 lb

## 2012-09-26 DIAGNOSIS — K219 Gastro-esophageal reflux disease without esophagitis: Secondary | ICD-10-CM

## 2012-09-26 MED ORDER — LANSOPRAZOLE 30 MG PO TBDP: 30.0000 mg | ORAL_TABLET | Freq: Every day | ORAL | Status: DC

## 2012-09-26 NOTE — Patient Instructions (Addendum)
Please stop taking carafate  Please schedule a one month follow up appointment with Dr. Arlyce Dice

## 2012-09-26 NOTE — Progress Notes (Signed)
History of Present Illness:  [name] has returned for evaluation of chest discomfort.  She describes a burning pain that radiates from her chest up to her throat. In addition, she clearly has exercise-related burning chest pain that improves when she rests. He underwent cardiac catheterization that did not show any significant blockages. She takes Carafate only. She takes domperidone intermittently for abdominal bloating. She feels this is very effective.    Review of Systems: Pertinent positive and negative review of systems were noted in the above HPI section. All other review of systems were otherwise negative.    Current Medications, Allergies, Past Medical History, Past Surgical History, Family History and Social History were reviewed in [employer] electronic medical record  Vital signs were reviewed in today's medical record. Physical Exam: General: Well developed , well nourished, no acute distress

## 2012-09-26 NOTE — Telephone Encounter (Signed)
Patient has called back and states that the pharmacist said that the Prevacid was written for a liquid form and that had to filled at a compound pharmacy.  She says that [facility] at [phone] is near her.

## 2012-09-26 NOTE — Telephone Encounter (Signed)
Returned patients call. I am doing a prior auth for tge prevacid, Waiting on form to be faxed

## 2012-09-26 NOTE — Assessment & Plan Note (Addendum)
Pt has typical reflux symptoms. At the same time, exercise-related burning chest pain is worrisome for cardiac ischemia but apparently workup was negative.   Recommendations #1 begin Prevacid 30 mg one to 2 times a day; antacids as needed #2  I instructed the patient to contact me if her exercise-induced chest burning is not improved #3 discontinue Carafate

## 2012-09-26 NOTE — Telephone Encounter (Signed)
Patient was seen this morning and was prescribed Prevacid.  Her insurance will not cover it.  She called her insurance company and they told her that if we call [employer] at [phone] and tell them why she needs to take it or ask them what they will cover.

## 2012-10-01 NOTE — Telephone Encounter (Signed)
Patient approvd for prevacid solutab 06/30/2012  Till 05-22-2023

## 2012-10-30 ENCOUNTER — Ambulatory Visit: Payer: Medicare Other | Admitting: Gastroenterology

## 2012-11-11 ENCOUNTER — Telehealth: Payer: Self-pay | Admitting: Gastroenterology

## 2012-11-11 ENCOUNTER — Ambulatory Visit: Payer: Medicare Other | Admitting: Gastroenterology

## 2012-11-11 NOTE — Telephone Encounter (Signed)
No charge. 

## 2012-11-12 ENCOUNTER — Ambulatory Visit (INDEPENDENT_AMBULATORY_CARE_PROVIDER_SITE_OTHER): Payer: Medicare Other | Admitting: Ophthalmology

## 2012-11-12 DIAGNOSIS — E1065 Type 1 diabetes mellitus with hyperglycemia: Secondary | ICD-10-CM

## 2012-11-12 DIAGNOSIS — E11319 Type 2 diabetes mellitus with unspecified diabetic retinopathy without macular edema: Secondary | ICD-10-CM

## 2012-11-12 DIAGNOSIS — H2510 Age-related nuclear cataract, unspecified eye: Secondary | ICD-10-CM

## 2012-11-12 DIAGNOSIS — I10 Essential (primary) hypertension: Secondary | ICD-10-CM

## 2012-11-12 DIAGNOSIS — E11359 Type 2 diabetes mellitus with proliferative diabetic retinopathy without macular edema: Secondary | ICD-10-CM

## 2012-11-12 DIAGNOSIS — H43819 Vitreous degeneration, unspecified eye: Secondary | ICD-10-CM

## 2012-11-12 DIAGNOSIS — H35039 Hypertensive retinopathy, unspecified eye: Secondary | ICD-10-CM

## 2012-11-12 DIAGNOSIS — E1039 Type 1 diabetes mellitus with other diabetic ophthalmic complication: Secondary | ICD-10-CM

## 2012-11-19 ENCOUNTER — Other Ambulatory Visit: Payer: Self-pay | Admitting: Gastroenterology

## 2012-11-20 ENCOUNTER — Telehealth: Payer: Self-pay | Admitting: Gastroenterology

## 2012-11-20 NOTE — Telephone Encounter (Signed)
Faxed in prescription this  Morning.

## 2012-12-12 ENCOUNTER — Other Ambulatory Visit: Payer: Self-pay | Admitting: Physician Assistant

## 2012-12-17 ENCOUNTER — Ambulatory Visit (INDEPENDENT_AMBULATORY_CARE_PROVIDER_SITE_OTHER): Payer: Medicare Other | Admitting: Gastroenterology

## 2012-12-17 ENCOUNTER — Encounter: Payer: Self-pay | Admitting: Gastroenterology

## 2012-12-17 VITALS — BP 100/70 | HR 72 | Ht 64.0 in | Wt 214.5 lb

## 2012-12-17 DIAGNOSIS — K3184 Gastroparesis: Secondary | ICD-10-CM

## 2012-12-17 DIAGNOSIS — R079 Chest pain, unspecified: Secondary | ICD-10-CM

## 2012-12-17 DIAGNOSIS — K219 Gastro-esophageal reflux disease without esophagitis: Secondary | ICD-10-CM

## 2012-12-17 NOTE — Assessment & Plan Note (Signed)
The patient continues to describe exercise-induced chest burning that is relieved with rest. Her reflux symptoms seems to be under better control. I remain concerned about cardiac ischemia.  Recommendations #1 I have instructed the patient to make a followup appointment with her cardiologist, Dr. [place]

## 2012-12-17 NOTE — Patient Instructions (Addendum)
Follow up as needed

## 2012-12-17 NOTE — Assessment & Plan Note (Signed)
Patient has well-established gastroparesis but is minimally symptomatic. She will continue to use domperidone as needed.

## 2012-12-17 NOTE — Progress Notes (Signed)
History of Present Illness:  [name] reports that her reflux symptoms consisting of pyrosis clearly is improved with Prevacid. She denies dysphagia. She continues to complain of exercise-induced burning chest discomfort that is relieved with resting. She has occasional episodes of nausea. She has not been taking domperidone regularly.    Review of Systems: Pertinent positive and negative review of systems were noted in the above HPI section. All other review of systems were otherwise negative.    Current Medications, Allergies, Past Medical History, Past Surgical History, Family History and Social History were reviewed in [employer] electronic medical record  Vital signs were reviewed in today's medical record. Physical Exam: General: Well developed , well nourished, no acute distress

## 2012-12-17 NOTE — Assessment & Plan Note (Signed)
Symptoms are well controlled with Prevacid. Plan to continue with the same.

## 2013-01-09 ENCOUNTER — Encounter: Payer: Self-pay | Admitting: Obstetrics & Gynecology

## 2013-01-09 ENCOUNTER — Ambulatory Visit (INDEPENDENT_AMBULATORY_CARE_PROVIDER_SITE_OTHER): Payer: Medicare Other | Admitting: Obstetrics & Gynecology

## 2013-01-09 VITALS — BP 100/60 | Ht 64.0 in | Wt 216.0 lb

## 2013-01-09 DIAGNOSIS — N760 Acute vaginitis: Secondary | ICD-10-CM

## 2013-01-09 MED ORDER — FLUCONAZOLE 150 MG PO TABS: 150.0000 mg | ORAL_TABLET | Freq: Once | ORAL | Status: DC

## 2013-01-09 MED ORDER — METRONIDAZOLE 0.75 % VA GEL: 1.0000 | VAGINAL | Status: DC

## 2013-01-09 NOTE — Progress Notes (Signed)
Patient ID: [name], female   DOB: [birth year], 47 y.o.   MRN: [id number] Patient had not had sex in 17 months then had sex 4 times in 2 days.  Has odor and discharge  Past Medical History  Diagnosis Date  . Anxiety   . Asthma   . Diabetes mellitus     Scheduled to go on insulin pump  . History of heart attack   . GERD (gastroesophageal reflux disease)   . Esophageal stricture   . CAD (coronary artery disease)     4v CABG, 9/10; NL LVF, status post followup Cardiolite November 2012 no ischemia ejection fraction 65%  . Dyslipidemia      LDL 22 mg percent on Crestor  . Hypothyroidism   . Hypertension    Past Surgical History  Procedure Laterality Date  . Angioplasty      with stenting   . Coronary artery bypass graft  01/26/2009   OB History   Grav Para Term Preterm Abortions TAB SAB Ect Mult Living                 Exam +BV  metrogel qhs x 5  diflucan prn

## 2013-01-09 NOTE — Patient Instructions (Addendum)
Bacterial Vaginosis Bacterial vaginosis (BV) is a vaginal infection where the normal balance of bacteria in the vagina is disrupted. The normal balance is then replaced by an overgrowth of certain bacteria. There are several different kinds of bacteria that can cause BV. BV is the most common vaginal infection in women of childbearing age. CAUSES   The cause of BV is not fully understood. BV develops when there is an increase or imbalance of harmful bacteria.  Some activities or behaviors can upset the normal balance of bacteria in the vagina and put women at increased risk including:  Having a new sex partner or multiple sex partners.  Douching.  Using an intrauterine device (IUD) for contraception.  It is not clear what role sexual activity plays in the development of BV. However, women that have never had sexual intercourse are rarely infected with BV. Women do not get BV from toilet seats, bedding, swimming pools or from touching objects around them.  SYMPTOMS   Grey vaginal discharge.  A fish-like odor with discharge, especially after sexual intercourse.  Itching or burning of the vagina and vulva.  Burning or pain with urination.  Some women have no signs or symptoms at all. DIAGNOSIS  Your caregiver must examine the vagina for signs of BV. Your caregiver will perform lab tests and look at the sample of vaginal fluid through a microscope. They will look for bacteria and abnormal cells (clue cells), a pH test higher than 4.5, and a positive amine test all associated with BV.  RISKS AND COMPLICATIONS   Pelvic inflammatory disease (PID).  Infections following gynecology surgery.  Developing HIV.  Developing herpes virus. TREATMENT  Sometimes BV will clear up without treatment. However, all women with symptoms of BV should be treated to avoid complications, especially if gynecology surgery is planned. Female partners generally do not need to be treated. However, BV may spread  between female sex partners so treatment is helpful in preventing a recurrence of BV.   BV may be treated with antibiotics. The antibiotics come in either pill or vaginal cream forms. Either can be used with nonpregnant or pregnant women, but the recommended dosages differ. These antibiotics are not harmful to the baby.  BV can recur after treatment. If this happens, a second round of antibiotics will often be prescribed.  Treatment is important for pregnant women. If not treated, BV can cause a premature delivery, especially for a pregnant woman who had a premature birth in the past. All pregnant women who have symptoms of BV should be checked and treated.  For chronic reoccurrence of BV, treatment with a type of prescribed gel vaginally twice a week is helpful. HOME CARE INSTRUCTIONS   Finish all medication as directed by your caregiver.  Do not have sex until treatment is completed.  Tell your sexual partner that you have a vaginal infection. They should see their caregiver and be treated if they have problems, such as a mild rash or itching.  Practice safe sex. Use condoms. Only have 1 sex partner. PREVENTION  Basic prevention steps can help reduce the risk of upsetting the natural balance of bacteria in the vagina and developing BV:  Do not have sexual intercourse (be abstinent).  Do not douche.  Use all of the medicine prescribed for treatment of BV, even if the signs and symptoms go away.  Tell your sex partner if you have BV. That way, they can be treated, if needed, to prevent reoccurrence. SEEK MEDICAL CARE IF:     Your symptoms are not improving after 3 days of treatment.  You have increased discharge, pain, or fever. MAKE SURE YOU:   Understand these instructions.  Will watch your condition.  Will get help right away if you are not doing well or get worse. FOR MORE INFORMATION  Division of STD Prevention (DSTDP), Centers for Disease Control and Prevention:  www.cdc.gov/std American Social Health Association (ASHA): www.ashastd.org  Document Released: 05/08/2005 Document Revised: 07/31/2011 Document Reviewed: 10/29/2008 Exit[employer] Patient Information 2014 [employer].  

## 2013-02-06 ENCOUNTER — Other Ambulatory Visit (HOSPITAL_COMMUNITY)
Admission: RE | Admit: 2013-02-06 | Discharge: 2013-02-06 | Disposition: A | Discharge status: Home / Self Care | Payer: Medicare Other | Source: Ambulatory Visit | Attending: Obstetrics & Gynecology | Admitting: Obstetrics & Gynecology

## 2013-02-06 ENCOUNTER — Encounter: Payer: Self-pay | Admitting: Obstetrics & Gynecology

## 2013-02-06 ENCOUNTER — Ambulatory Visit (INDEPENDENT_AMBULATORY_CARE_PROVIDER_SITE_OTHER): Payer: Medicare Other | Admitting: Obstetrics & Gynecology

## 2013-02-06 VITALS — BP 134/68 | Ht 62.5 in | Wt 220.0 lb

## 2013-02-06 DIAGNOSIS — Z124 Encounter for screening for malignant neoplasm of cervix: Secondary | ICD-10-CM | POA: Insufficient documentation

## 2013-02-06 DIAGNOSIS — Z01419 Encounter for gynecological examination (general) (routine) without abnormal findings: Secondary | ICD-10-CM

## 2013-02-06 DIAGNOSIS — Z1151 Encounter for screening for human papillomavirus (HPV): Secondary | ICD-10-CM | POA: Insufficient documentation

## 2013-02-06 NOTE — Progress Notes (Signed)
Patient ID: [name], female   DOB: 13-Sep-1965, 47 y.o.   MRN: [id number] Subjective:     [name] is a 47 y.o. female here for a routine exam.  Patient's last menstrual period was 12/26/2012. No obstetric history on file. Current complaints: none.  Personal health questionnaire reviewed: no.   Gynecologic History Patient's last menstrual period was 12/26/2012. Contraception: tubal ligation Last Pap: 2013. Results were: normal Last mammogram: 2011. Results were: normal  Obstetric History OB History  No data available     The following portions of the patient's history were reviewed and updated as appropriate: allergies, current medications, past family history, past medical history, past social history, past surgical history and problem list.  Review of Systems  Review of Systems  Constitutional: Negative for fever, chills, weight loss, malaise/fatigue and diaphoresis.  HENT: Negative for hearing loss, ear pain, nosebleeds, congestion, sore throat, neck pain, tinnitus and ear discharge.   Eyes: Negative for blurred vision, double vision, photophobia, pain, discharge and redness.  Respiratory: Negative for cough, hemoptysis, sputum production, shortness of breath, wheezing and stridor.   Cardiovascular: Negative for chest pain, palpitations, orthopnea, claudication, leg swelling and PND.  Gastrointestinal: negative for abdominal pain. Negative for heartburn, nausea, vomiting, diarrhea, constipation, blood in stool and melena.  Genitourinary: Negative for dysuria, urgency, frequency, hematuria and flank pain.  Musculoskeletal: Negative for myalgias, back pain, joint pain and falls.  Skin: Negative for itching and rash.  Neurological: Negative for dizziness, tingling, tremors, sensory change, speech change, focal weakness, seizures, loss of consciousness, weakness and headaches.  Endo/Heme/Allergies: Negative for environmental allergies and polydipsia. Does not  bruise/bleed easily.  Psychiatric/Behavioral: Negative for depression, suicidal ideas, hallucinations, memory loss and substance abuse. The patient is not nervous/anxious and does not have insomnia.        Objective:    Physical Exam  Vitals reviewed. Constitutional: She is oriented to person, place, and time. She appears well-developed and well-nourished.  HENT:  Head: Normocephalic and atraumatic.        Right Ear: External ear normal.  Left Ear: External ear normal.  Nose: Nose normal.  Mouth/Throat: Oropharynx is clear and moist.  Eyes: Conjunctivae and EOM are normal. Pupils are equal, round, and reactive to light. Right eye exhibits no discharge. Left eye exhibits no discharge. No scleral icterus.  Neck: Normal range of motion. Neck supple. No tracheal deviation present. No thyromegaly present.  Cardiovascular: Normal rate, regular rhythm, normal heart sounds and intact distal pulses.  Exam reveals no gallop and no friction rub.   No murmur heard. Respiratory: Effort normal and breath sounds normal. No respiratory distress. She has no wheezes. She has no rales. She exhibits no tenderness.  GI: Soft. Bowel sounds are normal. She exhibits no distension and no mass. There is no tenderness. There is no rebound and no guarding.  Genitourinary:  Breasts no masses discharge or skin changes      Vulva is normal without lesions Vagina is pink moist without discharge Cervix normal in appearance and pap is done Uterus is normal size shape and contour Adnexa is negative with normal sized ovaries   Musculoskeletal: Normal range of motion. She exhibits no edema and no tenderness.  Neurological: She is alert and oriented to person, place, and time. She has normal reflexes. She displays normal reflexes. No cranial nerve deficit. She exhibits normal muscle tone. Coordination normal.  Skin: Skin is warm and dry. No rash noted. No erythema. No pallor.  Psychiatric: She has  a normal mood and  affect. Her behavior is normal. Judgment and thought content normal.       Assessment:    Healthy female exam.    Plan:    Contraception: tubal ligation. Mammogram ordered. Follow up in: 1 year.

## 2013-02-18 ENCOUNTER — Emergency Department (HOSPITAL_COMMUNITY)
Admission: EM | Admit: 2013-02-18 | Discharge: 2013-02-18 | Disposition: A | Discharge status: Home / Self Care | Payer: Medicare Other | Attending: Emergency Medicine | Admitting: Emergency Medicine

## 2013-02-18 ENCOUNTER — Encounter (HOSPITAL_COMMUNITY): Payer: Self-pay | Admitting: *Deleted

## 2013-02-18 DIAGNOSIS — F411 Generalized anxiety disorder: Secondary | ICD-10-CM | POA: Insufficient documentation

## 2013-02-18 DIAGNOSIS — Z9861 Coronary angioplasty status: Secondary | ICD-10-CM | POA: Insufficient documentation

## 2013-02-18 DIAGNOSIS — J40 Bronchitis, not specified as acute or chronic: Secondary | ICD-10-CM | POA: Insufficient documentation

## 2013-02-18 DIAGNOSIS — J029 Acute pharyngitis, unspecified: Secondary | ICD-10-CM | POA: Insufficient documentation

## 2013-02-18 DIAGNOSIS — K219 Gastro-esophageal reflux disease without esophagitis: Secondary | ICD-10-CM | POA: Insufficient documentation

## 2013-02-18 DIAGNOSIS — E119 Type 2 diabetes mellitus without complications: Secondary | ICD-10-CM | POA: Insufficient documentation

## 2013-02-18 DIAGNOSIS — I2510 Atherosclerotic heart disease of native coronary artery without angina pectoris: Secondary | ICD-10-CM | POA: Insufficient documentation

## 2013-02-18 DIAGNOSIS — Z79899 Other long term (current) drug therapy: Secondary | ICD-10-CM | POA: Insufficient documentation

## 2013-02-18 DIAGNOSIS — I252 Old myocardial infarction: Secondary | ICD-10-CM | POA: Insufficient documentation

## 2013-02-18 DIAGNOSIS — E039 Hypothyroidism, unspecified: Secondary | ICD-10-CM | POA: Insufficient documentation

## 2013-02-18 DIAGNOSIS — Z7982 Long term (current) use of aspirin: Secondary | ICD-10-CM | POA: Insufficient documentation

## 2013-02-18 DIAGNOSIS — Z794 Long term (current) use of insulin: Secondary | ICD-10-CM | POA: Insufficient documentation

## 2013-02-18 DIAGNOSIS — M436 Torticollis: Secondary | ICD-10-CM | POA: Insufficient documentation

## 2013-02-18 DIAGNOSIS — I10 Essential (primary) hypertension: Secondary | ICD-10-CM | POA: Insufficient documentation

## 2013-02-18 DIAGNOSIS — Z880 Allergy status to penicillin: Secondary | ICD-10-CM | POA: Insufficient documentation

## 2013-02-18 DIAGNOSIS — Z87891 Personal history of nicotine dependence: Secondary | ICD-10-CM | POA: Insufficient documentation

## 2013-02-18 DIAGNOSIS — J45909 Unspecified asthma, uncomplicated: Secondary | ICD-10-CM | POA: Insufficient documentation

## 2013-02-18 DIAGNOSIS — Z951 Presence of aortocoronary bypass graft: Secondary | ICD-10-CM | POA: Insufficient documentation

## 2013-02-18 DIAGNOSIS — E785 Hyperlipidemia, unspecified: Secondary | ICD-10-CM | POA: Insufficient documentation

## 2013-02-18 MED ORDER — ALBUTEROL SULFATE HFA 108 (90 BASE) MCG/ACT IN AERS
2.0000 | INHALATION_SPRAY | RESPIRATORY_TRACT | Status: DC | PRN
  Administered 2013-02-18: 2 via RESPIRATORY_TRACT

## 2013-02-18 MED ORDER — HYDROCODONE-ACETAMINOPHEN 5-325 MG PO TABS: 1.0000 | ORAL_TABLET | ORAL | Status: DC | PRN

## 2013-02-18 MED ORDER — AZITHROMYCIN 200 MG/5ML PO SUSR: ORAL | Status: DC

## 2013-02-18 MED ORDER — IPRATROPIUM BROMIDE 0.02 % IN SOLN
0.5000 mg | Freq: Once | RESPIRATORY_TRACT | Status: AC
  Administered 2013-02-18: 0.5 mg via RESPIRATORY_TRACT
  Filled 2013-02-18: qty 2.5

## 2013-02-18 MED ORDER — ALBUTEROL SULFATE HFA 108 (90 BASE) MCG/ACT IN AERS
INHALATION_SPRAY | RESPIRATORY_TRACT | Status: AC
  Filled 2013-02-18: qty 6.7

## 2013-02-18 MED ORDER — CYCLOBENZAPRINE HCL 10 MG PO TABS: 10.0000 mg | ORAL_TABLET | Freq: Two times a day (BID) | ORAL | Status: DC | PRN

## 2013-02-18 MED ORDER — ALBUTEROL SULFATE (5 MG/ML) 0.5% IN NEBU
2.5000 mg | INHALATION_SOLUTION | Freq: Once | RESPIRATORY_TRACT | Status: AC
  Administered 2013-02-18: 2.5 mg via RESPIRATORY_TRACT
  Filled 2013-02-18: qty 0.5

## 2013-02-18 NOTE — ED Notes (Signed)
Pt c/o congestion and cough since last night. Pt also reports neck pain x4 days. Pt has hx of chronic neck pain. Pt denies injury.

## 2013-02-18 NOTE — ED Provider Notes (Signed)
CSN: [id number]     Arrival date & time 02/18/13  1240 History   First MD Initiated Contact with Patient 02/18/13 1518     Chief Complaint  Patient presents with  . Cough   (Consider location/radiation/quality/duration/timing/severity/associated sxs/prior Treatment) Patient is a 47 y.o. female presenting with cough. The history is provided by the patient.  Cough Cough characteristics:  Productive Sputum characteristics:  Yellow Severity:  Moderate Onset quality:  Gradual Duration:  4 days Timing:  Constant Progression:  Worsening Chronicity:  New Smoker: former smoker.   Relieved by:  Nothing Associated symptoms: myalgias and sore throat   Associated symptoms: no chills, no fever, no headaches and no rash    [name] is a 47 y.o. female who presents to the ED with cough, cold and congestion that started 4 days ago. The symptoms have gotten worse. She has not taken her temperature but has felt warm and had chills.  Past Medical History  Diagnosis Date  . Anxiety   . Asthma   . Diabetes mellitus     Scheduled to go on insulin pump  . History of heart attack   . GERD (gastroesophageal reflux disease)   . Esophageal stricture   . CAD (coronary artery disease)     4v CABG, 9/10; NL LVF, status post followup Cardiolite November 2012 no ischemia ejection fraction 65%  . Dyslipidemia      LDL 22 mg percent on Crestor  . Hypothyroidism   . Hypertension    Past Surgical History  Procedure Laterality Date  . Angioplasty      with stenting   . Coronary artery bypass graft  01/26/2009  . Open heart surgery   2010  . Cardiac surgery    . Cesarean section     Family History  Problem Relation Age of Onset  . Colon cancer Neg Hx   . Heart disease Father   . Diabetes Mother    History  Substance Use Topics  . Smoking status: Former Smoker -- 1.00 packs/day for 3 years    Types: Cigarettes    Quit date: 05/22/1993  . Smokeless tobacco: Never Used     Comment:  Year  Quit: 1995  . Alcohol Use: No   OB History   Grav Para Term Preterm Abortions TAB SAB Ect Mult Living                 Review of Systems  Constitutional: Negative for fever and chills.  HENT: Positive for sore throat and neck pain (right side). Negative for trouble swallowing.   Respiratory: Positive for cough.   Cardiovascular: Negative for leg swelling.  Gastrointestinal: Negative for nausea and vomiting.  Genitourinary: Negative for pelvic pain.  Musculoskeletal: Positive for myalgias.  Skin: Negative for rash.  Neurological: Negative for syncope and headaches.  Psychiatric/Behavioral: The patient is not nervous/anxious.     Allergies  Metformin and Penicillins  Home Medications   Current Outpatient Rx  Name  Route  Sig  Dispense  Refill  . albuterol (PROVENTIL) (2.5 MG/3ML) 0.083% nebulizer solution   Nebulization   Take 2.5 mg by nebulization every 4 (four) hours as needed. For wheezing.         Marland Kitchen ALPRAZolam (XANAX) 0.5 MG tablet   Oral   Take 0.5 mg by mouth 2 (two) times daily as needed for anxiety.         . AMBULATORY NON FORMULARY MEDICATION   Oral   Take 10 mg by mouth  as needed (digestion). Medication Name: DOMPERIDONE 10MG  1 BY MOUTH 3 TIMES A DAY 30 MINUTES BEFORE MEALS AND AT BEDTIME         . aspirin 81 MG chewable tablet   Oral   Chew 81 mg by mouth daily.         . fish oil-omega-3 fatty acids 1000 MG capsule   Oral   Take 2 capsules (2 g total) by mouth 2 (two) times daily.         . Fluticasone-Salmeterol (ADVAIR DISKUS) 100-50 MCG/DOSE AEPB   Inhalation   Inhale 1 puff into the lungs every 4 (four) hours as needed (wheezing).          . hyoscyamine (LEVSIN SL) 0.125 MG SL tablet   Sublingual   Place 0.25 mg under the tongue every 4 (four) hours as needed for cramping (pain in stomach).         . insulin aspart (NOVOLOG) 100 UNIT/ML injection   Continuous infusion (non-IV)   by Continuous infusion (non-IV) route daily. Insulin  pump         . lansoprazole (PREVACID SOLUTAB) 30 MG disintegrating tablet   Oral   Take 1 tablet (30 mg total) by mouth daily.   60 tablet   2   . levothyroxine (SYNTHROID, LEVOTHROID) 88 MCG tablet   Oral   Take 88 mcg by mouth daily.         . metoprolol tartrate (LOPRESSOR) 25 MG tablet   Oral   Take 0.5 tablets (12.5 mg total) by mouth 2 (two) times daily.   15 tablet   6     D/C Norvasc, D/C Toprol XL.  Finish Atenolol, then ...   . rosuvastatin (CRESTOR) 10 MG tablet   Oral   Take 1 tablet (10 mg total) by mouth every evening.           Dose increased 08/22/2012 - already had 10mg  tabs at ...   . nitroGLYCERIN (NITROSTAT) 0.4 MG SL tablet   Sublingual   Place 1 tablet (0.4 mg total) under the tongue every 5 (five) minutes as needed for chest pain.   25 tablet   3    BP 122/68  Pulse 99  Temp(Src) 98 F (36.7 C) (Oral)  Resp 20  Ht 5\' 4"  (1.626 m)  Wt 212 lb (96.163 kg)  BMI 36.37 kg/m2  SpO2 99%  LMP 02/09/2013 Physical Exam  Nursing note and vitals reviewed. Constitutional: She is oriented to person, place, and time. She appears well-developed and well-nourished.  HENT:  Head: Normocephalic and atraumatic.  Eyes: EOM are normal.  Neck: Neck supple. Muscular tenderness present. No spinous process tenderness present. Decreased range of motion present. No erythema present.    Cardiovascular: Normal rate and regular rhythm.   Pulmonary/Chest: Effort normal. She has wheezes. Rhonchi: occasional. She has no rales.  Abdominal: Soft. Bowel sounds are normal. There is no tenderness.  Musculoskeletal: She exhibits no edema.  Neurological: She is alert and oriented to person, place, and time. No cranial nerve deficit.  Skin: Skin is warm and dry.  Psychiatric: She has a normal mood and affect. Her behavior is normal.    ED Course  Procedures  MDM  47 y.o. female with cough cold and congestion and right side neck pain x 4 days. Will treat for  bronchitis and torticollis. Albuterol Inhaler given with instructions in ED.muscle relaxants for torticollis and cough medication.  Prescriptions written as the computers were down at the  time of discharge. Patient stable for discharge without any immediate complications.  Discussed with the patient findings and plan of care and all questioned fully answered. She will return if any problems arise.     Janne Napoleon, [place] 02/23/13 [phone]

## 2013-02-18 NOTE — ED Notes (Signed)
Cough , congestion , hoarse, Pain rt side of neck for 4 days

## 2013-02-27 NOTE — ED Provider Notes (Signed)
Medical screening examination/treatment/procedure(s) were performed by non-physician practitioner and as supervising physician I was immediately available for consultation/collaboration.   Shelda Jakes, MD 02/27/13 1125

## 2013-03-10 ENCOUNTER — Encounter: Payer: Self-pay | Admitting: Cardiology

## 2013-03-10 DIAGNOSIS — R9430 Abnormal result of cardiovascular function study, unspecified: Secondary | ICD-10-CM | POA: Insufficient documentation

## 2013-03-10 DIAGNOSIS — Z951 Presence of aortocoronary bypass graft: Secondary | ICD-10-CM | POA: Insufficient documentation

## 2013-03-17 ENCOUNTER — Ambulatory Visit (INDEPENDENT_AMBULATORY_CARE_PROVIDER_SITE_OTHER): Payer: Medicare Other | Admitting: Cardiology

## 2013-03-17 ENCOUNTER — Encounter: Payer: Self-pay | Admitting: Cardiology

## 2013-03-17 VITALS — BP 112/74 | HR 65 | Ht 64.0 in | Wt 215.8 lb

## 2013-03-17 DIAGNOSIS — E119 Type 2 diabetes mellitus without complications: Secondary | ICD-10-CM

## 2013-03-17 DIAGNOSIS — Z951 Presence of aortocoronary bypass graft: Secondary | ICD-10-CM

## 2013-03-17 DIAGNOSIS — E785 Hyperlipidemia, unspecified: Secondary | ICD-10-CM

## 2013-03-17 DIAGNOSIS — K219 Gastro-esophageal reflux disease without esophagitis: Secondary | ICD-10-CM

## 2013-03-17 DIAGNOSIS — I2510 Atherosclerotic heart disease of native coronary artery without angina pectoris: Secondary | ICD-10-CM

## 2013-03-17 DIAGNOSIS — I10 Essential (primary) hypertension: Secondary | ICD-10-CM

## 2013-03-17 DIAGNOSIS — R002 Palpitations: Secondary | ICD-10-CM

## 2013-03-17 NOTE — Assessment & Plan Note (Signed)
She has an insulin pump that she is very pleased with.

## 2013-03-17 NOTE — Assessment & Plan Note (Signed)
We know that her graft were patent in April, 2014. She does have some residual disease that could possibly cause symptoms. Currently she has some chest burning that is chronic. It occurs when she walks at a higher level of exercise. She's already on some beta-blockade. I discussed the possibility of using them nor with her to see if it helps with his symptoms. I explained that this was not absolutely necessary. She felt that she did not need treatment. No change in therapy.

## 2013-03-17 NOTE — Assessment & Plan Note (Signed)
Blood pressure is nicely controlled. No change in therapy. 

## 2013-03-17 NOTE — Assessment & Plan Note (Signed)
She underwent bypass surgery in 2010. Catheterization in April, 2014 showed that her grafts were nicely patent.

## 2013-03-17 NOTE — Assessment & Plan Note (Signed)
The patient has dyslipidemia. She's on 10 mg of Crestor. I talked with her about increasing it to 20 mg. She and her husband both said that she had symptoms from 20 mg. Therefore we decided to stay at 10 mg daily.  As part of today's evaluation I spent greater than 25 minutes with her total care. I've seen her for the first time. I've updated all of her records. More than half of 25 minutes was spent reviewing her case completely with her and her husband.

## 2013-03-17 NOTE — Assessment & Plan Note (Signed)
She does have reflux disease that is being treated. This may possibly play a role with some of her symptoms.

## 2013-03-17 NOTE — Patient Instructions (Signed)

## 2013-03-17 NOTE — Assessment & Plan Note (Signed)
Patient has not been having any significant palpitations. No further workup.

## 2013-03-17 NOTE — Progress Notes (Signed)
HPI  The patient is seen for the followup of coronary disease. I have never seen this patient before. She was previously followed by Dr. Andee Lineman. Also she was seen in our office by Mr. Serpe April, 2014. Patient underwent bypass surgery in 2010. She's had some persistent chest burning. Catheterization was done April, 2014. It showed that her grafts were all very nicely patent. Her ejection fraction is 55%. She has significant GERD. However she also has some mild chest burning when she exercises at a higher level. She does have some residual disease. The chest burning does not limit her.  Allergies  Allergen Reactions  . Metformin Anaphylaxis  . Penicillins Anaphylaxis    Current Outpatient Prescriptions  Medication Sig Dispense Refill  . albuterol (PROVENTIL) (2.5 MG/3ML) 0.083% nebulizer solution Take 2.5 mg by nebulization every 4 (four) hours as needed. For wheezing.      Marland Kitchen ALPRAZolam (XANAX) 0.5 MG tablet Take 0.5 mg by mouth 2 (two) times daily as needed for anxiety.      . AMBULATORY NON FORMULARY MEDICATION Take 10 mg by mouth as needed (digestion). Medication Name: DOMPERIDONE 10MG  1 BY MOUTH 3 TIMES A DAY 30 MINUTES BEFORE MEALS AND AT BEDTIME      . aspirin 81 MG chewable tablet Chew 81 mg by mouth daily.      . fish oil-omega-3 fatty acids 1000 MG capsule Take 2 capsules (2 g total) by mouth 2 (two) times daily.      . Fluticasone-Salmeterol (ADVAIR DISKUS) 100-50 MCG/DOSE AEPB Inhale 1 puff into the lungs every 4 (four) hours as needed (wheezing).       . hyoscyamine (LEVSIN SL) 0.125 MG SL tablet Place 0.25 mg under the tongue every 4 (four) hours as needed for cramping (pain in stomach).      . insulin aspart (NOVOLOG) 100 UNIT/ML injection by Continuous infusion (non-IV) route daily. Insulin pump      . lansoprazole (PREVACID SOLUTAB) 30 MG disintegrating tablet Take 1 tablet (30 mg total) by mouth daily.  60 tablet  2  . levothyroxine (SYNTHROID, LEVOTHROID) 88 MCG tablet  Take 88 mcg by mouth daily.      . metoprolol tartrate (LOPRESSOR) 25 MG tablet Take 0.5 tablets (12.5 mg total) by mouth 2 (two) times daily.  15 tablet  6  . nitroGLYCERIN (NITROSTAT) 0.4 MG SL tablet Place 1 tablet (0.4 mg total) under the tongue every 5 (five) minutes as needed for chest pain.  25 tablet  3  . rosuvastatin (CRESTOR) 10 MG tablet Take 1 tablet (10 mg total) by mouth every evening.       No current facility-administered medications for this visit.    History   Social History  . Marital Status: Single    Spouse Name: N/A    Number of Children: 1  . Years of Education: N/A   Occupational History  . disabilied    Social History Main Topics  . Smoking status: Former Smoker -- 1.00 packs/day for 3 years    Types: Cigarettes    Quit date: 05/22/1993  . Smokeless tobacco: Never Used     Comment:  Year Quit: 1995  . Alcohol Use: No  . Drug Use: No  . Sexual Activity: Yes   Other Topics Concern  . Not on file   Social History Narrative  . No narrative on file    Family History  Problem Relation Age of Onset  . Colon cancer Neg Hx   .  Heart disease Father   . Diabetes Mother     Past Medical History  Diagnosis Date  . Anxiety   . Asthma   . Diabetes mellitus     Scheduled to go on insulin pump  . History of heart attack   . GERD (gastroesophageal reflux disease)   . Esophageal stricture   . CAD (coronary artery disease)     4v CABG, 9/10; NL LVF, status post followup Cardiolite November 2012 no ischemia ejection fraction 65%  . Dyslipidemia      LDL 22 mg percent on Crestor  . Hypothyroidism   . Hypertension   . Hx of CABG     September, 2010  . Ejection fraction     Past Surgical History  Procedure Laterality Date  . Angioplasty      with stenting   . Coronary artery bypass graft  01/26/2009  . Open heart surgery   2010  . Cardiac surgery    . Cesarean section      Patient Active Problem List   Diagnosis Date Noted  . Hx of CABG   .  Ejection fraction   . HTN (hypertension) 08/22/2012  . Hypothyroidism 09/29/2011  . Edema 08/25/2011  . Gastroparesis 09/07/2010  . DYSLIPIDEMIA 02/21/2010  . MUSCULOSKELETAL PAIN 04/29/2009  . PALPITATIONS 04/12/2009  . WHEEZING 04/12/2009  . GERD 01/19/2009  . DIABETES MELLITUS 06/12/2007  . ANXIETY 06/12/2007  . CORONARY ARTERY DISEASE 06/12/2007  . ASTHMA 06/12/2007    ROS   Patient denies fever, chills, headache, sweats, rash, change in vision, change in hearing, cough, nausea or vomiting, urinary symptoms. All other systems are reviewed and are negative.  PHYSICAL EXAM  Patient is oriented to person time and place. Affect is normal. She is here with her husband. There is no jugulovenous distention. Lungs are clear. Respiratory effort is not labored. Cardiac exam reveals S1 and S2. There no clicks or significant murmurs. The abdomen is soft. There is no peripheral edema. There are no musculoskeletal deformities.  Filed Vitals:   03/17/13 1313  BP: 112/74  Pulse: 65  Height: 5\' 4"  (1.626 m)  Weight: 215 lb 12.8 oz (97.886 kg)  SpO2: 98%     ASSESSMENT & PLAN

## 2013-03-20 ENCOUNTER — Other Ambulatory Visit: Payer: Self-pay | Admitting: Gastroenterology

## 2013-03-20 ENCOUNTER — Telehealth: Payer: Self-pay | Admitting: Gastroenterology

## 2013-03-20 NOTE — Telephone Encounter (Signed)
yes

## 2013-03-20 NOTE — Telephone Encounter (Signed)
Dr Arlyce Dice can I refill patients [name]

## 2013-03-21 MED ORDER — ALPRAZOLAM 0.5 MG PO TABS: 0.5000 mg | ORAL_TABLET | Freq: Two times a day (BID) | ORAL | Status: DC | PRN

## 2013-03-21 NOTE — Telephone Encounter (Signed)
Med faxed to pharmacy Pt aware

## 2013-04-21 ENCOUNTER — Other Ambulatory Visit: Payer: Self-pay | Admitting: Physician Assistant

## 2013-05-18 ENCOUNTER — Other Ambulatory Visit: Payer: Self-pay | Admitting: Physician Assistant

## 2013-05-20 ENCOUNTER — Ambulatory Visit (INDEPENDENT_AMBULATORY_CARE_PROVIDER_SITE_OTHER): Payer: Medicare Other | Admitting: Ophthalmology

## 2013-05-20 DIAGNOSIS — H35039 Hypertensive retinopathy, unspecified eye: Secondary | ICD-10-CM

## 2013-05-20 DIAGNOSIS — E1139 Type 2 diabetes mellitus with other diabetic ophthalmic complication: Secondary | ICD-10-CM

## 2013-05-20 DIAGNOSIS — H3530 Unspecified macular degeneration: Secondary | ICD-10-CM

## 2013-05-20 DIAGNOSIS — E11319 Type 2 diabetes mellitus with unspecified diabetic retinopathy without macular edema: Secondary | ICD-10-CM

## 2013-05-20 DIAGNOSIS — I10 Essential (primary) hypertension: Secondary | ICD-10-CM

## 2013-05-20 DIAGNOSIS — E11359 Type 2 diabetes mellitus with proliferative diabetic retinopathy without macular edema: Secondary | ICD-10-CM

## 2013-05-20 DIAGNOSIS — H3581 Retinal edema: Secondary | ICD-10-CM

## 2013-06-16 ENCOUNTER — Encounter (HOSPITAL_COMMUNITY): Payer: Self-pay | Admitting: Emergency Medicine

## 2013-06-16 ENCOUNTER — Emergency Department (HOSPITAL_COMMUNITY): Payer: Medicare Other

## 2013-06-16 ENCOUNTER — Emergency Department (HOSPITAL_COMMUNITY)
Admission: EM | Admit: 2013-06-16 | Discharge: 2013-06-16 | Disposition: A | Discharge status: Home / Self Care | Payer: Medicare Other | Attending: Emergency Medicine | Admitting: Emergency Medicine

## 2013-06-16 DIAGNOSIS — I252 Old myocardial infarction: Secondary | ICD-10-CM | POA: Insufficient documentation

## 2013-06-16 DIAGNOSIS — Z880 Allergy status to penicillin: Secondary | ICD-10-CM | POA: Insufficient documentation

## 2013-06-16 DIAGNOSIS — M545 Low back pain, unspecified: Secondary | ICD-10-CM | POA: Insufficient documentation

## 2013-06-16 DIAGNOSIS — Z9889 Other specified postprocedural states: Secondary | ICD-10-CM | POA: Insufficient documentation

## 2013-06-16 DIAGNOSIS — Z87891 Personal history of nicotine dependence: Secondary | ICD-10-CM | POA: Insufficient documentation

## 2013-06-16 DIAGNOSIS — Z7982 Long term (current) use of aspirin: Secondary | ICD-10-CM | POA: Insufficient documentation

## 2013-06-16 DIAGNOSIS — R0789 Other chest pain: Secondary | ICD-10-CM | POA: Insufficient documentation

## 2013-06-16 DIAGNOSIS — Z794 Long term (current) use of insulin: Secondary | ICD-10-CM | POA: Insufficient documentation

## 2013-06-16 DIAGNOSIS — E039 Hypothyroidism, unspecified: Secondary | ICD-10-CM | POA: Insufficient documentation

## 2013-06-16 DIAGNOSIS — Z951 Presence of aortocoronary bypass graft: Secondary | ICD-10-CM | POA: Insufficient documentation

## 2013-06-16 DIAGNOSIS — J45909 Unspecified asthma, uncomplicated: Secondary | ICD-10-CM | POA: Insufficient documentation

## 2013-06-16 DIAGNOSIS — I2510 Atherosclerotic heart disease of native coronary artery without angina pectoris: Secondary | ICD-10-CM | POA: Insufficient documentation

## 2013-06-16 DIAGNOSIS — F411 Generalized anxiety disorder: Secondary | ICD-10-CM | POA: Insufficient documentation

## 2013-06-16 DIAGNOSIS — K219 Gastro-esophageal reflux disease without esophagitis: Secondary | ICD-10-CM | POA: Insufficient documentation

## 2013-06-16 DIAGNOSIS — M549 Dorsalgia, unspecified: Secondary | ICD-10-CM

## 2013-06-16 DIAGNOSIS — R079 Chest pain, unspecified: Secondary | ICD-10-CM

## 2013-06-16 DIAGNOSIS — I10 Essential (primary) hypertension: Secondary | ICD-10-CM | POA: Insufficient documentation

## 2013-06-16 DIAGNOSIS — E119 Type 2 diabetes mellitus without complications: Secondary | ICD-10-CM | POA: Insufficient documentation

## 2013-06-16 DIAGNOSIS — E785 Hyperlipidemia, unspecified: Secondary | ICD-10-CM | POA: Insufficient documentation

## 2013-06-16 DIAGNOSIS — Z79899 Other long term (current) drug therapy: Secondary | ICD-10-CM | POA: Insufficient documentation

## 2013-06-16 LAB — COMPREHENSIVE METABOLIC PANEL
ALT: 17 U/L (ref 0–35)
AST: 18 U/L (ref 0–37)
Albumin: 3.4 g/dL — ABNORMAL LOW (ref 3.5–5.2)
Alkaline Phosphatase: 142 U/L — ABNORMAL HIGH (ref 39–117)
BUN: 6 mg/dL (ref 6–23)
CO2: 23 mEq/L (ref 19–32)
CREATININE: 0.56 mg/dL (ref 0.50–1.10)
Calcium: 9.3 mg/dL (ref 8.4–10.5)
Chloride: 104 mEq/L (ref 96–112)
GFR calc Af Amer: >90 mL/min (ref >90)
GFR calc non Af Amer: >90 mL/min (ref >90)
Glucose, Bld: 198 mg/dL — ABNORMAL HIGH (ref 70–99)
Potassium: 4.3 mEq/L (ref 3.7–5.3)
Sodium: 140 mEq/L (ref 137–147)
Total Bilirubin: 0.3 mg/dL (ref 0.3–1.2)
Total Protein: 7 g/dL (ref 6.0–8.3)

## 2013-06-16 LAB — CBC WITH DIFFERENTIAL/PLATELET
BASOS PCT: 0 % (ref 0–1)
Basophils Absolute: 0 10*3/uL (ref 0.0–0.1)
EOS PCT: 2 % (ref 0–5)
Eosinophils Absolute: 0.2 10*3/uL (ref 0.0–0.7)
HEMATOCRIT: 36.7 % (ref 36.0–46.0)
Hemoglobin: 12.2 g/dL (ref 12.0–15.0)
Lymphocytes Relative: 28 % (ref 12–46)
Lymphs Abs: 2.8 10*3/uL (ref 0.7–4.0)
MCH: 27.9 pg (ref 26.0–34.0)
MCHC: 33.2 g/dL (ref 30.0–36.0)
MCV: 84 fL (ref 78.0–100.0)
MONO ABS: 0.5 10*3/uL (ref 0.1–1.0)
MONOS PCT: 5 % (ref 3–12)
Neutro Abs: 6.6 10*3/uL (ref 1.7–7.7)
Neutrophils Relative %: 65 % (ref 43–77)
Platelets: 314 10*3/uL (ref 150–400)
RBC: 4.37 MIL/uL (ref 3.87–5.11)
RDW: 15.1 % (ref 11.5–15.5)
WBC: 10.1 10*3/uL (ref 4.0–10.5)

## 2013-06-16 LAB — TROPONIN I

## 2013-06-16 MED ORDER — OXYCODONE-ACETAMINOPHEN 5-325 MG PO TABS: 1.0000 | ORAL_TABLET | ORAL | Status: DC | PRN

## 2013-06-16 NOTE — ED Provider Notes (Signed)
CSN: [id number]     Arrival date & time 06/16/13  1038 History   First MD Initiated Contact with Patient 06/16/13 1601     Chief Complaint  Patient presents with  . Back Pain    Patient is a 48 y.o. female presenting with back pain. The history is provided by the patient.  Back Pain Location:  Thoracic spine Quality:  Aching Radiates to:  Does not radiate Pain severity:  Moderate Onset quality:  Gradual Duration:  3 days Timing:  Constant Progression:  Worsening Chronicity:  New Context comment:  Moving houses, lying on air bed Relieved by:  Nothing Worsened by:  Movement Associated symptoms: chest pain   Associated symptoms: no abdominal pain, no bladder incontinence, no bowel incontinence, no dysuria, no fever and no weakness   pt reports thoracic back pain for past 3 days No trauma/falls No fever/vomiting No LE weakness No fecal/urinary incontinence No h/o back surgery She thinks it is related to lying on an air bed  She also mentions left sided chest soreness for over a month, reports it feels "Sore" No increased SOB is reported   Past Medical History  Diagnosis Date  . Anxiety   . Asthma   . Diabetes mellitus     Scheduled to go on insulin pump  . History of heart attack   . GERD (gastroesophageal reflux disease)   . Esophageal stricture   . CAD (coronary artery disease)     4v CABG, 9/10; NL LVF, status post followup Cardiolite November 2012 no ischemia ejection fraction 65%  . Dyslipidemia      LDL 22 mg percent on Crestor  . Hypothyroidism   . Hypertension   . Hx of CABG     September, 2010  . Ejection fraction    Past Surgical History  Procedure Laterality Date  . Angioplasty      with stenting   . Coronary artery bypass graft  01/26/2009  . Open heart surgery   2010  . Cardiac surgery    . Cesarean section     Family History  Problem Relation Age of Onset  . Colon cancer Neg Hx   . Heart disease Father   . Diabetes Mother    History   Substance Use Topics  . Smoking status: Former Smoker -- 1.00 packs/day for 3 years    Types: Cigarettes    Quit date: 05/22/1993  . Smokeless tobacco: Never Used     Comment:  Year Quit: 1995  . Alcohol Use: No   OB History   Grav Para Term Preterm Abortions TAB SAB Ect Mult Living                 Review of Systems  Constitutional: Negative for fever.  Cardiovascular: Positive for chest pain.  Gastrointestinal: Negative for abdominal pain and bowel incontinence.  Genitourinary: Negative for bladder incontinence, dysuria and difficulty urinating.  Musculoskeletal: Positive for back pain.  Neurological: Negative for weakness.  All other systems reviewed and are negative.    Allergies  Metformin and Penicillins  Home Medications   Current Outpatient Rx  Name  Route  Sig  Dispense  Refill  . albuterol (PROVENTIL) (2.5 MG/3ML) 0.083% nebulizer solution   Nebulization   Take 2.5 mg by nebulization every 4 (four) hours as needed. For wheezing.         Marland Kitchen ALPRAZolam (XANAX) 0.5 MG tablet   Oral   Take 0.5 mg by mouth 2 (two) times daily as needed  for anxiety.         . AMBULATORY NON FORMULARY MEDICATION   Oral   Take 10 mg by mouth as needed (digestion). Medication Name: DOMPERIDONE 10MG  1 BY MOUTH 3 TIMES A DAY 30 MINUTES BEFORE MEALS AND AT BEDTIME         . aspirin 81 MG chewable tablet   Oral   Chew 81 mg by mouth daily.         . fish oil-omega-3 fatty acids 1000 MG capsule   Oral   Take 2 capsules (2 g total) by mouth 2 (two) times daily.         . Fluticasone-Salmeterol (ADVAIR DISKUS) 100-50 MCG/DOSE AEPB   Inhalation   Inhale 1 puff into the lungs every 4 (four) hours as needed (wheezing).          . furosemide (LASIX) 40 MG tablet   Oral   Take 40 mg by mouth daily.         . insulin aspart (NOVOLOG) 100 UNIT/ML injection   Continuous infusion (non-IV)   by Continuous infusion (non-IV) route daily. Insulin pump         . lansoprazole  (PREVACID SOLUTAB) 30 MG disintegrating tablet   Oral   Take 1 tablet (30 mg total) by mouth daily.   60 tablet   2   . levothyroxine (SYNTHROID, LEVOTHROID) 88 MCG tablet   Oral   Take 88 mcg by mouth daily.         . metoprolol tartrate (LOPRESSOR) 25 MG tablet   Oral   Take 12.5 mg by mouth 2 (two) times daily.         . rosuvastatin (CRESTOR) 10 MG tablet   Oral   Take 1 tablet (10 mg total) by mouth every evening.           Dose increased 08/22/2012 - already had 10mg  tabs at ...   . tiZANidine (ZANAFLEX) 4 MG tablet   Oral   Take 2-4 mg by mouth at bedtime as needed. For spasms         . EXPIRED: nitroGLYCERIN (NITROSTAT) 0.4 MG SL tablet   Sublingual   Place 1 tablet (0.4 mg total) under the tongue every 5 (five) minutes as needed for chest pain.   25 tablet   3   . oxyCODONE-acetaminophen (PERCOCET/ROXICET) 5-325 MG per tablet   Oral   Take 1 tablet by mouth every 4 (four) hours as needed for severe pain.   5 tablet   0    BP 125/55  Pulse 74  Temp(Src) 98 F (36.7 C) (Oral)  Resp 16  SpO2 97%  LMP 06/16/2013 Physical Exam CONSTITUTIONAL: Well developed/well nourished HEAD: Normocephalic/atraumatic EYES: EOMI/PERRL ENMT: Mucous membranes moist NECK: supple no meningeal signs SPINE:lower thoracic spinal tenderness, no other spinal tenderness, No bruising/crepitance/stepoffs noted to spine, thoracic paraspinal tenderness CV: S1/S2 noted, no murmurs/rubs/gallops noted Chest - mild tenderness to left chest wall, no bruising or crepitance LUNGS: Lungs are clear to auscultation bilaterally, no apparent distress ABDOMEN: soft, nontender, no rebound or guarding GU:no cva tenderness NEURO: Awake/alert,equal distal motor: hip flexion/knee flexion/extension, ankle dorsi/plantar flexion, great toe extension intact bilaterally, no clonus bilaterally, plantar reflex appropriate, no apparent sensory deficit in any dermatome.  Equal patellar/achilles reflex  noted.  Pt is able to ambulate. EXTREMITIES: pulses normal, full ROM SKIN: warm, color normal PSYCH: no abnormalities of mood noted     ED Course  Procedures (including critical care time) Labs Review  Labs Reviewed  COMPREHENSIVE METABOLIC PANEL - Abnormal; Notable for the following:    Glucose, Bld 198 (*)    Albumin 3.4 (*)    Alkaline Phosphatase 142 (*)    All other components within normal limits  CBC WITH DIFFERENTIAL  TROPONIN I   Imaging Review Dg Chest 2 View  06/16/2013   CLINICAL DATA:  BACK PAIN, shortness of breath  EXAM: CHEST  2 VIEW  COMPARISON:  DG CHEST 2 VIEW dated 08/21/2011; DG CHEST 2V dated 02/21/2010; DG CHEST 2V dated 10/05/2010; DG CHEST 2V dated 08/22/2012  FINDINGS: There is no focal parenchymal opacity, pleural effusion, or pneumothorax. The heart and mediastinal contours are unremarkable. Prior CABG.  The osseous structures are unremarkable.  IMPRESSION: No active cardiopulmonary disease.   Electronically Signed   By: Elige Ko   On: 06/16/2013 11:40    EKG Interpretation    Date/Time:  Monday June 16 2013 10:49:12 EST Ventricular Rate:  74 PR Interval:  182 QRS Duration: 86 QT Interval:  408 QTC Calculation: 452 R Axis:   78 Text Interpretation:  Normal sinus rhythm Nonspecific ST and T wave abnormality Abnormal ECG No significant change since last tracing Confirmed by Bebe Shaggy  MD, Maikayla Beggs [phone]) on 06/16/2013 4:08:32 PM            MDM   1. Chest pain   2. Back pain    Nursing notes including past medical history and social history reviewed and considered in documentation xrays reviewed and considered Labs/vital reviewed and considered   Pt given short course of pain meds for back pain (reports she can't tolerate NSAIDS) No signs of acute spinal/neurologic event.  I don't feel she requires emergent imaging For her CP, this has been constant for a month, her Chest is sore to palpation, I doubt ACS/PE/dissection at this  time     Joya Gaskins, MD 06/16/13 1623

## 2013-06-16 NOTE — ED Notes (Signed)
Pt reports lower back pain x couple of days. No known injury. Also reports knot to left side of chest. Hx of open heart surgery. States has tightening sensation to chest.

## 2013-06-16 NOTE — Discharge Instructions (Signed)
SEEK IMMEDIATE MEDICAL ATTENTION IF: °New numbness, tingling, weakness, or problem with the use of your arms or legs.  °Severe back pain not relieved with medications.  °Change in bowel or bladder control (if you lose control of stool or urine, or if you are unable to urinate) °Increasing pain in any areas of the body (such as chest or abdominal pain).  °Shortness of breath, dizziness or fainting.  °Nausea (feeling sick to your stomach), vomiting, fever, or sweats. ° ° °Your caregiver has diagnosed you as having chest pain that is not specific for one problem, but does not require admission.  Chest pain comes from many different causes.  °SEEK IMMEDIATE MEDICAL ATTENTION IF: °You have severe chest pain, especially if the pain is crushing or pressure-like and spreads to the arms, back, neck, or jaw, or if you have sweating, nausea (feeling sick to your stomach), or shortness of breath. THIS IS AN EMERGENCY. Don't wait to see if the pain will go away. Get medical help at once. Call 911 or 0 (operator). DO NOT drive yourself to the hospital.  °Your chest pain gets worse and does not go away with rest.  °You have an attack of chest pain lasting longer than usual, despite rest and treatment with the medications your caregiver has prescribed.  °You wake from sleep with chest pain or shortness of breath.  °You feel dizzy or faint.  °You have chest pain not typical of your usual pain for which you originally saw your caregiver. ° °

## 2013-06-23 ENCOUNTER — Other Ambulatory Visit: Payer: Self-pay | Admitting: Gastroenterology

## 2013-06-23 ENCOUNTER — Telehealth: Payer: Self-pay | Admitting: Gastroenterology

## 2013-06-23 MED ORDER — ALPRAZOLAM 0.5 MG PO TABS: 0.5000 mg | ORAL_TABLET | Freq: Two times a day (BID) | ORAL | Status: DC | PRN

## 2013-06-23 NOTE — Telephone Encounter (Signed)
Medication sent to pharmacy (Faxed)

## 2013-06-24 ENCOUNTER — Telehealth: Payer: Self-pay | Admitting: Gastroenterology

## 2013-06-24 ENCOUNTER — Other Ambulatory Visit: Payer: Self-pay | Admitting: Gastroenterology

## 2013-06-24 NOTE — Telephone Encounter (Signed)
Medication was faxed. But pharmacy states they never received it  So I called into the pharmacy

## 2013-07-20 ENCOUNTER — Other Ambulatory Visit: Payer: Self-pay | Admitting: Physician Assistant

## 2013-08-03 ENCOUNTER — Ambulatory Visit: Payer: Medicare Other

## 2013-08-03 ENCOUNTER — Ambulatory Visit (INDEPENDENT_AMBULATORY_CARE_PROVIDER_SITE_OTHER): Payer: Medicare Other | Admitting: Family Medicine

## 2013-08-03 VITALS — BP 110/62 | HR 57 | Temp 98.0°F | Resp 16 | Ht 62.0 in | Wt 219.0 lb

## 2013-08-03 DIAGNOSIS — S239XXA Sprain of unspecified parts of thorax, initial encounter: Secondary | ICD-10-CM

## 2013-08-03 DIAGNOSIS — M545 Low back pain, unspecified: Secondary | ICD-10-CM

## 2013-08-03 DIAGNOSIS — S29012A Strain of muscle and tendon of back wall of thorax, initial encounter: Secondary | ICD-10-CM

## 2013-08-03 DIAGNOSIS — E669 Obesity, unspecified: Secondary | ICD-10-CM | POA: Insufficient documentation

## 2013-08-03 MED ORDER — TIZANIDINE HCL 4 MG PO TABS: 2.0000 mg | ORAL_TABLET | Freq: Three times a day (TID) | ORAL | Status: DC | PRN

## 2013-08-03 NOTE — Patient Instructions (Signed)
Use the zanaflex as needed for your back pain.  Remember it can make you feel sleepy- do not take it when you need to drive.  Be especially cautious of taking this along with your xanax.  If you do take both together take just a 1/2 zanaflex

## 2013-08-03 NOTE — Progress Notes (Signed)
[facility] [address], [place] [address] [phone]  Date:  08/03/2013   Name:  [name]   DOB:  1965/06/05   MRN:  [id number]  PCP:  No PCP Per Patient    Chief Complaint: Back Pain   History of Present Illness:  [name] is a 48 y.o. very pleasant female patient who presents with the following:  History of multiple medical problems as below, here today as a new patient.  She has noted pain in her back with walking, sneezing or moving.  She has noted this issue for about a month, it seems to be getting worse.  NKI- no falls.  She has never had htis in the past.  However looking back she was in the ED at the end of January for back pain which is "kind of like" what she has today.  No chest pain now however.   She notes the pain in her right mid to lower back.  She has tried topical muscle rubs, tylenol.  She is having a hard time getting comfortable.   The pain does not radiate to her legs- it stays in her back.  At night the pain seems to go "all over" her back and she has a hard time sleeping.    She does have an insulin pump now.  She has been able to decrease her insulin use since starting the pump.  Her endocrinologist is Dr. Elizabeth Palau from [place].    Her LMP was less than a month ago.    She was given zanaflex from the ED in January and this did seem to be helpful for her.  She took it with her xanax and did not notice any problems with excessive sedation   Patient Active Problem List   Diagnosis Date Noted  . Hx of CABG   . Ejection fraction   . HTN (hypertension) 08/22/2012  . Hypothyroidism 09/29/2011  . Edema 08/25/2011  . Gastroparesis 09/07/2010  . DYSLIPIDEMIA 02/21/2010  . MUSCULOSKELETAL PAIN 04/29/2009  . PALPITATIONS 04/12/2009  . WHEEZING 04/12/2009  . GERD 01/19/2009  . DIABETES MELLITUS 06/12/2007  . ANXIETY 06/12/2007  . CORONARY ARTERY DISEASE 06/12/2007  . ASTHMA 06/12/2007    Past Medical  History  Diagnosis Date  . Anxiety   . Asthma   . Diabetes mellitus     Scheduled to go on insulin pump  . History of heart attack   . GERD (gastroesophageal reflux disease)   . Esophageal stricture   . CAD (coronary artery disease)     4v CABG, 9/10; NL LVF, status post followup Cardiolite November 2012 no ischemia ejection fraction 65%  . Dyslipidemia      LDL 22 mg percent on Crestor  . Hypothyroidism   . Hypertension   . Hx of CABG     September, 2010  . Ejection fraction     Past Surgical History  Procedure Laterality Date  . Angioplasty      with stenting   . Coronary artery bypass graft  01/26/2009  . Open heart surgery   2010  . Cardiac surgery    . Cesarean section      History  Substance Use Topics  . Smoking status: Former Smoker -- 1.00 packs/day for 3 years    Types: Cigarettes    Quit date: 05/22/1993  . Smokeless tobacco: Never Used     Comment:  Year Quit: 1995  . Alcohol Use: No  Family History  Problem Relation Age of Onset  . Colon cancer Neg Hx   . Heart disease Father   . Diabetes Mother     Allergies  Allergen Reactions  . Metformin Anaphylaxis  . Penicillins Anaphylaxis    Medication list has been reviewed and updated.  Current Outpatient Prescriptions on File Prior to Visit  Medication Sig Dispense Refill  . albuterol (PROVENTIL) (2.5 MG/3ML) 0.083% nebulizer solution Take 2.5 mg by nebulization every 4 (four) hours as needed. For wheezing.      Marland Kitchen. ALPRAZolam (XANAX) 0.5 MG tablet TAKE 1 TABLET BY MOUTH TWICE DAILY  60 tablet  2  . AMBULATORY NON FORMULARY MEDICATION Take 10 mg by mouth as needed (digestion). Medication Name: DOMPERIDONE 10MG  1 BY MOUTH 3 TIMES A DAY 30 MINUTES BEFORE MEALS AND AT BEDTIME      . aspirin 81 MG chewable tablet Chew 81 mg by mouth daily.      . CRESTOR 10 MG tablet TAKE 1/2 TABLET BY MOUTH EVERY EVENING  15 tablet  6  . fish oil-omega-3 fatty acids 1000 MG capsule Take 2 capsules (2 g total) by mouth 2  (two) times daily.      . Fluticasone-Salmeterol (ADVAIR DISKUS) 100-50 MCG/DOSE AEPB Inhale 1 puff into the lungs every 4 (four) hours as needed (wheezing).       . furosemide (LASIX) 40 MG tablet Take 40 mg by mouth daily.      . insulin aspart (NOVOLOG) 100 UNIT/ML injection by Continuous infusion (non-IV) route daily. Insulin pump      . levothyroxine (SYNTHROID, LEVOTHROID) 88 MCG tablet Take 88 mcg by mouth daily.      . metoprolol tartrate (LOPRESSOR) 25 MG tablet Take 12.5 mg by mouth 2 (two) times daily.      Marland Kitchen. PREVACID SOLUTAB 30 MG disintegrating tablet TAKE 1 TABLET (30 MG TOTAL) BY MOUTH DAILY.  60 tablet  2  . tiZANidine (ZANAFLEX) 4 MG tablet Take 2-4 mg by mouth at bedtime as needed. For spasms      . nitroGLYCERIN (NITROSTAT) 0.4 MG SL tablet Place 1 tablet (0.4 mg total) under the tongue every 5 (five) minutes as needed for chest pain.  25 tablet  3  . oxyCODONE-acetaminophen (PERCOCET/ROXICET) 5-325 MG per tablet Take 1 tablet by mouth every 4 (four) hours as needed for severe pain.  5 tablet  0   No current facility-administered medications on file prior to visit.    Review of Systems:  As per HPI- otherwise negative.   Physical Examination: Filed Vitals:   08/03/13 1330  BP: 110/62  Pulse: 57  Temp: 98 F (36.7 C)  Resp: 16   Filed Vitals:   08/03/13 1330  Height: 5\' 2"  (1.575 m)  Weight: 219 lb (99.338 kg)   Body mass index is 40.05 kg/(m^2). Ideal Body Weight: Weight in (lb) to have BMI = 25: 136.4  GEN: WDWN, NAD, Non-toxic, A & O x 3, obese, looks well HEENT: Atraumatic, Normocephalic. Neck supple. No masses, No LAD. Ears and Nose: No external deformity. CV: RRR, No M/G/R. No JVD. No thrill. No extra heart sounds. PULM: CTA B, no wheezes, crackles, rhonchi. No retractions. No resp. distress. No accessory muscle use. ABD: S, NT, ND EXTR: No c/c/e NEURO Normal gait.  PSYCH: Normally interactive. Conversant. Not depressed or anxious appearing.  Calm  demeanor.  She has reproducible pain in her right- sided right paraspinous muscles in the thoracic and lumbar areas. No bony pain.  Somewhat stiff with flexion.  Palpable muscle spasm No redness, heat, or pronounced scoliosis.    [facility] reading (PRIMARY) by  Dr. Patsy Lager. T spine: moderate degenerative change. S/P CABG L spine: minimal degenerative change  THORACIC SPINE - 2 VIEW  COMPARISON: None.  FINDINGS: There is no evidence of thoracic spine fracture. Alignment is normal. Mild vertebral osteophyte formation seen at several levels in the mid thoracic spine, without associated disc space narrowing. No focal lytic or sclerotic bone lesions identified. Prior CABG noted.  IMPRESSION: No acute findings.  Mild mid thoracic spine degenerative changes.  LUMBAR SPINE - COMPLETE 4+ VIEW  COMPARISON: None.  FINDINGS: There is no evidence of lumbar spine fracture. Alignment is normal. Intervertebral disc spaces are maintained. Mild vertebral osteophyte formation is seen at several levels. No other significant bone abnormality identified. Mild atherosclerotic calcification of abdominal aorta also noted.  IMPRESSION: No acute findings.  Early lumbar spondylosis, as described above.   Assessment and Plan: Strain of thoracic spine - Plan: DG Lumbar Spine Complete, tiZANidine (ZANAFLEX) 4 MG tablet, Ambulatory referral to Physical Therapy  Lower back pain - Plan: DG Thoracic Spine 2 View  Back spasm and pain.  Refilled zanaflex which was helpful for her in the past   Will refer to PT for her back as she has now had sx for several weeks  See patient instructions for more details.     Signed Abbe Amsterdam, MD

## 2013-08-18 ENCOUNTER — Other Ambulatory Visit: Payer: Self-pay | Admitting: Orthopedic Surgery

## 2013-08-18 DIAGNOSIS — M545 Low back pain, unspecified: Secondary | ICD-10-CM

## 2013-08-26 ENCOUNTER — Ambulatory Visit
Admission: RE | Admit: 2013-08-26 | Discharge: 2013-08-26 | Disposition: A | Discharge status: Home / Self Care | Payer: Medicare Other | Source: Ambulatory Visit | Attending: Orthopedic Surgery | Admitting: Orthopedic Surgery

## 2013-08-26 VITALS — BP 131/65 | HR 82

## 2013-08-26 DIAGNOSIS — M545 Low back pain, unspecified: Secondary | ICD-10-CM

## 2013-08-26 MED ORDER — DIAZEPAM 5 MG PO TABS
10.0000 mg | ORAL_TABLET | Freq: Once | ORAL | Status: AC
  Administered 2013-08-26: 10 mg via ORAL

## 2013-08-26 MED ORDER — IOHEXOL 180 MG/ML  SOLN
15.0000 mL | Freq: Once | INTRAMUSCULAR | Status: AC | PRN
  Administered 2013-08-26: 15 mL via INTRATHECAL

## 2013-08-26 NOTE — Progress Notes (Signed)
Dr. Carlota RaspberryLamke in to talk to pt and explain myelo to her. Discharge instructions already explained to pt.

## 2013-08-26 NOTE — Discharge Instructions (Signed)

## 2013-09-05 ENCOUNTER — Emergency Department (HOSPITAL_COMMUNITY)
Admission: EM | Admit: 2013-09-05 | Discharge: 2013-09-05 | Disposition: A | Discharge status: Home / Self Care | Payer: Medicare Other | Attending: Emergency Medicine | Admitting: Emergency Medicine

## 2013-09-05 ENCOUNTER — Encounter (HOSPITAL_COMMUNITY): Payer: Self-pay | Admitting: Emergency Medicine

## 2013-09-05 DIAGNOSIS — J029 Acute pharyngitis, unspecified: Secondary | ICD-10-CM | POA: Insufficient documentation

## 2013-09-05 DIAGNOSIS — Z87891 Personal history of nicotine dependence: Secondary | ICD-10-CM | POA: Insufficient documentation

## 2013-09-05 DIAGNOSIS — Z7982 Long term (current) use of aspirin: Secondary | ICD-10-CM | POA: Insufficient documentation

## 2013-09-05 DIAGNOSIS — J45909 Unspecified asthma, uncomplicated: Secondary | ICD-10-CM | POA: Insufficient documentation

## 2013-09-05 DIAGNOSIS — I2510 Atherosclerotic heart disease of native coronary artery without angina pectoris: Secondary | ICD-10-CM | POA: Insufficient documentation

## 2013-09-05 DIAGNOSIS — Z794 Long term (current) use of insulin: Secondary | ICD-10-CM | POA: Insufficient documentation

## 2013-09-05 DIAGNOSIS — E119 Type 2 diabetes mellitus without complications: Secondary | ICD-10-CM | POA: Insufficient documentation

## 2013-09-05 DIAGNOSIS — Z880 Allergy status to penicillin: Secondary | ICD-10-CM | POA: Insufficient documentation

## 2013-09-05 DIAGNOSIS — F411 Generalized anxiety disorder: Secondary | ICD-10-CM | POA: Insufficient documentation

## 2013-09-05 DIAGNOSIS — Z79899 Other long term (current) drug therapy: Secondary | ICD-10-CM | POA: Insufficient documentation

## 2013-09-05 DIAGNOSIS — Z951 Presence of aortocoronary bypass graft: Secondary | ICD-10-CM | POA: Insufficient documentation

## 2013-09-05 DIAGNOSIS — H6691 Otitis media, unspecified, right ear: Secondary | ICD-10-CM

## 2013-09-05 DIAGNOSIS — I10 Essential (primary) hypertension: Secondary | ICD-10-CM | POA: Insufficient documentation

## 2013-09-05 DIAGNOSIS — H6690 Otitis media, unspecified, unspecified ear: Secondary | ICD-10-CM | POA: Insufficient documentation

## 2013-09-05 DIAGNOSIS — Z8719 Personal history of other diseases of the digestive system: Secondary | ICD-10-CM | POA: Insufficient documentation

## 2013-09-05 DIAGNOSIS — E039 Hypothyroidism, unspecified: Secondary | ICD-10-CM | POA: Insufficient documentation

## 2013-09-05 DIAGNOSIS — Z9861 Coronary angioplasty status: Secondary | ICD-10-CM | POA: Insufficient documentation

## 2013-09-05 DIAGNOSIS — E785 Hyperlipidemia, unspecified: Secondary | ICD-10-CM | POA: Insufficient documentation

## 2013-09-05 LAB — RAPID STREP SCREEN (MED CTR MEBANE ONLY): STREPTOCOCCUS, GROUP A SCREEN (DIRECT): NEGATIVE

## 2013-09-05 MED ORDER — AZITHROMYCIN 200 MG/5ML PO SUSR: 250.0000 mg | Freq: Every day | ORAL | Status: DC

## 2013-09-05 MED ORDER — IBUPROFEN 100 MG/5ML PO SUSP: 600.0000 mg | ORAL | Status: DC | PRN

## 2013-09-05 NOTE — Progress Notes (Signed)
  CARE MANAGEMENT ED NOTE 09/05/2013  Patient:  [name],[name]   Account Number:  [id number]  Date Initiated:  09/05/2013  Documentation initiated by:  Edd ArbourGIBBS,Patti Shorb  Subjective/Objective Assessment:   48 [place] pain, right sided intermittent headache just behind ear, and sore throat.     Subjective/Objective Assessment Detail:   NO pcp listed pt confirms pcp as Elizabeth Palauteresa Anderson     Action/Plan:   EPIC updated   Action/Plan Detail:   Anticipated DC Date:  09/05/2013     Status Recommendation to Physician:   Result of Recommendation:    Other ED Services  Consult Working Plan    DC Planning Services  Other  Outpatient Services - Pt will follow up  PCP issues    Choice offered to / List presented to:            Status of service:  Completed, signed off  ED Comments:   ED Comments Detail:

## 2013-09-05 NOTE — Discharge Instructions (Signed)
Take azithromycin as directed until gone. Take ibuprofen as needed for pain. Refer to attached documents for more information. Follow up with your doctor for further evaluation and management.

## 2013-09-05 NOTE — ED Provider Notes (Signed)
CSN: [id number]     Arrival date & time 09/05/13  1130 History  This chart was scribed for non-physician practitioner, Emilia BeckKaitlyn Kortne All, PA-C, working with Doug SouSam Jacubowitz, MD by Charline BillsEssence Howell, ED Scribe. This patient was seen in room WTR9/WTR9 and the patient's care was started at 12:36 PM.   Chief Complaint  Patient presents with  . Otalgia  . Sore Throat    Patient is a 48 y.o. female presenting with ear pain and pharyngitis. The history is provided by the patient. No language interpreter was used.  Otalgia Location:  Right Behind ear:  No abnormality Quality:  Aching Severity:  Moderate Onset quality:  Sudden Timing:  Intermittent Chronicity:  New Relieved by:  Nothing Worsened by:  Nothing tried Associated symptoms: sore throat   Associated symptoms: no fever   Sore Throat   HPI Comments: [name] is a 48 y.o. female who presents to the Emergency Department complaining of intermittent R ear pain onset 3 days ago. She further describes this pain as "aching" located just behind her ear. Pt reports an associated sore throat onset the next morning. She also reports associated hoarseness and pain with swallowing. She denies fever. Pt has tried 2 dosages of children's Sudafed and Tylenol with no relief.    Past Medical History  Diagnosis Date  . Anxiety   . Asthma   . Diabetes mellitus     Scheduled to go on insulin pump  . History of heart attack   . GERD (gastroesophageal reflux disease)   . Esophageal stricture   . CAD (coronary artery disease)     4v CABG, 9/10; NL LVF, status post followup Cardiolite November 2012 no ischemia ejection fraction 65%  . Dyslipidemia      LDL 22 mg percent on Crestor  . Hypothyroidism   . Hypertension   . Hx of CABG     September, 2010  . Ejection fraction    Past Surgical History  Procedure Laterality Date  . Angioplasty      with stenting   . Coronary artery bypass graft  01/26/2009  . Open heart surgery   2010  . Cardiac  surgery    . Cesarean section     Family History  Problem Relation Age of Onset  . Colon cancer Neg Hx   . Heart disease Father   . Diabetes Mother    History  Substance Use Topics  . Smoking status: Former Smoker -- 1.00 packs/day for 3 years    Types: Cigarettes    Quit date: 05/22/1993  . Smokeless tobacco: Never Used     Comment:  Year Quit: 1995  . Alcohol Use: No   OB History   Grav Para Term Preterm Abortions TAB SAB Ect Mult Living                 Review of Systems  Constitutional: Negative for fever.  HENT: Positive for ear pain, sore throat and trouble swallowing.   All other systems reviewed and are negative.   Allergies  Metformin and Penicillins  Home Medications   Prior to Admission medications   Medication Sig Start Date End Date Taking? Authorizing Provider  albuterol (PROVENTIL) (2.5 MG/3ML) 0.083% nebulizer solution Take 2.5 mg by nebulization every 4 (four) hours as needed. For wheezing.   Yes Historical Provider, MD  ALPRAZolam Prudy Feeler(XANAX) 0.5 MG tablet TAKE 1 TABLET BY MOUTH TWICE DAILY 06/23/13  Yes Louis Meckelobert D Kaplan, MD  aspirin 81 MG chewable tablet Chew 81  mg by mouth daily.   Yes Historical Provider, MD  CRESTOR 10 MG tablet TAKE 1/2 TABLET BY MOUTH EVERY EVENING   Yes Luis AbedJeffrey D Katz, MD  fish oil-omega-3 fatty acids 1000 MG capsule Take 2 capsules (2 g total) by mouth 2 (two) times daily. 07/03/12  Yes Rande BruntEugene C Serpe, PA-C  Fluticasone-Salmeterol (ADVAIR DISKUS) 100-50 MCG/DOSE AEPB Inhale 1 puff into the lungs every 4 (four) hours as needed (wheezing).    Yes Historical Provider, MD  furosemide (LASIX) 40 MG tablet Take 40 mg by mouth daily.   Yes Historical Provider, MD  insulin aspart (NOVOLOG) 100 UNIT/ML injection by Continuous infusion (non-IV) route daily. Insulin pump   Yes Historical Provider, MD  levothyroxine (SYNTHROID, LEVOTHROID) 100 MCG tablet Take 100 mcg by mouth daily before breakfast.   Yes Historical Provider, MD  metoprolol tartrate  (LOPRESSOR) 25 MG tablet Take 12.5 mg by mouth 2 (two) times daily.   Yes Historical Provider, MD  nitroGLYCERIN (NITROSTAT) 0.4 MG SL tablet Place 0.4 mg under the tongue every 5 (five) minutes as needed for chest pain.   Yes Historical Provider, MD  PREVACID SOLUTAB 30 MG disintegrating tablet TAKE 1 TABLET (30 MG TOTAL) BY MOUTH DAILY. 06/23/13  Yes Louis Meckelobert D Kaplan, MD  tiZANidine (ZANAFLEX) 4 MG tablet Take 0.5-1 tablets (2-4 mg total) by mouth every 8 (eight) hours as needed. For spasms 08/03/13  Yes Gwenlyn FoundJessica C Copland, MD   Triage Vitals: BP 140/79  Pulse 65  Temp(Src) 98.2 F (36.8 C)  Resp 18  SpO2 100% Physical Exam  Nursing note and vitals reviewed. Constitutional: She is oriented to person, place, and time. She appears well-developed and well-nourished. No distress.  HENT:  Head: Normocephalic and atraumatic.  Right Ear: Tympanic membrane is not erythematous.  Left Ear: Tympanic membrane is not erythematous.  R mastoid tenderness to palpation Both TMs intact No sinus tenderness upon palpation   Eyes: EOM are normal.  Neck: Neck supple.  Cardiovascular: Normal rate.   Pulmonary/Chest: Effort normal. No respiratory distress.  Musculoskeletal: Normal range of motion.  Neurological: She is alert and oriented to person, place, and time.  Skin: Skin is warm and dry.  Psychiatric: She has a normal mood and affect. Her behavior is normal.    ED Course  Procedures (including critical care time) DIAGNOSTIC STUDIES: Oxygen Saturation is 100% on RA, normal by my interpretation.    COORDINATION OF CARE: 12:40 PM-Discussed treatment plan which includes antibiotic and follow-up with PCP with pt at bedside and pt agreed to plan.   Labs Review Labs Reviewed  RAPID STREP SCREEN  CULTURE, GROUP A STREP   Imaging Review No results found.   EKG Interpretation None      MDM   Final diagnoses:  Otitis media of right ear    Patient's rapid strep is negative. Patient will be  treated for otitis media of the right ear with azithromycin. Vitals stable and patient afebrile.   I personally performed the services described in this documentation, which was scribed in my presence. The recorded information has been reviewed and is accurate.    Emilia BeckKaitlyn Chellie Vanlue, [place]PA-C 09/05/13 1822

## 2013-09-05 NOTE — ED Notes (Addendum)
Patient reports to ED with right ear pain, right sided intermittent headache just behind ear, and sore throat.

## 2013-09-06 NOTE — ED Provider Notes (Signed)
Medical screening examination/treatment/procedure(s) were performed by non-physician practitioner and as supervising physician I was immediately available for consultation/collaboration.   EKG Interpretation None       Doug SouSam Janaiyah Blackard, MD 09/06/13 [phone]

## 2013-09-07 LAB — CULTURE, GROUP A STREP

## 2013-09-17 ENCOUNTER — Encounter: Payer: Self-pay | Admitting: Cardiology

## 2013-09-17 ENCOUNTER — Telehealth: Payer: Self-pay | Admitting: *Deleted

## 2013-09-17 ENCOUNTER — Ambulatory Visit (INDEPENDENT_AMBULATORY_CARE_PROVIDER_SITE_OTHER): Payer: Medicare Other | Admitting: Cardiology

## 2013-09-17 VITALS — BP 110/66 | HR 69 | Ht 62.0 in | Wt 216.0 lb

## 2013-09-17 DIAGNOSIS — I2510 Atherosclerotic heart disease of native coronary artery without angina pectoris: Secondary | ICD-10-CM

## 2013-09-17 DIAGNOSIS — I10 Essential (primary) hypertension: Secondary | ICD-10-CM

## 2013-09-17 DIAGNOSIS — R609 Edema, unspecified: Secondary | ICD-10-CM

## 2013-09-17 DIAGNOSIS — E785 Hyperlipidemia, unspecified: Secondary | ICD-10-CM

## 2013-09-17 DIAGNOSIS — E669 Obesity, unspecified: Secondary | ICD-10-CM

## 2013-09-17 DIAGNOSIS — R002 Palpitations: Secondary | ICD-10-CM

## 2013-09-17 MED ORDER — ALPRAZOLAM 0.5 MG PO TABS: ORAL_TABLET | ORAL | Status: DC

## 2013-09-17 NOTE — Patient Instructions (Signed)

## 2013-09-17 NOTE — Assessment & Plan Note (Signed)
She is not having any significant palpitations. No change in therapy. 

## 2013-09-17 NOTE — Telephone Encounter (Signed)
Patient called needed her script of Xanax Dr Arlyce DiceKaplan I sent this in for her Just wanted to make sure your aware

## 2013-09-17 NOTE — Assessment & Plan Note (Signed)
Coronary disease is stable. No change in therapy. 

## 2013-09-17 NOTE — Assessment & Plan Note (Signed)
I encouraged her to increase her walking exercise.

## 2013-09-17 NOTE — Progress Notes (Signed)
Patient ID: [name], female   DOB: [birth year], 48 y.o.   MRN: [id number]    HPI  Patient is seen today to followup for coronary disease. She is a young woman who underwent bypass surgery in the past. She had repeat catheterization in April, 2014. Her grafts were patent. Her ejection fraction was 55%. She does have GERD. She tells me that her recent cholesterol was checked. She says that her bad cholesterol was okay but her triglycerides were 250. She is not smoking. Her husband smokes but goes outside.  Allergies  Allergen Reactions  . Metformin Anaphylaxis  . Penicillins Anaphylaxis    Current Outpatient Prescriptions  Medication Sig Dispense Refill  . albuterol (PROVENTIL) (2.5 MG/3ML) 0.083% nebulizer solution Take 2.5 mg by nebulization every 4 (four) hours as needed. For wheezing.      Marland Kitchen. ALPRAZolam (XANAX) 0.5 MG tablet TAKE 1 TABLET BY MOUTH TWICE DAILY  60 tablet  2  . aspirin 81 MG chewable tablet Chew 81 mg by mouth daily.      Marland Kitchen. azithromycin (ZITHROMAX) 200 MG/5ML suspension Take 6.3 mLs (250 mg total) by mouth daily.  30 mL  0  . CRESTOR 10 MG tablet TAKE 1/2 TABLET BY MOUTH EVERY EVENING  15 tablet  6  . fish oil-omega-3 fatty acids 1000 MG capsule Take 2 capsules (2 g total) by mouth 2 (two) times daily.      . Fluticasone-Salmeterol (ADVAIR DISKUS) 100-50 MCG/DOSE AEPB Inhale 1 puff into the lungs every 4 (four) hours as needed (wheezing).       . furosemide (LASIX) 40 MG tablet Take 40 mg by mouth daily.      Marland Kitchen. ibuprofen (CHILDRENS IBUPROFEN) 100 MG/5ML suspension Take 30 mLs (600 mg total) by mouth every 4 (four) hours as needed.  473 mL  0  . insulin aspart (NOVOLOG) 100 UNIT/ML injection by Continuous infusion (non-IV) route daily. Insulin pump      . levothyroxine (SYNTHROID, LEVOTHROID) 100 MCG tablet Take 100 mcg by mouth daily before breakfast.      . metoprolol tartrate (LOPRESSOR) 25 MG tablet Take 12.5 mg by mouth 2 (two) times daily.      . nitroGLYCERIN  (NITROSTAT) 0.4 MG SL tablet Place 0.4 mg under the tongue every 5 (five) minutes as needed for chest pain.      Marland Kitchen. PREVACID SOLUTAB 30 MG disintegrating tablet TAKE 1 TABLET (30 MG TOTAL) BY MOUTH DAILY.  60 tablet  2  . tiZANidine (ZANAFLEX) 4 MG tablet Take 0.5-1 tablets (2-4 mg total) by mouth every 8 (eight) hours as needed. For spasms  40 tablet  0   No current facility-administered medications for this visit.    History   Social History  . Marital Status: Single    Spouse Name: N/A    Number of Children: 1  . Years of Education: N/A   Occupational History  . disabilied    Social History Main Topics  . Smoking status: Former Smoker -- 1.00 packs/day for 3 years    Types: Cigarettes    Quit date: 05/22/1993  . Smokeless tobacco: Never Used     Comment:  Year Quit: 1995  . Alcohol Use: No  . Drug Use: No  . Sexual Activity: Yes   Other Topics Concern  . Not on file   Social History Narrative  . No narrative on file    Family History  Problem Relation Age of Onset  . Colon cancer Neg Hx   .  Heart disease Father   . Diabetes Mother     Past Medical History  Diagnosis Date  . Anxiety   . Asthma   . Diabetes mellitus     Scheduled to go on insulin pump  . History of heart attack   . GERD (gastroesophageal reflux disease)   . Esophageal stricture   . CAD (coronary artery disease)     4v CABG, 9/10; NL LVF, status post followup Cardiolite November 2012 no ischemia ejection fraction 65%  . Dyslipidemia      LDL 22 mg percent on Crestor  . Hypothyroidism   . Hypertension   . Hx of CABG     September, 2010  . Ejection fraction     Past Surgical History  Procedure Laterality Date  . Angioplasty      with stenting   . Coronary artery bypass graft  01/26/2009  . Open heart surgery   2010  . Cardiac surgery    . Cesarean section      Patient Active Problem List   Diagnosis Date Noted  . Obesity, unspecified 08/03/2013  . Hx of CABG   . Ejection fraction    . HTN (hypertension) 08/22/2012  . Hypothyroidism 09/29/2011  . Edema 08/25/2011  . Gastroparesis 09/07/2010  . DYSLIPIDEMIA 02/21/2010  . MUSCULOSKELETAL PAIN 04/29/2009  . PALPITATIONS 04/12/2009  . WHEEZING 04/12/2009  . GERD 01/19/2009  . DIABETES MELLITUS 06/12/2007  . ANXIETY 06/12/2007  . CORONARY ARTERY DISEASE 06/12/2007  . ASTHMA 06/12/2007    ROS   Patient denies fever, chills, headache, sweats, rash, change in vision, change in hearing, chest pain, cough, nausea vomiting, urinary symptoms. All other systems are reviewed and are negative.  PHYSICAL EXAM  Patient is overweight. She is oriented to person time and place. Affect is normal. She is here with her husband. Head is atraumatic. Sclera and conjunctiva are normal. There is no jugulovenous distention. Lungs are clear. Respiratory effort is not labored. Cardiac exam reveals S1 and S2. The abdomen is soft. She is no peripheral edema at this time. There no musculoskeletal deformities. There are no skin rashes.  Filed Vitals:   09/17/13 1420  BP: 110/66  Pulse: 69  Height: 5\' 2"  (1.575 m)  Weight: 216 lb (97.977 kg)  SpO2: 97%     ASSESSMENT & PLAN

## 2013-09-17 NOTE — Assessment & Plan Note (Signed)
The patient has some mild edema when she has been standing for long periods of time. I suspect this is from venous insufficiency. There is no sign of total body volume overload. I encouraged her to be careful with her salt intake and to elevate her feet whenever she can.

## 2013-09-17 NOTE — Assessment & Plan Note (Signed)
She is on Crestor 10. I would like for her to be on Crestor 20. She mentions that her triglyceride is in the range of 250. I encouraged her to lose weight and to cut back on her carbohydrates.

## 2013-09-17 NOTE — Assessment & Plan Note (Signed)
Blood pressure is controlled. No change in therapy. 

## 2013-09-17 NOTE — Telephone Encounter (Signed)
ok 

## 2013-09-18 ENCOUNTER — Ambulatory Visit (INDEPENDENT_AMBULATORY_CARE_PROVIDER_SITE_OTHER): Payer: Medicare Other | Admitting: Ophthalmology

## 2013-09-18 DIAGNOSIS — H43819 Vitreous degeneration, unspecified eye: Secondary | ICD-10-CM

## 2013-09-18 DIAGNOSIS — I10 Essential (primary) hypertension: Secondary | ICD-10-CM

## 2013-09-18 DIAGNOSIS — E11319 Type 2 diabetes mellitus with unspecified diabetic retinopathy without macular edema: Secondary | ICD-10-CM

## 2013-09-18 DIAGNOSIS — H35039 Hypertensive retinopathy, unspecified eye: Secondary | ICD-10-CM

## 2013-09-18 DIAGNOSIS — E1165 Type 2 diabetes mellitus with hyperglycemia: Secondary | ICD-10-CM

## 2013-09-18 DIAGNOSIS — E1139 Type 2 diabetes mellitus with other diabetic ophthalmic complication: Secondary | ICD-10-CM

## 2013-09-18 DIAGNOSIS — H2510 Age-related nuclear cataract, unspecified eye: Secondary | ICD-10-CM

## 2013-09-18 DIAGNOSIS — E11359 Type 2 diabetes mellitus with proliferative diabetic retinopathy without macular edema: Secondary | ICD-10-CM

## 2013-11-19 ENCOUNTER — Other Ambulatory Visit: Payer: Self-pay | Admitting: *Deleted

## 2013-11-19 MED ORDER — FUROSEMIDE 40 MG PO TABS: 40.0000 mg | ORAL_TABLET | Freq: Every day | ORAL | Status: DC

## 2013-12-18 ENCOUNTER — Telehealth: Payer: Self-pay | Admitting: Gastroenterology

## 2013-12-18 ENCOUNTER — Other Ambulatory Visit: Payer: Self-pay | Admitting: Gastroenterology

## 2013-12-18 NOTE — Telephone Encounter (Signed)
Medication sent to pharmacy  

## 2013-12-23 ENCOUNTER — Other Ambulatory Visit: Payer: Self-pay | Admitting: *Deleted

## 2013-12-23 MED ORDER — METOPROLOL TARTRATE 25 MG PO TABS: 12.5000 mg | ORAL_TABLET | Freq: Two times a day (BID) | ORAL | Status: DC

## 2014-01-22 ENCOUNTER — Other Ambulatory Visit: Payer: Self-pay | Admitting: Gastroenterology

## 2014-02-09 ENCOUNTER — Encounter (HOSPITAL_COMMUNITY): Payer: Self-pay | Admitting: Emergency Medicine

## 2014-02-09 ENCOUNTER — Emergency Department (HOSPITAL_COMMUNITY): Payer: Medicare Other

## 2014-02-09 ENCOUNTER — Emergency Department (HOSPITAL_COMMUNITY)
Admission: EM | Admit: 2014-02-09 | Discharge: 2014-02-09 | Disposition: A | Discharge status: Home / Self Care | Payer: Medicare Other | Attending: Emergency Medicine | Admitting: Emergency Medicine

## 2014-02-09 DIAGNOSIS — F411 Generalized anxiety disorder: Secondary | ICD-10-CM | POA: Diagnosis not present

## 2014-02-09 DIAGNOSIS — I2510 Atherosclerotic heart disease of native coronary artery without angina pectoris: Secondary | ICD-10-CM | POA: Insufficient documentation

## 2014-02-09 DIAGNOSIS — J45909 Unspecified asthma, uncomplicated: Secondary | ICD-10-CM | POA: Diagnosis not present

## 2014-02-09 DIAGNOSIS — Z79899 Other long term (current) drug therapy: Secondary | ICD-10-CM | POA: Diagnosis not present

## 2014-02-09 DIAGNOSIS — E039 Hypothyroidism, unspecified: Secondary | ICD-10-CM | POA: Insufficient documentation

## 2014-02-09 DIAGNOSIS — Z794 Long term (current) use of insulin: Secondary | ICD-10-CM | POA: Insufficient documentation

## 2014-02-09 DIAGNOSIS — Z880 Allergy status to penicillin: Secondary | ICD-10-CM | POA: Diagnosis not present

## 2014-02-09 DIAGNOSIS — M79609 Pain in unspecified limb: Secondary | ICD-10-CM | POA: Diagnosis present

## 2014-02-09 DIAGNOSIS — Z792 Long term (current) use of antibiotics: Secondary | ICD-10-CM | POA: Diagnosis not present

## 2014-02-09 DIAGNOSIS — I252 Old myocardial infarction: Secondary | ICD-10-CM | POA: Insufficient documentation

## 2014-02-09 DIAGNOSIS — M722 Plantar fascial fibromatosis: Secondary | ICD-10-CM

## 2014-02-09 DIAGNOSIS — Z7982 Long term (current) use of aspirin: Secondary | ICD-10-CM | POA: Diagnosis not present

## 2014-02-09 DIAGNOSIS — M7730 Calcaneal spur, unspecified foot: Secondary | ICD-10-CM | POA: Diagnosis not present

## 2014-02-09 DIAGNOSIS — I10 Essential (primary) hypertension: Secondary | ICD-10-CM | POA: Insufficient documentation

## 2014-02-09 DIAGNOSIS — Z87891 Personal history of nicotine dependence: Secondary | ICD-10-CM | POA: Insufficient documentation

## 2014-02-09 DIAGNOSIS — E785 Hyperlipidemia, unspecified: Secondary | ICD-10-CM | POA: Diagnosis not present

## 2014-02-09 DIAGNOSIS — K219 Gastro-esophageal reflux disease without esophagitis: Secondary | ICD-10-CM | POA: Diagnosis not present

## 2014-02-09 DIAGNOSIS — M7731 Calcaneal spur, right foot: Secondary | ICD-10-CM

## 2014-02-09 DIAGNOSIS — Z951 Presence of aortocoronary bypass graft: Secondary | ICD-10-CM | POA: Insufficient documentation

## 2014-02-09 LAB — CBG MONITORING, ED: GLUCOSE-CAPILLARY: 228 mg/dL — AB (ref 70–99)

## 2014-02-09 NOTE — ED Notes (Signed)
Pt c/o right heel pain x 3 weeks. denies injury.

## 2014-02-09 NOTE — Discharge Instructions (Signed)
Plantar Fasciitis (Heel Spur Syndrome) with Rehab The plantar fascia is a fibrous, ligament-like, soft-tissue structure that spans the bottom of the foot. Plantar fasciitis is a condition that causes pain in the foot due to inflammation of the tissue. SYMPTOMS   Pain and tenderness on the underneath side of the foot.  Pain that worsens with standing or walking. CAUSES  Plantar fasciitis is caused by irritation and injury to the plantar fascia on the underneath side of the foot. Common mechanisms of injury include:  Direct trauma to bottom of the foot.  Damage to a small nerve that runs under the foot where the main fascia attaches to the heel bone.  Stress placed on the plantar fascia due to bone spurs. RISK INCREASES WITH:   Activities that place stress on the plantar fascia (running, jumping, pivoting, or cutting).  Poor strength and flexibility.  Improperly fitted shoes.  Tight calf muscles.  Flat feet.  Failure to warm-up properly before activity.  Obesity. PREVENTION  Warm up and stretch properly before activity.  Allow for adequate recovery between workouts.  Maintain physical fitness:  Strength, flexibility, and endurance.  Cardiovascular fitness.  Maintain a health body weight.  Avoid stress on the plantar fascia.  Wear properly fitted shoes, including arch supports for individuals who have flat feet. PROGNOSIS  If treated properly, then the symptoms of plantar fasciitis usually resolve without surgery. However, occasionally surgery is necessary. RELATED COMPLICATIONS   Recurrent symptoms that may result in a chronic condition.  Problems of the lower back that are caused by compensating for the injury, such as limping.  Pain or weakness of the foot during push-off following surgery.  Chronic inflammation, scarring, and partial or complete fascia tear, occurring more often from repeated injections. TREATMENT  Treatment initially involves the use of  ice and medication to help reduce pain and inflammation. The use of strengthening and stretching exercises may help reduce pain with activity, especially stretches of the Achilles tendon. These exercises may be performed at home or with a therapist. Your caregiver may recommend that you use heel cups of arch supports to help reduce stress on the plantar fascia. Occasionally, corticosteroid injections are given to reduce inflammation. If symptoms persist for greater than 6 months despite non-surgical (conservative), then surgery may be recommended.  MEDICATION   If pain medication is necessary, then nonsteroidal anti-inflammatory medications, such as aspirin and ibuprofen, or other minor pain relievers, such as acetaminophen, are often recommended.  Do not take pain medication within 7 days before surgery.  Prescription pain relievers may be given if deemed necessary by your caregiver. Use only as directed and only as much as you need.  Corticosteroid injections may be given by your caregiver. These injections should be reserved for the most serious cases, because they may only be given a certain number of times. HEAT AND COLD  Cold treatment (icing) relieves pain and reduces inflammation. Cold treatment should be applied for 10 to 15 minutes every 2 to 3 hours for inflammation and pain and immediately after any activity that aggravates your symptoms. Use ice packs or massage the area with a piece of ice (ice massage).  Heat treatment may be used prior to performing the stretching and strengthening activities prescribed by your caregiver, physical therapist, or athletic trainer. Use a heat pack or soak the injury in warm water. SEEK IMMEDIATE MEDICAL CARE IF:  Treatment seems to offer no benefit, or the condition worsens.  Any medications produce adverse side effects. EXERCISES RANGE   OF MOTION (ROM) AND STRETCHING EXERCISES - Plantar Fasciitis (Heel Spur Syndrome) These exercises may help you  when beginning to rehabilitate your injury. Your symptoms may resolve with or without further involvement from your physician, physical therapist or athletic trainer. While completing these exercises, remember:   Restoring tissue flexibility helps normal motion to return to the joints. This allows healthier, less painful movement and activity.  An effective stretch should be held for at least 30 seconds.  A stretch should never be painful. You should only feel a gentle lengthening or release in the stretched tissue. RANGE OF MOTION - Toe Extension, Flexion  Sit with your right / left leg crossed over your opposite knee.  Grasp your toes and gently pull them back toward the top of your foot. You should feel a stretch on the bottom of your toes and/or foot.  Hold this stretch for __________ seconds.  Now, gently pull your toes toward the bottom of your foot. You should feel a stretch on the top of your toes and or foot.  Hold this stretch for __________ seconds. Repeat __________ times. Complete this stretch __________ times per day.  RANGE OF MOTION - Ankle Dorsiflexion, Active Assisted  Remove shoes and sit on a chair that is preferably not on a carpeted surface.  Place right / left foot under knee. Extend your opposite leg for support.  Keeping your heel down, slide your right / left foot back toward the chair until you feel a stretch at your ankle or calf. If you do not feel a stretch, slide your bottom forward to the edge of the chair, while still keeping your heel down.  Hold this stretch for __________ seconds. Repeat __________ times. Complete this stretch __________ times per day.  STRETCH - Gastroc, Standing  Place hands on wall.  Extend right / left leg, keeping the front knee somewhat bent.  Slightly point your toes inward on your back foot.  Keeping your right / left heel on the floor and your knee straight, shift your weight toward the wall, not allowing your back to  arch.  You should feel a gentle stretch in the right / left calf. Hold this position for __________ seconds. Repeat __________ times. Complete this stretch __________ times per day. STRETCH - Soleus, Standing  Place hands on wall.  Extend right / left leg, keeping the other knee somewhat bent.  Slightly point your toes inward on your back foot.  Keep your right / left heel on the floor, bend your back knee, and slightly shift your weight over the back leg so that you feel a gentle stretch deep in your back calf.  Hold this position for __________ seconds. Repeat __________ times. Complete this stretch __________ times per day. STRETCH - Gastrocsoleus, Standing  Note: This exercise can place a lot of stress on your foot and ankle. Please complete this exercise only if specifically instructed by your caregiver.   Place the ball of your right / left foot on a step, keeping your other foot firmly on the same step.  Hold on to the wall or a rail for balance.  Slowly lift your other foot, allowing your body weight to press your heel down over the edge of the step.  You should feel a stretch in your right / left calf.  Hold this position for __________ seconds.  Repeat this exercise with a slight bend in your right / left knee. Repeat __________ times. Complete this stretch __________ times per day.    STRENGTHENING EXERCISES - Plantar Fasciitis (Heel Spur Syndrome)  These exercises may help you when beginning to rehabilitate your injury. They may resolve your symptoms with or without further involvement from your physician, physical therapist or athletic trainer. While completing these exercises, remember:   Muscles can gain both the endurance and the strength needed for everyday activities through controlled exercises.  Complete these exercises as instructed by your physician, physical therapist or athletic trainer. Progress the resistance and repetitions only as guided. STRENGTH -  Towel Curls  Sit in a chair positioned on a non-carpeted surface.  Place your foot on a towel, keeping your heel on the floor.  Pull the towel toward your heel by only curling your toes. Keep your heel on the floor.  If instructed by your physician, physical therapist or athletic trainer, add ____________________ at the end of the towel. Repeat __________ times. Complete this exercise __________ times per day. STRENGTH - Ankle Inversion  Secure one end of a rubber exercise band/tubing to a fixed object (table, pole). Loop the other end around your foot just before your toes.  Place your fists between your knees. This will focus your strengthening at your ankle.  Slowly, pull your big toe up and in, making sure the band/tubing is positioned to resist the entire motion.  Hold this position for __________ seconds.  Have your muscles resist the band/tubing as it slowly pulls your foot back to the starting position. Repeat __________ times. Complete this exercises __________ times per day.  Document Released: 05/08/2005 Document Revised: 07/31/2011 Document Reviewed: 08/20/2008 [employer] Patient Information 2015 [employer]. This information is not intended to replace advice given to you by your health care provider. Make sure you discuss any questions you have with your health care provider.  

## 2014-02-09 NOTE — ED Provider Notes (Signed)
CSN: [id number]     Arrival date & time 02/09/14  1327 History  This chart was scribed for [name], [place] with Benny Lennert, MD by Tonye Royalty, ED Scribe. This patient was seen in room APFT21/APFT21 and the patient's care was started at 1:45 PM.    Chief Complaint  Patient presents with  . Foot Pain   The history is provided by the patient. No language interpreter was used.   HPI Comments: [name] is a 48 y.o. female who presents to the Emergency Department complaining of throbbing right heel pain with onset 3 weeks ago. She denies knowledge of any injury to the area. She reports difficulty walking at night when she gets up to use the restroom and states she has to drag the foot do to pain with walking on the heel.  Past Medical History  Diagnosis Date  . Anxiety   . Asthma   . Diabetes mellitus     Scheduled to go on insulin pump  . History of heart attack   . GERD (gastroesophageal reflux disease)   . Esophageal stricture   . CAD (coronary artery disease)     4v CABG, 9/10; NL LVF, status post followup Cardiolite November 2012 no ischemia ejection fraction 65%  . Dyslipidemia      LDL 22 mg percent on Crestor  . Hypothyroidism   . Hypertension   . Hx of CABG     September, 2010  . Ejection fraction    Past Surgical History  Procedure Laterality Date  . Angioplasty      with stenting   . Coronary artery bypass graft  01/26/2009  . Open heart surgery   2010  . Cardiac surgery    . Cesarean section     Family History  Problem Relation Age of Onset  . Colon cancer Neg Hx   . Heart disease Father   . Diabetes Mother    History  Substance Use Topics  . Smoking status: Former Smoker -- 1.00 packs/day for 3 years    Types: Cigarettes    Quit date: 05/22/1993  . Smokeless tobacco: Never Used     Comment:  Year Quit: 1995  . Alcohol Use: No   OB History   Grav Para Term Preterm Abortions TAB SAB Ect Mult Living                 Review of Systems   Musculoskeletal:       Left heel pain  Neurological: Negative for weakness and numbness.      Allergies  Metformin; Metformin and related; and Penicillins  Home Medications   Prior to Admission medications   Medication Sig Start Date End Date Taking? Authorizing Provider  albuterol (PROVENTIL) (2.5 MG/3ML) 0.083% nebulizer solution Take 2.5 mg by nebulization every 4 (four) hours as needed. For wheezing.    Historical Provider, MD  albuterol (VENTOLIN HFA) 108 (90 BASE) MCG/ACT inhaler Inhale 2 puffs into the lungs. 10/22/12 10/22/13  Historical Provider, MD  ALPRAZolam Prudy Feeler) 0.5 MG tablet TAKE 1 TABLET BY MOUTH TWICE A DAY 12/18/13   Louis Meckel, MD  amLODipine (NORVASC) 5 MG tablet Take by mouth. 08/07/12   Historical Provider, MD  aspirin 81 MG chewable tablet Chew 81 mg by mouth daily.    Historical Provider, MD  azithromycin (ZITHROMAX) 200 MG/5ML suspension Take 6.3 mLs (250 mg total) by mouth daily. 09/05/13   Emilia Beck, PA-C  fish oil-omega-3 fatty acids 1000 MG capsule  Take 2 capsules (2 g total) by mouth 2 (two) times daily. 07/03/12   Rande Brunt, PA-C  Fluticasone-Salmeterol (ADVAIR DISKUS) 100-50 MCG/DOSE AEPB Inhale 1 puff into the lungs every 4 (four) hours as needed (wheezing).     Historical Provider, MD  furosemide (LASIX) 40 MG tablet Take 1 tablet (40 mg total) by mouth daily. 11/19/13   Luis Abed, MD  hyoscyamine (LEVSIN SL) 0.125 MG SL tablet Place under the tongue.    Historical Provider, MD  ibuprofen (CHILDRENS IBUPROFEN) 100 MG/5ML suspension Take 30 mLs (600 mg total) by mouth every 4 (four) hours as needed. 09/05/13   Kaitlyn Szekalski, PA-C  insulin aspart (NOVOLOG) 100 UNIT/ML injection Use 100 - 150 units daily in insulin pump 07/18/13   Historical Provider, MD  lansoprazole (PREVACID SOLUTAB) 30 MG disintegrating tablet Take 30 mg by mouth.    Historical Provider, MD  levothyroxine (SYNTHROID, LEVOTHROID) 100 MCG tablet Take 100 mcg by mouth  daily before breakfast.    Historical Provider, MD  metoprolol tartrate (LOPRESSOR) 25 MG tablet Take 0.5 tablets (12.5 mg total) by mouth 2 (two) times daily. 12/23/13   Luis Abed, MD  nitroGLYCERIN (NITROSTAT) 0.4 MG SL tablet Place 0.4 mg under the tongue every 5 (five) minutes as needed for chest pain.    Historical Provider, MD  Omega-3 Fatty Acids (FISH OIL) 1000 MG CAPS Take 2 capsules by mouth. 07/03/12   Historical Provider, MD  PREVACID SOLUTAB 30 MG disintegrating tablet TAKE 1 TABLET (30 MG TOTAL) BY MOUTH DAILY. 01/22/14   Louis Meckel, MD  rosuvastatin (CRESTOR) 10 MG tablet Take 10 mg by mouth. 12/12/12   Historical Provider, MD  tiZANidine (ZANAFLEX) 4 MG tablet Take 0.5-1 tablets (2-4 mg total) by mouth every 8 (eight) hours as needed. For spasms 08/03/13   Pearline Cables, MD   BP 140/66  Pulse 63  Temp(Src) 98.1 F (36.7 C) (Oral)  Resp 18  Ht  (1.626 m)  Wt 195 lb (88.451 kg)  BMI 33.46 kg/m2  SpO2 100%  LMP 01/26/2014 Physical Exam  Nursing note and vitals reviewed. Constitutional: She is oriented to person, place, and time. She appears well-developed and well-nourished.  HENT:  Head: Normocephalic and atraumatic.  Eyes: Conjunctivae are normal.  Neck: Normal range of motion. Neck supple.  Cardiovascular: Normal rate, regular rhythm and normal heart sounds.   No murmur heard. Pulmonary/Chest: Breath sounds normal. No respiratory distress. She has no wheezes. She has no rales.  Musculoskeletal: Normal range of motion.  Tender along the arch and heel of the right foot. No redness or swelling to the area  Neurological: She is alert and oriented to person, place, and time.  Skin: Skin is warm and dry.  Psychiatric: She has a normal mood and affect.    ED Course  Procedures (including critical care time) Labs Review Labs Reviewed - No data to display  Imaging Review No results found.   EKG Interpretation None     DIAGNOSTIC STUDIES: Oxygen  Saturation is 100% on room air, normal by my interpretation.    COORDINATION OF CARE: 1:50 PM Discussed treatment plan, including X-ray and appropriate foootwear, with patient at beside, the patient agrees with the plan and has no further questions at this time.   MDM   Final diagnoses:  Plantar fasciitis of right foot  Calcaneal spur, right   Discussed stretching and shoes with pt.pt verbilized understanding  I personally performed the services described in  this documentation, which was scribed in my presence. The recorded information has been reviewed and is accurate.    Kim Lower, NP 02/09/14 1442

## 2014-02-10 NOTE — ED Provider Notes (Signed)
Medical screening examination/treatment/procedure(s) were performed by non-physician practitioner and as supervising physician I was immediately available for consultation/collaboration.   EKG Interpretation None        Yailyn Strack L Lee Kuang, MD 02/10/14 1255 

## 2014-03-04 ENCOUNTER — Other Ambulatory Visit: Payer: Self-pay | Admitting: Nurse Practitioner

## 2014-03-04 DIAGNOSIS — M722 Plantar fascial fibromatosis: Secondary | ICD-10-CM | POA: Insufficient documentation

## 2014-03-04 DIAGNOSIS — Z1231 Encounter for screening mammogram for malignant neoplasm of breast: Secondary | ICD-10-CM

## 2014-03-18 ENCOUNTER — Ambulatory Visit: Payer: Medicare Other

## 2014-03-20 ENCOUNTER — Ambulatory Visit (INDEPENDENT_AMBULATORY_CARE_PROVIDER_SITE_OTHER): Payer: Medicare Other | Admitting: Ophthalmology

## 2014-03-20 ENCOUNTER — Telehealth: Payer: Self-pay | Admitting: Gastroenterology

## 2014-03-20 ENCOUNTER — Other Ambulatory Visit: Payer: Self-pay | Admitting: Gastroenterology

## 2014-03-20 DIAGNOSIS — I10 Essential (primary) hypertension: Secondary | ICD-10-CM

## 2014-03-20 DIAGNOSIS — E10359 Type 1 diabetes mellitus with proliferative diabetic retinopathy without macular edema: Secondary | ICD-10-CM

## 2014-03-20 DIAGNOSIS — E10319 Type 1 diabetes mellitus with unspecified diabetic retinopathy without macular edema: Secondary | ICD-10-CM

## 2014-03-20 DIAGNOSIS — H35033 Hypertensive retinopathy, bilateral: Secondary | ICD-10-CM

## 2014-03-20 DIAGNOSIS — H43813 Vitreous degeneration, bilateral: Secondary | ICD-10-CM

## 2014-03-20 NOTE — Telephone Encounter (Signed)
ok 

## 2014-03-20 NOTE — Telephone Encounter (Signed)
Patients Xanax faxed to pharmacy  FYI- Dr Arlyce DiceKaplan I refilled this patients xanax

## 2014-03-24 ENCOUNTER — Ambulatory Visit (INDEPENDENT_AMBULATORY_CARE_PROVIDER_SITE_OTHER): Payer: Medicare Other | Admitting: Cardiology

## 2014-03-24 ENCOUNTER — Encounter: Payer: Self-pay | Admitting: Cardiology

## 2014-03-24 VITALS — BP 115/69 | HR 69 | Ht 64.0 in | Wt 220.0 lb

## 2014-03-24 DIAGNOSIS — I2510 Atherosclerotic heart disease of native coronary artery without angina pectoris: Secondary | ICD-10-CM

## 2014-03-24 DIAGNOSIS — E785 Hyperlipidemia, unspecified: Secondary | ICD-10-CM

## 2014-03-24 DIAGNOSIS — I25810 Atherosclerosis of coronary artery bypass graft(s) without angina pectoris: Secondary | ICD-10-CM

## 2014-03-24 DIAGNOSIS — R002 Palpitations: Secondary | ICD-10-CM

## 2014-03-24 MED ORDER — ROSUVASTATIN CALCIUM 10 MG PO TABS: 10.0000 mg | ORAL_TABLET | Freq: Every day | ORAL | Status: DC

## 2014-03-24 MED ORDER — METOPROLOL TARTRATE 25 MG PO TABS: 25.0000 mg | ORAL_TABLET | Freq: Two times a day (BID) | ORAL | Status: DC

## 2014-03-24 NOTE — Assessment & Plan Note (Signed)
She is not having any significant palpitations. 

## 2014-03-24 NOTE — Assessment & Plan Note (Addendum)
The patient has continued exertional chest burning. She has had this since her bypass surgery. In 2014 cardiac catheterization revealed that her grafts were patent. I'm not inclined to push for invasive testing at this time. I will try to adjust her medications to see if this helps. We are limited by the fact that she also has significant asthma. However I will try to push her metoprolol dose little higher.

## 2014-03-24 NOTE — Assessment & Plan Note (Signed)
We have tried to use a higher dose of Crestor. She thinks that she has aches and pains at 20 mg. She has been on 10 mg. Lipids will be checked. I will then decide what changes to make next.

## 2014-03-24 NOTE — Patient Instructions (Signed)
Your physician recommends that you schedule a follow-up appointment in: 4-6 weeks. Your physician has recommended you make the following change in your medication:  Increase your metoprolol tartrate to 25 mg twice daily. Continue all other medications the same. Please monitor for signs of having problems with your asthma after dose increase of lopressor. If you notice a problem, please decrease lopressor back to 12.5 mg twice daily. Your physician recommends that you have a FASTING lipid profile done.

## 2014-03-24 NOTE — Progress Notes (Signed)
Patient ID: [name], female   DOB: 06-27-1965, 48 y.o.   MRN: [id number]    HPI  the patient is seen for follow-up of her coronary disease. She underwent bypass surgery in the past. She was having a burning sensation in her chest in April, 2014. Her exercise test did not show ischemia. However her symptoms were concerning. She underwent catheterization and all of her grafts were patent. Over time she continues to have the same exertional burning with exercise. She does not have symptoms during her regular activities. The pattern has remained the same. She has significant asthma. She has been on the low-dose of selective beta blocker. She is not having any significant palpitations.  Allergies  Allergen Reactions  . Metformin Anaphylaxis  . Metformin And Related Shortness Of Breath  . Penicillins Anaphylaxis    Throat swells difficulty breathing    Current Outpatient Prescriptions  Medication Sig Dispense Refill  . albuterol (PROVENTIL) (2.5 MG/3ML) 0.083% nebulizer solution Take 2.5 mg by nebulization every 4 (four) hours as needed. For wheezing.    Marland Kitchen. albuterol (VENTOLIN HFA) 108 (90 BASE) MCG/ACT inhaler Inhale 2 puffs into the lungs every 6 (six) hours as needed for wheezing or shortness of breath.     . ALPRAZolam (XANAX) 0.5 MG tablet TAKE 1 TABLET BY MOUTH TWICE A DAY 60 tablet 2  . amLODipine (NORVASC) 5 MG tablet Take by mouth.    Marland Kitchen. aspirin 81 MG chewable tablet Chew 81 mg by mouth daily.    . fish oil-omega-3 fatty acids 1000 MG capsule Take 2 capsules (2 g total) by mouth 2 (two) times daily.    . Fluticasone-Salmeterol (ADVAIR DISKUS) 100-50 MCG/DOSE AEPB Inhale 1 puff into the lungs every 4 (four) hours as needed (wheezing).     . furosemide (LASIX) 40 MG tablet Take 1 tablet (40 mg total) by mouth daily. 30 tablet 6  . hyoscyamine (LEVSIN SL) 0.125 MG SL tablet Place 0.125 mg under the tongue every 6 (six) hours as needed for cramping.     . Insulin Human (INSULIN PUMP)  SOLN Inject into the skin continuous. NOVOLOG INSULIN    . lansoprazole (PREVACID SOLUTAB) 30 MG disintegrating tablet Take 30 mg by mouth.    . levothyroxine (SYNTHROID, LEVOTHROID) 100 MCG tablet Take 100 mcg by mouth daily before breakfast.    . metoprolol tartrate (LOPRESSOR) 25 MG tablet Take 0.5 tablets (12.5 mg total) by mouth 2 (two) times daily. 30 tablet 6  . nitroGLYCERIN (NITROSTAT) 0.4 MG SL tablet Place 0.4 mg under the tongue every 5 (five) minutes as needed for chest pain.    . rosuvastatin (CRESTOR) 10 MG tablet Take 10 mg by mouth.     No current facility-administered medications for this visit.    History   Social History  . Marital Status: Single    Spouse Name: N/A    Number of Children: 1  . Years of Education: N/A   Occupational History  . disabilied    Social History Main Topics  . Smoking status: Former Smoker -- 1.00 packs/day for 3 years    Types: Cigarettes    Quit date: 05/22/1993  . Smokeless tobacco: Never Used     Comment:  Year Quit: 1995  . Alcohol Use: No  . Drug Use: No  . Sexual Activity: Yes   Other Topics Concern  . Not on file   Social History Narrative    Family History  Problem Relation Age of Onset  .  Colon cancer Neg Hx   . Heart disease Father   . Diabetes Mother     Past Medical History  Diagnosis Date  . Anxiety   . Asthma   . Diabetes mellitus     Scheduled to go on insulin pump  . History of heart attack   . GERD (gastroesophageal reflux disease)   . Esophageal stricture   . CAD (coronary artery disease)     4v CABG, 9/10; NL LVF, status post followup Cardiolite November 2012 no ischemia ejection fraction 65%  . Dyslipidemia      LDL 22 mg percent on Crestor  . Hypothyroidism   . Hypertension   . Hx of CABG     September, 2010  . Ejection fraction     Past Surgical History  Procedure Laterality Date  . Angioplasty      with stenting   . Coronary artery bypass graft  01/26/2009  . Open heart surgery    2010  . Cardiac surgery    . Cesarean section      Patient Active Problem List   Diagnosis Date Noted  . Obesity, unspecified 08/03/2013  . Hx of CABG   . Ejection fraction   . HTN (hypertension) 08/22/2012  . Hypothyroidism 09/29/2011  . Edema 08/25/2011  . Gastroparesis 09/07/2010  . DYSLIPIDEMIA 02/21/2010  . MUSCULOSKELETAL PAIN 04/29/2009  . PALPITATIONS 04/12/2009  . WHEEZING 04/12/2009  . GERD 01/19/2009  . DIABETES MELLITUS 06/12/2007  . ANXIETY 06/12/2007  . CORONARY ARTERY DISEASE 06/12/2007  . ASTHMA 06/12/2007    ROS   patient denies fever, chills, headache, sweats, rash, change in vision, change in hearing, cough, nausea vomiting, urinary symptoms. All other systems are reviewed and are negative.  PHYSICAL EXAM  patient is overweight. She is oriented to person time and place. Affect is normal. Head is atraumatic. Sclera and conjunctiva are normal. There is no jugular venous distention. Lungs are clear. Respiratory effort is not labored. Cardiac exam reveals an S1 and S2. The abdomen is soft. There is no peripheral edema.  Filed Vitals:   03/24/14 1548  BP: 115/69  Pulse: 69  Height: 5\' 4"  (1.626 m)  Weight: 220 lb (99.791 kg)  SpO2: 100%     ASSESSMENT & PLAN

## 2014-04-02 ENCOUNTER — Telehealth: Payer: Self-pay | Admitting: *Deleted

## 2014-04-02 NOTE — Telephone Encounter (Signed)
-----   Message from Luis AbedJeffrey D Katz, MD sent at 03/27/2014  5:45 PM EST ----- Please notify the patient that I have reviewed her lipid studies. Her bad cholesterol is still higher than I would like. She is on 10 mg of Crestor. Previously she said she felt poorly with 20 mg. Asked her to see if she would try to increase the dose again. If she is willing and she feels well this will be good. If she decides to do it and she feels poorly, she can cut back to 10 mg. If she is not willing to try the higher dose, I will have to follow her with this dose for now.

## 2014-04-03 MED ORDER — ROSUVASTATIN CALCIUM 20 MG PO TABS: 20.0000 mg | ORAL_TABLET | Freq: Every day | ORAL | Status: DC

## 2014-04-03 NOTE — Telephone Encounter (Signed)
Patient informed and agrees to increase crestor to 20 mg daily.

## 2014-04-06 ENCOUNTER — Ambulatory Visit: Payer: Medicare Other

## 2014-04-13 ENCOUNTER — Emergency Department (HOSPITAL_COMMUNITY)
Admission: EM | Admit: 2014-04-13 | Discharge: 2014-04-13 | Disposition: A | Discharge status: Home / Self Care | Payer: Medicare Other | Attending: Emergency Medicine | Admitting: Emergency Medicine

## 2014-04-13 ENCOUNTER — Encounter (HOSPITAL_COMMUNITY): Payer: Self-pay | Admitting: Emergency Medicine

## 2014-04-13 DIAGNOSIS — K029 Dental caries, unspecified: Secondary | ICD-10-CM | POA: Diagnosis not present

## 2014-04-13 DIAGNOSIS — J45909 Unspecified asthma, uncomplicated: Secondary | ICD-10-CM | POA: Diagnosis not present

## 2014-04-13 DIAGNOSIS — Z79899 Other long term (current) drug therapy: Secondary | ICD-10-CM | POA: Diagnosis not present

## 2014-04-13 DIAGNOSIS — E785 Hyperlipidemia, unspecified: Secondary | ICD-10-CM | POA: Diagnosis not present

## 2014-04-13 DIAGNOSIS — I2510 Atherosclerotic heart disease of native coronary artery without angina pectoris: Secondary | ICD-10-CM | POA: Insufficient documentation

## 2014-04-13 DIAGNOSIS — E079 Disorder of thyroid, unspecified: Secondary | ICD-10-CM | POA: Diagnosis not present

## 2014-04-13 DIAGNOSIS — Z951 Presence of aortocoronary bypass graft: Secondary | ICD-10-CM | POA: Insufficient documentation

## 2014-04-13 DIAGNOSIS — K219 Gastro-esophageal reflux disease without esophagitis: Secondary | ICD-10-CM | POA: Insufficient documentation

## 2014-04-13 DIAGNOSIS — K0889 Other specified disorders of teeth and supporting structures: Secondary | ICD-10-CM

## 2014-04-13 DIAGNOSIS — Z792 Long term (current) use of antibiotics: Secondary | ICD-10-CM | POA: Insufficient documentation

## 2014-04-13 DIAGNOSIS — Z7982 Long term (current) use of aspirin: Secondary | ICD-10-CM | POA: Insufficient documentation

## 2014-04-13 DIAGNOSIS — I10 Essential (primary) hypertension: Secondary | ICD-10-CM | POA: Insufficient documentation

## 2014-04-13 DIAGNOSIS — E119 Type 2 diabetes mellitus without complications: Secondary | ICD-10-CM | POA: Diagnosis not present

## 2014-04-13 DIAGNOSIS — Z87891 Personal history of nicotine dependence: Secondary | ICD-10-CM | POA: Diagnosis not present

## 2014-04-13 DIAGNOSIS — K088 Other specified disorders of teeth and supporting structures: Secondary | ICD-10-CM | POA: Insufficient documentation

## 2014-04-13 MED ORDER — CLINDAMYCIN HCL 300 MG PO CAPS: 300.0000 mg | ORAL_CAPSULE | Freq: Four times a day (QID) | ORAL | Status: DC

## 2014-04-13 MED ORDER — TRAMADOL HCL 50 MG PO TABS: 50.0000 mg | ORAL_TABLET | Freq: Four times a day (QID) | ORAL | Status: DC | PRN

## 2014-04-13 NOTE — ED Notes (Signed)
Patient complaining of lower left dental pain x 2 weeks.  

## 2014-04-13 NOTE — ED Notes (Signed)
Dental pain since last night,  Pain lt mandibular tooth that is "broken.  "

## 2014-04-13 NOTE — ED Provider Notes (Signed)
CSN: [id number]     Arrival date & time 04/13/14  1141 History  This chart was scribed for non-physician practitioner Pauline Ausammy Jannah Guardiola, PA-C working with Donnetta HutchingBrian Cook, MD by Littie Deedsichard Sun, ED Scribe. This patient was seen in room APFT22/APFT22 and the patient's care was started at 2:02 PM.    Chief Complaint  Patient presents with  . Dental Pain    The history is provided by the patient. No language interpreter was used.   HPI Comments: [name] is a 48 y.o. female who presents to the Emergency Department complaining of intermittent lower left dental pain that began a few days ago after a lower left tooth broke off 2 weeks ago. [facility] but [facility] was on vacation for the week; she was told it would be a while before she could be seen. Patient denies fever, chills and difficulty swallowing. She reports allergies to penicillin.    Past Medical History  Diagnosis Date  . Anxiety   . Asthma   . Diabetes mellitus     Scheduled to go on insulin pump  . History of heart attack   . GERD (gastroesophageal reflux disease)   . Esophageal stricture   . CAD (coronary artery disease)     4v CABG, 9/10; NL LVF, status post followup Cardiolite November 2012 no ischemia ejection fraction 65%  . Dyslipidemia      LDL 22 mg percent on Crestor  . Hypothyroidism   . Hypertension   . Hx of CABG     September, 2010  . Ejection fraction    Past Surgical History  Procedure Laterality Date  . Angioplasty      with stenting   . Coronary artery bypass graft  01/26/2009  . Open heart surgery   2010  . Cardiac surgery    . Cesarean section     Family History  Problem Relation Age of Onset  . Colon cancer Neg Hx   . Heart disease Father   . Diabetes Mother    History  Substance Use Topics  . Smoking status: Former Smoker -- 1.00 packs/day for 3 years    Types: Cigarettes    Quit date: 05/22/1993  . Smokeless tobacco: Never Used     Comment:  Year  Quit: 1995  . Alcohol Use: No   OB History    No data available     Review of Systems  Constitutional: Negative for fever and chills.  HENT: Positive for dental problem. Negative for trouble swallowing.   All other systems reviewed and are negative.     Allergies  Metformin; Metformin and related; and Penicillins  Home Medications   Prior to Admission medications   Medication Sig Start Date End Date Taking? Authorizing Provider  acetaminophen (TYLENOL) 160 MG/5ML solution Take 320 mg by mouth every 6 (six) hours as needed for moderate pain.   Yes Historical Provider, MD  albuterol (PROVENTIL) (2.5 MG/3ML) 0.083% nebulizer solution Take 2.5 mg by nebulization every 4 (four) hours as needed. For wheezing.   Yes Historical Provider, MD  albuterol (VENTOLIN HFA) 108 (90 BASE) MCG/ACT inhaler Inhale 2 puffs into the lungs every 6 (six) hours as needed for wheezing or shortness of breath.  10/22/12  Yes Historical Provider, MD  ALPRAZolam Prudy Feeler(XANAX) 0.5 MG tablet TAKE 1 TABLET BY MOUTH TWICE A DAY 03/20/14  Yes Louis Meckelobert D Kaplan, MD  amLODipine (NORVASC) 5 MG tablet Take 5 mg by mouth daily.  08/07/12  Yes Historical Provider, MD  aspirin 81 MG chewable tablet Chew 81 mg by mouth daily.   Yes Historical Provider, MD  fish oil-omega-3 fatty acids 1000 MG capsule Take 2 capsules (2 g total) by mouth 2 (two) times daily. 07/03/12  Yes Rande BruntEugene C Serpe, PA-C  Fluticasone-Salmeterol (ADVAIR DISKUS) 100-50 MCG/DOSE AEPB Inhale 1 puff into the lungs 2 (two) times daily.    Yes Historical Provider, MD  furosemide (LASIX) 40 MG tablet Take 1 tablet (40 mg total) by mouth daily. 11/19/13  Yes Luis AbedJeffrey D Katz, MD  Insulin Human (INSULIN PUMP) SOLN Inject into the skin continuous. NOVOLOG INSULIN   Yes Historical Provider, MD  lansoprazole (PREVACID SOLUTAB) 30 MG disintegrating tablet Take 30 mg by mouth daily.    Yes Historical Provider, MD  levothyroxine (SYNTHROID, LEVOTHROID) 100 MCG tablet Take 100 mcg by  mouth daily before breakfast.   Yes Historical Provider, MD  metoprolol tartrate (LOPRESSOR) 25 MG tablet Take 1 tablet (25 mg total) by mouth 2 (two) times daily. Patient taking differently: Take 12.5 mg by mouth 2 (two) times daily.  03/24/14  Yes Luis AbedJeffrey D Katz, MD  nitroGLYCERIN (NITROSTAT) 0.4 MG SL tablet Place 0.4 mg under the tongue every 5 (five) minutes as needed for chest pain.   Yes Historical Provider, MD  rosuvastatin (CRESTOR) 20 MG tablet Take 1 tablet (20 mg total) by mouth daily. 04/03/14  Yes Luis AbedJeffrey D Katz, MD   BP 126/66 mmHg  Pulse 67  Temp(Src) 98.8 F (37.1 C) (Oral)  Resp 16  Ht 5\' 4"  (1.626 m)  Wt 221 lb (100.245 kg)  BMI 37.92 kg/m2  SpO2 100%  LMP 04/07/2014 Physical Exam  Constitutional: She is oriented to person, place, and time. She appears well-developed and well-nourished. No distress.  HENT:  Head: Normocephalic and atraumatic.  Mouth/Throat: Oropharynx is clear and moist. Dental caries present. No dental abscesses. No oropharyngeal exudate.  TTP in dental caries in second left lower premolar. No obvious dental abscess. Multiple dental caries are present.  Eyes: Pupils are equal, round, and reactive to light.  Neck: Neck supple.  Cardiovascular: Normal rate.   Pulmonary/Chest: Effort normal.  Musculoskeletal: She exhibits no edema.  Neurological: She is alert and oriented to person, place, and time. No cranial nerve deficit.  Skin: Skin is warm and dry. No rash noted.  Psychiatric: She has a normal mood and affect. Her behavior is normal.  Nursing note and vitals reviewed.   ED Course  Procedures  DIAGNOSTIC STUDIES: Oxygen Saturation is 100% on room air, normal by my interpretation.    COORDINATION OF CARE: 2:06 PM-Discussed treatment plan which includes abx medication and recommendation of saltwater rinses with pt at bedside and pt agreed to plan.    Labs Review Labs Reviewed - No data to display  Imaging Review No results found.    EKG Interpretation None      MDM   Final diagnoses:  Pain, dental    Pt is well appearing.  No concerning sx's for infection to floor of mouth or deep structures of the neck.  rx for clindamycin and ultram  I personally performed the services described in this documentation, which was scribed in my presence. The recorded information has been reviewed and is accurate.    Royelle Hinchman L. Trisha Mangleriplett, PA-C 04/15/14 1600  Donnetta HutchingBrian Cook, MD 04/18/14 1103

## 2014-04-13 NOTE — Discharge Instructions (Signed)
Dental Pain °A tooth ache may be caused by cavities (tooth decay). Cavities expose the nerve of the tooth to air and hot or cold temperatures. It may come from an infection or abscess (also called a boil or furuncle) around your tooth. It is also often caused by dental caries (tooth decay). This causes the pain you are having. °DIAGNOSIS  °Your caregiver can diagnose this problem by exam. °TREATMENT  °· If caused by an infection, it may be treated with medications which kill germs (antibiotics) and pain medications as prescribed by your caregiver. Take medications as directed. °· Only take over-the-counter or prescription medicines for pain, discomfort, or fever as directed by your caregiver. °· Whether the tooth ache today is caused by infection or dental disease, you should see your dentist as soon as possible for further care. °SEEK MEDICAL CARE IF: °The exam and treatment you received today has been provided on an emergency basis only. This is not a substitute for complete medical or dental care. If your problem worsens or new problems (symptoms) appear, and you are unable to meet with your dentist, call or return to this location. °SEEK IMMEDIATE MEDICAL CARE IF:  °· You have a fever. °· You develop redness and swelling of your face, jaw, or neck. °· You are unable to open your mouth. °· You have severe pain uncontrolled by pain medicine. °MAKE SURE YOU:  °· Understand these instructions. °· Will watch your condition. °· Will get help right away if you are not doing well or get worse. °Document Released: 05/08/2005 Document Revised: 07/31/2011 Document Reviewed: 12/25/2007 °ExitCare® Patient Information ©2015 [employer]. This information is not intended to replace advice given to you by your health care provider. Make sure you discuss any questions you have with your health care provider. ° °Emergency Department Resource Guide °1) Find a Doctor and Pay Out of Pocket °Although you won't have to find out who  is covered by your insurance plan, it is a good idea to ask around and get recommendations. You will then need to call the office and see if the doctor you have chosen will accept you as a new patient and what types of options they offer for patients who are self-pay. Some doctors offer discounts or will set up payment plans for their patients who do not have insurance, but you will need to ask so you aren't surprised when you get to your appointment. ° °2) Contact Your Local Health Department °Not all health departments have doctors that can see patients for sick visits, but many do, so it is worth a call to see if yours does. If you don't know where your local health department is, you can check in your phone book. The CDC also has a tool to help you locate your state's health department, and many state websites also have listings of all of their local health departments. ° °3) Find a Walk-in Clinic °If your illness is not likely to be very severe or complicated, you may want to try a walk in clinic. These are popping up all over the country in pharmacies, drugstores, and shopping centers. They're usually staffed by nurse practitioners or physician assistants that have been trained to treat common illnesses and complaints. They're usually fairly quick and inexpensive. However, if you have serious medical issues or chronic medical problems, these are probably not your best option. ° °No Primary Care Doctor: °- Call [facility] at  832-8000 - they can help you locate a primary   care doctor that  accepts your insurance, provides certain services, etc. °- Physician Referral Service- 1-800-533-3463 ° °Chronic Pain Problems: °Organization         Address  Phone   Notes  °[facility]  (336) 297-2271 Patients need to be referred by their primary care doctor.  ° °Medication Assistance: °Organization         Address  Phone   Notes  °[place] [facility] [address]  [address] °[place], [place] [address] (336) 641-8030 --Must be a resident of [place] °-- Must have NO insurance coverage whatsoever (no Medicaid/ Medicare, etc.) °-- The pt. MUST have a primary care doctor that directs their care regularly and follows them in the community °  °[facility]  (866) 331-1348   °[employer]  (888) 892-1162   ° °Agencies that provide inexpensive medical care: °Organization         Address  Phone   Notes  °[facility]  (336) 832-8035   °[facility]    (336) 832-7272   °[facility] [address] °[place], [place] [address] (336) 832-4777   °[facility] [address], °[place] (336) 271-4999   °[facility]    (336) 373-0678   °[facility]    (336) 272-1050   °[facility] ° [address], [place] Phone:  (336) 832-4444, Fax:  (336) 832-4440 Hours of Operation:  9 am - 6 pm, M-F.  Also accepts Medicaid/Medicare and self-pay.  °[facility] ° [address], [place] Phone: (336) 832-3150, Fax: (336) 832-3151. Hours of Operation:  8:30 am - 5:30 pm, M-F.  Also accepts Medicaid and self-pay.  °[facility] [place] [address], [place] Phone: (336) 878-6027   °[facility] [address], [place], [place] (336)723-1848, Ext. 123 Mondays & Thursdays: 7-9 AM.  First 15 patients are seen on a first come, first serve basis. °  ° °Medicaid-accepting [place] Providers: ° °Organization         Address  Phone   Notes  °[facility] [address], [place] (336) 641-2100 Also accepts self-pay patients.  °[facility] [address], [place] ° (336) 856-9996   °[facility] [address], [place] (336) 288-8857   °[facility] [address] [place] [address], [place] (336) 299-7000   °Veita Bland [address], [place]  ° (336) 373-1557 Only accepts [place] Access Medicaid patients after they have their name applied to their card.  ° °Self-Pay (no insurance) in [place]: ° °Organization         Address  Phone   Notes  °Sickle Cell Patients, [facility] [address], [place] (336) 832-1970   °[facility] [address], [place] (336) 832-4400   ° [facility] ° [address], [place] (336) 992-4800   °[facility]/Dr. Osei-Bonsu ° [address] [place] [address], [place] or [address], [place] (336) 841-8500 Phone number for both [place] and [place] locations is the same.  °[facility] [address], [place] (336) 299-0000   °[facility] [address] [place] [address], [place] or [address] (336) 852-7530 °(336) 878-2260   °  [facility] [address], [place] (336) 350-1642, phone; (336) 294-5005, fax Sees patients 1st and 3rd Saturday of every month.  Must not qualify for public or private insurance (i.e. Medicaid, Medicare, [place] Health Choice, Veterans' Benefits) • Household income should be no more than 200% of the poverty level •The clinic cannot treat you if you are pregnant or think you are pregnant • Sexually transmitted diseases are not treated at the clinic.  ° ° °Dental Care: °Organization         Address  Phone  Notes  °[facility] [address], [place] (336) 641-6152 Accepts children up to age 21 who are enrolled in Medicaid or [place] Health Choice; pregnant women with a Medicaid card; and children who have applied for Medicaid or [place] Health Choice, but were declined, whose parents can pay a reduced fee at time of service.  °[facility] [place]  [address], [place] (336) 641-7733 Accepts children up to age 21 who are  enrolled in Medicaid or [place] Health Choice; pregnant women with a Medicaid card; and children who have applied for Medicaid or [place] Health Choice, but were declined, whose parents can pay a reduced fee at time of service.  °[facility] ° [address], [place] (336) 641-4533 Patients are seen by appointment only. Walk-ins are not accepted. [facility] will see patients 18 years of age and older. °Monday - Tuesday (8am-5pm) °Most Wednesdays (8:30-5pm) °$30 per visit, cash only  °[facility] ° [address], [place] (336) 641-4533 Patients are seen by appointment only. Walk-ins are not accepted. [facility] will see patients 18 years of age and older. °One Wednesday Evening (Monthly: Volunteer Based).  $30 per visit, cash only  °[facility]  (919) 537-3737 for adults; Children under age 4, call Graduate Pediatric Dentistry at (919) [phone]. Children aged 4-14, please call (919) [phone] to request a pediatric application. ° Dental services are provided in all areas of dental care including fillings, crowns and bridges, complete and partial dentures, implants, gum treatment, root canals, and extractions. Preventive care is also provided. Treatment is provided to both adults and children. °Patients are selected via a lottery and there is often a waiting list. °  °[facility] [address], °[place] ° (336) 763-8833 [email or web address] °  °[facility] [address], [place], [place] (336)723-1848, Ext. 123 Second and Fourth Thursday of each month, opens at 6:30 AM; Clinic ends at 9 AM.  Patients are seen on a first-come first-served basis, and a limited number are seen during each clinic.  ° °[facility] ° [address], [place], [place] (336) 723-7904   Eligibility Requirements °You must have lived in [place], [place], or [place] counties for at least the last three months. °  You  cannot be eligible for state or federal sponsored healthcare insurance, including Veterans Administration, Medicaid, or Medicare. °  You generally cannot be eligible for healthcare insurance through your employer.  °  How to apply: °Eligibility screenings are held every Tuesday and Wednesday afternoon from 1:00 pm until 4:00 pm. You do not need an appointment for the interview!  °[facility] [address], [place], [place] 336-631-2330   °[facility]  336-342-8273   °[facility]  336-703-3100   °[facility]  336-570-6415   ° °Behavioral Health Resources in the Community: °Intensive Outpatient Programs °Organization         Address  Phone  Notes  °[facility] [address], [place], [place] 336-878-6098   °[facility] [address], [place], [place] 336-832-9800   °[facility] [address], [place], [place] ° 336-882-2125   °[facility] [address],  °[place], [place] 1-800-853-5163 or 336-641-4981   °Substance Abuse Resources °Organization         Address  Phone  Notes  °[facility]  336-882-2125   °[facility]  336-784-9470   °[facility]  336-285-9073   °[facility]  336-845-3988   °Residential & Outpatient Substance Abuse Program  1-800-659-3381   °Psychological Services °Organization         Address  Phone  Notes  °[facility]  336- 832-9600   °[facility]  336- 378-7881   °[facility] [address], [place] 1-800-853-5163 or 336-641-4981   ° °Mobile Crisis Teams °Organization         Address  Phone  Notes  °[facility], Mobile Crisis Care Unit  1-877-626-1772   °Assertive °[facility] ° [address] [place], [place] 336-834-9664   °Sharon DeEsch [address] °[place] 336-554-5454   ° °Self-Help/Support  Groups °Organization         Address  Phone             Notes  °[facility] [place] [facility] - variety of support groups  336- 373-1402 Call for more information  °Narcotics Anonymous (NA), [facility] [address], °[place]  2 meetings at this location  ° °Residential Treatment Programs °Organization         Address  Phone  Notes  °[facility] [address],    °[place]  1-866-801-8205   °[facility] ° [address], [place], [place] 704-293-8524   °[facility] [address], [place] 336-845-3988 Admissions: 8am-3pm M-F  °[facility] [address]    °[place], [place] 336-841-1104   °[facility] [address], [place], [place] 336-379-7146   °[facility] [address]  °[place], [place] 336-285-9073   °[facility] Intensive Outpatient [address], [place], [place] 336-852-3033   °[facility] [address]  °[place], [place] 1-877-615-2722 or 336-784-9470   °[facility] [address] [place], [place] 336-227-7417 Accepts Medicaid  °[facility] [address]  °[place] 1-800-659-3381 Substance Abuse/Addiction Treatment  ° °[place] Behavioral Health Resources °Organization         Address  Phone  Notes  °[facility]  (888) 581-9988   °Julie Brannon, PhD [address] [place], [place]   (336) 349-5553 or (336) 951-0000   °[facility]   [address] °[place], [place] (336) 349-4454   °[facility] [address], [place], [place] (336) 342-8316 Insurance/Medicaid/sponsorship through [facility]  °[facility] [address]                                    [place],  (336) 342-8316 Therapy/tele-psych/case  °[facility]   [address]  ° [place], [place] (336) 349-2233    °Dr. Arfeen  (336) 349-4544   °[facility] [place]  [facility]  [facility] [place] [facility] 1) [address], [place] °2) [address], [place] °3)  [address], [place] (336) 349-3220 °(336) 342-7768 ° °(336) 342-8140   °[place] [facility] (336) 342-1394 or (336) 342-3537 (After Hours)    ° ° ° °

## 2014-04-28 ENCOUNTER — Encounter: Payer: Self-pay | Admitting: Cardiology

## 2014-04-28 ENCOUNTER — Ambulatory Visit (INDEPENDENT_AMBULATORY_CARE_PROVIDER_SITE_OTHER): Payer: Medicare Other | Admitting: Cardiology

## 2014-04-28 VITALS — BP 121/78 | HR 69 | Ht 64.0 in | Wt 222.0 lb

## 2014-04-28 DIAGNOSIS — E785 Hyperlipidemia, unspecified: Secondary | ICD-10-CM

## 2014-04-28 DIAGNOSIS — R002 Palpitations: Secondary | ICD-10-CM

## 2014-04-28 DIAGNOSIS — I25709 Atherosclerosis of coronary artery bypass graft(s), unspecified, with unspecified angina pectoris: Secondary | ICD-10-CM

## 2014-04-28 DIAGNOSIS — I2510 Atherosclerotic heart disease of native coronary artery without angina pectoris: Secondary | ICD-10-CM

## 2014-04-28 MED ORDER — ISOSORBIDE MONONITRATE ER 30 MG PO TB24: 30.0000 mg | ORAL_TABLET | Freq: Every day | ORAL | Status: DC

## 2014-04-28 MED ORDER — METOPROLOL TARTRATE 25 MG PO TABS: 12.5000 mg | ORAL_TABLET | Freq: Two times a day (BID) | ORAL | Status: DC

## 2014-04-28 NOTE — Patient Instructions (Signed)
Your physician recommends that you schedule a follow-up appointment in: 4-6 weeks. Your physician has recommended you make the following change in your medication:  Continue metoprolol 12.5 mg twice daily. Start isosorbide mononitrate 30 mg daily. Continue all other medications the same.

## 2014-04-28 NOTE — Progress Notes (Signed)
Patient ID: Kim MandrilVirginia J Clardy, female   DOB: 02-10-1966, 48 y.o.   MRN: [id number]    HPI Patient is seen today to follow-up coronary disease. I saw her last March 24, 2014. We decided to try to increase the dose of her metoprolol to see if it would help with the burning sensation she gets with exercise. She does have asthma. I warned her very carefully that this might affect her asthma. She felt the higher dose did affect her asthma and she cut back to predose. She has had this history of burning with exertion in the past. She had undergone catheterization for this in 2014 and her grafts were patent.  Allergies  Allergen Reactions  . Metformin Anaphylaxis  . Metformin And Related Shortness Of Breath  . Penicillins Anaphylaxis    Throat swells difficulty breathing    Current Outpatient Prescriptions  Medication Sig Dispense Refill  . acetaminophen (TYLENOL) 160 MG/5ML solution Take 320 mg by mouth every 6 (six) hours as needed for moderate pain.    Marland Kitchen. albuterol (PROVENTIL) (2.5 MG/3ML) 0.083% nebulizer solution Take 2.5 mg by nebulization every 4 (four) hours as needed. For wheezing.    Marland Kitchen. albuterol (VENTOLIN HFA) 108 (90 BASE) MCG/ACT inhaler Inhale 2 puffs into the lungs every 6 (six) hours as needed for wheezing or shortness of breath.     . ALPRAZolam (XANAX) 0.5 MG tablet TAKE 1 TABLET BY MOUTH TWICE A DAY 60 tablet 2  . amLODipine (NORVASC) 5 MG tablet Take 5 mg by mouth daily.     Marland Kitchen. aspirin 81 MG chewable tablet Chew 81 mg by mouth daily.    . clindamycin (CLEOCIN) 300 MG capsule Take 1 capsule (300 mg total) by mouth 4 (four) times daily. 40 capsule 0  . fish oil-omega-3 fatty acids 1000 MG capsule Take 2 capsules (2 g total) by mouth 2 (two) times daily.    . Fluticasone-Salmeterol (ADVAIR DISKUS) 100-50 MCG/DOSE AEPB Inhale 1 puff into the lungs 2 (two) times daily.     . furosemide (LASIX) 40 MG tablet Take 1 tablet (40 mg total) by mouth daily. 30 tablet 6  . Insulin Human  (INSULIN PUMP) SOLN Inject into the skin continuous. NOVOLOG INSULIN    . lansoprazole (PREVACID SOLUTAB) 30 MG disintegrating tablet Take 30 mg by mouth daily.     Marland Kitchen. levothyroxine (SYNTHROID, LEVOTHROID) 100 MCG tablet Take 100 mcg by mouth daily before breakfast.    . metoprolol tartrate (LOPRESSOR) 25 MG tablet Take 1 tablet (25 mg total) by mouth 2 (two) times daily. (Patient taking differently: Take 12.5 mg by mouth 2 (two) times daily. ) 60 tablet 6  . nitroGLYCERIN (NITROSTAT) 0.4 MG SL tablet Place 0.4 mg under the tongue every 5 (five) minutes as needed for chest pain.    . rosuvastatin (CRESTOR) 20 MG tablet Take 1 tablet (20 mg total) by mouth daily. 90 tablet 3  . traMADol (ULTRAM) 50 MG tablet Take 1 tablet (50 mg total) by mouth every 6 (six) hours as needed. 15 tablet 0   No current facility-administered medications for this visit.    History   Social History  . Marital Status: Single    Spouse Name: N/A    Number of Children: 1  . Years of Education: N/A   Occupational History  . disabilied    Social History Main Topics  . Smoking status: Former Smoker -- 1.00 packs/day for 3 years    Types: Cigarettes    Quit date:  05/22/1993  . Smokeless tobacco: Never Used     Comment:  Year Quit: 1995  . Alcohol Use: No  . Drug Use: No  . Sexual Activity: Yes   Other Topics Concern  . Not on file   Social History Narrative    Family History  Problem Relation Age of Onset  . Colon cancer Neg Hx   . Heart disease Father   . Diabetes Mother     Past Medical History  Diagnosis Date  . Anxiety   . Asthma   . Diabetes mellitus     Scheduled to go on insulin pump  . History of heart attack   . GERD (gastroesophageal reflux disease)   . Esophageal stricture   . CAD (coronary artery disease)     4v CABG, 9/10; NL LVF, status post followup Cardiolite November 2012 no ischemia ejection fraction 65%  . Dyslipidemia      LDL 22 mg percent on Crestor  . Hypothyroidism     . Hypertension   . Hx of CABG     September, 2010  . Ejection fraction     Past Surgical History  Procedure Laterality Date  . Angioplasty      with stenting   . Coronary artery bypass graft  01/26/2009  . Open heart surgery   2010  . Cardiac surgery    . Cesarean section      Patient Active Problem List   Diagnosis Date Noted  . Obesity, unspecified 08/03/2013  . Hx of CABG   . Ejection fraction   . HTN (hypertension) 08/22/2012  . Hypothyroidism 09/29/2011  . Edema 08/25/2011  . Gastroparesis 09/07/2010  . Hyperlipidemia 02/21/2010  . MUSCULOSKELETAL PAIN 04/29/2009  . PALPITATIONS 04/12/2009  . WHEEZING 04/12/2009  . GERD 01/19/2009  . DIABETES MELLITUS 06/12/2007  . ANXIETY 06/12/2007  . Coronary artery disease involving coronary bypass graft of native heart 06/12/2007  . ASTHMA 06/12/2007    ROS  Patient denies fever, chills, headache, sweats, rash, change in vision, change in hearing, cough, nausea vomiting, urinary symptoms. All other systems are reviewed and are negative.  PHYSICAL EXAM Patient is oriented to person time and place. Affect is normal. She is overweight. Head is atraumatic. Sclera and conjunctiva are normal. There is no jugular venous distention. Lungs are clear. Respiratory effort is nonlabored. Exam reveals S1 and S2. Abdomen is soft. There is no peripheral edema.  Filed Vitals:   04/28/14 1439  BP: 121/78  Pulse: 69  Height: 5\' 4"  (1.626 m)  Weight: 222 lb (100.699 kg)  SpO2: 98%     ASSESSMENT & PLAN

## 2014-04-28 NOTE — Assessment & Plan Note (Signed)
She's not had any significant palpitations. No change in therapy.

## 2014-04-28 NOTE — Assessment & Plan Note (Signed)
We had decided to check her lipids. She was not at goal. I encouraged her to increase her Crestor to 20 mg. She did this and she is stable with this.

## 2014-04-28 NOTE — Assessment & Plan Note (Signed)
She still having exertional chest burning. We know that she had this in 2014 when her grafts are patent. Increasing her beta blocker led to increased asthma symptoms. Therefore she cut back to her usual dose. I will try a dose of Imdur to see how she does.

## 2014-05-08 ENCOUNTER — Other Ambulatory Visit (HOSPITAL_COMMUNITY)
Admission: RE | Admit: 2014-05-08 | Discharge: 2014-05-08 | Disposition: A | Discharge status: Home / Self Care | Payer: Medicare Other | Source: Ambulatory Visit | Attending: Obstetrics & Gynecology | Admitting: Obstetrics & Gynecology

## 2014-05-08 ENCOUNTER — Ambulatory Visit (INDEPENDENT_AMBULATORY_CARE_PROVIDER_SITE_OTHER): Payer: Medicare Other | Admitting: Obstetrics & Gynecology

## 2014-05-08 ENCOUNTER — Encounter: Payer: Self-pay | Admitting: Obstetrics & Gynecology

## 2014-05-08 VITALS — BP 120/80 | Ht 64.0 in | Wt 223.0 lb

## 2014-05-08 DIAGNOSIS — Z124 Encounter for screening for malignant neoplasm of cervix: Secondary | ICD-10-CM

## 2014-05-08 DIAGNOSIS — Z01419 Encounter for gynecological examination (general) (routine) without abnormal findings: Secondary | ICD-10-CM

## 2014-05-08 NOTE — Progress Notes (Signed)
Patient ID: Kim MandrilVirginia J Strycharz, female   DOB: 19-Dec-1965, 48 y.o.   MRN: [id number] Subjective:    Chief Complaint  Patient presents with  . gyn visit    pap /physcial     [name] is a 48 y.o. female here for a routine exam.  Patient's last menstrual period was 04/05/2014. No obstetric history on file. Birth Control Method:  BTL Menstrual Calendar(currently): regular  Current complaints: none.   Current acute medical issues:  diabtes ASCVD   Recent Gynecologic History Patient's last menstrual period was 04/05/2014. Last Pap: 2014,  normal Last mammogram: 2013,  normal  Past Medical History  Diagnosis Date  . Anxiety   . Asthma   . Diabetes mellitus     Scheduled to go on insulin pump  . History of heart attack   . GERD (gastroesophageal reflux disease)   . Esophageal stricture   . CAD (coronary artery disease)     4v CABG, 9/10; NL LVF, status post followup Cardiolite November 2012 no ischemia ejection fraction 65%  . Dyslipidemia      LDL 22 mg percent on Crestor  . Hypothyroidism   . Hypertension   . Hx of CABG     September, 2010  . Ejection fraction     Past Surgical History  Procedure Laterality Date  . Angioplasty      with stenting   . Coronary artery bypass graft  01/26/2009  . Open heart surgery   2010  . Cardiac surgery    . Cesarean section      OB History    No data available      History   Social History  . Marital Status: Single    Spouse Name: N/A    Number of Children: 1  . Years of Education: N/A   Occupational History  . disabilied    Social History Main Topics  . Smoking status: Former Smoker -- 1.00 packs/day for 3 years    Types: Cigarettes    Quit date: 05/22/1993  . Smokeless tobacco: Never Used     Comment:  Year Quit: 1995  . Alcohol Use: No  . Drug Use: No  . Sexual Activity: Yes   Other Topics Concern  . None   Social History Narrative    Family History  Problem Relation Age of Onset  . Colon cancer  Neg Hx   . Heart disease Father   . Diabetes Mother     Current outpatient prescriptions: albuterol (PROVENTIL) (2.5 MG/3ML) 0.083% nebulizer solution, Take 2.5 mg by nebulization every 4 (four) hours as needed. For wheezing., Disp: , Rfl: ;  albuterol (VENTOLIN HFA) 108 (90 BASE) MCG/ACT inhaler, Inhale 2 puffs into the lungs every 6 (six) hours as needed for wheezing or shortness of breath. , Disp: , Rfl: ;  ALPRAZolam (XANAX) 0.5 MG tablet, TAKE 1 TABLET BY MOUTH TWICE A DAY, Disp: 60 tablet, Rfl: 2 aspirin 81 MG chewable tablet, Chew 81 mg by mouth daily., Disp: , Rfl: ;  fish oil-omega-3 fatty acids 1000 MG capsule, Take 2 capsules (2 g total) by mouth 2 (two) times daily., Disp: , Rfl: ;  Fluticasone-Salmeterol (ADVAIR DISKUS) 100-50 MCG/DOSE AEPB, Inhale 1 puff into the lungs 2 (two) times daily. , Disp: , Rfl: ;  furosemide (LASIX) 40 MG tablet, Take 1 tablet (40 mg total) by mouth daily., Disp: 30 tablet, Rfl: 6 Insulin Human (INSULIN PUMP) SOLN, Inject into the skin continuous. NOVOLOG INSULIN, Disp: , Rfl: ;  isosorbide mononitrate (IMDUR) 30 MG 24 hr tablet, Take 1 tablet (30 mg total) by mouth daily., Disp: 90 tablet, Rfl: 3;  lansoprazole (PREVACID SOLUTAB) 30 MG disintegrating tablet, Take 30 mg by mouth daily. , Disp: , Rfl: ;  levothyroxine (SYNTHROID, LEVOTHROID) 100 MCG tablet, Take 100 mcg by mouth daily before breakfast., Disp: , Rfl:  metoprolol tartrate (LOPRESSOR) 25 MG tablet, Take 0.5 tablets (12.5 mg total) by mouth 2 (two) times daily., Disp: 60 tablet, Rfl: 6;  nitroGLYCERIN (NITROSTAT) 0.4 MG SL tablet, Place 0.4 mg under the tongue every 5 (five) minutes as needed for chest pain., Disp: , Rfl: ;  rosuvastatin (CRESTOR) 20 MG tablet, Take 1 tablet (20 mg total) by mouth daily., Disp: 90 tablet, Rfl: 3 acetaminophen (TYLENOL) 160 MG/5ML solution, Take 320 mg by mouth every 6 (six) hours as needed for moderate pain., Disp: , Rfl: ;  amLODipine (NORVASC) 5 MG tablet, Take 5 mg by  mouth daily. , Disp: , Rfl: ;  clindamycin (CLEOCIN) 300 MG capsule, Take 1 capsule (300 mg total) by mouth 4 (four) times daily. (Patient not taking: Reported on 05/08/2014), Disp: 40 capsule, Rfl: 0 traMADol (ULTRAM) 50 MG tablet, Take 1 tablet (50 mg total) by mouth every 6 (six) hours as needed. (Patient not taking: Reported on 05/08/2014), Disp: 15 tablet, Rfl: 0  Review of Systems  Review of Systems  Constitutional: Negative for fever, chills, weight loss, malaise/fatigue and diaphoresis.  HENT: Negative for hearing loss, ear pain, nosebleeds, congestion, sore throat, neck pain, tinnitus and ear discharge.   Eyes: Negative for blurred vision, double vision, photophobia, pain, discharge and redness.  Respiratory: Negative for cough, hemoptysis, sputum production, shortness of breath, wheezing and stridor.   Cardiovascular: Negative for chest pain, palpitations, orthopnea, claudication, leg swelling and PND.  Gastrointestinal: negative for abdominal pain. Negative for heartburn, nausea, vomiting, diarrhea, constipation, blood in stool and melena.  Genitourinary: Negative for dysuria, urgency, frequency, hematuria and flank pain.  Musculoskeletal: Negative for myalgias, back pain, joint pain and falls.  Skin: Negative for itching and rash.  Neurological: Negative for dizziness, tingling, tremors, sensory change, speech change, focal weakness, seizures, loss of consciousness, weakness and headaches.  Endo/Heme/Allergies: Negative for environmental allergies and polydipsia. Does not bruise/bleed easily.  Psychiatric/Behavioral: Negative for depression, suicidal ideas, hallucinations, memory loss and substance abuse. The patient is not nervous/anxious and does not have insomnia.        Objective:  Blood pressure 120/80, height 5\' 4"  (1.626 m), weight 223 lb (101.152 kg), last menstrual period 04/05/2014.   Physical Exam  Vitals reviewed. Constitutional: She is oriented to person, place, and  time. She appears well-developed and well-nourished.  HENT:  Head: Normocephalic and atraumatic.        Right Ear: External ear normal.  Left Ear: External ear normal.  Nose: Nose normal.  Mouth/Throat: Oropharynx is clear and moist.  Eyes: Conjunctivae and EOM are normal. Pupils are equal, round, and reactive to light. Right eye exhibits no discharge. Left eye exhibits no discharge. No scleral icterus.  Neck: Normal range of motion. Neck supple. No tracheal deviation present. No thyromegaly present.  Cardiovascular: Normal rate, regular rhythm, normal heart sounds and intact distal pulses.  Exam reveals no gallop and no friction rub.   No murmur heard. Respiratory: Effort normal and breath sounds normal. No respiratory distress. She has no wheezes. She has no rales. She exhibits no tenderness.  GI: Soft. Bowel sounds are normal. She exhibits no distension and no mass.  There is no tenderness. There is no rebound and no guarding.  Genitourinary:  Breasts no masses skin changes or nipple changes bilaterally      Vulva is normal without lesions Vagina is pink moist without discharge Cervix normal in appearance and pap is done Uterus is normal size shape and contour Adnexa is negative with normal sized ovaries   Musculoskeletal: Normal range of motion. She exhibits no edema and no tenderness.  Neurological: She is alert and oriented to person, place, and time. She has normal reflexes. She displays normal reflexes. No cranial nerve deficit. She exhibits normal muscle tone. Coordination normal.  Skin: Skin is warm and dry. No rash noted. No erythema. No pallor.  Psychiatric: She has a normal mood and affect. Her behavior is normal. Judgment and thought content normal.       Assessment:     Normal Gyn exam      Plan:    Mammogram ordered. Follow up in: 1 year.

## 2014-05-11 LAB — CYTOLOGY - PAP

## 2014-05-31 ENCOUNTER — Other Ambulatory Visit: Payer: Self-pay | Admitting: Gastroenterology

## 2014-06-02 ENCOUNTER — Ambulatory Visit (INDEPENDENT_AMBULATORY_CARE_PROVIDER_SITE_OTHER): Payer: Medicare Other | Admitting: Cardiology

## 2014-06-02 ENCOUNTER — Encounter: Payer: Self-pay | Admitting: Cardiology

## 2014-06-02 VITALS — BP 122/68 | HR 67 | Ht 64.0 in | Wt 222.0 lb

## 2014-06-02 DIAGNOSIS — I10 Essential (primary) hypertension: Secondary | ICD-10-CM

## 2014-06-02 DIAGNOSIS — I25810 Atherosclerosis of coronary artery bypass graft(s) without angina pectoris: Secondary | ICD-10-CM

## 2014-06-02 DIAGNOSIS — E785 Hyperlipidemia, unspecified: Secondary | ICD-10-CM

## 2014-06-02 MED ORDER — ISOSORBIDE MONONITRATE ER 60 MG PO TB24: 60.0000 mg | ORAL_TABLET | Freq: Every day | ORAL | Status: DC

## 2014-06-02 NOTE — Assessment & Plan Note (Signed)
Blood pressures controlled. No change in therapy. 

## 2014-06-02 NOTE — Progress Notes (Signed)
Patient ID: [name], female   DOB: [birth year], 49 y.o.   MRN: [id number]    HPI Patient is seen today to follow up coronary disease. She's been having some chest burning with exertion. We tried a small dose of metoprolol and it made her asthma worse and it was stopped. When I saw her on April 28, 2014, we added a low dose of Imdur 30 mg. This has definitely helped with her symptoms. She is tolerating it without significant headaches.  Allergies  Allergen Reactions  . Metformin Anaphylaxis  . Metformin And Related Shortness Of Breath  . Penicillins Anaphylaxis    Throat swells difficulty breathing    Current Outpatient Prescriptions  Medication Sig Dispense Refill  . acetaminophen (TYLENOL) 160 MG/5ML solution Take 320 mg by mouth every 6 (six) hours as needed for moderate pain.    Marland Kitchen albuterol (PROVENTIL) (2.5 MG/3ML) 0.083% nebulizer solution Take 2.5 mg by nebulization every 4 (four) hours as needed. For wheezing.    Marland Kitchen albuterol (VENTOLIN HFA) 108 (90 BASE) MCG/ACT inhaler Inhale 2 puffs into the lungs every 6 (six) hours as needed for wheezing or shortness of breath.     . ALPRAZolam (XANAX) 0.5 MG tablet TAKE 1 TABLET BY MOUTH TWICE A DAY 60 tablet 2  . amLODipine (NORVASC) 5 MG tablet Take 5 mg by mouth daily.     Marland Kitchen aspirin 81 MG chewable tablet Chew 81 mg by mouth daily.    . fish oil-omega-3 fatty acids 1000 MG capsule Take 2 capsules (2 g total) by mouth 2 (two) times daily.    . Fluticasone-Salmeterol (ADVAIR DISKUS) 100-50 MCG/DOSE AEPB Inhale 1 puff into the lungs 2 (two) times daily.     . furosemide (LASIX) 40 MG tablet Take 1 tablet (40 mg total) by mouth daily. 30 tablet 6  . Insulin Human (INSULIN PUMP) SOLN Inject into the skin continuous. NOVOLOG INSULIN    . isosorbide mononitrate (IMDUR) 30 MG 24 hr tablet Take 1 tablet (30 mg total) by mouth daily. 90 tablet 3  . lansoprazole (PREVACID SOLUTAB) 30 MG disintegrating tablet Take 30 mg by mouth daily.     Marland Kitchen  levothyroxine (SYNTHROID, LEVOTHROID) 100 MCG tablet Take 100 mcg by mouth daily before breakfast.    . metoprolol tartrate (LOPRESSOR) 25 MG tablet Take 0.5 tablets (12.5 mg total) by mouth 2 (two) times daily. 60 tablet 6  . nitroGLYCERIN (NITROSTAT) 0.4 MG SL tablet Place 0.4 mg under the tongue every 5 (five) minutes as needed for chest pain.    Marland Kitchen PREVACID SOLUTAB 30 MG disintegrating tablet TAKE 1 TABLET (30 MG TOTAL) BY MOUTH DAILY. 60 tablet 2  . rosuvastatin (CRESTOR) 20 MG tablet Take 1 tablet (20 mg total) by mouth daily. 90 tablet 3  . traMADol (ULTRAM) 50 MG tablet Take 1 tablet (50 mg total) by mouth every 6 (six) hours as needed. 15 tablet 0   No current facility-administered medications for this visit.    History   Social History  . Marital Status: Single    Spouse Name: N/A    Number of Children: 1  . Years of Education: N/A   Occupational History  . disabilied    Social History Main Topics  . Smoking status: Former Smoker -- 1.00 packs/day for 3 years    Types: Cigarettes    Quit date: 05/22/1993  . Smokeless tobacco: Never Used     Comment:  Year Quit: 1995  . Alcohol Use: No  .  Drug Use: No  . Sexual Activity: Yes   Other Topics Concern  . Not on file   Social History Narrative    Family History  Problem Relation Age of Onset  . Colon cancer Neg Hx   . Heart disease Father   . Diabetes Mother     Past Medical History  Diagnosis Date  . Anxiety   . Asthma   . Diabetes mellitus     Scheduled to go on insulin pump  . History of heart attack   . GERD (gastroesophageal reflux disease)   . Esophageal stricture   . CAD (coronary artery disease)     4v CABG, 9/10; NL LVF, status post followup Cardiolite November 2012 no ischemia ejection fraction 65%  . Dyslipidemia      LDL 22 mg percent on Crestor  . Hypothyroidism   . Hypertension   . Hx of CABG     September, 2010  . Ejection fraction     Past Surgical History  Procedure Laterality Date    . Angioplasty      with stenting   . Coronary artery bypass graft  01/26/2009  . Open heart surgery   2010  . Cardiac surgery    . Cesarean section      Patient Active Problem List   Diagnosis Date Noted  . Obesity, unspecified 08/03/2013  . Hx of CABG   . Ejection fraction   . HTN (hypertension) 08/22/2012  . Hypothyroidism 09/29/2011  . Edema 08/25/2011  . Gastroparesis 09/07/2010  . Hyperlipidemia 02/21/2010  . MUSCULOSKELETAL PAIN 04/29/2009  . PALPITATIONS 04/12/2009  . WHEEZING 04/12/2009  . GERD 01/19/2009  . DIABETES MELLITUS 06/12/2007  . ANXIETY 06/12/2007  . Coronary artery disease involving coronary bypass graft of native heart 06/12/2007  . ASTHMA 06/12/2007    ROS  Patient denies fever, chills, headache, sweats, rash, change in vision, change in hearing, chest pain, cough, nausea or vomiting, urinary symptoms. All other systems are reviewed and are negative.  PHYSICAL EXAM Patient is overweight but stable. She is oriented to person time and place. Affect is normal. Head is atraumatic. Sclera and conjunctiva are normal. There is no jugular venous distention. Lungs are clear. Respiratory effort is nonlabored. Cardiac exam reveals S1 and S2. Abdomen is soft. There is no peripheral edema.  Filed Vitals:   06/02/14 1329  BP: 122/68  Pulse: 67  Height: 5\' 4"  (1.626 m)  Weight: 222 lb (100.699 kg)  SpO2: 97%    EKG is done today and reviewed by me. There is sinus rhythm. T-wave in lead V2 is inverted. There is no change from the tracing of January, 2015.  ASSESSMENT & PLAN

## 2014-06-02 NOTE — Assessment & Plan Note (Signed)
The patient had some aches and pains in the past. However I was able to convince her to increase her Crestor to 20 mg. She is now tolerating this. I will consider seeing if we want to push her dose higher when I see her next.

## 2014-06-02 NOTE — Assessment & Plan Note (Signed)
Patient is having less chest burning with with the addition of Imdur. She did have chest burning and 2014 when her cath showed that her grafts were patent. She's on a small dose of Imdur. I will increase the dose to 60 mg daily this even helps even more.

## 2014-06-02 NOTE — Patient Instructions (Signed)
Your physician recommends that you schedule a follow-up appointment in: 3 months. You will receive a reminder letter in the mail in about 1-2 months reminding you to call and schedule your appointment. If you don't receive this letter, please contact our office. Your physician has recommended you make the following change in your medication:  Increase your isosorbide mononitrate to 60 mg daily. You may take (2) of your 30 mg tablets daily until they are finished.  Continue all other medications the same.

## 2014-06-09 ENCOUNTER — Telehealth: Payer: Self-pay | Admitting: Gastroenterology

## 2014-06-09 NOTE — Telephone Encounter (Signed)
Left message to call back. Would like to get her scheduled for re-evaluation. Last seen in 2014.

## 2014-06-09 NOTE — Telephone Encounter (Signed)
Spoke with the patient. She is off any medication for gastroparesis and has been for many months. She agrees to an appointment for evaluation. She will avoid red meat and high fat foods for now.

## 2014-06-10 ENCOUNTER — Encounter (INDEPENDENT_AMBULATORY_CARE_PROVIDER_SITE_OTHER): Payer: Medicare Other | Admitting: Ophthalmology

## 2014-06-10 DIAGNOSIS — H35033 Hypertensive retinopathy, bilateral: Secondary | ICD-10-CM

## 2014-06-10 DIAGNOSIS — E10339 Type 1 diabetes mellitus with moderate nonproliferative diabetic retinopathy without macular edema: Secondary | ICD-10-CM

## 2014-06-10 DIAGNOSIS — E10359 Type 1 diabetes mellitus with proliferative diabetic retinopathy without macular edema: Secondary | ICD-10-CM

## 2014-06-10 DIAGNOSIS — E10319 Type 1 diabetes mellitus with unspecified diabetic retinopathy without macular edema: Secondary | ICD-10-CM

## 2014-06-10 DIAGNOSIS — H2513 Age-related nuclear cataract, bilateral: Secondary | ICD-10-CM

## 2014-06-10 DIAGNOSIS — I10 Essential (primary) hypertension: Secondary | ICD-10-CM

## 2014-06-10 DIAGNOSIS — H43813 Vitreous degeneration, bilateral: Secondary | ICD-10-CM

## 2014-06-12 ENCOUNTER — Ambulatory Visit: Payer: Medicare Other | Admitting: Nurse Practitioner

## 2014-06-15 ENCOUNTER — Other Ambulatory Visit: Payer: Self-pay | Admitting: *Deleted

## 2014-06-15 MED ORDER — FUROSEMIDE 40 MG PO TABS: 40.0000 mg | ORAL_TABLET | Freq: Every day | ORAL | Status: DC

## 2014-06-17 ENCOUNTER — Other Ambulatory Visit (INDEPENDENT_AMBULATORY_CARE_PROVIDER_SITE_OTHER): Payer: Medicare Other

## 2014-06-17 ENCOUNTER — Encounter: Payer: Self-pay | Admitting: Physician Assistant

## 2014-06-17 ENCOUNTER — Ambulatory Visit (INDEPENDENT_AMBULATORY_CARE_PROVIDER_SITE_OTHER): Payer: Medicare Other | Admitting: Physician Assistant

## 2014-06-17 VITALS — BP 105/68 | HR 83 | Ht 64.0 in | Wt 218.4 lb

## 2014-06-17 DIAGNOSIS — E119 Type 2 diabetes mellitus without complications: Secondary | ICD-10-CM

## 2014-06-17 DIAGNOSIS — I25810 Atherosclerosis of coronary artery bypass graft(s) without angina pectoris: Secondary | ICD-10-CM

## 2014-06-17 DIAGNOSIS — K3184 Gastroparesis: Secondary | ICD-10-CM

## 2014-06-17 LAB — HEMOGLOBIN A1C: Hgb A1c MFr Bld: 9.3 % — ABNORMAL HIGH (ref 4.6–6.5)

## 2014-06-17 MED ORDER — METRONIDAZOLE 500 MG PO TABS: 500.0000 mg | ORAL_TABLET | Freq: Three times a day (TID) | ORAL | Status: DC

## 2014-06-17 MED ORDER — LANSOPRAZOLE 30 MG PO TBDP: ORAL_TABLET | ORAL | Status: DC

## 2014-06-17 NOTE — Patient Instructions (Addendum)
Gastroparesis  Gastroparesis is also called slowed stomach emptying (delayed gastric emptying). It is a condition in which the stomach takes too long to empty its contents. It often happens in people with diabetes.  CAUSES  Gastroparesis happens when nerves to the stomach are damaged or stop working. When the nerves are damaged, the muscles of the stomach and intestines do not work normally. The movement of food is slowed or stopped. High blood glucose (sugar) causes changes in nerves and can damage the blood vessels that carry oxygen and nutrients to the nerves. RISK FACTORS  Diabetes.  Post-viral syndromes.  Eating disorders (anorexia, bulimia).  Surgery on the stomach or vagus nerve.  Gastroesophageal reflux disease (rarely).  Smooth muscle disorders (amyloidosis, scleroderma).  Metabolic disorders, including hypothyroidism.  Parkinson disease. SYMPTOMS   Heartburn.  Feeling sick to your stomach (nausea).  Vomiting of undigested food.  An early feeling of fullness when eating.  Weight loss.  Abdominal bloating.  Erratic blood glucose levels.  Lack of appetite.  Gastroesophageal reflux.  Spasms of the stomach wall. Complications can include:  Bacterial overgrowth in stomach. Food stays in the stomach and can ferment and cause bacteria to grow.  Weight loss due to difficulty digesting and absorbing nutrients.  Vomiting.  Obstruction in the stomach. Undigested food can harden and cause nausea and vomiting.  Blood glucose fluctuations caused by inconsistent food absorption. DIAGNOSIS  The diagnosis of gastroparesis is confirmed through one or more of the following tests:  Barium X-rays and scans. These tests look at how long it takes for food to move through the stomach.  Gastric manometry. This test measures electrical and muscular activity in the stomach. A thin tube is passed down the throat into the stomach. The tube contains a wire that takes measurements  of the stomach's electrical and muscular activity as it digests liquids and solid food.  Endoscopy. This procedure is done with a long, thin tube called an endoscope. It is passed through the mouth and gently down the esophagus into the stomach. This tube helps the caregiver look at the lining of the stomach to check for any abnormalities.  Ultrasonography. This can rule out gallbladder disease or pancreatitis. This test will outline and define the shape of the gallbladder and pancreas. TREATMENT   Treatments may include:  Exercise.  Medicines to control nausea and vomiting.  Medicines to stimulate stomach muscles.  Changes in what and when you eat.  Having smaller meals more often.  Eating low-fiber forms of high-fiber foods, such as eating cooked vegetables instead of raw vegetables.  Eating low-fat foods.  Consuming liquids, which are easier to digest.  In severe cases, feeding tubes and intravenous (IV) feeding may be needed. It is important to note that in most cases, treatment does not cure gastroparesis. It is usually a lasting (chronic) condition. Treatment helps you manage the underlying condition so that you can be as healthy and comfortable as possible. Other treatments  A gastric neurostimulator has been developed to assist people with gastroparesis. The battery-operated device is surgically implanted. It emits mild electrical pulses to help improve stomach emptying and to control nausea and vomiting.  The use of botulinum toxin has been shown to improve stomach emptying by decreasing the prolonged contractions of the muscle between the stomach and the small intestine (pyloric sphincter). The benefits are temporary. SEEK MEDICAL CARE IF:   You have diabetes and you are having problems keeping your blood glucose in goal range.  You are having nausea,   vomiting, bloating, or early feelings of fullness with eating.  Your symptoms do not change with a change in  diet. Document Released: 05/08/2005 Document Revised: 09/02/2012 Document Reviewed: 10/15/2008 [facility] Patient Information 2015 [employer] [place]. This information is not intended to replace advice given to you by your health care provider. Make sure you discuss any questions you have with your health care provider.  Follow up in 6 weeks, you will need to call back for that appointment We have sent in your prescriptions to your pharmacy Gastroparesis diet given Follow up with Lawson FiscalLori in 6 weeks- You will need to call back for that appointment

## 2014-06-18 ENCOUNTER — Encounter: Payer: Self-pay | Admitting: Physician Assistant

## 2014-06-18 NOTE — Progress Notes (Signed)
I would resume domperidone

## 2014-06-18 NOTE — Progress Notes (Signed)
Patient ID: [name], female   DOB: 07/17/1965, 49 y.o.   MRN: [id number]     History of Present Illness:   [name] is a 49 year old female with a history of insulin-dependent diabetes, GERD, and gastroparesis who was last seen here in 2014. She has previously been followed by Dr. Arlyce Dice. She is here today with complaints of nausea and bloating. She states her nausea is worse after meals but not accompanied with vomiting. She feels excessively gassy and bloated. She has no abdominal pain. Her bowel movements are formed and occur on a daily basis. She reports that her sugars are running high and as far as she knows her last A1c several months ago was 8. She had been on domperidone for her gastroparesis with excellent results but has been off of it for approximately a year and a half. She has been unable to tolerate Reglan in the past because it caused nightmares. She is not having heartburn or dysphagia at this time.   Past Medical History  Diagnosis Date  . Anxiety   . Asthma   . Diabetes mellitus     Scheduled to go on insulin pump  . History of heart attack   . GERD (gastroesophageal reflux disease)   . Esophageal stricture   . CAD (coronary artery disease)     4v CABG, 9/10; NL LVF, status post followup Cardiolite November 2012 no ischemia ejection fraction 65%  . Dyslipidemia      LDL 22 mg percent on Crestor  . Hypothyroidism   . Hypertension   . Hx of CABG     September, 2010  . Ejection fraction     Past Surgical History  Procedure Laterality Date  . Angioplasty      with stenting   . Coronary artery bypass graft  01/26/2009  . Open heart surgery   2010  . Cardiac surgery    . Cesarean section    . Eye surgery     Family History  Problem Relation Age of Onset  . Colon cancer Neg Hx   . Heart disease Father   . Diabetes Mother    History  Substance Use Topics  . Smoking status: Former Smoker -- 1.00 packs/day for 3 years    Types: Cigarettes    Quit date:  05/22/1993  . Smokeless tobacco: Never Used     Comment:  Year Quit: 1995  . Alcohol Use: No   Current Outpatient Prescriptions  Medication Sig Dispense Refill  . albuterol (PROVENTIL) (2.5 MG/3ML) 0.083% nebulizer solution Take 2.5 mg by nebulization every 4 (four) hours as needed. For wheezing.    Marland Kitchen albuterol (VENTOLIN HFA) 108 (90 BASE) MCG/ACT inhaler Inhale 2 puffs into the lungs every 6 (six) hours as needed for wheezing or shortness of breath.     . ALPRAZolam (XANAX) 0.5 MG tablet TAKE 1 TABLET BY MOUTH TWICE A DAY 60 tablet 2  . amLODipine (NORVASC) 5 MG tablet Take 5 mg by mouth daily.     Marland Kitchen aspirin 81 MG chewable tablet Chew 81 mg by mouth daily.    . fish oil-omega-3 fatty acids 1000 MG capsule Take 2 capsules (2 g total) by mouth 2 (two) times daily.    . Fluticasone-Salmeterol (ADVAIR DISKUS) 100-50 MCG/DOSE AEPB Inhale 1 puff into the lungs 2 (two) times daily.     . furosemide (LASIX) 40 MG tablet Take 1 tablet (40 mg total) by mouth daily. 30 tablet 6  . Insulin Human (INSULIN PUMP)  SOLN Inject into the skin continuous. NOVOLOG INSULIN    . isosorbide mononitrate (IMDUR) 60 MG 24 hr tablet Take 1 tablet (60 mg total) by mouth daily. 90 tablet 3  . lansoprazole (PREVACID SOLUTAB) 30 MG disintegrating tablet Take 30 mg by mouth daily.     . lansoprazole (PREVACID SOLUTAB) 30 MG disintegrating tablet 30 mg twice a day 30 minutes before breakfast and 30 minutes before supper 60 tablet 11  . levothyroxine (SYNTHROID, LEVOTHROID) 100 MCG tablet Take 100 mcg by mouth daily before breakfast.    . metoprolol tartrate (LOPRESSOR) 25 MG tablet Take 0.5 tablets (12.5 mg total) by mouth 2 (two) times daily. 60 tablet 6  . nitroGLYCERIN (NITROSTAT) 0.4 MG SL tablet Place 0.4 mg under the tongue every 5 (five) minutes as needed for chest pain.    . prednisoLONE acetate (PRED FORTE) 1 % ophthalmic suspension   2  . rosuvastatin (CRESTOR) 20 MG tablet Take 1 tablet (20 mg total) by mouth  daily. 90 tablet 3  . metroNIDAZOLE (FLAGYL) 500 MG tablet Take 1 tablet (500 mg total) by mouth 3 (three) times daily. 30 tablet 0   No current facility-administered medications for this visit.   Allergies  Allergen Reactions  . Metformin Anaphylaxis  . Metformin And Related Shortness Of Breath  . Penicillins Anaphylaxis    Throat swells difficulty breathing      Review of Systems: Gen: Denies any fever, chills, sweats, anorexia, fatigue, weakness, malaise, weight loss, and sleep disorder CV: Denies chest pain, angina, palpitations, syncope, orthopnea, PND, peripheral edema, and claudication. Resp: Denies dyspnea at rest, dyspnea with exercise, cough, sputum, wheezing, coughing up blood, and pleurisy. GI: Denies vomiting blood, jaundice, and fecal incontinence.   Denies dysphagia or odynophagia. GU : Denies urinary burning, blood in urine, urinary frequency, urinary hesitancy, nocturnal urination, and urinary incontinence. MS: Denies joint pain, limitation of movement, and swelling, stiffness, low back pain, extremity pain. Denies muscle weakness, cramps, atrophy.  Derm: Denies rash, itching, dry skin, hives, moles, warts, or unhealing ulcers.  Psych: Denies depression, anxiety, memory loss, suicidal ideation, hallucinations, paranoia, and confusion. Heme: Denies bruising, bleeding, and enlarged lymph nodes. Neuro:  Denies any headaches, dizziness, paresthesia Endo:  Denies any problems with DM, thyroid, adrenal    Physical Exam: General: Pleasant, well developed female in no acute distress Head: Normocephalic and atraumatic Eyes:  sclerae anicteric, conjunctiva pink  Ears: Normal auditory acuity Lungs: Clear throughout to auscultation Heart: Regular rate and rhythm Abdomen: Soft, non distended, non-tender. No masses, no hepatomegaly. Normal bowel sounds Musculoskeletal: Symmetrical with no gross deformities  Extremities: No edema  Neurological: Alert oriented x 4, grossly  nonfocal Psychological:  Alert and cooperative. Normal mood and affect  Assessment and Recommendations: 49 year old female with a known history of GERD and gastroparesis here with worsening nausea. A gastroparesis diet has been reviewed. She will continue to use her Prevacid solid tabs 30 mg twice a day. As she has been complaining of increased gas and bloating, we will empirically give her a trial of Flagyl 500 mg 3 times a day for 10 days for possible actuarial overgrowth secondary to diminished motility. Patient states she has difficulty swallowing some of her pills. I have spoken to the pharmacist at [facility] outpatient pharmacy and was advised that the Flagyl immediate release tablets can be broken into pieces and swallowed with applesauce. A hemoglobin A1c will be obtained today. Patient will follow up in 6 weeks, but has been advised to call  sooner if she does not have any improvement of her symptoms. She may be a candidate for reinitiation of domperidone.    [name], Moise BoringLori P PA-C 06/18/2014,

## 2014-06-22 ENCOUNTER — Other Ambulatory Visit: Payer: Self-pay | Admitting: Gastroenterology

## 2014-08-07 ENCOUNTER — Ambulatory Visit (INDEPENDENT_AMBULATORY_CARE_PROVIDER_SITE_OTHER): Payer: Medicare Other | Admitting: Physician Assistant

## 2014-08-07 ENCOUNTER — Encounter: Payer: Self-pay | Admitting: Physician Assistant

## 2014-08-07 VITALS — BP 126/60 | HR 68 | Ht 64.0 in | Wt 219.1 lb

## 2014-08-07 DIAGNOSIS — K3184 Gastroparesis: Secondary | ICD-10-CM

## 2014-08-07 DIAGNOSIS — K219 Gastro-esophageal reflux disease without esophagitis: Secondary | ICD-10-CM

## 2014-08-07 DIAGNOSIS — I25810 Atherosclerosis of coronary artery bypass graft(s) without angina pectoris: Secondary | ICD-10-CM

## 2014-08-07 NOTE — Patient Instructions (Signed)
Please continue Prevacid  Please follow up with Dr. Arlyce DiceKaplan or Lawson FiscalLori in 6 months

## 2014-08-07 NOTE — Progress Notes (Signed)
Patient ID: [name], female   DOB: [birth year], 49 y.o.   MRN: [id number]     History of Present Illness: [name] is a delightful 49 year old female known to [name] with a history of GERD and gastroparesis. She has a history of insulin-dependent diabetes, anxiety, asthma, MI, esophageal stricture, dyslipidemia, hypothyroidism, and is status post a CABG in September 2010. She was last seen here in January 2016 at which time she was complaining of postprandial nausea without vomiting, frequent gas and bloating. She was given a trial of Flagyl 500 mg 3 times a day for 10 days for possible bacterial overgrowth due to diminished motility. She returns today in follow-up and states she feels great. She reports that after 4 days of the Flagyl she had no further bloating or gas and her nausea was significantly diminished. Her bowel movements have been regular.   Past Medical History  Diagnosis Date  . Anxiety   . Asthma   . Diabetes mellitus     Scheduled to go on insulin pump  . History of heart attack   . GERD (gastroesophageal reflux disease)   . Esophageal stricture   . CAD (coronary artery disease)     4v CABG, 9/10; NL LVF, status post followup Cardiolite November 2012 no ischemia ejection fraction 65%  . Dyslipidemia      LDL 22 mg percent on Crestor  . Hypothyroidism   . Hypertension   . Hx of CABG     September, 2010  . Ejection fraction     Past Surgical History  Procedure Laterality Date  . Angioplasty      with stenting   . Coronary artery bypass graft  01/26/2009  . Open heart surgery   2010  . Cardiac surgery    . Cesarean section    . Eye surgery     Family History  Problem Relation Age of Onset  . Colon cancer Neg Hx   . Heart disease Father   . Diabetes Mother    History  Substance Use Topics  . Smoking status: Former Smoker -- 1.00 packs/day for 3 years    Types: Cigarettes    Quit date: 05/22/1993  . Smokeless tobacco: Never Used   Comment:  Year Quit: 1995  . Alcohol Use: No   Current Outpatient Prescriptions  Medication Sig Dispense Refill  . albuterol (PROVENTIL) (2.5 MG/3ML) 0.083% nebulizer solution Take 2.5 mg by nebulization every 4 (four) hours as needed. For wheezing.    Marland Kitchen albuterol (VENTOLIN HFA) 108 (90 BASE) MCG/ACT inhaler Inhale 2 puffs into the lungs every 6 (six) hours as needed for wheezing or shortness of breath.     . ALPRAZolam (XANAX) 0.5 MG tablet TAKE 1 TABLET BY MOUTH TWICE A DAY 60 tablet 2  . amLODipine (NORVASC) 5 MG tablet Take 5 mg by mouth daily.     Marland Kitchen aspirin 81 MG chewable tablet Chew 81 mg by mouth daily.    . fish oil-omega-3 fatty acids 1000 MG capsule Take 2 capsules (2 g total) by mouth 2 (two) times daily.    . Fluticasone-Salmeterol (ADVAIR DISKUS) 100-50 MCG/DOSE AEPB Inhale 1 puff into the lungs 2 (two) times daily.     . furosemide (LASIX) 40 MG tablet Take 1 tablet (40 mg total) by mouth daily. 30 tablet 6  . Insulin Human (INSULIN PUMP) SOLN Inject into the skin continuous. NOVOLOG INSULIN    . isosorbide mononitrate (IMDUR) 60 MG 24 hr tablet Take 1  tablet (60 mg total) by mouth daily. 90 tablet 3  . lansoprazole (PREVACID SOLUTAB) 30 MG disintegrating tablet 30 mg twice a day 30 minutes before breakfast and 30 minutes before supper 60 tablet 11  . levothyroxine (SYNTHROID, LEVOTHROID) 100 MCG tablet Take 100 mcg by mouth daily before breakfast.    . metoprolol tartrate (LOPRESSOR) 25 MG tablet Take 0.5 tablets (12.5 mg total) by mouth 2 (two) times daily. 60 tablet 6  . nitroGLYCERIN (NITROSTAT) 0.4 MG SL tablet Place 0.4 mg under the tongue every 5 (five) minutes as needed for chest pain.    . prednisoLONE acetate (PRED FORTE) 1 % ophthalmic suspension   2  . rosuvastatin (CRESTOR) 20 MG tablet Take 1 tablet (20 mg total) by mouth daily. 90 tablet 3  . tiZANidine (ZANAFLEX) 4 MG tablet Take 1/2 to 1 tablet at bedtime as needed for spasm    . TRUETEST TEST test strip 1 each 5  (five) times daily as needed.   5   No current facility-administered medications for this visit.   Allergies  Allergen Reactions  . Metformin Anaphylaxis  . Metformin And Related Shortness Of Breath  . Penicillins Anaphylaxis    Throat swells difficulty breathing      Review of Systems: Gen: Denies any fever, chills, sweats, anorexia, fatigue, weakness, malaise, weight loss, and sleep disorder CV: Denies chest pain, angina, palpitations, syncope, orthopnea, PND, peripheral edema, and claudication. Resp: Denies dyspnea at rest, dyspnea with exercise, cough, sputum, wheezing, coughing up blood, and pleurisy. GI: Denies vomiting blood, jaundice, and fecal incontinence.   Denies dysphagia or odynophagia. GU : Denies urinary burning, blood in urine, urinary frequency, urinary hesitancy, nocturnal urination, and urinary incontinence. MS: Denies joint pain, limitation of movement, and swelling, stiffness, low back pain, extremity pain. Denies muscle weakness, cramps, atrophy.  Derm: Denies rash, itching, dry skin, hives, moles, warts, or unhealing ulcers.  Psych: Denies depression, anxiety, memory loss, suicidal ideation, hallucinations, paranoia, and confusion. Heme: Denies bruising, bleeding, and enlarged lymph nodes. Neuro:  Denies any headaches, dizziness, paresthesia Endo:  Denies any problems with DM, thyroid, adrenal  LAB RESULTS: Blood work on 06/17/2014 revealed a hemoglobin A1c of 9.3.    Physical Exam: General: Pleasant, well developed female in no acute distress Head: Normocephalic and atraumatic Eyes:  sclerae anicteric, conjunctiva pink  Ears: Normal auditory acuity Lungs: Clear throughout to auscultation Heart: Regular rate and rhythm Abdomen: Soft, non distended, non-tender. No masses, no hepatomegaly. Normal bowel sounds Musculoskeletal: Symmetrical with no gross deformities  Extremities: No edema  Neurological: Alert oriented x 4, grossly nonfocal Psychological:   Alert and cooperative. Normal mood and affect  Assessment and Recommendations: 49 year old female with a known history of GERD and gastroparesis status post recent episode of nausea, bloating, and gas here for follow-up. Her symptoms were relieved with a trial of Flagyl 500 mg 3 times a day for 10 days. Her symptoms may have been due to some small bowel intestinal overgrowth secondary to her dysmotility. She will continue Prevacid for her GERD and will continue to adhere to a gastroparesis diet. She will follow up in 6 months, sooner if needed.     Amier Hoyt, Moise BoringLori P PA-C 08/07/2014,

## 2014-08-07 NOTE — Progress Notes (Signed)
Reviewed and agree with management.  She may be a candidate for a gastroparesis study.  I will contact research. Barbette Hairobert D. Arlyce DiceKaplan, M.D., [facility]

## 2014-08-10 NOTE — Progress Notes (Signed)
Reviewed and agree with management.  Will have research evaluate for gastroparesis trial Barbette HairRobert D. Arlyce DiceKaplan, M.D., [facility]

## 2014-08-20 ENCOUNTER — Ambulatory Visit (INDEPENDENT_AMBULATORY_CARE_PROVIDER_SITE_OTHER): Payer: Medicare Other | Admitting: Cardiology

## 2014-08-20 ENCOUNTER — Encounter: Payer: Self-pay | Admitting: Cardiology

## 2014-08-20 VITALS — BP 110/67 | HR 62 | Ht 64.0 in | Wt 217.8 lb

## 2014-08-20 DIAGNOSIS — I25810 Atherosclerosis of coronary artery bypass graft(s) without angina pectoris: Secondary | ICD-10-CM

## 2014-08-20 DIAGNOSIS — I25708 Atherosclerosis of coronary artery bypass graft(s), unspecified, with other forms of angina pectoris: Secondary | ICD-10-CM

## 2014-08-20 DIAGNOSIS — M62838 Other muscle spasm: Secondary | ICD-10-CM | POA: Diagnosis not present

## 2014-08-20 DIAGNOSIS — R0789 Other chest pain: Secondary | ICD-10-CM | POA: Diagnosis not present

## 2014-08-20 MED ORDER — ISOSORBIDE MONONITRATE ER 60 MG PO TB24: 90.0000 mg | ORAL_TABLET | Freq: Every day | ORAL | Status: DC

## 2014-08-20 MED ORDER — ISOSORBIDE MONONITRATE ER 60 MG PO TB24: 60.0000 mg | ORAL_TABLET | Freq: Every day | ORAL | Status: DC

## 2014-08-20 MED ORDER — TIZANIDINE HCL 4 MG PO TABS: 2.0000 mg | ORAL_TABLET | Freq: Every evening | ORAL | Status: DC | PRN

## 2014-08-20 NOTE — Progress Notes (Signed)
Cardiology Office Note   Date:  08/20/2014   ID:  [name], [name] 13-Jul-1965, MRN [id number]  PCP:  Elizabeth Palau, FNP  Cardiologist:  Willa Rough, MD   Chief Complaint  Patient presents with  . Appointment    Follow-up coronary artery disease      History of Present Illness: [name] is a 49 y.o. female who presents today to follow-up coronary disease. She is post CABG. In 2014 she had symptoms and catheterization showed that her grafts were patent. She has stable exertional chest tightness. This may be angina. As we have increased her Imdur, she has had less. We are not able to use a higher dose of beta blocker. She also has an additional discomfort in her left upper chest which is not ischemic in origin. It sounds like she has some type of muscle spasm that is helped with a muscle relaxer.    Past Medical History  Diagnosis Date  . Anxiety   . Asthma   . Diabetes mellitus     Scheduled to go on insulin pump  . History of heart attack   . GERD (gastroesophageal reflux disease)   . Esophageal stricture   . CAD (coronary artery disease)     4v CABG, 9/10; NL LVF, status post followup Cardiolite November 2012 no ischemia ejection fraction 65%  . Dyslipidemia      LDL 22 mg percent on Crestor  . Hypothyroidism   . Hypertension   . Hx of CABG     September, 2010  . Ejection fraction     Past Surgical History  Procedure Laterality Date  . Angioplasty      with stenting   . Coronary artery bypass graft  01/26/2009  . Open heart surgery   2010  . Cardiac surgery    . Cesarean section    . Eye surgery      Patient Active Problem List   Diagnosis Date Noted  . Obesity, unspecified 08/03/2013  . Hx of CABG   . Ejection fraction   . HTN (hypertension) 08/22/2012  . Hypothyroidism 09/29/2011  . Edema 08/25/2011  . Gastroparesis 09/07/2010  . Hyperlipidemia 02/21/2010  . MUSCULOSKELETAL PAIN 04/29/2009  . PALPITATIONS 04/12/2009  . WHEEZING  04/12/2009  . GERD 01/19/2009  . DIABETES MELLITUS 06/12/2007  . ANXIETY 06/12/2007  . Coronary artery disease involving coronary bypass graft of native heart 06/12/2007  . ASTHMA 06/12/2007      Current Outpatient Prescriptions  Medication Sig Dispense Refill  . albuterol (PROVENTIL) (2.5 MG/3ML) 0.083% nebulizer solution Take 2.5 mg by nebulization every 4 (four) hours as needed. For wheezing.    Marland Kitchen albuterol (VENTOLIN HFA) 108 (90 BASE) MCG/ACT inhaler Inhale 2 puffs into the lungs every 6 (six) hours as needed for wheezing or shortness of breath.     . ALPRAZolam (XANAX) 0.5 MG tablet TAKE 1 TABLET BY MOUTH TWICE A DAY 60 tablet 2  . amLODipine (NORVASC) 5 MG tablet Take 5 mg by mouth daily.     Marland Kitchen aspirin 81 MG chewable tablet Chew 81 mg by mouth daily.    . fish oil-omega-3 fatty acids 1000 MG capsule Take 2 capsules (2 g total) by mouth 2 (two) times daily.    . Fluticasone-Salmeterol (ADVAIR DISKUS) 100-50 MCG/DOSE AEPB Inhale 1 puff into the lungs 2 (two) times daily.     . furosemide (LASIX) 40 MG tablet Take 1 tablet (40 mg total) by mouth daily. 30 tablet 6  .  Insulin Human (INSULIN PUMP) SOLN Inject into the skin continuous. NOVOLOG INSULIN    . isosorbide mononitrate (IMDUR) 60 MG 24 hr tablet Take 1 tablet (60 mg total) by mouth daily. 90 tablet 3  . lansoprazole (PREVACID SOLUTAB) 30 MG disintegrating tablet 30 mg twice a day 30 minutes before breakfast and 30 minutes before supper 60 tablet 11  . levothyroxine (SYNTHROID, LEVOTHROID) 100 MCG tablet Take 100 mcg by mouth daily before breakfast.    . metoprolol tartrate (LOPRESSOR) 25 MG tablet Take 0.5 tablets (12.5 mg total) by mouth 2 (two) times daily. 60 tablet 6  . nitroGLYCERIN (NITROSTAT) 0.4 MG SL tablet Place 0.4 mg under the tongue every 5 (five) minutes as needed for chest pain.    . prednisoLONE acetate (PRED FORTE) 1 % ophthalmic suspension   2  . rosuvastatin (CRESTOR) 20 MG tablet Take 1 tablet (20 mg total) by  mouth daily. 90 tablet 3  . tiZANidine (ZANAFLEX) 4 MG tablet Take 1/2 to 1 tablet at bedtime as needed for spasm    . TRUETEST TEST test strip 1 each 5 (five) times daily as needed.   5   No current facility-administered medications for this visit.    Allergies:   Metformin; Metformin and related; and Penicillins    Social History:  The patient  reports that she quit smoking about 21 years ago. Her smoking use included Cigarettes. She has a 3 pack-year smoking history. She has never used smokeless tobacco. She reports that she does not drink alcohol or use illicit drugs.   Family History:  The patient's family history includes Diabetes in her mother; Heart disease in her father. There is no history of Colon cancer.    ROS:  Please see the history of present illness.    Patient denies fever, chills, headache, sweats, rash, change in vision, change in hearing, chest pain, cough, nausea or vomiting, urinary symptoms. All other systems are reviewed and are negative.    PHYSICAL EXAM: VS:  BP 110/67 mmHg  Pulse 62  Ht  (1.626 m)  Wt 217 lb 12.8 oz (98.793 kg)  BMI 37.37 kg/m2  SpO2 98%  LMP 07/06/2014 , Patient is overweight. She is stable. She is oriented to person time and place. Affect is normal. Head is atraumatic. Sclera and conjunctiva are normal. There is no jugular venous distention. Lungs are clear. Respiratory effort is nonlabored. Cardiac exam reveals S1 and S2. The abdomen is soft. There is no peripheral edema. There are no musculoskeletal deformities. There are no skin rashes. Her left upper anterior chest is tender to palpation. There is no redness or heat. There are no bruises.  EKG:   EKG is done today and reviewed by me. There are nonspecific ST-T wave changes. There is no change from the past.   Recent Labs: No results found for requested labs within last 365 days.    Lipid Panel    Component Value Date/Time   CHOL  01/24/2009 0400    185        ATP III  CLASSIFICATION:  <200     mg/dL   Desirable  161-096  mg/dL   Borderline High  >=045    mg/dL   High          TRIG 409* 01/24/2009 0400   HDL 34* 01/24/2009 0400   CHOLHDL 5.4 01/24/2009 0400   VLDL 36 01/24/2009 0400   LDLCALC * 01/24/2009 0400    115  Total Cholesterol/HDL:CHD Risk Coronary Heart Disease Risk Table                     Men   Women  1/2 Average Risk   3.4   3.3  Average Risk       5.0   4.4  2 X Average Risk   9.6   7.1  3 X Average Risk  23.4   11.0        Use the calculated Patient Ratio above and the CHD Risk Table to determine the patient's CHD Risk.        ATP III CLASSIFICATION (LDL):  <100     mg/dL   Optimal  [id number]  mg/dL   Near or Above                    Optimal  130-159  mg/dL   Borderline  [id number]  mg/dL   High  >811>190     mg/dL   Very High      Wt Readings from Last 3 Encounters:  08/20/14 217 lb 12.8 oz (98.793 kg)  08/07/14 219 lb 2 oz (99.394 kg)  06/17/14 218 lb 6.4 oz (99.066 kg)      Current medicines are reviewed  The patient understands her medications.     ASSESSMENT AND PLAN:

## 2014-08-20 NOTE — Patient Instructions (Signed)
Your physician recommends that you schedule a follow-up appointment in: 6 weeks. Your physician has recommended you make the following change in your medication:  Increase isosorbide mononitrate to 90 mg daily. Please take 1&1/2 tablets of your 60 mg tablets daily. Continue all other medications the same. You have been given a one time refill on your zanaflex.

## 2014-08-20 NOTE — Assessment & Plan Note (Signed)
At this point we will see if she can tolerate 90 mg Imdur to see if it further helps her exercise related chest burning. I am still not inclined to proceed with exercise testing or repeat catheterization at this time. I will considered at the time of her next visit.

## 2014-08-20 NOTE — Assessment & Plan Note (Signed)
She appears to be having some type of muscle spasm in the left anterior upper chest. This is helped with a muscle relaxant. I have encouraged her to continue the muscle relaxant. No other workup at this time for this problem.

## 2014-09-09 ENCOUNTER — Other Ambulatory Visit: Payer: Self-pay | Admitting: Cardiology

## 2014-09-16 ENCOUNTER — Other Ambulatory Visit: Payer: Self-pay | Admitting: Cardiology

## 2014-09-18 ENCOUNTER — Other Ambulatory Visit: Payer: Self-pay | Admitting: Gastroenterology

## 2014-09-18 ENCOUNTER — Telehealth: Payer: Self-pay

## 2014-09-18 MED ORDER — ALPRAZOLAM 0.5 MG PO TABS: 0.5000 mg | ORAL_TABLET | Freq: Two times a day (BID) | ORAL | Status: DC

## 2014-09-18 NOTE — Telephone Encounter (Signed)
Refilled Xanax per Zella Ballobin.  Called patient to let her know.  Patient acknowledged and understood

## 2014-09-28 ENCOUNTER — Ambulatory Visit (INDEPENDENT_AMBULATORY_CARE_PROVIDER_SITE_OTHER): Payer: Medicare Other | Admitting: Ophthalmology

## 2014-10-02 ENCOUNTER — Encounter: Payer: Self-pay | Admitting: Cardiology

## 2014-10-02 ENCOUNTER — Ambulatory Visit (INDEPENDENT_AMBULATORY_CARE_PROVIDER_SITE_OTHER): Payer: Medicare Other | Admitting: Cardiology

## 2014-10-02 VITALS — BP 111/69 | HR 80 | Ht 64.0 in | Wt 205.0 lb

## 2014-10-02 DIAGNOSIS — I25709 Atherosclerosis of coronary artery bypass graft(s), unspecified, with unspecified angina pectoris: Secondary | ICD-10-CM | POA: Diagnosis not present

## 2014-10-02 DIAGNOSIS — I25810 Atherosclerosis of coronary artery bypass graft(s) without angina pectoris: Secondary | ICD-10-CM

## 2014-10-02 DIAGNOSIS — M62838 Other muscle spasm: Secondary | ICD-10-CM

## 2014-10-02 MED ORDER — ISOSORBIDE MONONITRATE ER 60 MG PO TB24: 60.0000 mg | ORAL_TABLET | Freq: Every day | ORAL | Status: DC

## 2014-10-02 MED ORDER — TIZANIDINE HCL 4 MG PO TABS: 2.0000 mg | ORAL_TABLET | Freq: Every evening | ORAL | Status: DC | PRN

## 2014-10-02 NOTE — Patient Instructions (Signed)
Your physician has recommended you make the following change in your medication:  Decrease your isosorbide mononitrate to 60 mg daily. Continue all other medications the same. Refill send to for zanaflex lasting till August 2016 appointment per Myrtis SerKatz. Your physician recommends that you schedule a follow-up appointment in: August 2016 with Dr. Myrtis SerKatz.

## 2014-10-02 NOTE — Progress Notes (Signed)
Cardiology Office Note   Date:  10/02/2014   ID:  [name], DOB 29-Dec-1965, MRN [id number]  PCP:  Elizabeth PalauANDERSON,TERESA, FNP  Cardiologist:  Willa RoughJeffrey Levita Monical, MD   Chief Complaint  Patient presents with  . Appointment    Follow-up coronary artery disease      History of Present Illness: [name] is a 49 y.o. female who presents today to follow up coronary disease. I saw her last March, 2016. At that time I increased her Imdur or dose. She has some mild headaches at this dose. It does not necessarily improve her symptoms. We will cut the dose back to 60. She has discomfort in an area above her left breast toward her left axilla. There is discomfort to palpation. There is no radiation.    Past Medical History  Diagnosis Date  . Anxiety   . Asthma   . Diabetes mellitus     Scheduled to go on insulin pump  . History of heart attack   . GERD (gastroesophageal reflux disease)   . Esophageal stricture   . CAD (coronary artery disease)     4v CABG, 9/10; NL LVF, status post followup Cardiolite November 2012 no ischemia ejection fraction 65%  . Dyslipidemia      LDL 22 mg percent on Crestor  . Hypothyroidism   . Hypertension   . Hx of CABG     September, 2010  . Ejection fraction     Past Surgical History  Procedure Laterality Date  . Angioplasty      with stenting   . Coronary artery bypass graft  01/26/2009  . Open heart surgery   2010  . Cardiac surgery    . Cesarean section    . Eye surgery      Patient Active Problem List   Diagnosis Date Noted  . Muscle spasm 08/20/2014  . Obesity, unspecified 08/03/2013  . Hx of CABG   . Ejection fraction   . HTN (hypertension) 08/22/2012  . Hypothyroidism 09/29/2011  . Edema 08/25/2011  . Gastroparesis 09/07/2010  . Hyperlipidemia 02/21/2010  . MUSCULOSKELETAL PAIN 04/29/2009  . PALPITATIONS 04/12/2009  . WHEEZING 04/12/2009  . GERD 01/19/2009  . DIABETES MELLITUS 06/12/2007  . ANXIETY 06/12/2007  . Coronary  artery disease involving coronary bypass graft of native heart 06/12/2007  . ASTHMA 06/12/2007      Current Outpatient Prescriptions  Medication Sig Dispense Refill  . albuterol (PROVENTIL) (2.5 MG/3ML) 0.083% nebulizer solution Take 2.5 mg by nebulization every 4 (four) hours as needed. For wheezing.    Marland Kitchen. albuterol (VENTOLIN HFA) 108 (90 BASE) MCG/ACT inhaler Inhale 2 puffs into the lungs every 6 (six) hours as needed for wheezing or shortness of breath.     . ALPRAZolam (XANAX) 0.5 MG tablet Take 1 tablet (0.5 mg total) by mouth 2 (two) times daily. 60 tablet 2  . amLODipine (NORVASC) 5 MG tablet Take 5 mg by mouth daily.     Marland Kitchen. aspirin 81 MG chewable tablet Chew 81 mg by mouth daily.    . fish oil-omega-3 fatty acids 1000 MG capsule Take 2 capsules (2 g total) by mouth 2 (two) times daily.    . Fluticasone-Salmeterol (ADVAIR DISKUS) 100-50 MCG/DOSE AEPB Inhale 1 puff into the lungs 2 (two) times daily.     . furosemide (LASIX) 40 MG tablet Take 1 tablet (40 mg total) by mouth daily. 30 tablet 6  . Insulin Human (INSULIN PUMP) SOLN Inject into the skin continuous. NOVOLOG INSULIN    .  isosorbide mononitrate (IMDUR) 60 MG 24 hr tablet Take 1.5 tablets (90 mg total) by mouth daily. 135 tablet 1  . lansoprazole (PREVACID SOLUTAB) 30 MG disintegrating tablet 30 mg twice a day 30 minutes before breakfast and 30 minutes before supper 60 tablet 11  . levothyroxine (SYNTHROID, LEVOTHROID) 100 MCG tablet Take 100 mcg by mouth daily before breakfast.    . metoprolol tartrate (LOPRESSOR) 25 MG tablet Take 0.5 tablets (12.5 mg total) by mouth 2 (two) times daily. 60 tablet 6  . nitroGLYCERIN (NITROSTAT) 0.4 MG SL tablet Place 0.4 mg under the tongue every 5 (five) minutes as needed for chest pain.    . prednisoLONE acetate (PRED FORTE) 1 % ophthalmic suspension   2  . rosuvastatin (CRESTOR) 20 MG tablet Take 1 tablet (20 mg total) by mouth daily. 90 tablet 3  . tiZANidine (ZANAFLEX) 4 MG tablet Take 0.5  tablets (2 mg total) by mouth at bedtime as needed for muscle spasms. 15 tablet 0  . TRUETEST TEST test strip 1 each 5 (five) times daily as needed.   5   No current facility-administered medications for this visit.    Allergies:   Metformin; Metformin and related; and Penicillins    Social History:  The patient  reports that she quit smoking about 21 years ago. Her smoking use included Cigarettes. She has a 3 pack-year smoking history. She has never used smokeless tobacco. She reports that she does not drink alcohol or use illicit drugs.   Family History:  The patient's family history includes Diabetes in her mother; Heart disease in her father. There is no history of Colon cancer.    ROS:  Please see the history of present illness.   Patient denies fever, chills, headache, sweats, rash, change in vision, change in hearing, chest pain, cough, nausea or vomiting, urinary symptoms. All other systems are reviewed and are negative.     PHYSICAL EXAM: VS:  BP 111/69 mmHg  Pulse 80  Ht  (1.626 m)  Wt 205 lb (92.987 kg)  BMI 35.17 kg/m2  SpO2 98% , Patient is overweight. She stable. She is oriented to person time and place. Affect is normal. Head is atraumatic. Sclera and conjunctiva are normal. She has poor dentition. There is no jugulovenous distention. Lungs are clear. Respiratory effort is nonlabored. Cardiac exam reveals an S1 and S2. Abdomen is soft. There is no peripheral edema. The patient has pain to palpation and a localized area between her left breast and axilla. There is no mass. There is pain to palpation over an aspect of one of the muscles. There are no skin rashes.  EKG:   EKG is not done today.   Recent Labs: No results found for requested labs within last 365 days.    Lipid Panel    Component Value Date/Time   CHOL  01/24/2009 0400    185        ATP III CLASSIFICATION:  <200     mg/dL   Desirable  161-096  mg/dL   Borderline High  >=045    mg/dL   High           TRIG 409* 01/24/2009 0400   HDL 34* 01/24/2009 0400   CHOLHDL 5.4 01/24/2009 0400   VLDL 36 01/24/2009 0400   LDLCALC * 01/24/2009 0400    115        Total Cholesterol/HDL:CHD Risk Coronary Heart Disease Risk Table  Men   Women  1/2 Average Risk   3.4   3.3  Average Risk       5.0   4.4  2 X Average Risk   9.6   7.1  3 X Average Risk  23.4   11.0        Use the calculated Patient Ratio above and the CHD Risk Table to determine the patient's CHD Risk.        ATP III CLASSIFICATION (LDL):  <100     mg/dL   Optimal  [id number]  mg/dL   Near or Above                    Optimal  130-159  mg/dL   Borderline  [id number]  mg/dL   High  >811>190     mg/dL   Very High      Wt Readings from Last 3 Encounters:  10/02/14 205 lb (92.987 kg)  08/20/14 217 lb 12.8 oz (98.793 kg)  08/07/14 219 lb 2 oz (99.394 kg)      Current medicines are reviewed  The patient understands her medications.     ASSESSMENT AND PLAN:

## 2014-10-02 NOTE — Assessment & Plan Note (Signed)
The patient appears to have some muscle spasm and some resting discomfort of the muscle group in the left anterior upper chest. I feel it is appropriate for her to use a muscle relaxant. No further workup.

## 2014-10-02 NOTE — Assessment & Plan Note (Signed)
We know from her catheterization in April, 2014, that all of her grafts widely patent and there is good LV function. I continue to try to adjust her medications for the possibility that some of her symptoms are angina. However she does not need other testing. Imdur will be decreased back to 60 mg daily.

## 2014-10-09 ENCOUNTER — Ambulatory Visit (INDEPENDENT_AMBULATORY_CARE_PROVIDER_SITE_OTHER): Payer: Medicare Other | Admitting: Ophthalmology

## 2014-10-09 DIAGNOSIS — I10 Essential (primary) hypertension: Secondary | ICD-10-CM | POA: Diagnosis not present

## 2014-10-09 DIAGNOSIS — H35033 Hypertensive retinopathy, bilateral: Secondary | ICD-10-CM | POA: Diagnosis not present

## 2014-10-09 DIAGNOSIS — E11339 Type 2 diabetes mellitus with moderate nonproliferative diabetic retinopathy without macular edema: Secondary | ICD-10-CM

## 2014-10-09 DIAGNOSIS — H43813 Vitreous degeneration, bilateral: Secondary | ICD-10-CM

## 2014-10-09 DIAGNOSIS — E11359 Type 2 diabetes mellitus with proliferative diabetic retinopathy without macular edema: Secondary | ICD-10-CM | POA: Diagnosis not present

## 2014-10-09 DIAGNOSIS — H3531 Nonexudative age-related macular degeneration: Secondary | ICD-10-CM

## 2014-10-09 DIAGNOSIS — E11319 Type 2 diabetes mellitus with unspecified diabetic retinopathy without macular edema: Secondary | ICD-10-CM

## 2014-10-20 ENCOUNTER — Other Ambulatory Visit: Payer: Self-pay | Admitting: *Deleted

## 2014-10-20 MED ORDER — METOPROLOL TARTRATE 25 MG PO TABS: 12.5000 mg | ORAL_TABLET | Freq: Two times a day (BID) | ORAL | Status: DC

## 2014-10-20 NOTE — Telephone Encounter (Signed)
Metoprolol refilled today.  

## 2015-01-06 ENCOUNTER — Ambulatory Visit: Payer: Medicare Other | Admitting: Cardiology

## 2015-01-18 ENCOUNTER — Other Ambulatory Visit: Payer: Self-pay | Admitting: Gastroenterology

## 2015-01-18 ENCOUNTER — Telehealth: Payer: Self-pay | Admitting: Gastroenterology

## 2015-01-19 NOTE — Telephone Encounter (Signed)
Ok to refill until followup with her primary GI MD, Dr. Arlyce Dice

## 2015-01-19 NOTE — Telephone Encounter (Signed)
Called in prescription for patient with 0 refills under Dr Rhea Belton   Called patient to inform

## 2015-01-19 NOTE — Telephone Encounter (Signed)
Dr Rhea Belton, You are doc of the day. Can she have her refill?

## 2015-01-20 ENCOUNTER — Ambulatory Visit (INDEPENDENT_AMBULATORY_CARE_PROVIDER_SITE_OTHER): Payer: Medicare Other | Admitting: Cardiology

## 2015-01-20 ENCOUNTER — Encounter: Payer: Self-pay | Admitting: Cardiology

## 2015-01-20 VITALS — BP 117/70 | HR 57 | Ht 64.0 in | Wt 214.0 lb

## 2015-01-20 DIAGNOSIS — M62838 Other muscle spasm: Secondary | ICD-10-CM | POA: Diagnosis not present

## 2015-01-20 DIAGNOSIS — I25810 Atherosclerosis of coronary artery bypass graft(s) without angina pectoris: Secondary | ICD-10-CM

## 2015-01-20 MED ORDER — TIZANIDINE HCL 4 MG PO TABS: 2.0000 mg | ORAL_TABLET | Freq: Every evening | ORAL | Status: DC | PRN

## 2015-01-20 NOTE — Progress Notes (Signed)
Cardiology Office Note   Date:  01/20/2015   ID:  Kim, [name] [birth year], MRN [id number]  PCP:  Elizabeth Palau, FNP  Cardiologist:  Willa Rough, MD   Chief Complaint  Patient presents with  . Appointment    Follow-up coronary disease      History of Present Illness: [name] is a 49 y.o. female who presents today to follow-up coronary disease. She is post CABG. She has aggressive disease. She did undergo follow catheterization in 2014. All of her grafts were patent at that time. She has good LV function. I had seen her more frequently recently because of left upper chest discomfort. It is now clear that this is some type of muscle spasm that she has in her left upper chest. It responds to muscle relaxants. She is stable since her last visit.    Past Medical History  Diagnosis Date  . Anxiety   . Asthma   . Diabetes mellitus     Scheduled to go on insulin pump  . History of heart attack   . GERD (gastroesophageal reflux disease)   . Esophageal stricture   . CAD (coronary artery disease)     4v CABG, 9/10; NL LVF, status post followup Cardiolite November 2012 no ischemia ejection fraction 65%  . Dyslipidemia      LDL 22 mg percent on Crestor  . Hypothyroidism   . Hypertension   . Hx of CABG     September, 2010  . Ejection fraction     Past Surgical History  Procedure Laterality Date  . Angioplasty      with stenting   . Coronary artery bypass graft  01/26/2009  . Open heart surgery   2010  . Cardiac surgery    . Cesarean section    . Eye surgery      Patient Active Problem List   Diagnosis Date Noted  . Muscle spasm 08/20/2014  . Obesity, unspecified 08/03/2013  . Hx of CABG   . Ejection fraction   . HTN (hypertension) 08/22/2012  . Hypothyroidism 09/29/2011  . Edema 08/25/2011  . Gastroparesis 09/07/2010  . Hyperlipidemia 02/21/2010  . MUSCULOSKELETAL PAIN 04/29/2009  . PALPITATIONS 04/12/2009  . WHEEZING 04/12/2009  . GERD  01/19/2009  . DIABETES MELLITUS 06/12/2007  . ANXIETY 06/12/2007  . Coronary artery disease involving coronary bypass graft of native heart 06/12/2007  . ASTHMA 06/12/2007      Current Outpatient Prescriptions  Medication Sig Dispense Refill  . albuterol (PROVENTIL) (2.5 MG/3ML) 0.083% nebulizer solution Take 2.5 mg by nebulization every 4 (four) hours as needed. For wheezing.    Marland Kitchen albuterol (VENTOLIN HFA) 108 (90 BASE) MCG/ACT inhaler Inhale 2 puffs into the lungs every 6 (six) hours as needed for wheezing or shortness of breath.     . ALPRAZolam (XANAX) 0.5 MG tablet Take 1 tablet (0.5 mg total) by mouth 2 (two) times daily. 60 tablet 2  . amLODipine (NORVASC) 5 MG tablet Take 5 mg by mouth daily.     Marland Kitchen aspirin 81 MG chewable tablet Chew 81 mg by mouth daily.    . fish oil-omega-3 fatty acids 1000 MG capsule Take 2 capsules (2 g total) by mouth 2 (two) times daily.    . Fluticasone-Salmeterol (ADVAIR DISKUS) 100-50 MCG/DOSE AEPB Inhale 1 puff into the lungs 2 (two) times daily.     . furosemide (LASIX) 40 MG tablet Take 1 tablet (40 mg total) by mouth daily. 30 tablet 6  . Insulin  Human (INSULIN PUMP) SOLN Inject into the skin continuous. NOVOLOG INSULIN    . isosorbide mononitrate (IMDUR) 60 MG 24 hr tablet Take 1 tablet (60 mg total) by mouth daily. 90 tablet 1  . lansoprazole (PREVACID SOLUTAB) 30 MG disintegrating tablet 30 mg twice a day 30 minutes before breakfast and 30 minutes before supper 60 tablet 11  . levothyroxine (SYNTHROID, LEVOTHROID) 100 MCG tablet Take 100 mcg by mouth daily before breakfast.    . metoprolol tartrate (LOPRESSOR) 25 MG tablet Take 0.5 tablets (12.5 mg total) by mouth 2 (two) times daily. 30 tablet 6  . nitroGLYCERIN (NITROSTAT) 0.4 MG SL tablet Place 0.4 mg under the tongue every 5 (five) minutes as needed for chest pain.    . prednisoLONE acetate (PRED FORTE) 1 % ophthalmic suspension   2  . rosuvastatin (CRESTOR) 20 MG tablet Take 1 tablet (20 mg total)  by mouth daily. 90 tablet 3  . tiZANidine (ZANAFLEX) 4 MG tablet Take 0.5 tablets (2 mg total) by mouth at bedtime as needed for muscle spasms. 15 tablet 3  . TRUETEST TEST test strip 1 each 5 (five) times daily as needed.   5   No current facility-administered medications for this visit.    Allergies:   Metformin; Metformin and related; and Penicillins    Social History:  The patient  reports that she quit smoking about 21 years ago. Her smoking use included Cigarettes. She has a 3 pack-year smoking history. She has never used smokeless tobacco. She reports that she does not drink alcohol or use illicit drugs.   Family History:  The patient's family history includes Diabetes in her mother; Heart disease in her father. There is no history of Colon cancer.    ROS:  Please see the history of present illness.    Patient denies fever, chills, headache, sweats, rash, change in vision, change in hearing, chest pain, cough, nausea or vomiting, urinary symptoms. All other systems are reviewed and are negative.   PHYSICAL EXAM: VS:  BP 117/70 mmHg  Pulse 57  Ht 5\' 4"  (1.626 m)  Wt 214 lb (97.07 kg)  BMI 36.72 kg/m2  SpO2 98% , The patient is oriented to person time and place. Affect is normal. Head is atraumatic. Sclera and conjunctiva are normal. She is here with her husband today. Lungs are clear. Respiratory effort is not labored. Cardiac exam reveals S1 and S2. She has her old sternotomy scar. Abdomen is soft. There is no peripheral edema. There are no skin rashes. She has poor dentition.  EKG:   EKG is not done today. Recent Labs: No results found for requested labs within last 365 days.    Lipid Panel    Component Value Date/Time   CHOL  01/24/2009 0400    185        ATP III CLASSIFICATION:  <200     mg/dL   Desirable  454-098  mg/dL   Borderline High  >=119    mg/dL   High          TRIG 147* 01/24/2009 0400   HDL 34* 01/24/2009 0400   CHOLHDL 5.4 01/24/2009 0400   VLDL 36  01/24/2009 0400   LDLCALC * 01/24/2009 0400    115        Total Cholesterol/HDL:CHD Risk Coronary Heart Disease Risk Table                     Men   Women  1/2 Average  Risk   3.4   3.3  Average Risk       5.0   4.4  2 X Average Risk   9.6   7.1  3 X Average Risk  23.4   11.0        Use the calculated Patient Ratio above and the CHD Risk Table to determine the patient's CHD Risk.        ATP III CLASSIFICATION (LDL):  <100     mg/dL   Optimal  782-956  mg/dL   Near or Above                    Optimal  130-159  mg/dL   Borderline  213-086  mg/dL   High  >578     mg/dL   Very High      Wt Readings from Last 3 Encounters:  01/20/15 214 lb (97.07 kg)  10/02/14 205 lb (92.987 kg)  08/20/14 217 lb 12.8 oz (98.793 kg)      Current medicines are reviewed  The patient understands her medications.     ASSESSMENT AND PLAN:

## 2015-01-20 NOTE — Assessment & Plan Note (Signed)
The patient has muscle spasm in the area of her left shoulder and left upper anterior chest. It is helped with muscle relaxants. No further workup.

## 2015-01-20 NOTE — Patient Instructions (Signed)
Your physician recommends that you continue on your current medications as directed. Please refer to the Current Medication list given to you today. Your physician recommends that you schedule a follow-up appointment in: 6 months. You will receive a reminder letter in the mail in about 4 months reminding you to call and schedule your appointment. If you don't receive this letter, please contact our office. 

## 2015-01-20 NOTE — Assessment & Plan Note (Signed)
She has normal LV function. Grafts were patent in April, 2014. She is on appropriate medications. No change in therapy.

## 2015-02-04 ENCOUNTER — Other Ambulatory Visit: Payer: Self-pay | Admitting: Cardiology

## 2015-02-10 ENCOUNTER — Other Ambulatory Visit: Payer: Self-pay | Admitting: Cardiology

## 2015-02-17 ENCOUNTER — Other Ambulatory Visit: Payer: Self-pay | Admitting: Gastroenterology

## 2015-02-17 ENCOUNTER — Ambulatory Visit (INDEPENDENT_AMBULATORY_CARE_PROVIDER_SITE_OTHER): Payer: Medicare Other | Admitting: Ophthalmology

## 2015-02-17 MED ORDER — ALPRAZOLAM 0.5 MG PO TABS: 0.5000 mg | ORAL_TABLET | Freq: Two times a day (BID) | ORAL | Status: DC

## 2015-02-17 NOTE — Telephone Encounter (Signed)
Dr Arlyce Dice, Is this ok to refill?

## 2015-02-17 NOTE — Addendum Note (Signed)
Addended by: Marlowe Kays on: 02/17/2015 03:00 PM   Modules accepted: Orders

## 2015-02-17 NOTE — Telephone Encounter (Addendum)
Dr Arlyce Dice, I sent this in, You closed the note

## 2015-02-22 ENCOUNTER — Ambulatory Visit (INDEPENDENT_AMBULATORY_CARE_PROVIDER_SITE_OTHER): Payer: Medicare Other | Admitting: Ophthalmology

## 2015-02-22 DIAGNOSIS — E113592 Type 2 diabetes mellitus with proliferative diabetic retinopathy without macular edema, left eye: Secondary | ICD-10-CM | POA: Diagnosis not present

## 2015-02-22 DIAGNOSIS — I10 Essential (primary) hypertension: Secondary | ICD-10-CM | POA: Diagnosis not present

## 2015-02-22 DIAGNOSIS — H35033 Hypertensive retinopathy, bilateral: Secondary | ICD-10-CM

## 2015-02-22 DIAGNOSIS — E113552 Type 2 diabetes mellitus with stable proliferative diabetic retinopathy, left eye: Secondary | ICD-10-CM | POA: Diagnosis not present

## 2015-02-22 DIAGNOSIS — E11319 Type 2 diabetes mellitus with unspecified diabetic retinopathy without macular edema: Secondary | ICD-10-CM

## 2015-02-22 DIAGNOSIS — H43813 Vitreous degeneration, bilateral: Secondary | ICD-10-CM | POA: Diagnosis not present

## 2015-02-22 DIAGNOSIS — H353132 Nonexudative age-related macular degeneration, bilateral, intermediate dry stage: Secondary | ICD-10-CM | POA: Diagnosis not present

## 2015-02-22 DIAGNOSIS — E113391 Type 2 diabetes mellitus with moderate nonproliferative diabetic retinopathy without macular edema, right eye: Secondary | ICD-10-CM | POA: Diagnosis not present

## 2015-03-24 ENCOUNTER — Other Ambulatory Visit: Payer: Self-pay | Admitting: Cardiology

## 2015-04-12 ENCOUNTER — Ambulatory Visit (INDEPENDENT_AMBULATORY_CARE_PROVIDER_SITE_OTHER): Payer: Medicare Other | Admitting: Ophthalmology

## 2015-05-15 ENCOUNTER — Other Ambulatory Visit: Payer: Self-pay | Admitting: Cardiology

## 2015-05-18 NOTE — Telephone Encounter (Signed)
Please advise 

## 2015-05-19 MED ORDER — ROSUVASTATIN CALCIUM 20 MG PO TABS: 20.0000 mg | ORAL_TABLET | Freq: Every day | ORAL | Status: DC

## 2015-07-02 ENCOUNTER — Encounter: Payer: Self-pay | Admitting: Gastroenterology

## 2015-07-02 ENCOUNTER — Ambulatory Visit (INDEPENDENT_AMBULATORY_CARE_PROVIDER_SITE_OTHER): Payer: Self-pay | Admitting: Gastroenterology

## 2015-07-02 ENCOUNTER — Other Ambulatory Visit (INDEPENDENT_AMBULATORY_CARE_PROVIDER_SITE_OTHER): Payer: Self-pay

## 2015-07-02 VITALS — BP 144/66 | HR 78 | Ht 64.0 in | Wt 229.0 lb

## 2015-07-02 DIAGNOSIS — R140 Abdominal distension (gaseous): Secondary | ICD-10-CM

## 2015-07-02 DIAGNOSIS — E1065 Type 1 diabetes mellitus with hyperglycemia: Secondary | ICD-10-CM

## 2015-07-02 DIAGNOSIS — E108 Type 1 diabetes mellitus with unspecified complications: Secondary | ICD-10-CM

## 2015-07-02 DIAGNOSIS — IMO0002 Reserved for concepts with insufficient information to code with codable children: Secondary | ICD-10-CM

## 2015-07-02 LAB — IGA: IgA: 211 mg/dL (ref 68–378)

## 2015-07-02 MED ORDER — HYOSCYAMINE SULFATE 0.125 MG SL SUBL: 0.1250 mg | SUBLINGUAL_TABLET | Freq: Three times a day (TID) | SUBLINGUAL | Status: DC | PRN

## 2015-07-02 MED ORDER — RIFAXIMIN 550 MG PO TABS: 550.0000 mg | ORAL_TABLET | Freq: Three times a day (TID) | ORAL | Status: DC

## 2015-07-02 NOTE — Patient Instructions (Signed)
We have sent medications to your pharmacy for you to pick up at your convenience  We have sent medications to your pharmacy for you to pick up at your convenience

## 2015-07-02 NOTE — Progress Notes (Signed)
HPI :  50 y/o female with history of type I DM on insulin pump and CAD s/p stenting and CABG, here to follow up for bloating. Former patient of Dr. Arlyce Dice. Patient reports she has a history of "gastroparesis". She has been having elevated glucose levels recently and wonders if it is related. Her main symptoms is significant postprandial bloating / fullness of her entire abdomen. She has some early satiety as well. She denies any dysphagia. No odynophagia. She has significant heartburn for which she takes prevacid soluble tablet . The prevacid works fairly well to control her heartburn and does not have breakthrough on this dosing. She thinks she has been gaining some weight. She has had benefit with empiric trials of antibiotics in the past for her bloating. She reports she has been on Domperidone for about a year for suspected gastroparesis previously and she thinks she did really well on it. She came off it and thinks for the most part she has been doing okay. She has been on Reglan in the past, she had side effects from it such as nightmares and it did not work well for her. She reports she thought she had a prior gastric emptying study which showed gastroparesis however the two gastric emptying studies listed in 2011 and 2013 were normal without gastroparesis. She does not follow a gastroparesis diet. Bloating is the main complaint which bothers her at present time.   EGD 03/2009 - mild esophagitis and GEJ stricture, dilated to 18mm GES 07/2011 - normal GES 2011 - normal  Past Medical History  Diagnosis Date  . Anxiety   . Asthma   . Diabetes mellitus     Scheduled to go on insulin pump  . History of heart attack   . GERD (gastroesophageal reflux disease)   . Esophageal stricture   . CAD (coronary artery disease)     4v CABG, 9/10; NL LVF, status post followup Cardiolite November 2012 no ischemia ejection fraction 65%  . Dyslipidemia      LDL 22 mg percent on Crestor  . Hypothyroidism    . Hypertension   . Hx of CABG     September, 2010  . Ejection fraction      Past Surgical History  Procedure Laterality Date  . Angioplasty      with stenting   . Coronary artery bypass graft  01/26/2009  . Open heart surgery   2010  . Cardiac surgery    . Cesarean section    . Eye surgery     Family History  Problem Relation Age of Onset  . Colon cancer Neg Hx   . Heart disease Father   . Diabetes Mother    Social History  Substance Use Topics  . Smoking status: Former Smoker -- 1.00 packs/day for 3 years    Types: Cigarettes    Quit date: 05/22/1993  . Smokeless tobacco: Never Used     Comment:  Year Quit: 1995  . Alcohol Use: No   Current Outpatient Prescriptions  Medication Sig Dispense Refill  . albuterol (PROVENTIL) (2.5 MG/3ML) 0.083% nebulizer solution Take 2.5 mg by nebulization every 4 (four) hours as needed. For wheezing.    Marland Kitchen albuterol (VENTOLIN HFA) 108 (90 BASE) MCG/ACT inhaler Inhale 2 puffs into the lungs every 6 (six) hours as needed for wheezing or shortness of breath.     . ALPRAZolam (XANAX) 0.5 MG tablet Take 1 ta13blet (0.5 mg total) by mouth 2 (two) times daily. 60 tablet 0  .  amLODipine (NORVASC) 5 MG tablet Take 5 mg by mouth daily.     Marland Kitchen aspirin 81 MG chewable tablet Chew 81 mg by mouth daily.    . fish oil-omega-3 fatty acids 1000 MG capsule Take 2 capsules (2 g total) by mouth 2 (two) times daily.    . Fluticasone-Salmeterol (ADVAIR DISKUS) 100-50 MCG/DOSE AEPB Inhale 1 puff into the lungs 2 (two) times daily.     . furosemide (LASIX) 40 MG tablet TAKE 1 TABLET (40 MG TOTAL) BY MOUTH DAILY. 30 tablet 6  . Insulin Human (INSULIN PUMP) SOLN Inject into the skin continuous. NOVOLOG INSULIN    . lansoprazole (PREVACID SOLUTAB) 30 MG disintegrating tablet 30 mg twice a day 30 minutes before breakfast and 30 minutes before supper 60 tablet 11  . levothyroxine (SYNTHROID, LEVOTHROID) 100 MCG tablet Take 100 mcg by mouth daily before breakfast.    .  metoprolol tartrate (LOPRESSOR) 25 MG tablet Take 0.5 tablets (12.5 mg total) by mouth 2 (two) times daily. 30 tablet 6  . nitroGLYCERIN (NITROSTAT) 0.4 MG SL tablet Place 0.4 mg under the tongue every 5 (five) minutes as needed for chest pain.    . prednisoLONE acetate (PRED FORTE) 1 % ophthalmic suspension   2  . rosuvastatin (CRESTOR) 20 MG tablet Take 1 tablet (20 mg total) by mouth daily. 90 tablet 3  . tiZANidine (ZANAFLEX) 4 MG tablet Take 0.5 tablets (2 mg total) by mouth at bedtime as needed for muscle spasms. 15 tablet 1  . TRUETEST TEST test strip 1 each 5 (five) times daily as needed.   5  . hyoscyamine (LEVSIN SL) 0.125 MG SL tablet Place 1 tablet (0.125 mg total) under the tongue every 8 (eight) hours as needed. 60 tablet 0  . rifaximin (XIFAXAN) 550 MG TABS tablet Take 1 tablet (550 mg total) by mouth 3 (three) times daily. 42 tablet 0   No current facility-administered medications for this visit.   Allergies  Allergen Reactions  . Metformin Anaphylaxis  . Metformin And Related Shortness Of Breath  . Penicillins Anaphylaxis    Throat swells difficulty breathing     Review of Systems: All systems reviewed and negative except where noted in HPI.   Lab Results  Component Value Date   HGBA1C 9.3* 06/17/2014    Lab Results  Component Value Date   CREATININE 0.56 06/16/2013   BUN 6 06/16/2013   NA 140 06/16/2013   K 4.3 06/16/2013   CL 104 06/16/2013   CO2 23 06/16/2013    Lab Results  Component Value Date   WBC 10.1 06/16/2013   HGB 12.2 06/16/2013   HCT 36.7 06/16/2013   MCV 84.0 06/16/2013   PLT 314 06/16/2013      Physical Exam: BP 144/66 mmHg  Pulse 78  Ht 5\' 4"  (1.626 m)  Wt 229 lb (103.874 kg)  BMI 39.29 kg/m2 Constitutional: Pleasant,well-developed, female in no acute distress. HEENT: Normocephalic and atraumatic. Conjunctivae are normal. No scleral icterus. Neck supple.  Cardiovascular: Normal rate, regular rhythm.  Pulmonary/chest: Effort  normal and breath sounds normal. No wheezing, rales or rhonchi. Abdominal: Soft, protuberant, nontender. Bowel sounds active throughout. There are no masses palpable. No hepatomegaly. Extremities: no edema Lymphadenopathy: No cervical adenopathy noted. Neurological: Alert and oriented to person place and time. Skin: Skin is warm and dry. No rashes noted. Psychiatric: Normal mood and affect. Behavior is normal.   ASSESSMENT AND PLAN: 50 y/o female with h/o type I DM and CAD s/p CABG,  here for follow up for symptoms of postprandial bloating / abdominal distension. She has previously attributed her symptoms to gastroparesis, and based on her symptoms and history she is at risk for this. However, on reviewing her prior gastric emptying studies, I don't see evidence of gastroparesis on these results unless she has had a remote exam which is not in Epic. I discussed other possibilities with her that could cause her symptoms. She could have SIBO as she has had significant benefit from empiric antibiotics in the past.   At this time, given her DM and symptoms, will screen for celiac as she has not had this done previously. I otherwise offered her an empiric trial of Rifaximin for her bloating to see if this helps. I also counseled her on a gastroparesis diet to see if this helps. We discussed her prior gastric emptying studies, and while she has not had evidence of gastroparesis her symptoms have also responded to domperidone. If she has symptoms that persist despite these measures, we could consider another trial of domperidone however she would have to have close monitoring of EKG / QT interval. She agreed. Will await response to Rifaximin. Follow up PRN.   Ileene Patrick, MD [facility] Pager [phone]

## 2015-07-05 LAB — TISSUE TRANSGLUTAMINASE, IGA: Tissue Transglutaminase Ab, IgA: 1 U/mL (ref <4)

## 2015-07-19 ENCOUNTER — Ambulatory Visit: Payer: Medicare Other | Admitting: Cardiovascular Disease

## 2015-07-22 ENCOUNTER — Encounter: Payer: Self-pay | Admitting: Cardiovascular Disease

## 2015-07-22 ENCOUNTER — Ambulatory Visit (INDEPENDENT_AMBULATORY_CARE_PROVIDER_SITE_OTHER): Payer: Medicare Other | Admitting: Cardiovascular Disease

## 2015-07-22 VITALS — BP 128/72 | HR 71 | Ht 64.0 in | Wt 231.0 lb

## 2015-07-22 DIAGNOSIS — E785 Hyperlipidemia, unspecified: Secondary | ICD-10-CM

## 2015-07-22 DIAGNOSIS — I25768 Atherosclerosis of bypass graft of coronary artery of transplanted heart with other forms of angina pectoris: Secondary | ICD-10-CM | POA: Diagnosis not present

## 2015-07-22 DIAGNOSIS — I10 Essential (primary) hypertension: Secondary | ICD-10-CM

## 2015-07-22 DIAGNOSIS — R079 Chest pain, unspecified: Secondary | ICD-10-CM | POA: Diagnosis not present

## 2015-07-22 MED ORDER — RANOLAZINE ER 500 MG PO TB12: 500.0000 mg | ORAL_TABLET | Freq: Two times a day (BID) | ORAL | Status: DC

## 2015-07-22 MED ORDER — RANOLAZINE ER 1000 MG PO TB12: 1000.0000 mg | ORAL_TABLET | Freq: Two times a day (BID) | ORAL | Status: DC

## 2015-07-22 NOTE — Addendum Note (Signed)
Addended by: Lesle Chris on: 07/22/2015 02:27 PM   Modules accepted: Orders

## 2015-07-22 NOTE — Progress Notes (Signed)
Patient ID: [name], female   DOB: February 18, 1966, 50 y.o.   MRN: [id number]      SUBJECTIVE:  The patient is a 50 year old woman with a history of coronary artery disease and CABG. This is my first time meeting her. She is a former patient of Dr. Myrtis Ser. She reportedly has a history of aggressive coronary artery disease. She underwent coronary angiography in 2014 and all of her bypass grafts were patent at that time. She has normal left ventricular systolic function. She also has a history of left sided chest wall muscle spasm which responds to muscle relaxants.  Prior to CABG she had chest tightness which she denies at present. If she walks at a slow pace she feels good but if she walks quickly she has a burning sensation in her chest alleviated with rest. Has had ankle swelling and now takes Lasix 40 mg daily.   Review of Systems: As per "subjective", otherwise negative.  Allergies  Allergen Reactions  . Metformin Anaphylaxis  . Metformin And Related Shortness Of Breath  . Penicillins Anaphylaxis    Throat swells difficulty breathing    Current Outpatient Prescriptions  Medication Sig Dispense Refill  . albuterol (PROVENTIL) (2.5 MG/3ML) 0.083% nebulizer solution Take 2.5 mg by nebulization every 4 (four) hours as needed. For wheezing.    Marland Kitchen albuterol (VENTOLIN HFA) 108 (90 BASE) MCG/ACT inhaler Inhale 2 puffs into the lungs every 6 (six) hours as needed for wheezing or shortness of breath.     . ALPRAZolam (XANAX) 0.5 MG tablet Take 1 tablet (0.5 mg total) by mouth 2 (two) times daily. 60 tablet 0  . aspirin 81 MG chewable tablet Chew 81 mg by mouth daily.    . fish oil-omega-3 fatty acids 1000 MG capsule Take 2 capsules (2 g total) by mouth 2 (two) times daily.    . Fluticasone-Salmeterol (ADVAIR DISKUS) 100-50 MCG/DOSE AEPB Inhale 1 puff into the lungs 2 (two) times daily.     . furosemide (LASIX) 40 MG tablet TAKE 1 TABLET (40 MG TOTAL) BY MOUTH DAILY. 30 tablet 6  .  hyoscyamine (LEVSIN SL) 0.125 MG SL tablet Place 1 tablet (0.125 mg total) under the tongue every 8 (eight) hours as needed. 60 tablet 0  . Insulin Human (INSULIN PUMP) SOLN Inject into the skin continuous. NOVOLOG INSULIN    . lansoprazole (PREVACID SOLUTAB) 30 MG disintegrating tablet 30 mg twice a day 30 minutes before breakfast and 30 minutes before supper 60 tablet 11  . levothyroxine (SYNTHROID, LEVOTHROID) 100 MCG tablet Take 100 mcg by mouth daily before breakfast.    . metoprolol tartrate (LOPRESSOR) 25 MG tablet Take 0.5 tablets (12.5 mg total) by mouth 2 (two) times daily. 30 tablet 6  . nitroGLYCERIN (NITROSTAT) 0.4 MG SL tablet Place 0.4 mg under the tongue every 5 (five) minutes as needed for chest pain.    . prednisoLONE acetate (PRED FORTE) 1 % ophthalmic suspension   2  . rifaximin (XIFAXAN) 550 MG TABS tablet Take 1 tablet (550 mg total) by mouth 3 (three) times daily. 42 tablet 0  . rosuvastatin (CRESTOR) 20 MG tablet Take 1 tablet (20 mg total) by mouth daily. 90 tablet 3  . tiZANidine (ZANAFLEX) 4 MG tablet Take 0.5 tablets (2 mg total) by mouth at bedtime as needed for muscle spasms. 15 tablet 1  . TRUETEST TEST test strip 1 each 5 (five) times daily as needed.   5   No current facility-administered medications for this visit.  Past Medical History  Diagnosis Date  . Anxiety   . Asthma   . Diabetes mellitus     Scheduled to go on insulin pump  . History of heart attack   . GERD (gastroesophageal reflux disease)   . Esophageal stricture   . CAD (coronary artery disease)     4v CABG, 9/10; NL LVF, status post followup Cardiolite November 2012 no ischemia ejection fraction 65%  . Dyslipidemia      LDL 22 mg percent on Crestor  . Hypothyroidism   . Hypertension   . Hx of CABG     September, 2010  . Ejection fraction     Past Surgical History  Procedure Laterality Date  . Angioplasty      with stenting   . Coronary artery bypass graft  01/26/2009  . Open heart  surgery   2010  . Cardiac surgery    . Cesarean section    . Eye surgery      Social History   Social History  . Marital Status: Single    Spouse Name: N/A  . Number of Children: 1  . Years of Education: N/A   Occupational History  . disabilied    Social History Main Topics  . Smoking status: Former Smoker -- 1.00 packs/day for 3 years    Types: Cigarettes    Quit date: 05/22/1993  . Smokeless tobacco: Never Used     Comment:  Year Quit: 1995  . Alcohol Use: No  . Drug Use: No  . Sexual Activity: Yes   Other Topics Concern  . Not on file   Social History Narrative     Filed Vitals:   07/22/15 1353  BP: 128/72  Pulse: 71  Height:  (1.626 m)  Weight: 231 lb (104.781 kg)  SpO2: 97%    PHYSICAL EXAM General: NAD HEENT: Normal. Neck: No JVD, no thyromegaly. Lungs: Clear to auscultation bilaterally with normal respiratory effort. CV: Nondisplaced PMI.  Regular rate and rhythm, normal S1/S2, no S3/S4, no murmur. No pretibial or periankle edema.  No carotid bruit.   Abdomen: Soft, obese.  Neurologic: Alert and oriented.  Psych: Normal affect. Skin: Normal. Musculoskeletal: No gross deformities.  ECG: Most recent ECG reviewed.      ASSESSMENT AND PLAN: 1. Chest pain in context of CAD with CABG: CCS class II angina. Will start Ranexa 500 mg bid x 1 week and then increase to 1000 mg bid. Continue ASA, metoprolol, and rosuvastatin.  2. Essential HTN: Controlled. No changes.  3. Hyperlipidemia: Continue statin therapy.  Dispo: f/u 6 months.  Prentice Docker, M.D., F.A.C.C.

## 2015-07-22 NOTE — Patient Instructions (Signed)
   Begin Ranexa  twice a day x 7 days, then increase to 1,000mg  twice a day thereafter.  Samples given today, will have patient call office if tolerate for prescription to pharmacy.   Continue all other medications.   Your physician wants you to follow up in: 6 months.  You will receive a reminder letter in the mail one-two months in advance.  If you don't receive a letter, please call our office to schedule the follow up appointment

## 2015-08-20 ENCOUNTER — Other Ambulatory Visit: Payer: Self-pay

## 2015-08-20 MED ORDER — LANSOPRAZOLE 30 MG PO TBDP: ORAL_TABLET | ORAL | Status: DC

## 2015-08-23 ENCOUNTER — Ambulatory Visit (INDEPENDENT_AMBULATORY_CARE_PROVIDER_SITE_OTHER): Payer: Medicare Other | Admitting: Ophthalmology

## 2015-08-23 DIAGNOSIS — E10319 Type 1 diabetes mellitus with unspecified diabetic retinopathy without macular edema: Secondary | ICD-10-CM

## 2015-08-23 DIAGNOSIS — H43813 Vitreous degeneration, bilateral: Secondary | ICD-10-CM

## 2015-08-23 DIAGNOSIS — H353132 Nonexudative age-related macular degeneration, bilateral, intermediate dry stage: Secondary | ICD-10-CM

## 2015-08-23 DIAGNOSIS — E103391 Type 1 diabetes mellitus with moderate nonproliferative diabetic retinopathy without macular edema, right eye: Secondary | ICD-10-CM | POA: Diagnosis not present

## 2015-08-23 DIAGNOSIS — H2513 Age-related nuclear cataract, bilateral: Secondary | ICD-10-CM

## 2015-08-23 DIAGNOSIS — E103592 Type 1 diabetes mellitus with proliferative diabetic retinopathy without macular edema, left eye: Secondary | ICD-10-CM | POA: Diagnosis not present

## 2015-08-23 DIAGNOSIS — H35033 Hypertensive retinopathy, bilateral: Secondary | ICD-10-CM | POA: Diagnosis not present

## 2015-08-23 DIAGNOSIS — I10 Essential (primary) hypertension: Secondary | ICD-10-CM

## 2015-09-17 ENCOUNTER — Other Ambulatory Visit: Payer: Self-pay | Admitting: Cardiology

## 2015-09-20 ENCOUNTER — Other Ambulatory Visit: Payer: Self-pay | Admitting: Cardiology

## 2015-12-22 ENCOUNTER — Emergency Department (HOSPITAL_COMMUNITY)
Admission: EM | Admit: 2015-12-22 | Discharge: 2015-12-22 | Disposition: A | Discharge status: Home / Self Care | Payer: Medicare Other | Attending: Emergency Medicine | Admitting: Emergency Medicine

## 2015-12-22 ENCOUNTER — Encounter (HOSPITAL_COMMUNITY): Payer: Self-pay | Admitting: Emergency Medicine

## 2015-12-22 DIAGNOSIS — M542 Cervicalgia: Secondary | ICD-10-CM

## 2015-12-22 DIAGNOSIS — E039 Hypothyroidism, unspecified: Secondary | ICD-10-CM | POA: Insufficient documentation

## 2015-12-22 DIAGNOSIS — Z7982 Long term (current) use of aspirin: Secondary | ICD-10-CM | POA: Insufficient documentation

## 2015-12-22 DIAGNOSIS — I2510 Atherosclerotic heart disease of native coronary artery without angina pectoris: Secondary | ICD-10-CM | POA: Diagnosis not present

## 2015-12-22 DIAGNOSIS — J45909 Unspecified asthma, uncomplicated: Secondary | ICD-10-CM | POA: Diagnosis not present

## 2015-12-22 DIAGNOSIS — Z87891 Personal history of nicotine dependence: Secondary | ICD-10-CM | POA: Insufficient documentation

## 2015-12-22 DIAGNOSIS — Z794 Long term (current) use of insulin: Secondary | ICD-10-CM | POA: Insufficient documentation

## 2015-12-22 DIAGNOSIS — E119 Type 2 diabetes mellitus without complications: Secondary | ICD-10-CM | POA: Diagnosis not present

## 2015-12-22 DIAGNOSIS — Z79899 Other long term (current) drug therapy: Secondary | ICD-10-CM | POA: Insufficient documentation

## 2015-12-22 MED ORDER — DIAZEPAM 5 MG PO TABS: 5.0000 mg | ORAL_TABLET | Freq: Four times a day (QID) | ORAL | 0 refills | Status: DC | PRN

## 2015-12-22 MED ORDER — HYDROCODONE-ACETAMINOPHEN 5-325 MG PO TABS: 1.0000 | ORAL_TABLET | ORAL | 0 refills | Status: DC | PRN
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
## 2015-12-22 NOTE — Discharge Instructions (Signed)
Take ibuprofen 400 mg 3 times a day with meals, as an anti-inflammatory medication to help the discomfort.  Use heat on the sore areas 3 or 4 times a day for 1 hour.  Make sure you are watching your carbohydrate intake, to help keep your glucose, lower. It also helps to drink several glasses of water each day, to lower your glucose level.  Follow-up with your endocrinologist about the blood sugar elevation, as scheduled.

## 2015-12-22 NOTE — ED Triage Notes (Signed)
Pt reports was bending down picking up something and reports neck pain radiating to bilateral shoulder and right arm. nad noted.
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
## 2016-01-17 ENCOUNTER — Telehealth: Payer: Self-pay | Admitting: Cardiovascular Disease

## 2016-01-17 NOTE — Telephone Encounter (Signed)
Pt aware, and [facility] will contact office if needed

## 2016-01-17 NOTE — Telephone Encounter (Signed)
Please let patient know I do not see anything in her history that would keep her from getting an MRI from a cardiac standpoint, unless she has since happened to get a pacemaker or defibrillator implanted at an outside medical facility between now and the last time she saw Dr. Kirtland BouchardK.

## 2016-01-17 NOTE — Telephone Encounter (Signed)
[name] called stating that she needs to have a MRI of her neck. Was told to check with Dr. Purvis SheffieldKoneswaran to see if it is ok to have due of her heart conditions.  [facility] # [id number]

## 2016-01-25 ENCOUNTER — Other Ambulatory Visit (HOSPITAL_COMMUNITY)
Admission: RE | Admit: 2016-01-25 | Discharge: 2016-01-25 | Disposition: A | Discharge status: Home / Self Care | Payer: Medicare Other | Source: Ambulatory Visit | Attending: Obstetrics & Gynecology | Admitting: Obstetrics & Gynecology

## 2016-01-25 ENCOUNTER — Encounter: Payer: Self-pay | Admitting: Obstetrics & Gynecology

## 2016-01-25 ENCOUNTER — Ambulatory Visit (INDEPENDENT_AMBULATORY_CARE_PROVIDER_SITE_OTHER): Payer: Medicare Other | Admitting: Obstetrics & Gynecology

## 2016-01-25 VITALS — BP 120/80 | HR 72 | Ht 62.0 in | Wt 234.0 lb

## 2016-01-25 DIAGNOSIS — Z01419 Encounter for gynecological examination (general) (routine) without abnormal findings: Secondary | ICD-10-CM

## 2016-01-25 DIAGNOSIS — Z113 Encounter for screening for infections with a predominantly sexual mode of transmission: Secondary | ICD-10-CM | POA: Insufficient documentation

## 2016-01-25 DIAGNOSIS — Z124 Encounter for screening for malignant neoplasm of cervix: Secondary | ICD-10-CM | POA: Diagnosis not present

## 2016-01-25 DIAGNOSIS — Z1151 Encounter for screening for human papillomavirus (HPV): Secondary | ICD-10-CM | POA: Diagnosis present

## 2016-01-25 NOTE — Progress Notes (Signed)
Subjective:     [name] is a 50 y.o. female here for a routine exam.  Patient's last menstrual period was 12/31/2015. No obstetric history on file. Birth Control Method:  BTL Menstrual Calendar(currently): regular  Current complaints: none.   Current acute medical issues:  DM, ASCVD   Recent Gynecologic History Patient's last menstrual period was 12/31/2015. Last Pap: 2015,  normal Last mammogram: 2014,  normal  Past Medical History:  Diagnosis Date  . Anxiety   . Asthma   . CAD (coronary artery disease)    4v CABG, 9/10; NL LVF, status post followup Cardiolite November 2012 no ischemia ejection fraction 65%  . Diabetes mellitus    Scheduled to go on insulin pump  . Dyslipidemia     LDL 22 mg percent on Crestor  . Ejection fraction   . Esophageal stricture   . GERD (gastroesophageal reflux disease)   . History of heart attack   . Hx of CABG    September, 2010  . Hypertension   . Hypothyroidism     Past Surgical History:  Procedure Laterality Date  . ANGIOPLASTY     with stenting   . CARDIAC SURGERY    . CESAREAN SECTION    . CORONARY ARTERY BYPASS GRAFT  01/26/2009  . EYE SURGERY    . open heart surgery   2010    OB History    No data available      Social History   Social History  . Marital status: Single    Spouse name: N/A  . Number of children: 1  . Years of education: N/A   Occupational History  . disabilied    Social History Main Topics  . Smoking status: Former Smoker    Packs/day: 1.00    Years: 3.00    Types: Cigarettes    Quit date: 05/22/1993  . Smokeless tobacco: Never Used     Comment:  Year Quit: 1995  . Alcohol use No  . Drug use: No  . Sexual activity: Yes   Other Topics Concern  . None   Social History Narrative  . None    Family History  Problem Relation Age of Onset  . Heart disease Father   . Diabetes Mother   . Colon cancer Neg Hx      Current Outpatient Prescriptions:  .  albuterol (PROVENTIL) (2.5  MG/3ML) 0.083% nebulizer solution, Take 2.5 mg by nebulization every 4 (four) hours as needed. For wheezing., Disp: , Rfl:  .  albuterol (VENTOLIN HFA) 108 (90 BASE) MCG/ACT inhaler, Inhale 2 puffs into the lungs every 6 (six) hours as needed for wheezing or shortness of breath. , Disp: , Rfl:  .  ALPRAZolam (XANAX) 0.5 MG tablet, Take 1 tablet (0.5 mg total) by mouth 2 (two) times daily., Disp: 60 tablet, Rfl: 0 .  aspirin 81 MG chewable tablet, Chew 81 mg by mouth daily., Disp: , Rfl:  .  fish oil-omega-3 fatty acids 1000 MG capsule, Take 2 capsules (2 g total) by mouth 2 (two) times daily., Disp: , Rfl:  .  Fluticasone-Salmeterol (ADVAIR DISKUS) 100-50 MCG/DOSE AEPB, Inhale 1 puff into the lungs 2 (two) times daily. , Disp: , Rfl:  .  furosemide (LASIX) 40 MG tablet, TAKE 1 TABLET (40 MG TOTAL) BY MOUTH DAILY., Disp: 30 tablet, Rfl: 6 .  hyoscyamine (LEVSIN SL) 0.125 MG SL tablet, Place 1 tablet (0.125 mg total) under the tongue every 8 (eight) hours as needed., Disp: 60 tablet,  Rfl: 0 .  Insulin Human (INSULIN PUMP) SOLN, Inject into the skin continuous. NOVOLOG INSULIN, Disp: , Rfl:  .  lansoprazole (PREVACID SOLUTAB) 30 MG disintegrating tablet, 30 mg twice a day 30 minutes before breakfast and 30 minutes before supper, Disp: 60 tablet, Rfl: 11 .  levothyroxine (SYNTHROID, LEVOTHROID) 100 MCG tablet, Take 100 mcg by mouth daily before breakfast., Disp: , Rfl:  .  metoprolol tartrate (LOPRESSOR) 25 MG tablet, TAKE 0.5 TABLETS (12.5 MG TOTAL) BY MOUTH 2 (TWO) TIMES DAILY., Disp: 30 tablet, Rfl: 6 .  rosuvastatin (CRESTOR) 20 MG tablet, Take 1 tablet (20 mg total) by mouth daily., Disp: 90 tablet, Rfl: 3 .  tiZANidine (ZANAFLEX) 4 MG tablet, Take 0.5 tablets (2 mg total) by mouth at bedtime as needed for muscle spasms., Disp: 15 tablet, Rfl: 1 .  TRUETEST TEST test strip, 1 each 5 (five) times daily as needed. , Disp: , Rfl: 5 .  nitroGLYCERIN (NITROSTAT) 0.4 MG SL tablet, Place 0.4 mg under the  tongue every 5 (five) minutes as needed for chest pain., Disp: , Rfl:  .  prednisoLONE acetate (PRED FORTE) 1 % ophthalmic suspension, , Disp: , Rfl: 2  Review of Systems  Review of Systems  Constitutional: Negative for fever, chills, weight loss, malaise/fatigue and diaphoresis.  HENT: Negative for hearing loss, ear pain, nosebleeds, congestion, sore throat, neck pain, tinnitus and ear discharge.   Eyes: Negative for blurred vision, double vision, photophobia, pain, discharge and redness.  Respiratory: Negative for cough, hemoptysis, sputum production, shortness of breath, wheezing and stridor.   Cardiovascular: Negative for chest pain, palpitations, orthopnea, claudication, leg swelling and PND.  Gastrointestinal: negative for abdominal pain. Negative for heartburn, nausea, vomiting, diarrhea, constipation, blood in stool and melena.  Genitourinary: Negative for dysuria, urgency, frequency, hematuria and flank pain.  Musculoskeletal: Negative for myalgias, back pain, joint pain and falls.  Skin: Negative for itching and rash.  Neurological: Negative for dizziness, tingling, tremors, sensory change, speech change, focal weakness, seizures, loss of consciousness, weakness and headaches.  Endo/Heme/Allergies: Negative for environmental allergies and polydipsia. Does not bruise/bleed easily.  Psychiatric/Behavioral: Negative for depression, suicidal ideas, hallucinations, memory loss and substance abuse. The patient is not nervous/anxious and does not have insomnia.        Objective:  Blood pressure 120/80, pulse 72, height 5\' 2"  (1.575 m), weight 234 lb (106.1 kg), last menstrual period 12/31/2015.   Physical Exam  Vitals reviewed. Constitutional: She is oriented to person, place, and time. She appears well-developed and well-nourished.  HENT:  Head: Normocephalic and atraumatic.        Right Ear: External ear normal.  Left Ear: External ear normal.  Nose: Nose normal.  Mouth/Throat:  Oropharynx is clear and moist.  Eyes: Conjunctivae and EOM are normal. Pupils are equal, round, and reactive to light. Right eye exhibits no discharge. Left eye exhibits no discharge. No scleral icterus.  Neck: Normal range of motion. Neck supple. No tracheal deviation present. No thyromegaly present.  Cardiovascular: Normal rate, regular rhythm, normal heart sounds and intact distal pulses.  Exam reveals no gallop and no friction rub.   No murmur heard. Respiratory: Effort normal and breath sounds normal. No respiratory distress. She has no wheezes. She has no rales. She exhibits no tenderness.  GI: Soft. Bowel sounds are normal. She exhibits no distension and no mass. There is no tenderness. There is no rebound and no guarding.  Genitourinary:  Breasts no masses skin changes or nipple changes bilaterally  Vulva is normal without lesions Vagina is pink moist without discharge Cervix normal in appearance and pap is done Uterus is normal size shape and contour Adnexa is negative with normal sized ovaries   Musculoskeletal: Normal range of motion. She exhibits no edema and no tenderness.  Neurological: She is alert and oriented to person, place, and time. She has normal reflexes. She displays normal reflexes. No cranial nerve deficit. She exhibits normal muscle tone. Coordination normal.  Skin: Skin is warm and dry. No rash noted. No erythema. No pallor.  Psychiatric: She has a normal mood and affect. Her behavior is normal. Judgment and thought content normal.       Medications Ordered at today's visit: No orders of the defined types were placed in this encounter.   Other orders placed at today's visit: No orders of the defined types were placed in this encounter.     Assessment:    Healthy female exam.    Plan:    Contraception: tubal ligation. Mammogram ordered. Follow up in: 2 years.     No Follow-up on file.

## 2016-01-25 NOTE — Addendum Note (Signed)
Addended by: Federico FlakeNES, Finnlee Guarnieri A on: 01/25/2016 04:49 PM   Modules accepted: Orders

## 2016-01-27 LAB — CYTOLOGY - PAP

## 2016-02-01 ENCOUNTER — Other Ambulatory Visit: Payer: Self-pay | Admitting: Cardiology

## 2016-02-24 ENCOUNTER — Ambulatory Visit (INDEPENDENT_AMBULATORY_CARE_PROVIDER_SITE_OTHER): Payer: Medicare Other | Admitting: Ophthalmology

## 2016-02-24 DIAGNOSIS — H43813 Vitreous degeneration, bilateral: Secondary | ICD-10-CM | POA: Diagnosis not present

## 2016-02-24 DIAGNOSIS — E10311 Type 1 diabetes mellitus with unspecified diabetic retinopathy with macular edema: Secondary | ICD-10-CM | POA: Diagnosis not present

## 2016-02-24 DIAGNOSIS — E103391 Type 1 diabetes mellitus with moderate nonproliferative diabetic retinopathy without macular edema, right eye: Secondary | ICD-10-CM | POA: Diagnosis not present

## 2016-02-24 DIAGNOSIS — H35033 Hypertensive retinopathy, bilateral: Secondary | ICD-10-CM | POA: Diagnosis not present

## 2016-02-24 DIAGNOSIS — E103592 Type 1 diabetes mellitus with proliferative diabetic retinopathy without macular edema, left eye: Secondary | ICD-10-CM | POA: Diagnosis not present

## 2016-02-24 DIAGNOSIS — I10 Essential (primary) hypertension: Secondary | ICD-10-CM

## 2016-04-10 ENCOUNTER — Ambulatory Visit: Payer: Medicare Other | Admitting: Gastroenterology

## 2016-04-13 ENCOUNTER — Encounter (HOSPITAL_COMMUNITY): Payer: Self-pay | Admitting: *Deleted

## 2016-04-13 ENCOUNTER — Observation Stay (HOSPITAL_COMMUNITY): Payer: Medicare Other

## 2016-04-13 ENCOUNTER — Other Ambulatory Visit: Payer: Self-pay

## 2016-04-13 ENCOUNTER — Emergency Department (HOSPITAL_COMMUNITY): Payer: Medicare Other

## 2016-04-13 ENCOUNTER — Inpatient Hospital Stay (HOSPITAL_COMMUNITY)
Admission: EM | Admit: 2016-04-13 | Discharge: 2016-04-16 | DRG: 074 | Disposition: A | Discharge status: Home / Self Care | Payer: Medicare Other | Attending: Internal Medicine | Admitting: Internal Medicine

## 2016-04-13 DIAGNOSIS — E118 Type 2 diabetes mellitus with unspecified complications: Secondary | ICD-10-CM | POA: Diagnosis not present

## 2016-04-13 DIAGNOSIS — Z87891 Personal history of nicotine dependence: Secondary | ICD-10-CM

## 2016-04-13 DIAGNOSIS — Z951 Presence of aortocoronary bypass graft: Secondary | ICD-10-CM

## 2016-04-13 DIAGNOSIS — F419 Anxiety disorder, unspecified: Secondary | ICD-10-CM | POA: Diagnosis present

## 2016-04-13 DIAGNOSIS — R140 Abdominal distension (gaseous): Secondary | ICD-10-CM | POA: Diagnosis present

## 2016-04-13 DIAGNOSIS — Z7982 Long term (current) use of aspirin: Secondary | ICD-10-CM

## 2016-04-13 DIAGNOSIS — E1143 Type 2 diabetes mellitus with diabetic autonomic (poly)neuropathy: Secondary | ICD-10-CM | POA: Diagnosis not present

## 2016-04-13 DIAGNOSIS — K3184 Gastroparesis: Secondary | ICD-10-CM | POA: Diagnosis not present

## 2016-04-13 DIAGNOSIS — R0789 Other chest pain: Secondary | ICD-10-CM

## 2016-04-13 DIAGNOSIS — K219 Gastro-esophageal reflux disease without esophagitis: Secondary | ICD-10-CM | POA: Diagnosis present

## 2016-04-13 DIAGNOSIS — R079 Chest pain, unspecified: Secondary | ICD-10-CM

## 2016-04-13 DIAGNOSIS — K210 Gastro-esophageal reflux disease with esophagitis: Secondary | ICD-10-CM | POA: Diagnosis present

## 2016-04-13 DIAGNOSIS — Z880 Allergy status to penicillin: Secondary | ICD-10-CM

## 2016-04-13 DIAGNOSIS — Z833 Family history of diabetes mellitus: Secondary | ICD-10-CM

## 2016-04-13 DIAGNOSIS — Z8249 Family history of ischemic heart disease and other diseases of the circulatory system: Secondary | ICD-10-CM

## 2016-04-13 DIAGNOSIS — E785 Hyperlipidemia, unspecified: Secondary | ICD-10-CM | POA: Diagnosis present

## 2016-04-13 DIAGNOSIS — I10 Essential (primary) hypertension: Secondary | ICD-10-CM | POA: Diagnosis present

## 2016-04-13 DIAGNOSIS — Z955 Presence of coronary angioplasty implant and graft: Secondary | ICD-10-CM

## 2016-04-13 DIAGNOSIS — E039 Hypothyroidism, unspecified: Secondary | ICD-10-CM | POA: Diagnosis present

## 2016-04-13 DIAGNOSIS — Z9641 Presence of insulin pump (external) (internal): Secondary | ICD-10-CM | POA: Diagnosis present

## 2016-04-13 DIAGNOSIS — Z888 Allergy status to other drugs, medicaments and biological substances status: Secondary | ICD-10-CM

## 2016-04-13 DIAGNOSIS — I252 Old myocardial infarction: Secondary | ICD-10-CM

## 2016-04-13 DIAGNOSIS — Z79899 Other long term (current) drug therapy: Secondary | ICD-10-CM

## 2016-04-13 DIAGNOSIS — I2510 Atherosclerotic heart disease of native coronary artery without angina pectoris: Secondary | ICD-10-CM | POA: Diagnosis present

## 2016-04-13 DIAGNOSIS — Z6839 Body mass index (BMI) 39.0-39.9, adult: Secondary | ICD-10-CM

## 2016-04-13 DIAGNOSIS — E119 Type 2 diabetes mellitus without complications: Secondary | ICD-10-CM

## 2016-04-13 DIAGNOSIS — I25810 Atherosclerosis of coronary artery bypass graft(s) without angina pectoris: Secondary | ICD-10-CM | POA: Diagnosis present

## 2016-04-13 DIAGNOSIS — Z794 Long term (current) use of insulin: Secondary | ICD-10-CM

## 2016-04-13 DIAGNOSIS — E669 Obesity, unspecified: Secondary | ICD-10-CM | POA: Diagnosis present

## 2016-04-13 DIAGNOSIS — J45909 Unspecified asthma, uncomplicated: Secondary | ICD-10-CM | POA: Diagnosis present

## 2016-04-13 LAB — TROPONIN I
TROPONIN I: 0.04 ng/mL — AB (ref <0.03)
Troponin I: <0.03 ng/mL (ref <0.03)
Troponin I: <0.03 ng/mL (ref <0.03)

## 2016-04-13 LAB — BASIC METABOLIC PANEL
Anion gap: 10 (ref 5–15)
BUN: 9 mg/dL (ref 6–20)
CALCIUM: 9.8 mg/dL (ref 8.9–10.3)
CO2: 24 mmol/L (ref 22–32)
Chloride: 102 mmol/L (ref 101–111)
Creatinine, Ser: 0.76 mg/dL (ref 0.44–1.00)
GFR calc Af Amer: >60 mL/min (ref >60)
GLUCOSE: 139 mg/dL — AB (ref 65–99)
Potassium: 3.5 mmol/L (ref 3.5–5.1)
SODIUM: 136 mmol/L (ref 135–145)

## 2016-04-13 LAB — URINALYSIS, ROUTINE W REFLEX MICROSCOPIC
BILIRUBIN URINE: NEGATIVE
Glucose, UA: NEGATIVE mg/dL
HGB URINE DIPSTICK: NEGATIVE
KETONES UR: NEGATIVE mg/dL
Leukocytes, UA: NEGATIVE
Nitrite: NEGATIVE
PROTEIN: NEGATIVE mg/dL
Specific Gravity, Urine: 1.009 (ref 1.005–1.030)
pH: 7.5 (ref 5.0–8.0)

## 2016-04-13 LAB — HEPATIC FUNCTION PANEL
ALT: 12 U/L — AB (ref 14–54)
AST: 18 U/L (ref 15–41)
Albumin: 3.7 g/dL (ref 3.5–5.0)
Alkaline Phosphatase: 102 U/L (ref 38–126)
Total Bilirubin: 0.5 mg/dL (ref 0.3–1.2)
Total Protein: 6.9 g/dL (ref 6.5–8.1)

## 2016-04-13 LAB — CBG MONITORING, ED
GLUCOSE-CAPILLARY: 134 mg/dL — AB (ref 65–99)
Glucose-Capillary: 127 mg/dL — ABNORMAL HIGH (ref 65–99)

## 2016-04-13 LAB — GLUCOSE, CAPILLARY
GLUCOSE-CAPILLARY: 135 mg/dL — AB (ref 65–99)
GLUCOSE-CAPILLARY: 131 mg/dL — AB (ref 65–99)
GLUCOSE-CAPILLARY: 123 mg/dL — AB (ref 65–99)

## 2016-04-13 LAB — CBC
HCT: 41.9 % (ref 36.0–46.0)
Hemoglobin: 14.2 g/dL (ref 12.0–15.0)
MCH: 28.1 pg (ref 26.0–34.0)
MCHC: 33.9 g/dL (ref 30.0–36.0)
MCV: 82.8 fL (ref 78.0–100.0)
PLATELETS: 330 10*3/uL (ref 150–400)
RBC: 5.06 MIL/uL (ref 3.87–5.11)
RDW: 15.2 % (ref 11.5–15.5)
WBC: 11 10*3/uL — AB (ref 4.0–10.5)

## 2016-04-13 MED ORDER — ALBUTEROL SULFATE (2.5 MG/3ML) 0.083% IN NEBU: 2.5000 mg | INHALATION_SOLUTION | RESPIRATORY_TRACT | Status: DC | PRN

## 2016-04-13 MED ORDER — TIZANIDINE HCL 2 MG PO TABS
2.0000 mg | ORAL_TABLET | Freq: Every evening | ORAL | Status: DC | PRN
  Administered 2016-04-15: 2 mg via ORAL
  Filled 2016-04-13 (×2): qty 1

## 2016-04-13 MED ORDER — NITROGLYCERIN 0.4 MG SL SUBL
0.4000 mg | SUBLINGUAL_TABLET | SUBLINGUAL | Status: DC | PRN
Start: 2016-04-13 — End: 2016-04-16

## 2016-04-13 MED ORDER — ACETAMINOPHEN 325 MG PO TABS
650.0000 mg | ORAL_TABLET | ORAL | Status: DC | PRN
  Administered 2016-04-13: 650 mg via ORAL
  Filled 2016-04-13: qty 2

## 2016-04-13 MED ORDER — ASPIRIN 81 MG PO CHEW
324.0000 mg | CHEWABLE_TABLET | Freq: Once | ORAL | Status: AC
  Administered 2016-04-13: 324 mg via ORAL
  Filled 2016-04-13: qty 4

## 2016-04-13 MED ORDER — METOPROLOL TARTRATE 12.5 MG HALF TABLET
12.5000 mg | ORAL_TABLET | Freq: Every day | ORAL | Status: DC
  Administered 2016-04-13 – 2016-04-16 (×4): 12.5 mg via ORAL
  Filled 2016-04-13 (×4): qty 1

## 2016-04-13 MED ORDER — ONDANSETRON HCL 4 MG/2ML IJ SOLN: 4.0000 mg | Freq: Four times a day (QID) | INTRAMUSCULAR | Status: DC | PRN

## 2016-04-13 MED ORDER — ENOXAPARIN SODIUM 40 MG/0.4ML ~~LOC~~ SOLN
40.0000 mg | SUBCUTANEOUS | Status: DC
  Administered 2016-04-13 – 2016-04-16 (×4): 40 mg via SUBCUTANEOUS
  Filled 2016-04-13 (×4): qty 0.4

## 2016-04-13 MED ORDER — PANTOPRAZOLE SODIUM 40 MG IV SOLR
40.0000 mg | Freq: Once | INTRAVENOUS | Status: AC
  Administered 2016-04-13: 40 mg via INTRAVENOUS
  Filled 2016-04-13: qty 40

## 2016-04-13 MED ORDER — ASPIRIN 81 MG PO CHEW
81.0000 mg | CHEWABLE_TABLET | Freq: Every day | ORAL | Status: DC
  Administered 2016-04-14 – 2016-04-16 (×3): 81 mg via ORAL
  Filled 2016-04-13 (×3): qty 1

## 2016-04-13 MED ORDER — ALBUTEROL SULFATE (2.5 MG/3ML) 0.083% IN NEBU: 2.5000 mg | INHALATION_SOLUTION | Freq: Four times a day (QID) | RESPIRATORY_TRACT | Status: DC | PRN

## 2016-04-13 MED ORDER — ALPRAZOLAM 0.5 MG PO TABS
0.5000 mg | ORAL_TABLET | Freq: Every day | ORAL | Status: DC
  Administered 2016-04-13 – 2016-04-16 (×4): 0.5 mg via ORAL
  Filled 2016-04-13 (×4): qty 1

## 2016-04-13 MED ORDER — POTASSIUM CHLORIDE IN NACL 20-0.9 MEQ/L-% IV SOLN
INTRAVENOUS | Status: DC
  Administered 2016-04-13 – 2016-04-15 (×2): via INTRAVENOUS
  Filled 2016-04-13 (×2): qty 1000

## 2016-04-13 MED ORDER — LEVOTHYROXINE SODIUM 100 MCG PO TABS
100.0000 ug | ORAL_TABLET | Freq: Every day | ORAL | Status: DC
  Administered 2016-04-14 – 2016-04-16 (×3): 100 ug via ORAL
  Filled 2016-04-13 (×6): qty 1

## 2016-04-13 MED ORDER — ALBUTEROL SULFATE HFA 108 (90 BASE) MCG/ACT IN AERS: 2.0000 | INHALATION_SPRAY | Freq: Four times a day (QID) | RESPIRATORY_TRACT | Status: DC | PRN

## 2016-04-13 MED ORDER — LANSOPRAZOLE 15 MG PO TBDP
30.0000 mg | ORAL_TABLET | Freq: Every day | ORAL | Status: DC
  Administered 2016-04-13 – 2016-04-16 (×4): 30 mg via ORAL
  Filled 2016-04-13 (×5): qty 2

## 2016-04-13 MED ORDER — MOMETASONE FURO-FORMOTEROL FUM 100-5 MCG/ACT IN AERO
2.0000 | INHALATION_SPRAY | Freq: Two times a day (BID) | RESPIRATORY_TRACT | Status: DC
  Administered 2016-04-15 – 2016-04-16 (×2): 2 via RESPIRATORY_TRACT
  Filled 2016-04-13: qty 8.8

## 2016-04-13 MED ORDER — INSULIN PUMP
SUBCUTANEOUS | Status: DC
  Administered 2016-04-14 – 2016-04-15 (×4): via SUBCUTANEOUS
  Administered 2016-04-16: 6 via SUBCUTANEOUS
  Administered 2016-04-16: 10 via SUBCUTANEOUS
  Filled 2016-04-13: qty 1

## 2016-04-13 MED ORDER — ASPIRIN 81 MG PO CHEW: 81.0000 mg | CHEWABLE_TABLET | Freq: Every day | ORAL | Status: DC

## 2016-04-13 MED ORDER — MORPHINE SULFATE (PF) 4 MG/ML IV SOLN
4.0000 mg | Freq: Once | INTRAVENOUS | Status: AC
  Administered 2016-04-13: 4 mg via INTRAVENOUS
  Filled 2016-04-13: qty 1

## 2016-04-13 MED ORDER — ROSUVASTATIN CALCIUM 10 MG PO TABS
20.0000 mg | ORAL_TABLET | Freq: Every day | ORAL | Status: DC
  Administered 2016-04-13 – 2016-04-16 (×4): 20 mg via ORAL
  Filled 2016-04-13 (×4): qty 2

## 2016-04-13 MED ORDER — OMEGA-3-ACID ETHYL ESTERS 1 G PO CAPS
2.0000 g | ORAL_CAPSULE | Freq: Two times a day (BID) | ORAL | Status: DC
  Administered 2016-04-13 – 2016-04-16 (×4): 2 g via ORAL
  Filled 2016-04-13 (×6): qty 2

## 2016-04-13 NOTE — ED Notes (Signed)
Patient transported to Ultrasound 

## 2016-04-13 NOTE — Progress Notes (Signed)
Patient admitted to 2W38 from ED, A&Ox4, VSS. Patient placed on tele and CCMD notified. Oriented to room and call light placed within reach. Patient with no complaints of chest pain at this time.

## 2016-04-13 NOTE — Progress Notes (Signed)
Received signout from Ellin Sabaiffany Green PA-C  Ms. [name] is a 50 year old female with significant cardiac history; who presents with complaints of intermittent abdominal distention/bloating, lightheadedness/ dizziness, and chest pressure/tightness. Lab work revealed WBC 11, troponin 0.04, no significant EKG changes, CXR neg, UA neg, and all other lab work relatively wnl. Admitting to telemetry/stepdown bed as chest symptoms intermittently occurring.

## 2016-04-13 NOTE — ED Provider Notes (Signed)
[facility]-EMERGENCY DEPT Provider Note   CSN: [id number] Arrival date & time: 04/13/16  0206  History   Chief Complaint Chief Complaint  Patient presents with  . Chest Pain   HPI [name] is a 50 y.o. female.  HPI   Pt has PMH of anxiety, asthma, CAD, diabetes, dyslipidemia, hx CABG, repeat 2014 cath with patent arteries, hypertension, hypothyroidism, GERD, chest wall muscle spasm.  Patient to the ER complaining of abdominal bloating for the past 3-4 days, she states that this happens 2 times a year and she usually takes abx? To fix it.  She has been nauseated, lump in her throat, feeling as though she might pass out. She has know significant cardiac disease. She admits that this does in some ways mimic previous cardiac events that she has had in the past but the bloating and lump in her throat is different.  Pt is currently having chest pain.  Past Medical History:  Diagnosis Date  . Anxiety   . Asthma   . CAD (coronary artery disease)    4v CABG, 9/10; NL LVF, status post followup Cardiolite November 2012 no ischemia ejection fraction 65%  . Diabetes mellitus    Scheduled to go on insulin pump  . Dyslipidemia     LDL 22 mg percent on Crestor  . Ejection fraction   . Esophageal stricture   . GERD (gastroesophageal reflux disease)   . History of heart attack   . Hx of CABG    September, 2010  . Hypertension   . Hypothyroidism     Patient Active Problem List   Diagnosis Date Noted  . Muscle spasm 08/20/2014  . Obesity, unspecified 08/03/2013  . Hx of CABG   . Ejection fraction   . HTN (hypertension) 08/22/2012  . Hypothyroidism 09/29/2011  . Edema 08/25/2011  . Gastroparesis 09/07/2010  . Hyperlipidemia 02/21/2010  . Muscle spasm of left shoulder 04/29/2009  . PALPITATIONS 04/12/2009  . WHEEZING 04/12/2009  . GERD 01/19/2009  . DIABETES MELLITUS 06/12/2007  . ANXIETY 06/12/2007  . Coronary artery disease involving coronary bypass graft of native heart  06/12/2007  . ASTHMA 06/12/2007    Past Surgical History:  Procedure Laterality Date  . ANGIOPLASTY     with stenting   . CARDIAC SURGERY    . CESAREAN SECTION    . CORONARY ARTERY BYPASS GRAFT  01/26/2009  . EYE SURGERY    . open heart surgery   2010    OB History    No data available       Home Medications    Prior to Admission medications   Medication Sig Start Date End Date Taking? Authorizing Provider  albuterol (PROVENTIL) (2.5 MG/3ML) 0.083% nebulizer solution Take 2.5 mg by nebulization every 4 (four) hours as needed. For wheezing.    Historical Provider, MD  albuterol (VENTOLIN HFA) 108 (90 BASE) MCG/ACT inhaler Inhale 2 puffs into the lungs every 6 (six) hours as needed for wheezing or shortness of breath.  10/22/12   Historical Provider, MD  ALPRAZolam Prudy Feeler) 0.5 MG tablet Take 1 tablet (0.5 mg total) by mouth 2 (two) times daily. 02/17/15   Louis Meckel, MD  aspirin 81 MG chewable tablet Chew 81 mg by mouth daily.    Historical Provider, MD  fish oil-omega-3 fatty acids 1000 MG capsule Take 2 capsules (2 g total) by mouth 2 (two) times daily. 07/03/12   Rande Brunt, PA-C  Fluticasone-Salmeterol (ADVAIR DISKUS) 100-50 MCG/DOSE AEPB Inhale 1 puff  into the lungs 2 (two) times daily.     Historical Provider, MD  furosemide (LASIX) 40 MG tablet TAKE 1 TABLET (40 MG TOTAL) BY MOUTH DAILY. 02/04/15   Luis AbedJeffrey D Katz, MD  hyoscyamine (LEVSIN SL) 0.125 MG SL tablet Place 1 tablet (0.125 mg total) under the tongue every 8 (eight) hours as needed. 07/02/15   Ruffin FrederickSteven Paul Armbruster, MD  Insulin Human (INSULIN PUMP) SOLN Inject into the skin continuous. NOVOLOG INSULIN    Historical Provider, MD  lansoprazole (PREVACID SOLUTAB) 30 MG disintegrating tablet 30 mg twice a day 30 minutes before breakfast and 30 minutes before supper 08/20/15   Ruffin FrederickSteven Paul Armbruster, MD  levothyroxine (SYNTHROID, LEVOTHROID) 100 MCG tablet Take 100 mcg by mouth daily before breakfast.    Historical  Provider, MD  metoprolol tartrate (LOPRESSOR) 25 MG tablet TAKE 0.5 TABLETS (12.5 MG TOTAL) BY MOUTH 2 (TWO) TIMES DAILY. 09/17/15   Laqueta LindenSuresh A Koneswaran, MD  metoprolol tartrate (LOPRESSOR) 25 MG tablet TAKE 0.5 TABLETS (12.5 MG TOTAL) BY MOUTH 2 (TWO) TIMES DAILY. 02/01/16   Laqueta LindenSuresh A Koneswaran, MD  nitroGLYCERIN (NITROSTAT) 0.4 MG SL tablet Place 0.4 mg under the tongue every 5 (five) minutes as needed for chest pain.    Historical Provider, MD  prednisoLONE acetate (PRED FORTE) 1 % ophthalmic suspension  06/10/14   Historical Provider, MD  rosuvastatin (CRESTOR) 20 MG tablet Take 1 tablet (20 mg total) by mouth daily. 05/19/15   Laqueta LindenSuresh A Koneswaran, MD  tiZANidine (ZANAFLEX) 4 MG tablet Take 0.5 tablets (2 mg total) by mouth at bedtime as needed for muscle spasms. 01/20/15   Luis AbedJeffrey D Katz, MD  TRUETEST TEST test strip 1 each 5 (five) times daily as needed.  08/04/14   Historical Provider, MD    Family History Family History  Problem Relation Age of Onset  . Heart disease Father   . Diabetes Mother   . Colon cancer Neg Hx     Social History Social History  Substance Use Topics  . Smoking status: Former Smoker    Packs/day: 1.00    Years: 3.00    Types: Cigarettes    Quit date: 05/22/1993  . Smokeless tobacco: Never Used     Comment:  Year Quit: 1995  . Alcohol use No     Allergies   Metformin; Metformin and related; and Penicillins   Review of Systems Review of Systems Review of Systems All other systems negative except as documented in the HPI. All pertinent positives and negatives as reviewed in the HPI.   Physical Exam Updated Vital Signs BP 139/69   Pulse 81   Temp 97.5 F (36.4 C)   Resp 23   Ht 5\' 4"  (1.626 m)   Wt 105.7 kg   LMP 03/30/2016   SpO2 100%   BMI 39.99 kg/m   Physical Exam  Constitutional: She appears well-developed and well-nourished.  No distress.  HENT:  Head: Normocephalic and atraumatic.  Eyes: Conjunctivae are normal. Pupils are equal,  round, and reactive to light.  Neck: Trachea normal, normal range of motion and full passive range of motion without pain. Neck supple.  Cardiovascular: Normal rate, regular rhythm and normal pulses.   Pulmonary/Chest: Effort normal and breath sounds normal. Chest wall is not dull to percussion. She exhibits no tenderness, no crepitus, no edema, no deformity and no retraction.  Abdominal: Soft. Normal appearance and bowel sounds are normal. She exhibits distension (soft).  Musculoskeletal: Normal range of motion.  No LE swelling  Neurological: She is alert. She has normal strength.  Skin: Skin is warm, dry and intact.  Psychiatric: She has a normal mood and affect. Her speech is normal and behavior is normal. Judgment and thought content normal. Cognition and memory are normal.  Nursing note and vitals reviewed.   ED Treatments / Results  Labs (all labs ordered are listed, but only abnormal results are displayed) Labs Reviewed  BASIC METABOLIC PANEL - Abnormal; Notable for the following:       Result Value   Glucose, Bld 139 (*)    All other components within normal limits  CBC - Abnormal; Notable for the following:    WBC 11.0 (*)    All other components within normal limits  TROPONIN I - Abnormal; Notable for the following:    Troponin I 0.04 (*)    All other components within normal limits  CBG MONITORING, ED - Abnormal; Notable for the following:    Glucose-Capillary 134 (*)    All other components within normal limits  TROPONIN I  URINALYSIS, ROUTINE W REFLEX MICROSCOPIC (NOT AT [facility])    EKG  EKG Interpretation None       Radiology Dg Chest 2 View  Result Date: 04/13/2016 CLINICAL DATA:  Chest tightness, shortness of breath, and feeling of lump in throat for 4 days. Abdominal bloating. History of hypertension and diabetes. Nonsmoker. EXAM: CHEST  2 VIEW COMPARISON:  06/16/2013 FINDINGS: Postoperative changes in the mediastinum. Normal heart size and pulmonary  vascularity. No focal airspace disease or consolidation in the lungs. No blunting of costophrenic angles. No pneumothorax. Mediastinal contours appear intact. Mild degenerative changes in the spine. IMPRESSION: No active cardiopulmonary disease. Electronically Signed   By: Burman NievesWilliam  Stevens M.D.   On: 04/13/2016 02:52   Procedures Procedures (including critical care time)  Medications Ordered in ED Medications  nitroGLYCERIN (NITROSTAT) SL tablet 0.4 mg (not administered)  pantoprazole (PROTONIX) injection 40 mg (40 mg Intravenous Given 04/13/16 [id number])  aspirin chewable tablet 324 mg (324 mg Oral Given 04/13/16 0605)  morphine 4 MG/ML injection 4 mg (4 mg Intravenous Given 04/13/16 [id number])     Initial Impression / Assessment and Plan / ED Course  I have reviewed the triage vital signs and the nursing notes.  Pertinent labs & imaging results that were available during my care of the patient were reviewed by me and considered in my medical decision making (see chart for details).  Clinical Course    Patient has significant cardiac disease andw ill need her troponins to be trended. Her second one has returned slightly elevated, Dr. Mora Bellmanni requests a repeat EKG. Dr. Arlyss Queen. Leija with [facility] has agreed for admission- level of care will be pending on pain level. If patient is pain free, please place obs orders for Tele. If unable to get patient pain free then put in bed placement for stepdown.  Final Clinical Impressions(s) / ED Diagnoses   Final diagnoses:  Chest pain, unspecified type    New Prescriptions New Prescriptions   No medications on file     Marlon Peliffany Magalene Mclear, PA-C 04/13/16 [id number]    Tomasita CrumbleAdeleke Oni, MD 04/13/16 1336

## 2016-04-13 NOTE — ED Triage Notes (Signed)
The pt is c/o chest tightness with nausea and she feels like her abd is bloated   She feels like she has a lump in her throat nauseated  For 3-4 days  lmp 2 weeks ago

## 2016-04-13 NOTE — H&P (Signed)
History and Physical    [name] AVW:[id number] DOB: 08/07/1965 DOA: 04/13/2016   PCP: Elizabeth Palau, FNP   Patient coming from/Resides with: private residence  Admission status: Observation/telemetry -may need to be reevaluated within 24 hours to determine if it will be medically necessary to stay a minimum 2 midnights to rule out impending and/or unexpected changes in physiologic status that may differ from initial evaluation performed in the ER and/or at time of admission.  Presents with known CAD and prior CABG and a heart score of 3 with both typical and atypical chest pain that appears to be primarily post prandial in etiology and is associated with epigastric tenderness. She did have Kim EKG changes and mild elevation in second troponin. Patient will require cardiac rule out to include echocardiogram and cycling of troponins  Chief Complaint: chest pain  HPI: [name] is a 50 y.o. female with medical history significant for known CAD with prior CABG (per cardiologist notes has aggressive disease), s/p CABG in 2010, last cardiac catheterization 2014 demonstrated all grafts patent, diabetes mellitus on an insulin pump well-controlled last hemoglobin A1c 9, hypertension, hypothyroidism, dyslipidemia, anxiety disorder, GERD, reported gastroparesis, asthma and ongoing obesity. Patient reports that for several months after eating solids within 20-30 minutes she feels like "my food is not digesting" and has developed abdominal bloating symptoms. She has seen her primary care physician for these symptoms with recommendation for likely GI referral. Last night she ate a small amount of mashed potatoes with no immediate symptoms. She laid down and about 2 hours later she awakened with similar symptoms as described above but now associated with chest tightness. She also reports that when she has these postprandial symptoms she will occasionally get chest tightness that is accompanied by  shortness of breath. No definitive exertional chest pain or shortness of breath. In the ER her initial troponin was within normal limits with second troponin slightly elevated at 0.04. Her initial EKG did show Kim, subtle diffuse ST segment downsloping with T-wave inversion in multiple leads (see below) She was given a dose of IV Protonix, chewable aspirin and 4 mg of IV morphine with no significant improvement in her symptoms. Her pain is reproducible upon palpation of the epigastrium.  ED Course:  Vital Signs: BP 114/61   Pulse 67   Temp 97.5 F (36.4 C)   Resp 21   Ht 5\' 4"  (1.626 m)   Wt 105.7 kg (233 lb)   LMP 03/30/2016   SpO2 95%   BMI 39.99 kg/m  2 view chest x-ray: No acute process  Lab data: Sodium 136, potassium 3.5, CO2 24, BUN 9, creatinine 0.76, glucose 139, anion gap 10, troponin less than 0.03 with second troponin slightly elevated at 0.04, white count 11,000 differential not obtained, hemoglobin 14.2, platelets 330,000, urinalysis unremarkable Medications and treatments: Protonix 40 mg IV 1, chewable aspirin 324 mg 1, morphine 4 mg IV 1  Review of Systems:  In addition to the HPI above,  No Fever-chills, myalgias or other constitutional symptoms No Headache, changes with Vision or hearing, Kim weakness, tingling, numbness in any extremity, dizziness, dysarthria or word finding difficulty, gait disturbance or imbalance, tremors or seizure activity No problems swallowing food or Liquids,  choking or coughing while eating No Cough or Shortness of Breath, palpitations, orthopnea or DOE No N/V, melena,hematochezia, dark tarry stools, constipation No dysuria, malodorous urine, hematuria or flank pain No Kim skin rashes, lesions, masses or bruises, No Kim joint pains, aches, swelling or  redness No recent unintentional weight gain or loss No polyuria, polydypsia or polyphagia   Past Medical History:  Diagnosis Date  . Anxiety   . Asthma   . CAD (coronary artery  disease)    4v CABG, 9/10; NL LVF, status post followup Cardiolite November 2012 no ischemia ejection fraction 65%  . Diabetes mellitus    Scheduled to go on insulin pump  . Dyslipidemia     LDL 22 mg percent on Crestor  . Ejection fraction   . Esophageal stricture   . GERD (gastroesophageal reflux disease)   . History of heart attack   . Hx of CABG    September, 2010  . Hypertension   . Hypothyroidism     Past Surgical History:  Procedure Laterality Date  . ANGIOPLASTY     with stenting   . CARDIAC SURGERY    . CESAREAN SECTION    . CORONARY ARTERY BYPASS GRAFT  01/26/2009  . EYE SURGERY    . open heart surgery   2010    Social History   Social History  . Marital status: Single    Spouse name: N/A  . Number of children: 1  . Years of education: N/A   Occupational History  . disabilied    Social History Main Topics  . Smoking status: Former Smoker    Packs/day: 1.00    Years: 3.00    Types: Cigarettes    Quit date: 05/22/1993  . Smokeless tobacco: Never Used     Comment:  Year Quit: 1995  . Alcohol use No  . Drug use: No  . Sexual activity: Yes   Other Topics Concern  . Not on file   Social History Narrative  . No narrative on file    Mobility: Without assistive devices Work history: disabled   Allergies  Allergen Reactions  . Metformin Anaphylaxis  . Metformin And Related Shortness Of Breath  . Penicillins Anaphylaxis    Throat swells difficulty breathing    Family History  Problem Relation Age of Onset  . Heart disease Father   . Diabetes Mother   . Colon cancer Neg Hx      Prior to Admission medications   Medication Sig Start Date End Date Taking? Authorizing Provider  albuterol (PROVENTIL) (2.5 MG/3ML) 0.083% nebulizer solution Take 2.5 mg by nebulization every 4 (four) hours as needed. For wheezing.   Yes Historical Provider, MD  albuterol (VENTOLIN HFA) 108 (90 BASE) MCG/ACT inhaler Inhale 2 puffs into the lungs every 6 (six) hours as  needed for wheezing or shortness of breath.  10/22/12  Yes Historical Provider, MD  ALPRAZolam Prudy Feeler(XANAX) 0.5 MG tablet Take 1 tablet (0.5 mg total) by mouth 2 (two) times daily. Patient taking differently: Take 0.5 mg by mouth daily.  02/17/15  Yes Louis Meckelobert D Kaplan, MD  aspirin 81 MG chewable tablet Chew 81 mg by mouth daily.   Yes Historical Provider, MD  fish oil-omega-3 fatty acids 1000 MG capsule Take 2 capsules (2 g total) by mouth 2 (two) times daily. 07/03/12  Yes Rande BruntEugene C Serpe, PA-C  Fluticasone-Salmeterol (ADVAIR DISKUS) 100-50 MCG/DOSE AEPB Inhale 1 puff into the lungs 2 (two) times daily as needed (wheezing).    Yes Historical Provider, MD  furosemide (LASIX) 40 MG tablet TAKE 1 TABLET (40 MG TOTAL) BY MOUTH DAILY. 02/04/15  Yes Luis AbedJeffrey D Katz, MD  hyoscyamine (LEVSIN SL) 0.125 MG SL tablet Place 1 tablet (0.125 mg total) under the tongue every 8 (eight) hours  as needed. Patient taking differently: Place 0.125 mg under the tongue every 8 (eight) hours as needed for cramping.  07/02/15  Yes Ruffin Frederick, MD  Insulin Human (INSULIN PUMP) SOLN Inject into the skin continuous. NOVOLOG INSULIN   Yes Historical Provider, MD  lansoprazole (PREVACID SOLUTAB) 30 MG disintegrating tablet 30 mg twice a day 30 minutes before breakfast and 30 minutes before supper 08/20/15  Yes Ruffin Frederick, MD  levothyroxine (SYNTHROID, LEVOTHROID) 100 MCG tablet Take 100 mcg by mouth daily before breakfast.   Yes Historical Provider, MD  metoprolol tartrate (LOPRESSOR) 25 MG tablet TAKE 0.5 TABLETS (12.5 MG TOTAL) BY MOUTH 2 (TWO) TIMES DAILY. Patient taking differently: TAKE 0.5 TABLETS (12.5 MG TOTAL) BY MOUTH DAILY. 09/17/15  Yes Laqueta Linden, MD  nitroGLYCERIN (NITROSTAT) 0.4 MG SL tablet Place 0.4 mg under the tongue every 5 (five) minutes as needed for chest pain.   Yes Historical Provider, MD  rosuvastatin (CRESTOR) 20 MG tablet Take 1 tablet (20 mg total) by mouth daily. 05/19/15  Yes Laqueta Linden, MD  tiZANidine (ZANAFLEX) 4 MG tablet Take 0.5 tablets (2 mg total) by mouth at bedtime as needed for muscle spasms. 01/20/15  Yes Luis Abed, MD    Physical Exam: Vitals:   04/13/16 0209 04/13/16 0400 04/13/16 0615 04/13/16 0630  BP:  139/69 121/77 114/61  Pulse:  81 88 67  Resp:  23 21 21   Temp:      SpO2:  100% 98% 95%  Weight: 105.7 kg (233 lb)     Height: 5\' 4"  (1.626 m)         Constitutional: NAD, calm, comfortable Eyes: PERRL, lids and conjunctivae normal ENMT: Mucous membranes are moist. Posterior pharynx clear of any exudate or lesions.Normal dentition.  Neck: normal, supple, no masses, no thyromegaly Respiratory: clear to auscultation bilaterally, no wheezing, no crackles. Normal respiratory effort. No accessory muscle use. Tender left upper chest wall with palpation Cardiovascular: Regular rate and rhythm, no murmurs / rubs / gallops. No extremity edema. 2+ pedal pulses. No carotid bruits.  Abdomen: Epigastric tenderness reproduced with palpation , no masses palpated. No hepatosplenomegaly. Bowel sounds positive.  Musculoskeletal: no clubbing / cyanosis. No joint deformity upper and lower extremities. Good ROM, no contractures. Normal muscle tone.  Skin: no rashes, lesions, ulcers. No induration Neurologic: CN 2-12 grossly intact. Sensation intact, DTR normal. Strength 5/5 x all 4 extremities.  Psychiatric: Normal judgment and insight. Alert and oriented x 3. Normal mood.    Labs on Admission: I have personally reviewed following labs and imaging studies  CBC:  Recent Labs Lab 04/13/16 0218  WBC 11.0*  HGB 14.2  HCT 41.9  MCV 82.8  PLT 330   Basic Metabolic Panel:  Recent Labs Lab 04/13/16 0218  NA 136  K 3.5  CL 102  CO2 24  GLUCOSE 139*  BUN 9  CREATININE 0.76  CALCIUM 9.8   GFR: Estimated Creatinine Clearance: 100.9 mL/min (by C-G formula based on SCr of 0.76 mg/dL). Liver Function Tests: No results for input(s): AST, ALT,  ALKPHOS, BILITOT, PROT, ALBUMIN in the last 168 hours. No results for input(s): LIPASE, AMYLASE in the last 168 hours. No results for input(s): AMMONIA in the last 168 hours. Coagulation Profile: No results for input(s): INR, PROTIME in the last 168 hours. Cardiac Enzymes:  Recent Labs Lab 04/13/16 0218 04/13/16 0452  TROPONINI <0.03 0.04*   BNP (last 3 results) No results for input(s): PROBNP in the last  8760 hours. HbA1C: No results for input(s): HGBA1C in the last 72 hours. CBG:  Recent Labs Lab 04/13/16 0212  GLUCAP 134*   Lipid Profile: No results for input(s): CHOL, HDL, LDLCALC, TRIG, CHOLHDL, LDLDIRECT in the last 72 hours. Thyroid Function Tests: No results for input(s): TSH, T4TOTAL, FREET4, T3FREE, THYROIDAB in the last 72 hours. Anemia Panel: No results for input(s): VITAMINB12, FOLATE, FERRITIN, TIBC, IRON, RETICCTPCT in the last 72 hours. Urine analysis:    Component Value Date/Time   COLORURINE YELLOW 04/13/2016 0319   APPEARANCEUR CLEAR 04/13/2016 0319   LABSPEC 1.009 04/13/2016 0319   PHURINE 7.5 04/13/2016 0319   GLUCOSEU NEGATIVE 04/13/2016 0319   HGBUR NEGATIVE 04/13/2016 0319   BILIRUBINUR NEGATIVE 04/13/2016 0319   KETONESUR NEGATIVE 04/13/2016 0319   PROTEINUR NEGATIVE 04/13/2016 0319   UROBILINOGEN 0.2 08/21/2011 1741   NITRITE NEGATIVE 04/13/2016 0319   LEUKOCYTESUR NEGATIVE 04/13/2016 0319   Sepsis Labs: @LABRCNTIP (procalcitonin:4,lacticidven:4) )No results found for this or any previous visit (from the past 240 hour(s)).   Radiological Exams on Admission: Dg Chest 2 View  Result Date: 04/13/2016 CLINICAL DATA:  Chest tightness, shortness of breath, and feeling of lump in throat for 4 days. Abdominal bloating. History of hypertension and diabetes. Nonsmoker. EXAM: CHEST  2 VIEW COMPARISON:  06/16/2013 FINDINGS: Postoperative changes in the mediastinum. Normal heart size and pulmonary vascularity. No focal airspace disease or  consolidation in the lungs. No blunting of costophrenic angles. No pneumothorax. Mediastinal contours appear intact. Mild degenerative changes in the spine. IMPRESSION: No active cardiopulmonary disease. Electronically Signed   By: Burman Nieves M.D.   On: 04/13/2016 02:52    EKG: (Independently reviewed) Sinus rhythm with ventricular rate 100 bpm, QTC 454 ms, subtle downsloping ST segments in leads I,II,III, aVF, V2 through V6 with inverted T waves which is Kim compared to EKG from 2016  Assessment/Plan Principal Problem:   Chest pain/CAD/prior CABG 2010 -Chest pain with both typical and atypical features in a patient with known CAD with patent bypass grafts in 2014; has subtle diffuse ST segment changes and second troponin mildly elevated the chest pain has been primarily postprandial in etiology -Echo -Cycle troponin -Continue preadmission aspirin, beta blocker and statin -Besides known history has other multiple risk factors including poorly controlled diabetes -Differential includes ischemic versus pericarditis versus GI -Patient definitely has reproducible left anterior chest wall pain so continue preadmission Zanaflex  Active Problems:   GERD/Gastroparesis -Continue PPI -Chest pain is reproducible with palpation over epigastrium; ? Ulcer dz-check H. Pylori -Symptoms appear to be consistent with gastroparesis or achalasia  -NM gastric empty study in 2013 within normal limits-repeat this admission (will need to be NPO after midnight) -She has previously discussed symptoms with PCP with outpatient GI referral in process -Ruled out biliary etiology: checked LFTs (WNL)  and obtained right upper quadrant ultrasound (Hepatic steatosis) -Patient on Levsin at home so likely has a degree of irritable bowel as well    Diabetes mellitus type 2, insulin dependent  -Continue preadmission insulin pump -CBGs q hour hours -Reports most recent hemoglobin A1c 9    Asthma in adult -Continue  preadmission prn albuterol nebulizer and/or albuterol MDI; continue Advair    HTN (hypertension) -Continue Lopressor -Reports of ankle edema and apparently on Lasix daily at home but will hold for now    Hyperlipidemia -Continue Crestor and omega-3 fatty acids as diet resumed    Anxiety disorder -Continue preadmission Xanax    Hypothyroidism -Continue Synthroid  DVT prophylaxis: Lovenox Code Status: full Family Communication: mother at bedside Disposition Plan: anticipate will discharge back to preadmission home environment once medically stable Consults called: none     ELLIS,ALLISON L. ANP-BC [facility] Pager [phone]   If 7PM-7AM, please contact night-coverage [email or web address] Password [email or web address]  04/13/2016, 7:32 AM

## 2016-04-14 ENCOUNTER — Observation Stay (HOSPITAL_COMMUNITY): Payer: Medicare Other

## 2016-04-14 DIAGNOSIS — Z8249 Family history of ischemic heart disease and other diseases of the circulatory system: Secondary | ICD-10-CM | POA: Diagnosis not present

## 2016-04-14 DIAGNOSIS — R079 Chest pain, unspecified: Secondary | ICD-10-CM | POA: Diagnosis present

## 2016-04-14 DIAGNOSIS — Z7982 Long term (current) use of aspirin: Secondary | ICD-10-CM | POA: Diagnosis not present

## 2016-04-14 DIAGNOSIS — Z833 Family history of diabetes mellitus: Secondary | ICD-10-CM | POA: Diagnosis not present

## 2016-04-14 DIAGNOSIS — E785 Hyperlipidemia, unspecified: Secondary | ICD-10-CM | POA: Diagnosis present

## 2016-04-14 DIAGNOSIS — Z87891 Personal history of nicotine dependence: Secondary | ICD-10-CM | POA: Diagnosis not present

## 2016-04-14 DIAGNOSIS — Z955 Presence of coronary angioplasty implant and graft: Secondary | ICD-10-CM | POA: Diagnosis not present

## 2016-04-14 DIAGNOSIS — Z9641 Presence of insulin pump (external) (internal): Secondary | ICD-10-CM | POA: Diagnosis present

## 2016-04-14 DIAGNOSIS — Z951 Presence of aortocoronary bypass graft: Secondary | ICD-10-CM | POA: Diagnosis not present

## 2016-04-14 DIAGNOSIS — F419 Anxiety disorder, unspecified: Secondary | ICD-10-CM | POA: Diagnosis present

## 2016-04-14 DIAGNOSIS — E669 Obesity, unspecified: Secondary | ICD-10-CM | POA: Diagnosis present

## 2016-04-14 DIAGNOSIS — Z888 Allergy status to other drugs, medicaments and biological substances status: Secondary | ICD-10-CM | POA: Diagnosis not present

## 2016-04-14 DIAGNOSIS — Z79899 Other long term (current) drug therapy: Secondary | ICD-10-CM | POA: Diagnosis not present

## 2016-04-14 DIAGNOSIS — E1143 Type 2 diabetes mellitus with diabetic autonomic (poly)neuropathy: Secondary | ICD-10-CM | POA: Diagnosis present

## 2016-04-14 DIAGNOSIS — Z880 Allergy status to penicillin: Secondary | ICD-10-CM | POA: Diagnosis not present

## 2016-04-14 DIAGNOSIS — R0789 Other chest pain: Secondary | ICD-10-CM | POA: Diagnosis not present

## 2016-04-14 DIAGNOSIS — Z794 Long term (current) use of insulin: Secondary | ICD-10-CM | POA: Diagnosis not present

## 2016-04-14 DIAGNOSIS — R1310 Dysphagia, unspecified: Secondary | ICD-10-CM

## 2016-04-14 DIAGNOSIS — K3184 Gastroparesis: Secondary | ICD-10-CM | POA: Diagnosis present

## 2016-04-14 DIAGNOSIS — J45909 Unspecified asthma, uncomplicated: Secondary | ICD-10-CM | POA: Diagnosis present

## 2016-04-14 DIAGNOSIS — I10 Essential (primary) hypertension: Secondary | ICD-10-CM | POA: Diagnosis present

## 2016-04-14 DIAGNOSIS — I2510 Atherosclerotic heart disease of native coronary artery without angina pectoris: Secondary | ICD-10-CM | POA: Diagnosis present

## 2016-04-14 DIAGNOSIS — R140 Abdominal distension (gaseous): Secondary | ICD-10-CM | POA: Diagnosis present

## 2016-04-14 DIAGNOSIS — I252 Old myocardial infarction: Secondary | ICD-10-CM | POA: Diagnosis not present

## 2016-04-14 DIAGNOSIS — E039 Hypothyroidism, unspecified: Secondary | ICD-10-CM | POA: Diagnosis present

## 2016-04-14 DIAGNOSIS — K210 Gastro-esophageal reflux disease with esophagitis: Secondary | ICD-10-CM | POA: Diagnosis present

## 2016-04-14 DIAGNOSIS — Z6839 Body mass index (BMI) 39.0-39.9, adult: Secondary | ICD-10-CM | POA: Diagnosis not present

## 2016-04-14 LAB — GLUCOSE, CAPILLARY
Glucose-Capillary: 104 mg/dL — ABNORMAL HIGH (ref 65–99)
Glucose-Capillary: 106 mg/dL — ABNORMAL HIGH (ref 65–99)
Glucose-Capillary: 151 mg/dL — ABNORMAL HIGH (ref 65–99)
Glucose-Capillary: 157 mg/dL — ABNORMAL HIGH (ref 65–99)

## 2016-04-14 LAB — CBC
HEMATOCRIT: 42.7 % (ref 36.0–46.0)
HEMOGLOBIN: 14 g/dL (ref 12.0–15.0)
MCH: 27.7 pg (ref 26.0–34.0)
MCHC: 32.8 g/dL (ref 30.0–36.0)
MCV: 84.4 fL (ref 78.0–100.0)
Platelets: 323 10*3/uL (ref 150–400)
RBC: 5.06 MIL/uL (ref 3.87–5.11)
RDW: 15.6 % — AB (ref 11.5–15.5)
WBC: 12.6 10*3/uL — ABNORMAL HIGH (ref 4.0–10.5)

## 2016-04-14 LAB — ECHOCARDIOGRAM COMPLETE
Height: 64 in
Weight: 3728 oz

## 2016-04-14 MED ORDER — TECHNETIUM TC 99M SULFUR COLLOID
2.0000 | Freq: Once | INTRAVENOUS | Status: AC | PRN
  Administered 2016-04-14: 2 via ORAL

## 2016-04-14 MED ORDER — MAGNESIUM HYDROXIDE 400 MG/5ML PO SUSP
5.0000 mL | Freq: Every day | ORAL | Status: DC | PRN
  Administered 2016-04-15: 5 mL via ORAL
  Filled 2016-04-14 (×2): qty 30

## 2016-04-14 MED ORDER — PERFLUTREN LIPID MICROSPHERE
1.0000 mL | INTRAVENOUS | Status: AC | PRN
  Filled 2016-04-14: qty 10

## 2016-04-14 NOTE — Consult Note (Signed)
[facility] Consult: 12:42 PM 04/14/2016  LOS: 0 days    Referring Provider: Dr Sunnie Nielsen  Primary Care Physician:  Elizabeth Palau, FNP Primary Gastroenterologist:  Dr. Arlyce Dice >>  [place]   Reason for Consultation:  Bloating and abdominal discomfort.    HPI: [name] is a 50 y.o. female.  PMH hypothytoidism.  Asthma.  IDDM on insulin pump, A1C 9.3 in 05/2014.  CAD, cardiac stenting prior to 2010 CABG, patent grafts 2014. HTN.  Anxiety.  Cervical spine fx 2009.  Lumbar DDD.  Asthma.   02/2007 EGD for dysphagia, CP.  Mild esophagitis.  No stricture but esophagus was empirically dilated.   03/2009 EGD with baloon dilatation of distal esophageal stricture. Esophagitis noted.   01/2009 Esophagram.  Normal  "Gastroparesis" (normal GES studies 2011, 2013) treated/improved in past with Domperidone.   ? SIBO as cause for bloating per 06/2015 GI OV.  Rxd in 06/2015 with Rifaximin for ? SIBO.  However she is unable to swallow the pill, which is large and has a "coating", so she never took the Rx, it is still sitting at home.  Reglan gave her nightmares. TTG, IgA normal in 06/2015   Home meds include Prevacid 30 mg BID.  CMET unrevealing.  WBCs 11.  Not anemic.   Troponins 0.03 >> 0.04 >> 0.03 >> 0.03.  EKG with subtile, diffuse ST changes.  Unremarkable CXR.  Reproducible chest wall pain.  Chest pain is not exertional.  No SOB.    Also relays 2 weeks of mid and upper abdominal bloating.  Sense that food is not getting past stomach.  No real regurge or vomiting.  Rare nausea.  This is all in deeping with sxs in past.  Eating less.  However weight up 10-12 # in 3 months. Occasional globus.  C/o all over tinglilng sensation post prandialy.  No bloody or black stools, less frequent BM due to not eating as much.    Nuc  med GES study completed today but not read.       Past Medical History:  Diagnosis Date  . Anxiety   . Asthma   . CAD (coronary artery disease)    4v CABG, 9/10; NL LVF, status post followup Cardiolite November 2012 no ischemia ejection fraction 65%  . Diabetes mellitus    Scheduled to go on insulin pump  . Dyslipidemia     LDL 22 mg percent on Crestor  . Ejection fraction   . Esophageal stricture   . GERD (gastroesophageal reflux disease)   . History of heart attack   . Hx of CABG    September, 2010  . Hypertension   . Hypothyroidism     Past Surgical History:  Procedure Laterality Date  . ANGIOPLASTY     with stenting   . CARDIAC SURGERY    . CESAREAN SECTION    . CORONARY ARTERY BYPASS GRAFT  01/26/2009  . EYE SURGERY    . open heart surgery   2010    Prior to Admission medications   Medication Sig Start Date End Date  Taking? Authorizing Provider  albuterol (PROVENTIL) (2.5 MG/3ML) 0.083% nebulizer solution Take 2.5 mg by nebulization every 4 (four) hours as needed. For wheezing.   Yes Historical Provider, MD  albuterol (VENTOLIN HFA) 108 (90 BASE) MCG/ACT inhaler Inhale 2 puffs into the lungs every 6 (six) hours as needed for wheezing or shortness of breath.  10/22/12  Yes Historical Provider, MD  ALPRAZolam Prudy Feeler(XANAX) 0.5 MG tablet Take 1 tablet (0.5 mg total) by mouth 2 (two) times daily. Patient taking differently: Take 0.5 mg by mouth daily.  02/17/15  Yes Louis Meckelobert D Kaplan, MD  aspirin 81 MG chewable tablet Chew 81 mg by mouth daily.   Yes Historical Provider, MD  fish oil-omega-3 fatty acids 1000 MG capsule Take 2 capsules (2 g total) by mouth 2 (two) times daily. 07/03/12  Yes Rande BruntEugene C Serpe, PA-C  Fluticasone-Salmeterol (ADVAIR DISKUS) 100-50 MCG/DOSE AEPB Inhale 1 puff into the lungs 2 (two) times daily as needed (wheezing).    Yes Historical Provider, MD  furosemide (LASIX) 40 MG tablet TAKE 1 TABLET (40 MG TOTAL) BY MOUTH DAILY. 02/04/15  Yes Luis AbedJeffrey D Katz, MD    hyoscyamine (LEVSIN SL) 0.125 MG SL tablet Place 1 tablet (0.125 mg total) under the tongue every 8 (eight) hours as needed. Patient taking differently: Place 0.125 mg under the tongue every 8 (eight) hours as needed for cramping.  07/02/15  Yes Ruffin FrederickSteven Paul Armbruster, MD  Insulin Human (INSULIN PUMP) SOLN Inject into the skin continuous. NOVOLOG INSULIN   Yes Historical Provider, MD  lansoprazole (PREVACID SOLUTAB) 30 MG disintegrating tablet 30 mg twice a day 30 minutes before breakfast and 30 minutes before supper 08/20/15  Yes Ruffin FrederickSteven Paul Armbruster, MD  levothyroxine (SYNTHROID, LEVOTHROID) 100 MCG tablet Take 100 mcg by mouth daily before breakfast.   Yes Historical Provider, MD  metoprolol tartrate (LOPRESSOR) 25 MG tablet TAKE 0.5 TABLETS (12.5 MG TOTAL) BY MOUTH 2 (TWO) TIMES DAILY. Patient taking differently: TAKE 0.5 TABLETS (12.5 MG TOTAL) BY MOUTH DAILY. 09/17/15  Yes Laqueta LindenSuresh A Koneswaran, MD  nitroGLYCERIN (NITROSTAT) 0.4 MG SL tablet Place 0.4 mg under the tongue every 5 (five) minutes as needed for chest pain.   Yes Historical Provider, MD  rosuvastatin (CRESTOR) 20 MG tablet Take 1 tablet (20 mg total) by mouth daily. 05/19/15  Yes Laqueta LindenSuresh A Koneswaran, MD  tiZANidine (ZANAFLEX) 4 MG tablet Take 0.5 tablets (2 mg total) by mouth at bedtime as needed for muscle spasms. 01/20/15  Yes Luis AbedJeffrey D Katz, MD    Scheduled Meds: . ALPRAZolam  0.5 mg Oral Daily  . aspirin  81 mg Oral Daily  . enoxaparin (LOVENOX) injection  40 mg Subcutaneous Q24H  . insulin pump   Subcutaneous Q4H  . lansoprazole  30 mg Oral Q1200  . levothyroxine  100 mcg Oral QAC breakfast  . metoprolol tartrate  12.5 mg Oral Daily  . mometasone-formoterol  2 puff Inhalation BID  . omega-3 acid ethyl esters  2 g Oral BID  . rosuvastatin  20 mg Oral Daily   Infusions: . 0.9 % NaCl with KCl 20 mEq / L 75 mL/hr at 04/13/16 1817   PRN Meds: acetaminophen, albuterol, nitroGLYCERIN, ondansetron (ZOFRAN) IV,  tiZANidine   Allergies as of 04/13/2016 - Review Complete 04/13/2016  Allergen Reaction Noted  . Metformin Anaphylaxis   . Metformin and related Shortness Of Breath 12/18/2013  . Penicillins Anaphylaxis     Family History  Problem Relation Age of Onset  . Heart disease Father   .  Diabetes Mother   . Colon cancer Neg Hx     Social History   Social History  . Marital status: Single    Spouse name: N/A  . Number of children: 1  . Years of education: N/A   Occupational History  . disabilied    Social History Main Topics  . Smoking status: Former Smoker    Packs/day: 1.00    Years: 3.00    Types: Cigarettes    Quit date: 05/22/1993  . Smokeless tobacco: Never Used     Comment:  Year Quit: 1995  . Alcohol use No  . Drug use: No  . Sexual activity: Yes   Other Topics Concern  . Not on file   Social History Narrative  . No narrative on file    REVIEW OF SYSTEMS: Constitutional:  Some fatigue: not profound.  Too cold for her to exercise but enjoys walking ENT:  No nose bleeds Pulm:  Per HPI.  No cough or DOE CV:  No palpitations, no LE edema.  GU:  No hematuria, no frequency GI:  Per HPI Heme:  No unusual bleeding or bruising.     Transfusions:  Around time of CABG 2010 Neuro:  No headaches, no peripheral tingling or numbness Derm:  No itching, no rash or sores.  Endocrine:  No sweats or chills.  No polyuria or dysuria Immunization:  Not queried.   Travel:  None beyond local counties in last few months.    PHYSICAL EXAM: Vital signs in last 24 hours: Vitals:   04/13/16 2138 04/14/16 0448  BP: 129/60 124/70  Pulse: 63 83  Resp: 18 18  Temp: 98 F (36.7 C) 98.1 F (36.7 C)   Wt Readings from Last 3 Encounters:  04/13/16 105.7 kg (233 lb)  01/25/16 106.1 kg (234 lb)  12/22/15 100.2 kg (221 lb)    General: obese, unhealthy appearing WF who is comfortable and alert Head:  No asymmetry or swelling  Eyes:  No icterus or pallor Ears:  Not HOH  Nose:   No discharge or congestion Mouth:  Poor dentition, many absent teeth.  Moist and clear oral MM.  Midline tongue.   Neck:  No mass or JVD.   Lungs:  Clear bil.  No adventitious sounds.  No dyspnea.  Heart: RRR.  No mrg.  S1, s2 present.   Abdomen:  Soft, obese, NT, no mass or HSM.  Active BS.   Rectal: deferred.     Musc/Skeltl: no joint swelling or gross deformity.   Extremities:  No CCE  Neurologic:  Oriented x 3.  No limb weakness or tremor.   Skin:  No telangectasia, sores or rash   Psych:  Pleasant, cooperative, calm.   Intake/Output from previous day: No intake/output data recorded. Intake/Output this shift: No intake/output data recorded.  LAB RESULTS:  Recent Labs  04/13/16 0218  WBC 11.0*  HGB 14.2  HCT 41.9  PLT 330   BMET Lab Results  Component Value Date   NA 136 04/13/2016   NA 140 06/16/2013   NA 139 08/21/2011   K 3.5 04/13/2016   K 4.3 06/16/2013   K 3.8 08/21/2011   CL 102 04/13/2016   CL 104 06/16/2013   CL 104 08/21/2011   CO2 24 04/13/2016   CO2 23 06/16/2013   CO2 25 08/21/2011   GLUCOSE 139 (H) 04/13/2016   GLUCOSE 198 (H) 06/16/2013   GLUCOSE 152 (H) 08/21/2011   BUN 9 04/13/2016   BUN 6 06/16/2013  BUN 3 (L) 08/21/2011   CREATININE 0.76 04/13/2016   CREATININE 0.56 06/16/2013   CREATININE 0.48 (L) 08/21/2011   CALCIUM 9.8 04/13/2016   CALCIUM 9.3 06/16/2013   CALCIUM 9.3 08/21/2011   LFT  Recent Labs  04/13/16 0452  PROT 6.9  ALBUMIN 3.7  AST 18  ALT 12*  ALKPHOS 102  BILITOT 0.5  BILIDIR <0.1*  IBILI NOT CALCULATED   PT/INR Lab Results  Component Value Date   INR 1.4 01/26/2009   INR 0.9 01/24/2009   INR 0.9 01/22/2009   Hepatitis Panel No results for input(s): HEPBSAG, HCVAB, HEPAIGM, HEPBIGM in the last 72 hours. C-Diff No components found for: CDIFF Lipase     Component Value Date/Time   LIPASE 21 03/11/2007 1832    Drugs of Abuse  No results found for: LABOPIA, COCAINSCRNUR, LABBENZ, AMPHETMU, THCU,  LABBARB   RADIOLOGY STUDIES: Dg Chest 2 View  Result Date: 04/13/2016 CLINICAL DATA:  Chest tightness, shortness of breath, and feeling of lump in throat for 4 days. Abdominal bloating. History of hypertension and diabetes. Nonsmoker. EXAM: CHEST  2 VIEW COMPARISON:  06/16/2013 FINDINGS: Postoperative changes in the mediastinum. Normal heart size and pulmonary vascularity. No focal airspace disease or consolidation in the lungs. No blunting of costophrenic angles. No pneumothorax. Mediastinal contours appear intact. Mild degenerative changes in the spine. IMPRESSION: No active cardiopulmonary disease. Electronically Signed   By: Burman Nieves M.D.   On: 04/13/2016 02:52   US Abdomen Limited Ruq  Result Date: 04/13/2016 CLINICAL DATA:  Abdominal pain. EXAM: US ABDOMEN LIMITED - RIGHT UPPER QUADRANT COMPARISON:  Gastric emptying study - 07/24/2011 FINDINGS: Gallbladder: Normal sonographic appearance of the gallbladder. No gallbladder wall thickening or pericholecystic fluid. No echogenic gallstones or gallbladder sludge. Negative sonographic Murphy's sign. Common bile duct: Obscured by bowel gas and sonographic window. Liver: There is diffuse increased coarsened echogenicity of the hepatic parenchyma suggestive of hepatic steatosis. No discrete hepatic lesions. No intrahepatic biliary duct dilatation. No ascites. IMPRESSION: 1. Marked diffuse increased coarsened echogenicity of the hepatic parenchyma suggestive hepatic steatosis. Correlation LFTs is recommended. 2. Otherwise, no explanation for patient's abdominal pain. Specifically, no evidence of cholelithiasis. Electronically Signed   By: Simonne Come M.D.   On: 04/13/2016 08:56    *  IMPRESSION:   *  Bloating and sense of gastric stasis. Hx esophagitis and esophageal strictures.  On chronic PPI.   Some pill dysphagia is chronic.   Normal previous Ba esophagram and gastric emptying studies x 2.   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  ? SIBO  *   Non-specific ST changes and minor troponin elevation.  Do not see cardiology consultation. .   *  IDDM.  Not well controlled  PLAN:     *  Pharmacy says no issue with crushing Rifaximin.  So she could take the Rifaximin (Rx from 06/2014) crushed with apple sauce.  She says she still has this at home.    *  Dr Adela Lank will see pt today.     *  Hgb A1c in AM.   Await reading of [place] GES study performed this AM.  .     [name]  04/14/2016, 12:42 PM Pager: [phone]

## 2016-04-14 NOTE — Progress Notes (Signed)
PROGRESS NOTE    [name]  NWG:[id number] DOB: [birth year] DOA: 04/13/2016 PCP: Elizabeth PalauANDERSON,TERESA, FNP    Brief Narrative: [name] is a 50 y.o. female with medical history significant for known CAD with prior CABG (per cardiologist notes has aggressive disease), s/p CABG in 2010, last cardiac catheterization 2014 demonstrated all grafts patent, diabetes mellitus on an insulin pump well-controlled last hemoglobin A1c 9, hypertension, hypothyroidism, dyslipidemia, anxiety disorder, GERD, reported gastroparesis, asthma and ongoing obesity. Patient reports that for several months after eating solids within 20-30 minutes she feels like "my food is not digesting" and has developed abdominal bloating symptoms. She has seen her primary care physician for these symptoms with recommendation for likely GI referral. Last night she ate a small amount of mashed potatoes with no immediate symptoms. She laid down and about 2 hours later she awakened with similar symptoms as described above but now associated with chest tightness. She also reports that when she has these postprandial symptoms she will occasionally get chest tightness that is accompanied by shortness of breath. No definitive exertional chest pain or shortness of breath. In the ER her initial troponin was within normal limits with second troponin slightly elevated at 0.04. Her initial EKG did show new, subtle diffuse ST segment downsloping with T-wave inversion in multiple leads (see below) She was given a dose of IV Protonix, chewable aspirin and 4 mg of IV morphine with no significant improvement in her symptoms. Her pain is reproducible upon palpation of the epigastrium.   Assessment & Plan:   Principal Problem:   Chest pain Active Problems:   Diabetes mellitus type 2, insulin dependent (HCC)   Hyperlipidemia   Anxiety disorder   Coronary artery disease involving coronary bypass graft of native heart   Asthma in adult   GERD  Gastroparesis   Hypothyroidism   HTN (hypertension)   Hx of CABG   Diabetes mellitus with complication (HCC)   Atypical chest pain   Chest pain/CAD/prior CABG 2010 -relates chest pain after eating, report abdominal distension. Report constipation  Cath 2014 demonstrated all graft patent.  -Echo -Cycle troponin, subsequently troponin negative  -Continue preadmission aspirin, beta blocker and statin -Besides known history has other multiple risk factors including poorly controlled diabetes -repeat EKG.  GI consultation.      GERD/Gastroparesis -Continue PPI -Chest pain is reproducible with palpation over epigastrium; ? Ulcer dz-check H. Pylori -NM gastric empty study  Pending  -She has previously discussed symptoms with PCP with outpatient GI referral in process -Ruled out biliary etiology: checked LFTs (WNL)  and obtained right upper quadrant ultrasound (Hepatic steatosis)     Diabetes mellitus type 2, insulin dependent  -Continue preadmission insulin pump -CBGs q hour hours -Reports most recent hemoglobin A1c 9    Asthma in adult -Continue preadmission prn albuterol nebulizer and/or albuterol MDI; continue Advair    HTN (hypertension) -Continue Lopressor -Reports of ankle edema and apparently on Lasix daily at home but will hold for now    Hyperlipidemia -Continue Crestor and omega-3 fatty acids as diet resumed    Anxiety disorder -Continue preadmission Xanax    Hypothyroidism -Continue Synthroid       DVT prophylaxis: Lovenox.  Code Status: Full code.  Family Communication: care discussed with patient  Disposition Plan: Will ask GI for consultation.   Consultants:   GI   Procedures:   none   Antimicrobials;  none   Subjective: She report abdominal pain, chest pain after she eats. Gets to her  throat feel SOB.  Report Black stool last week.  She has not had a BM in 4 days. She has not been eating for 6 days.  She feels distended.  This  pain doesn't feels like her pain from CAD   Objective: Vitals:   04/13/16 1318 04/13/16 2048 04/13/16 2138 04/14/16 0448  BP: (!) 129/58  129/60 124/70  Pulse: 66  63 83  Resp: 15  18 18   Temp: 98 F (36.7 C)  98 F (36.7 C) 98.1 F (36.7 C)  TempSrc: Oral  Oral Oral  SpO2: 98% 97% 98% 98%  Weight:      Height:       No intake or output data in the 24 hours ending 04/14/16 1327 Filed Weights   04/13/16 0209  Weight: 105.7 kg (233 lb)    Examination:  General exam: Appears calm and comfortable  Respiratory system: Clear to auscultation. Respiratory effort normal. Cardiovascular system: S1 & S2 heard, RRR. No JVD, murmurs, rubs, gallops or clicks. No pedal edema. Gastrointestinal system: Abdomen is nondistended, soft and nontender. No organomegaly or masses felt. Normal bowel sounds heard. Central nervous system: Alert and oriented. No focal neurological deficits. Extremities: Symmetric 5 x 5 power. Skin: No rashes, lesions or ulcers Psychiatry: Judgement and insight appear normal. Mood & affect appropriate.     Data Reviewed: I have personally reviewed following labs and imaging studies  CBC:  Recent Labs Lab 04/13/16 0218 04/14/16 1249  WBC 11.0* 12.6*  HGB 14.2 14.0  HCT 41.9 42.7  MCV 82.8 84.4  PLT 330 323   Basic Metabolic Panel:  Recent Labs Lab 04/13/16 0218  NA 136  K 3.5  CL 102  CO2 24  GLUCOSE 139*  BUN 9  CREATININE 0.76  CALCIUM 9.8   GFR: Estimated Creatinine Clearance: 100.9 mL/min (by C-G formula based on SCr of 0.76 mg/dL). Liver Function Tests:  Recent Labs Lab 04/13/16 0452  AST 18  ALT 12*  ALKPHOS 102  BILITOT 0.5  PROT 6.9  ALBUMIN 3.7   No results for input(s): LIPASE, AMYLASE in the last 168 hours. No results for input(s): AMMONIA in the last 168 hours. Coagulation Profile: No results for input(s): INR, PROTIME in the last 168 hours. Cardiac Enzymes:  Recent Labs Lab 04/13/16 0218 04/13/16 0452  04/13/16 1012 04/13/16 1545 04/13/16 2201  TROPONINI <0.03 0.04* <0.03 <0.03 <0.03   BNP (last 3 results) No results for input(s): PROBNP in the last 8760 hours. HbA1C: No results for input(s): HGBA1C in the last 72 hours. CBG:  Recent Labs Lab 04/13/16 1652 04/13/16 2155 04/14/16 0012 04/14/16 0447 04/14/16 1258  GLUCAP 131* 123* 106* 104* 151*   Lipid Profile: No results for input(s): CHOL, HDL, LDLCALC, TRIG, CHOLHDL, LDLDIRECT in the last 72 hours. Thyroid Function Tests: No results for input(s): TSH, T4TOTAL, FREET4, T3FREE, THYROIDAB in the last 72 hours. Anemia Panel: No results for input(s): VITAMINB12, FOLATE, FERRITIN, TIBC, IRON, RETICCTPCT in the last 72 hours. Sepsis Labs: No results for input(s): PROCALCITON, LATICACIDVEN in the last 168 hours.  No results found for this or any previous visit (from the past 240 hour(s)).       Radiology Studies: Dg Chest 2 View  Result Date: 04/13/2016 CLINICAL DATA:  Chest tightness, shortness of breath, and feeling of lump in throat for 4 days. Abdominal bloating. History of hypertension and diabetes. Nonsmoker. EXAM: CHEST  2 VIEW COMPARISON:  06/16/2013 FINDINGS: Postoperative changes in the mediastinum. Normal heart size and pulmonary  vascularity. No focal airspace disease or consolidation in the lungs. No blunting of costophrenic angles. No pneumothorax. Mediastinal contours appear intact. Mild degenerative changes in the spine. IMPRESSION: No active cardiopulmonary disease. Electronically Signed   By: Burman NievesWilliam  Stevens M.D.   On: 04/13/2016 02:52   [place] Abdomen Limited Ruq  Result Date: 04/13/2016 CLINICAL DATA:  Abdominal pain. EXAM: US ABDOMEN LIMITED - RIGHT UPPER QUADRANT COMPARISON:  Gastric emptying study - 07/24/2011 FINDINGS: Gallbladder: Normal sonographic appearance of the gallbladder. No gallbladder wall thickening or pericholecystic fluid. No echogenic gallstones or gallbladder sludge. Negative sonographic  Murphy's sign. Common bile duct: Obscured by bowel gas and sonographic window. Liver: There is diffuse increased coarsened echogenicity of the hepatic parenchyma suggestive of hepatic steatosis. No discrete hepatic lesions. No intrahepatic biliary duct dilatation. No ascites. IMPRESSION: 1. Marked diffuse increased coarsened echogenicity of the hepatic parenchyma suggestive hepatic steatosis. Correlation LFTs is recommended. 2. Otherwise, no explanation for patient's abdominal pain. Specifically, no evidence of cholelithiasis. Electronically Signed   By: Simonne ComeJohn  Watts M.D.   On: 04/13/2016 08:56        Scheduled Meds: . ALPRAZolam  0.5 mg Oral Daily  . aspirin  81 mg Oral Daily  . enoxaparin (LOVENOX) injection  40 mg Subcutaneous Q24H  . insulin pump   Subcutaneous Q4H  . lansoprazole  30 mg Oral Q1200  . levothyroxine  100 mcg Oral QAC breakfast  . metoprolol tartrate  12.5 mg Oral Daily  . mometasone-formoterol  2 puff Inhalation BID  . omega-3 acid ethyl esters  2 g Oral BID  . rosuvastatin  20 mg Oral Daily   Continuous Infusions: . 0.9 % NaCl with KCl 20 mEq / L 75 mL/hr at 04/13/16 1817     LOS: 0 days    Time spent: 35 minutes.     Alba Coryegalado, Atanacio Melnyk A, MD [facility] Pager [phone]  If 7PM-7AM, please contact night-coverage [email or web address] Password [email or web address] 04/14/2016, 1:27 PM

## 2016-04-14 NOTE — Progress Notes (Signed)
Patient sitting up on side of bed. Still feels slightly bloated, did not want milk of magnesia at this time. No other needs at this time, call light within reach.

## 2016-04-15 LAB — BASIC METABOLIC PANEL
ANION GAP: 10 (ref 5–15)
BUN: 6 mg/dL (ref 6–20)
CALCIUM: 9.5 mg/dL (ref 8.9–10.3)
CO2: 22 mmol/L (ref 22–32)
CREATININE: 0.7 mg/dL (ref 0.44–1.00)
Chloride: 104 mmol/L (ref 101–111)
GLUCOSE: 166 mg/dL — AB (ref 65–99)
Potassium: 4 mmol/L (ref 3.5–5.1)
Sodium: 136 mmol/L (ref 135–145)

## 2016-04-15 LAB — GLUCOSE, CAPILLARY
GLUCOSE-CAPILLARY: 162 mg/dL — AB (ref 65–99)
GLUCOSE-CAPILLARY: 83 mg/dL (ref 65–99)
Glucose-Capillary: 129 mg/dL — ABNORMAL HIGH (ref 65–99)
Glucose-Capillary: 132 mg/dL — ABNORMAL HIGH (ref 65–99)
Glucose-Capillary: 140 mg/dL — ABNORMAL HIGH (ref 65–99)
Glucose-Capillary: 182 mg/dL — ABNORMAL HIGH (ref 65–99)
Glucose-Capillary: 84 mg/dL (ref 65–99)

## 2016-04-15 LAB — TROPONIN I

## 2016-04-15 MED ORDER — ERYTHROMYCIN BASE 250 MG PO TABS
250.0000 mg | ORAL_TABLET | Freq: Three times a day (TID) | ORAL | Status: DC
  Administered 2016-04-15 – 2016-04-16 (×4): 250 mg via ORAL
  Filled 2016-04-15 (×6): qty 1

## 2016-04-15 NOTE — Progress Notes (Signed)
PROGRESS NOTE    Kim GUADALUPE  [name]:[id number] DOB: [birth year] DOA: 04/13/2016 PCP: Elizabeth Palau, FNP    Brief Narrative: [name] is a 50 y.o. female with medical history significant for known CAD with prior CABG (per cardiologist notes has aggressive disease), s/p CABG in 2010, last cardiac catheterization 2014 demonstrated all grafts patent, diabetes mellitus on an insulin pump well-controlled last hemoglobin A1c 9, hypertension, hypothyroidism, dyslipidemia, anxiety disorder, GERD, reported gastroparesis, asthma and ongoing obesity. Patient reports that for several months after eating solids within 20-30 minutes she feels like "my food is not digesting" and has developed abdominal bloating symptoms. She has seen her primary care physician for these symptoms with recommendation for likely GI referral. Last night she ate a small amount of mashed potatoes with no immediate symptoms. She laid down and about 2 hours later she awakened with similar symptoms as described above but now associated with chest tightness. She also reports that when she has these postprandial symptoms she will occasionally get chest tightness that is accompanied by shortness of breath. No definitive exertional chest pain or shortness of breath. In the ER her initial troponin was within normal limits with second troponin slightly elevated at 0.04. Her initial EKG did show new, subtle diffuse ST segment downsloping with T-wave inversion in multiple leads (see below) She was given a dose of IV Protonix, chewable aspirin and 4 mg of IV morphine with no significant improvement in her symptoms. Her pain is reproducible upon palpation of the epigastrium.   Assessment & Plan:   Principal Problem:   Chest pain Active Problems:   Diabetes mellitus type 2, insulin dependent (HCC)   Hyperlipidemia   Anxiety disorder   Coronary artery disease involving coronary bypass graft of native heart   Asthma in adult   GERD  Gastroparesis   Hypothyroidism   HTN (hypertension)   Hx of CABG   Diabetes mellitus with complication (HCC)   Atypical chest pain   Chest pain/CAD/prior CABG 2010 -relates chest pain after eating, report abdominal distension. Report constipation  Cath 2014 demonstrated all graft patent.  -Echo normal ef -Cycle troponin, subsequently troponin negative  -Continue preadmission aspirin, beta blocker and statin -Besides known history has other multiple risk factors including poorly controlled diabetes -repeat EKG. No significant changes, troponin negative  -gastric empty study; delay finding. Start erythromycin.      GERD/Gastroparesis -Continue PPI -Chest pain is reproducible with palpation over epigastrium; ? Ulcer dz-check H. Pylori -NM gastric empty study delay gastric study.  -She has previously discussed symptoms with PCP with outpatient GI referral in process -Ruled out biliary etiology: checked LFTs (WNL)  and obtained right upper quadrant ultrasound (Hepatic steatosis)     Diabetes mellitus type 2, insulin dependent  -Continue preadmission insulin pump -CBGs q hour hours -Reports most recent hemoglobin A1c 9    Asthma in adult -Continue preadmission prn albuterol nebulizer and/or albuterol MDI; continue Advair    HTN (hypertension) -Continue Lopressor -Reports of ankle edema and apparently on Lasix daily at home but will hold for now    Hyperlipidemia -Continue Crestor and omega-3 fatty acids as diet resumed    Anxiety disorder -Continue preadmission Xanax    Hypothyroidism -Continue Synthroid       DVT prophylaxis: Lovenox.  Code Status: Full code.  Family Communication: care discussed with patient  Disposition Plan: Will ask GI for consultation.   Consultants:   GI   Procedures:   none   Antimicrobials;  none  Subjective: She has not been able to eat today, after she ate yogurt  she felt very bad, reflux, tingling, thought she was  going to pass out.    Objective: Vitals:   04/13/16 2138 04/14/16 0448 04/14/16 2152 04/15/16 0432  BP: 129/60 124/70 134/67 (!) 129/54  Pulse: 63 83 75 70  Resp: 18 18 18 18   Temp: 98 F (36.7 C) 98.1 F (36.7 C) 97.7 F (36.5 C) 97.8 F (36.6 C)  TempSrc: Oral Oral Oral Oral  SpO2: 98% 98% 98% 94%  Weight:      Height:        Intake/Output Summary (Last 24 hours) at 04/15/16 1411 Last data filed at 04/15/16 [id number]  Gross per 24 hour  Intake              240 ml  Output                0 ml  Net              240 ml   Filed Weights   04/13/16 0209  Weight: 105.7 kg (233 lb)    Examination:  General exam: Appears calm and comfortable  Respiratory system: Clear to auscultation. Respiratory effort normal. Cardiovascular system: S1 & S2 heard, RRR. No JVD, murmurs, rubs, gallops or clicks. No pedal edema. Gastrointestinal system: Abdomen is nondistended, soft and nontender. No organomegaly or masses felt. Normal bowel sounds heard. Central nervous system: Alert and oriented. No focal neurological deficits. Extremities: Symmetric 5 x 5 power. Skin: No rashes, lesions or ulcers Psychiatry: Judgement and insight appear normal. Mood & affect appropriate.     Data Reviewed: I have personally reviewed following labs and imaging studies  CBC:  Recent Labs Lab 04/13/16 0218 04/14/16 1249  WBC 11.0* 12.6*  HGB 14.2 14.0  HCT 41.9 42.7  MCV 82.8 84.4  PLT 330 323   Basic Metabolic Panel:  Recent Labs Lab 04/13/16 0218 04/15/16 1026  NA 136 136  K 3.5 4.0  CL 102 104  CO2 24 22  GLUCOSE 139* 166*  BUN 9 6  CREATININE 0.76 0.70  CALCIUM 9.8 9.5   GFR: Estimated Creatinine Clearance: 100.9 mL/min (by C-G formula based on SCr of 0.7 mg/dL). Liver Function Tests:  Recent Labs Lab 04/13/16 0452  AST 18  ALT 12*  ALKPHOS 102  BILITOT 0.5  PROT 6.9  ALBUMIN 3.7   No results for input(s): LIPASE, AMYLASE in the last 168 hours. No results for input(s):  AMMONIA in the last 168 hours. Coagulation Profile: No results for input(s): INR, PROTIME in the last 168 hours. Cardiac Enzymes:  Recent Labs Lab 04/13/16 0452 04/13/16 1012 04/13/16 1545 04/13/16 2201 04/15/16 1026  TROPONINI 0.04* <0.03 <0.03 <0.03 <0.03   BNP (last 3 results) No results for input(s): PROBNP in the last 8760 hours. HbA1C: No results for input(s): HGBA1C in the last 72 hours. CBG:  Recent Labs Lab 04/14/16 1710 04/15/16 0018 04/15/16 0429 04/15/16 0830 04/15/16 1206  GLUCAP 157* 132* 140* 162* 129*   Lipid Profile: No results for input(s): CHOL, HDL, LDLCALC, TRIG, CHOLHDL, LDLDIRECT in the last 72 hours. Thyroid Function Tests: No results for input(s): TSH, T4TOTAL, FREET4, T3FREE, THYROIDAB in the last 72 hours. Anemia Panel: No results for input(s): VITAMINB12, FOLATE, FERRITIN, TIBC, IRON, RETICCTPCT in the last 72 hours. Sepsis Labs: No results for input(s): PROCALCITON, LATICACIDVEN in the last 168 hours.  No results found for this or any previous visit (from the  past 240 hour(s)).       Radiology Studies: Nm Gastric Emptying  Result Date: 04/14/2016 CLINICAL DATA:  Gastroparesis, diabetes mellitus, epigastric pain, bloating, reflux and nausea for 2 weeks EXAM: NUCLEAR MEDICINE GASTRIC EMPTYING SCAN TECHNIQUE: After oral ingestion of radiolabeled meal, sequential abdominal images were obtained for 4 hours. Percentage of activity emptying the stomach was calculated at 1 hour, 2 hour, 3 hour, and 4 hours. RADIOPHARMACEUTICALS:  2.2 mCi Tc-4536m sulfur colloid in standardized meal COMPARISON:  07/24/2011 FINDINGS: Expected location of the stomach in the left upper quadrant. Ingested meal empties the stomach poorly over the course of the study. <1% emptied at 1 hr ( normal >= 10%) 4% emptied at 2 hr ( normal >= 40%) 15% emptied at 3 hr ( normal >= 70%) 77% emptied at 4 hr ( normal >= 90%) IMPRESSION: Delayed gastric emptying. Electronically Signed    By: Ulyses SouthwardMark  Boles M.D.   On: 04/14/2016 14:43        Scheduled Meds: . ALPRAZolam  0.5 mg Oral Daily  . aspirin  81 mg Oral Daily  . enoxaparin (LOVENOX) injection  40 mg Subcutaneous Q24H  . erythromycin  250 mg Oral TID AC  . insulin pump   Subcutaneous Q4H  . lansoprazole  30 mg Oral Q1200  . levothyroxine  100 mcg Oral QAC breakfast  . metoprolol tartrate  12.5 mg Oral Daily  . mometasone-formoterol  2 puff Inhalation BID  . omega-3 acid ethyl esters  2 g Oral BID  . rosuvastatin  20 mg Oral Daily   Continuous Infusions:    LOS: 1 day    Time spent: 35 minutes.     Alba Coryegalado, Shamarie Call A, MD [facility] Pager [phone]  If 7PM-7AM, please contact night-coverage [email or web address] Password [email or web address] 04/15/2016, 2:11 PM

## 2016-04-16 LAB — GLUCOSE, CAPILLARY
GLUCOSE-CAPILLARY: 165 mg/dL — AB (ref 65–99)
Glucose-Capillary: 229 mg/dL — ABNORMAL HIGH (ref 65–99)
Glucose-Capillary: 281 mg/dL — ABNORMAL HIGH (ref 65–99)

## 2016-04-16 LAB — HEMOGLOBIN A1C
Hgb A1c MFr Bld: 9 % — ABNORMAL HIGH (ref 4.8–5.6)
Mean Plasma Glucose: 212 mg/dL

## 2016-04-16 MED ORDER — LANSOPRAZOLE 30 MG PO TBDP: 30.0000 mg | ORAL_TABLET | Freq: Two times a day (BID) | ORAL | 0 refills | Status: DC

## 2016-04-16 MED ORDER — ERYTHROMYCIN BASE 250 MG PO TABS: 250.0000 mg | ORAL_TABLET | Freq: Three times a day (TID) | ORAL | 0 refills | Status: DC

## 2016-04-16 NOTE — Discharge Summary (Signed)
Physician Discharge Summary  [name]:[id number] DOB: June 10, 1965 DOA: 04/13/2016  PCP: Elizabeth Palau, FNP  Admit date: 04/13/2016 Discharge date: 04/16/2016  Admitted From: Home  Disposition:  Home   Recommendations for Outpatient Follow-up:  1. Follow up with PCP in 1-2 weeks 2. Please obtain BMP/CBC in one week 3. Follow up with GI for further evaluation of reflux, and gastroparesis.   Home Health: No   Discharge Condition; Stable.  CODE STATUS:Full code.  Diet recommendation: Heart Healthy  Brief/Interim Summary: Kim J Smithis a 50 y.o.femalewith medical history significant for known CAD with prior CABG (per cardiologist notes has aggressive disease), s/pCABG in 2010, last cardiac catheterization 2014 demonstratedall grafts patent, diabetes mellitus on an insulin pump well-controlled last hemoglobin A1c 9, hypertension, hypothyroidism, dyslipidemia, anxiety disorder, GERD, reported gastroparesis, asthma and ongoing obesity. Patient reports that for several months after eating solids within 20-30 minutes she feels like "my food is not digesting" and has developed abdominal bloating symptoms. She has seen her primary care physician for these symptoms with recommendation for likely GI referral. Last night she ate a small amount of mashed potatoes with no immediate symptoms. She laid down and about 2 hours later she awakened with similar symptoms as described above but now associated with chest tightness. She also reports that when she has these postprandial symptoms she will occasionally get chest tightness that is accompanied by shortness of breath. No definitive exertional chest pain or shortness of breath. In the ER her initial troponin waswithin normal limits with second troponin slightly elevated at 0.04. Her initial EKG did show new,subtle diffuse ST segment downsloping with T-wave inversion in multiple leads (see below)She was given a dose of IV  Protonix,chewable aspirin and 4 mg of IV morphine with no significant improvement in her symptoms. Her pain is reproducible upon palpation of the epigastrium.   Assessment & Plan:   Chest pain/CAD/prior CABG 2010 -relates chest pain after eating, report abdominal distension. Report constipation  Cath 2014 demonstrated all graft patent.  -Echo normal ef -Cycle troponin, subsequently troponin negative  -Continue preadmission aspirin, beta blocker and statin -Besides known history has other multiple risk factors including poorly controlled diabetes -repeat EKG. No significant changes, troponin negative. Case discussed and reviewed by cardiology  -gastric empty study; delay finding. Started on  erythromycin.    GERD/Gastroparesis -Continue PPI change to BID.  -Chest pain is reproducible with palpation over epigastrium; ? Ulcer dz-check H. Pylori -NMgastric empty study delay gastric study.  -right upper quadrant ultrasound (Hepatic steatosis) -started on erythromycin, notice some improvement.   Diabetes mellitus type 2, insulin dependent  -Continue preadmission insulin pump -CBGs q hour hours -Reports most recent hemoglobin A1c 9  Asthma in adult -Continue preadmission prn albuterol nebulizer and/oralbuterol MDI;continue Advair  HTN (hypertension) -Continue Lopressor -Reports of ankle edema and apparently on Lasix daily at home but will hold for now  Hyperlipidemia -Continue Crestor and omega-3 fatty acids as diet resumed  Anxiety disorder -Continue preadmission Xanax  Hypothyroidism -Continue Synthroid   Discharge Diagnoses:  Principal Problem:   Chest pain Active Problems:   Diabetes mellitus type 2, insulin dependent (HCC)   Hyperlipidemia   Anxiety disorder   Coronary artery disease involving coronary bypass graft of native heart   Asthma in adult   GERD   Gastroparesis   Hypothyroidism   HTN (hypertension)   Hx of CABG    Diabetes mellitus with complication [facility])   Atypical chest pain    Discharge Instructions  Discharge Instructions  Diet - low sodium heart healthy    Complete by:  As directed    Increase activity slowly    Complete by:  As directed        Medication List    TAKE these medications   ADVAIR DISKUS 100-50 MCG/DOSE Aepb Generic drug:  Fluticasone-Salmeterol Inhale 1 puff into the lungs 2 (two) times daily as needed (wheezing).   ALPRAZolam 0.5 MG tablet Commonly known as:  XANAX Take 1 tablet (0.5 mg total) by mouth 2 (two) times daily. What changed:  when to take this   aspirin 81 MG chewable tablet Chew 81 mg by mouth daily.   erythromycin 250 MG tablet Commonly known as:  E-MYCIN Take 1 tablet (250 mg total) by mouth 4 (four) times daily -  before meals and at bedtime.   fish oil-omega-3 fatty acids 1000 MG capsule Take 2 capsules (2 g total) by mouth 2 (two) times daily.   furosemide 40 MG tablet Commonly known as:  LASIX TAKE 1 TABLET (40 MG TOTAL) BY MOUTH DAILY.   hyoscyamine 0.125 MG SL tablet Commonly known as:  LEVSIN SL Place 1 tablet (0.125 mg total) under the tongue every 8 (eight) hours as needed. What changed:  reasons to take this   insulin pump Soln Inject into the skin continuous. NOVOLOG INSULIN   lansoprazole 30 MG disintegrating tablet Commonly known as:  PREVACID SOLUTAB Take 1 tablet (30 mg total) by mouth 2 (two) times daily. 30 mg twice a day 30 minutes before breakfast and 30 minutes before supper What changed:  how much to take  how to take this  when to take this   levothyroxine 100 MCG tablet Commonly known as:  SYNTHROID, LEVOTHROID Take 100 mcg by mouth daily before breakfast.   metoprolol tartrate 25 MG tablet Commonly known as:  LOPRESSOR TAKE 0.5 TABLETS (12.5 MG TOTAL) BY MOUTH 2 (TWO) TIMES DAILY. What changed:  See the new instructions.   nitroGLYCERIN 0.4 MG SL tablet Commonly known as:  NITROSTAT Place 0.4 mg  under the tongue every 5 (five) minutes as needed for chest pain.   PROVENTIL (2.5 MG/3ML) 0.083% nebulizer solution Generic drug:  albuterol Take 2.5 mg by nebulization every 4 (four) hours as needed. For wheezing.   VENTOLIN HFA 108 (90 Base) MCG/ACT inhaler Generic drug:  albuterol Inhale 2 puffs into the lungs every 6 (six) hours as needed for wheezing or shortness of breath.   rosuvastatin 20 MG tablet Commonly known as:  CRESTOR Take 1 tablet (20 mg total) by mouth daily.   tiZANidine 4 MG tablet Commonly known as:  ZANAFLEX Take 0.5 tablets (2 mg total) by mouth at bedtime as needed for muscle spasms.       Allergies  Allergen Reactions  . Metformin Anaphylaxis  . Metformin And Related Shortness Of Breath  . Penicillins Anaphylaxis    Throat swells difficulty breathing  . Metoclopramide Other (See Comments)    Gave her bad dreams, nightmares.     Consultations:  GI    Procedures/Studies: Dg Chest 2 View  Result Date: 04/13/2016 CLINICAL DATA:  Chest tightness, shortness of breath, and feeling of lump in throat for 4 days. Abdominal bloating. History of hypertension and diabetes. Nonsmoker. EXAM: CHEST  2 VIEW COMPARISON:  06/16/2013 FINDINGS: Postoperative changes in the mediastinum. Normal heart size and pulmonary vascularity. No focal airspace disease or consolidation in the lungs. No blunting of costophrenic angles. No pneumothorax. Mediastinal contours appear intact. Mild degenerative changes in  the spine. IMPRESSION: No active cardiopulmonary disease. Electronically Signed   By: Burman NievesWilliam  Stevens M.D.   On: 04/13/2016 02:52   Nm Gastric Emptying  Result Date: 04/14/2016 CLINICAL DATA:  Gastroparesis, diabetes mellitus, epigastric pain, bloating, reflux and nausea for 2 weeks EXAM: NUCLEAR MEDICINE GASTRIC EMPTYING SCAN TECHNIQUE: After oral ingestion of radiolabeled meal, sequential abdominal images were obtained for 4 hours. Percentage of activity emptying the  stomach was calculated at 1 hour, 2 hour, 3 hour, and 4 hours. RADIOPHARMACEUTICALS:  2.2 mCi Tc-7257m sulfur colloid in standardized meal COMPARISON:  07/24/2011 FINDINGS: Expected location of the stomach in the left upper quadrant. Ingested meal empties the stomach poorly over the course of the study. <1% emptied at 1 hr ( normal >= 10%) 4% emptied at 2 hr ( normal >= 40%) 15% emptied at 3 hr ( normal >= 70%) 77% emptied at 4 hr ( normal >= 90%) IMPRESSION: Delayed gastric emptying. Electronically Signed   By: Ulyses SouthwardMark  Boles M.D.   On: 04/14/2016 14:43   [place] Abdomen Limited Ruq  Result Date: 04/13/2016 CLINICAL DATA:  Abdominal pain. EXAM: US ABDOMEN LIMITED - RIGHT UPPER QUADRANT COMPARISON:  Gastric emptying study - 07/24/2011 FINDINGS: Gallbladder: Normal sonographic appearance of the gallbladder. No gallbladder wall thickening or pericholecystic fluid. No echogenic gallstones or gallbladder sludge. Negative sonographic Murphy's sign. Common bile duct: Obscured by bowel gas and sonographic window. Liver: There is diffuse increased coarsened echogenicity of the hepatic parenchyma suggestive of hepatic steatosis. No discrete hepatic lesions. No intrahepatic biliary duct dilatation. No ascites. IMPRESSION: 1. Marked diffuse increased coarsened echogenicity of the hepatic parenchyma suggestive hepatic steatosis. Correlation LFTs is recommended. 2. Otherwise, no explanation for patient's abdominal pain. Specifically, no evidence of cholelithiasis. Electronically Signed   By: Simonne ComeJohn  Watts M.D.   On: 04/13/2016 08:56       Subjective: Notice decreased of episode of reflux.   Discharge Exam: Vitals:   04/16/16 0451 04/16/16 0946  BP: (!) 122/53   Pulse: 74 75  Resp: 16 16  Temp: 98.2 F (36.8 C)    Vitals:   04/15/16 1938 04/15/16 2024 04/16/16 0451 04/16/16 0946  BP: (!) 120/55  (!) 122/53   Pulse: 75  74 75  Resp: 18  16 16   Temp: 98 F (36.7 C)  98.2 F (36.8 C)   TempSrc: Oral  Oral   SpO2:  96% 98% 96% 97%  Weight:      Height:        General: Pt is alert, awake, not in acute distress Cardiovascular: RRR, S1/S2 +, no rubs, no gallops Respiratory: CTA bilaterally, no wheezing, no rhonchi Abdominal: Soft, NT, ND, bowel sounds + Extremities: no edema, no cyanosis    The results of significant diagnostics from this hospitalization (including imaging, microbiology, ancillary and laboratory) are listed below for reference.     Microbiology: No results found for this or any previous visit (from the past 240 hour(s)).   Labs: BNP (last 3 results) No results for input(s): BNP in the last 8760 hours. Basic Metabolic Panel:  Recent Labs Lab 04/13/16 0218 04/15/16 1026  NA 136 136  K 3.5 4.0  CL 102 104  CO2 24 22  GLUCOSE 139* 166*  BUN 9 6  CREATININE 0.76 0.70  CALCIUM 9.8 9.5   Liver Function Tests:  Recent Labs Lab 04/13/16 0452  AST 18  ALT 12*  ALKPHOS 102  BILITOT 0.5  PROT 6.9  ALBUMIN 3.7   No results for input(s):  LIPASE, AMYLASE in the last 168 hours. No results for input(s): AMMONIA in the last 168 hours. CBC:  Recent Labs Lab 04/13/16 0218 04/14/16 1249  WBC 11.0* 12.6*  HGB 14.2 14.0  HCT 41.9 42.7  MCV 82.8 84.4  PLT 330 323   Cardiac Enzymes:  Recent Labs Lab 04/13/16 0452 04/13/16 1012 04/13/16 1545 04/13/16 2201 04/15/16 1026  TROPONINI 0.04* <0.03 <0.03 <0.03 <0.03   BNP: Invalid input(s): POCBNP CBG:  Recent Labs Lab 04/15/16 1935 04/15/16 2349 04/16/16 0448 04/16/16 0809 04/16/16 1126  GLUCAP 182* 83 165* 229* 281*   D-Dimer No results for input(s): DDIMER in the last 72 hours. Hgb A1c  Recent Labs  04/15/16 0201  HGBA1C 9.0*   Lipid Profile No results for input(s): CHOL, HDL, LDLCALC, TRIG, CHOLHDL, LDLDIRECT in the last 72 hours. Thyroid function studies No results for input(s): TSH, T4TOTAL, T3FREE, THYROIDAB in the last 72 hours.  Invalid input(s): FREET3 Anemia work up No results for  input(s): VITAMINB12, FOLATE, FERRITIN, TIBC, IRON, RETICCTPCT in the last 72 hours. Urinalysis    Component Value Date/Time   COLORURINE YELLOW 04/13/2016 0319   APPEARANCEUR CLEAR 04/13/2016 0319   LABSPEC 1.009 04/13/2016 0319   PHURINE 7.5 04/13/2016 0319   GLUCOSEU NEGATIVE 04/13/2016 0319   HGBUR NEGATIVE 04/13/2016 0319   BILIRUBINUR NEGATIVE 04/13/2016 0319   KETONESUR NEGATIVE 04/13/2016 0319   PROTEINUR NEGATIVE 04/13/2016 0319   UROBILINOGEN 0.2 08/21/2011 1741   NITRITE NEGATIVE 04/13/2016 0319   LEUKOCYTESUR NEGATIVE 04/13/2016 0319   Sepsis Labs Invalid input(s): PROCALCITONIN,  WBC,  LACTICIDVEN Microbiology No results found for this or any previous visit (from the past 240 hour(s)).   Time coordinating discharge: Over 30 minutes  SIGNED:   Alba Coryegalado, Nozomi Mettler A, MD  [facility] 04/16/2016, 11:31 AM Pager   If 7PM-7AM, please contact night-coverage [email or web address] Password [email or web address]

## 2016-04-16 NOTE — Progress Notes (Signed)
Discharge instructions given to patient, questions answered.  Emily Forse, RN 

## 2016-04-17 LAB — H PYLORI, IGM, IGG, IGA AB
H Pylori IgG: <0.9 U/mL (ref 0.0–0.8)
H. Pylogi, Iga Abs: <9 units (ref 0.0–8.9)

## 2016-04-18 ENCOUNTER — Encounter: Payer: Self-pay | Admitting: Physician Assistant

## 2016-04-18 ENCOUNTER — Telehealth: Payer: Self-pay

## 2016-04-18 ENCOUNTER — Ambulatory Visit (INDEPENDENT_AMBULATORY_CARE_PROVIDER_SITE_OTHER): Payer: Medicare Other | Admitting: Physician Assistant

## 2016-04-18 ENCOUNTER — Telehealth: Payer: Self-pay | Admitting: Emergency Medicine

## 2016-04-18 VITALS — BP 126/62 | HR 70 | Ht 64.0 in | Wt 228.4 lb

## 2016-04-18 DIAGNOSIS — R1013 Epigastric pain: Secondary | ICD-10-CM | POA: Diagnosis not present

## 2016-04-18 DIAGNOSIS — K3184 Gastroparesis: Secondary | ICD-10-CM

## 2016-04-18 DIAGNOSIS — I25768 Atherosclerosis of bypass graft of coronary artery of transplanted heart with other forms of angina pectoris: Secondary | ICD-10-CM

## 2016-04-18 DIAGNOSIS — R1314 Dysphagia, pharyngoesophageal phase: Secondary | ICD-10-CM | POA: Diagnosis not present

## 2016-04-18 DIAGNOSIS — R110 Nausea: Secondary | ICD-10-CM | POA: Diagnosis not present

## 2016-04-18 DIAGNOSIS — R140 Abdominal distension (gaseous): Secondary | ICD-10-CM

## 2016-04-18 NOTE — Progress Notes (Signed)
Agree with assessment and plan. Await EGD with empiric dilation tomorrow.

## 2016-04-18 NOTE — Telephone Encounter (Signed)
Patient was seen by APP today and scheduled for EGD tomorrow, 11/29.

## 2016-04-18 NOTE — Telephone Encounter (Signed)
Patient has insulin pump and is scheduled for an egd tomorrow. Left Urgent message with Kim EarthlySandy Adcock[facility] to tit[facility] insulin [facility]. Patient is adamant that she would like procedure tomorrow but I explained to her that we do need clearance.   Will call patient back once I hear from the office.

## 2016-04-18 NOTE — Progress Notes (Signed)
Chief Complaint: Bloating, Dysphagia, Nausea, Gastroparesis  HPI:  [name] is a 50 year old Caucasian female who returns to clinic today for a complaint of bloating, dysphagia, nausea and gastroparesis.    Patient has followed in our clinic previously with Dr. Adela Lank, last in February 2017. At that time, it was discussed that she possibly had an element of gastroparesis but had not had an abnormal gastric emptying study per record review. At that time she was given Xifaxan, as antibiotics had helped her symptoms in the past and there may be an element of SIBO. It was discussed that if her symptoms were not helped, Domperidone could be used as this had been used in the past with some relief.   Patient was recently admitted to the hospital from 04/13/16 through 04/16/16 for a complaint of dysphagia as well as abdominal bloating symptoms, along with a chest tightness which occurred after eating. She had a full cardiac workup which was negative and was told to follow with her clinic. She did have a gastric emptying study during her stay which showed delayed emptying. She was started on Erythromycin 250 mg 4 times a day 30 minutes before meals and at bedtime. Per records it shows that this was helping. Patient also increased her PPI to twice a day dosing due to reflux.   Today, the patient describes that she feels "just horrible", she does become tearful during time of interview. She describes that she feels as though her throat is closing up and that food is filling up from the bottom of her stomach to the top. She constantly feels bloated and is often awakened at night with a tingling sensation in her throat related to a tightness that she experiences. She tells me this is why she went to the ED. Patient also complains of constant nausea. She tells me that lately she has been experiencing diarrhea. She explains that when given rifaximin at time of her last visit she couldn't take it because she  couldn't swallow the pills. Patient does describe some improvement of her symptoms with erythromycin in the hospital but she has been unable to fill this as of yet. She has continued her twice a day dosing of Prevacid 30 mg twice a day.   Today she also describes dysphagia symptoms noting that food tends to get stuck on its way down, this has also occurred with liquids over the past few days. The patient has been trying to abide by a soft diet, but does describe eating cereal for breakfast etc. She tells me that when food feels like it gets stuck she will then have a tingling and tightness in her throat and this makes her very anxious.   Past medical history is positive for an EGD in 2010 with Dr. Arlyce Dice with dilation of a stricture.   Patient denies fever, chills, blood in her stool, melena, weight loss or vomiting.    Past Medical History:  Diagnosis Date  . Anxiety   . Asthma   . CAD (coronary artery disease)    4v CABG, 9/10; NL LVF, status post followup Cardiolite November 2012 no ischemia ejection fraction 65%  . Diabetes mellitus    Scheduled to go on insulin pump  . Dyslipidemia     LDL 22 mg percent on Crestor  . Ejection fraction   . Esophageal stricture   . GERD (gastroesophageal reflux disease)   . History of heart attack   . Hx of CABG    September, 2010  .  Hypertension   . Hypothyroidism     Past Surgical History:  Procedure Laterality Date  . ANGIOPLASTY     with stenting   . CARDIAC SURGERY    . CESAREAN SECTION    . CORONARY ARTERY BYPASS GRAFT  01/26/2009  . EYE SURGERY    . open heart surgery   2010    Current Outpatient Prescriptions  Medication Sig Dispense Refill  . albuterol (PROVENTIL) (2.5 MG/3ML) 0.083% nebulizer solution Take 2.5 mg by nebulization every 4 (four) hours as needed. For wheezing.    Marland Kitchen albuterol (VENTOLIN HFA) 108 (90 BASE) MCG/ACT inhaler Inhale 2 puffs into the lungs every 6 (six) hours as needed for wheezing or shortness of breath.       . ALPRAZolam (XANAX) 0.5 MG tablet Take 1 tablet (0.5 mg total) by mouth 2 (two) times daily. (Patient taking differently: Take 0.5 mg by mouth daily. ) 60 tablet 0  . aspirin 81 MG chewable tablet Chew 81 mg by mouth daily.    . fish oil-omega-3 fatty acids 1000 MG capsule Take 2 capsules (2 g total) by mouth 2 (two) times daily.    . Fluticasone-Salmeterol (ADVAIR DISKUS) 100-50 MCG/DOSE AEPB Inhale 1 puff into the lungs 2 (two) times daily as needed (wheezing).     . furosemide (LASIX) 40 MG tablet TAKE 1 TABLET (40 MG TOTAL) BY MOUTH DAILY. 30 tablet 6  . hyoscyamine (LEVSIN SL) 0.125 MG SL tablet Place 1 tablet (0.125 mg total) under the tongue every 8 (eight) hours as needed. (Patient taking differently: Place 0.125 mg under the tongue every 8 (eight) hours as needed for cramping. ) 60 tablet 0  . Insulin Human (INSULIN PUMP) SOLN Inject into the skin continuous. NOVOLOG INSULIN    . lansoprazole (PREVACID SOLUTAB) 30 MG disintegrating tablet Take 1 tablet (30 mg total) by mouth 2 (two) times daily. 30 mg twice a day 30 minutes before breakfast and 30 minutes before supper 60 tablet 0  . levothyroxine (SYNTHROID, LEVOTHROID) 100 MCG tablet Take 100 mcg by mouth daily before breakfast.    . metoprolol tartrate (LOPRESSOR) 25 MG tablet TAKE 0.5 TABLETS (12.5 MG TOTAL) BY MOUTH 2 (TWO) TIMES DAILY. (Patient taking differently: TAKE 0.5 TABLETS (12.5 MG TOTAL) BY MOUTH DAILY.) 30 tablet 6  . nitroGLYCERIN (NITROSTAT) 0.4 MG SL tablet Place 0.4 mg under the tongue every 5 (five) minutes as needed for chest pain.    . rosuvastatin (CRESTOR) 20 MG tablet Take 1 tablet (20 mg total) by mouth daily. 90 tablet 3  . tiZANidine (ZANAFLEX) 4 MG tablet Take 0.5 tablets (2 mg total) by mouth at bedtime as needed for muscle spasms. 15 tablet 1  . erythromycin (E-MYCIN) 250 MG tablet Take 1 tablet (250 mg total) by mouth 4 (four) times daily -  before meals and at bedtime. (Patient not taking: Reported on  04/18/2016) 28 tablet 0   No current facility-administered medications for this visit.     Allergies as of 04/18/2016 - Review Complete 04/18/2016  Allergen Reaction Noted  . Metformin Anaphylaxis   . Metformin and related Shortness Of Breath 12/18/2013  . Penicillins Anaphylaxis   . Metoclopramide Other (See Comments) 04/14/2016    Family History  Problem Relation Age of Onset  . Heart disease Father   . Diabetes Mother   . Colon cancer Neg Hx     Social History   Social History  . Marital status: Single    Spouse name: N/A  .  Number of children: 1  . Years of education: N/A   Occupational History  . disabilied    Social History Main Topics  . Smoking status: Former Smoker    Packs/day: 1.00    Years: 3.00    Types: Cigarettes    Quit date: 05/22/1993  . Smokeless tobacco: Never Used     Comment:  Year Quit: 1995  . Alcohol use No  . Drug use: No  . Sexual activity: Yes   Other Topics Concern  . Not on file   Social History Narrative  . No narrative on file    Review of Systems:     Constitutional: No weight loss, fever, chills, weakness or fatigue Gastrointestinal: See HPI and otherwise negative Psychiatric: Positive for anxiety and depression    Physical Exam:  Vital signs: BP 126/62 (BP Location: Right Arm, Patient Position: Sitting, Cuff Size: Normal)   Pulse 70   Ht 5\' 4"  (1.626 m)   Wt 228 lb 6.4 oz (103.6 kg)   LMP 03/30/2016   SpO2 98%   BMI 39.20 kg/m   Constitutional:   Pleasant Obese Caucasian female appears to be in NAD, Well developed, Well nourished, alert and cooperative Respiratory: Respirations even and unlabored. Lungs clear to auscultation bilaterally.   No wheezes, crackles, or rhonchi.  Cardiovascular: Normal S1, S2. No MRG. Regular rate and rhythm. No peripheral edema, cyanosis or pallor.  Gastrointestinal:  Soft, nondistended, nontender. No rebound or guarding. Normal bowel sounds. No appreciable masses or  hepatomegaly. Psychiatric: Anxious-appearing Demonstrates good judgement and reason without abnormal affect or behaviors.  RECENT LABS AND IMAGING: CBC    Component Value Date/Time   WBC 12.6 (H) 04/14/2016 1249   RBC 5.06 04/14/2016 1249   HGB 14.0 04/14/2016 1249   HCT 42.7 04/14/2016 1249   PLT 323 04/14/2016 1249   MCV 84.4 04/14/2016 1249   MCH 27.7 04/14/2016 1249   MCHC 32.8 04/14/2016 1249   RDW 15.6 (H) 04/14/2016 1249   LYMPHSABS 2.8 06/16/2013 1326   MONOABS 0.5 06/16/2013 1326   EOSABS 0.2 06/16/2013 1326   BASOSABS 0.0 06/16/2013 1326    CMP     Component Value Date/Time   NA 136 04/15/2016 1026   K 4.0 04/15/2016 1026   CL 104 04/15/2016 1026   CO2 22 04/15/2016 1026   GLUCOSE 166 (H) 04/15/2016 1026   BUN 6 04/15/2016 1026   CREATININE 0.70 04/15/2016 1026   CALCIUM 9.5 04/15/2016 1026   PROT 6.9 04/13/2016 0452   ALBUMIN 3.7 04/13/2016 0452   AST 18 04/13/2016 0452   ALT 12 (L) 04/13/2016 0452   ALKPHOS 102 04/13/2016 0452   BILITOT 0.5 04/13/2016 0452   GFRNONAA >60 04/15/2016 1026   GFRAA >60 04/15/2016 1026   US ABDOMEN LIMITED - RIGHT UPPER QUADRANT 04/13/16  COMPARISON:  Gastric emptying study - 07/24/2011  FINDINGS: Gallbladder:  Normal sonographic appearance of the gallbladder. No gallbladder wall thickening or pericholecystic fluid. No echogenic gallstones or gallbladder sludge. Negative sonographic Murphy's sign.  Common bile duct:  Obscured by bowel gas and sonographic window.  Liver:  There is diffuse increased coarsened echogenicity of the hepatic parenchyma suggestive of hepatic steatosis. No discrete hepatic lesions. No intrahepatic biliary duct dilatation. No ascites.  IMPRESSION: 1. Marked diffuse increased coarsened echogenicity of the hepatic parenchyma suggestive hepatic steatosis. Correlation LFTs is recommended. 2. Otherwise, no explanation for patient's abdominal pain. Specifically, no evidence of  cholelithiasis.   Electronically Signed   By: Jonny RuizJohn  Watts M.D.   On: 04/13/2016 08:56  NUCLEAR MEDICINE GASTRIC EMPTYING SCAN 04/14/16  TECHNIQUE: After oral ingestion of radiolabeled meal, sequential abdominal images were obtained for 4 hours. Percentage of activity emptying the stomach was calculated at 1 hour, 2 hour, 3 hour, and 4 hours.  RADIOPHARMACEUTICALS:  2.2 mCi Tc-6941m sulfur colloid in standardized meal  COMPARISON:  07/24/2011  FINDINGS: Expected location of the stomach in the left upper quadrant.  Ingested meal empties the stomach poorly over the course of the study.  <1% emptied at 1 hr ( normal >= 10%)  4% emptied at 2 hr ( normal >= 40%)  15% emptied at 3 hr ( normal >= 70%)  77% emptied at 4 hr ( normal >= 90%)  IMPRESSION: Delayed gastric emptying.   Electronically Signed   By: Ulyses SouthwardMark  Boles M.D.   On: 04/14/2016 14:43  Assessment: 1. Gastroparesis: Previous diagnosis, reconfirmed on gastric emptying study in the hospital on 04/14/2016, patient had some improvement with erythromycin in the hospital, unable to pick up this prescription since discharge, recommend she do so now, likely related to uncontrolled diabetes 2. Bloating: With above 3. Epigastric pain: With above 4. Dysphagia: To both solids and now liquids, describes a throat tightness which sometimes causes tingling after she tries to eat solid foods over the past couple of weeks, history of esophageal stricture, likely this is the same 5. Nausea: Likely related to gastroparesis  Plan: 1. Continue Erythromycin 250 mg 4 times a day, 30-60 minutes before meals and at bedtime. Discussed this medication in great detail with the patient, often times we continues this for 4-6 weeks at a time and then stop it and restart it if it helped. 2. Discussed that Domperidone may need to be prescribed in the future if patient does not do well on above medication. She can discuss this with Dr.  Adela LankArmbruster. 3. Continue Prevacid 30 mg twice a day, 30-60 minutes before eating. 4. Recommend the patient maintain a dysphagia 1 diet until time of EGD and dilation. Provided a handout. 5. Recommend the patient added Zantac 150 milligrams at night before laying down as she tends to wake up in the middle of the night with a feeling of esophageal tightening.  6. Recommend the patient proceed with an EGD. Discussed risks, benefits, limitations and alternatives of the patient agrees to proceed. She is on an insulin pump and will need to discuss dosing with her endocrinologist. Message was sent as patient would like to have an EGD tomorrow morning with [facility]. Patient to follow in clinic with Dr. Adela LankArmbruster per his recommendations after time of EGD  Hyacinth MeekerJennifer Taya Ashbaugh, PA-C [facility] 04/18/2016, 4:02 PM  Cc: Elizabeth PalauAnderson, Teresa, FNP

## 2016-04-18 NOTE — Patient Instructions (Signed)
We have sent the following medications to your pharmacy for you to pick up at your convenience: Zantac 150 mg qhs Erthyromycin 250 mg before meals and at bedtime.   We have given you a handout on dyshpagia diet and gastroparesis.  Continue Prevacid 30 mg twice a day  You have been scheduled for an endoscopy. Please follow written instructions given to you at your visit today. If you use inhalers (even only as needed), please bring them with you on the day of your procedure. Your physician has requested that you go to www.startemmi.com and enter the access code given to you at your visit today. This web site gives a general overview about your procedure. However, you should still follow specific instructions given to you by our office regarding your preparation for the procedure.

## 2016-04-18 NOTE — Telephone Encounter (Signed)
-----   Message from Ruffin FrederickSteven Paul Armbruster, MD sent at 04/18/2016 12:46 PM EST ----- LES is fine. Thanks  ----- Message ----- From: Leverne HumblesJulia M Fournier, RN Sent: 04/18/2016  10:46 AM To: Ruffin FrederickSteven Paul Armbruster, MD  [facility]? ----- Message ----- From: Ruffin FrederickSteven Paul Armbruster, MD Sent: 04/14/2016   2:51 PM To: Leverne HumblesJulia M Fournier, RN  Hi Raynelle FanningJulie, This patient of mine was admitted today. I think she will go home soon. She needs a follow up EGD with dilation with me in the next few weeks if you can help coordinate. Thanks

## 2016-04-19 ENCOUNTER — Encounter: Payer: Self-pay | Admitting: Gastroenterology

## 2016-04-19 ENCOUNTER — Telehealth: Payer: Self-pay | Admitting: Gastroenterology

## 2016-04-19 ENCOUNTER — Ambulatory Visit (AMBULATORY_SURGERY_CENTER): Payer: Medicare Other | Admitting: Gastroenterology

## 2016-04-19 VITALS — BP 141/66 | HR 67 | Temp 96.2°F | Resp 16 | Ht 64.0 in | Wt 228.0 lb

## 2016-04-19 DIAGNOSIS — R1310 Dysphagia, unspecified: Secondary | ICD-10-CM | POA: Diagnosis not present

## 2016-04-19 DIAGNOSIS — R1084 Generalized abdominal pain: Secondary | ICD-10-CM

## 2016-04-19 LAB — GLUCOSE, CAPILLARY
GLUCOSE-CAPILLARY: 101 mg/dL — AB (ref 65–99)
GLUCOSE-CAPILLARY: 106 mg/dL — AB (ref 65–99)
GLUCOSE-CAPILLARY: 117 mg/dL — AB (ref 65–99)

## 2016-04-19 MED ORDER — SODIUM CHLORIDE 0.9 % IV SOLN: 500.0000 mL | INTRAVENOUS | Status: DC

## 2016-04-19 NOTE — Telephone Encounter (Signed)
Spoke with nurse from [facility] yesterday about how to titrate the insulin pump for procedure in great detail. She also spoke with the patient and instructed her as well. Her notes should be under care everywhere.

## 2016-04-19 NOTE — Progress Notes (Signed)
FSBS 101

## 2016-04-19 NOTE — Telephone Encounter (Signed)
Returned phone call to patient, advised it is okay to take Tylenol and that she may also gargle with some warm salt water. Advised her to call back if pain becomes worse.

## 2016-04-19 NOTE — Op Note (Signed)
[facility] Patient Name: Kim KinsmanVirginia [name] Procedure Date: 04/19/2016 9:54 AM MRN: [id number] Endoscopist: Viviann SpareSteven P. Yvana Samonte MD, MD Age: [age] Referring MD:  Date of Birth: [birth year] Gender: Female Account #: [id number] Procedure:                Upper GI endoscopy Indications:              Epigastric abdominal pain, Dysphagia, severe                            abdominal bloating, delayed gastric emptying noted                            on gastric emptying study Medicines:                Monitored Anesthesia Care Procedure:                Pre-Anesthesia Assessment:                           - Prior to the procedure, a History and Physical                            was performed, and patient medications and                            allergies were reviewed. The patient's tolerance of                            previous anesthesia was also reviewed. The risks                            and benefits of the procedure and the sedation                            options and risks were discussed with the patient.                            All questions were answered, and informed consent                            was obtained. Prior Anticoagulants: The patient has                            taken aspirin, last dose was 1 day prior to                            procedure. ASA Grade Assessment: III - A patient                            with severe systemic disease. After reviewing the                            risks and benefits, the patient was deemed in  satisfactory condition to undergo the procedure.                           After obtaining informed consent, the endoscope was                            passed under direct vision. Throughout the                            procedure, the patient's blood pressure, pulse, and                            oxygen saturations were monitored continuously. The                            Model GIF-[id number]  [id number]) scope was introduced                            through the mouth, and advanced to the second part                            of duodenum. The upper GI endoscopy was                            accomplished without difficulty. The patient                            tolerated the procedure well. Scope In: Scope Out: Findings:                 Esophagogastric landmarks were identified: the                            Z-line was found at 37 cm, the gastroesophageal                            junction was found at 37 cm and the upper extent of                            the gastric folds was found at 37 cm from the                            incisors.                           The exam of the esophagus was otherwise normal                            without obvious stenosis or inflammatory changes.                           A guidewire was placed and the scope was withdrawn.                            Dilation  was performed in the entire esophagus with                            a Savary dilator with very mild resistance at 17 mm                            and 18 mm. Re-inspection with upper endoscopy after                            empiric dilation showed a mucosal wrent around                            17-18cm from the incisors, suspect there was a                            subtle stenosis in this area causing the patient's                            symptoms.                           The entire examined stomach was normal. Biopsies                            were taken with a cold forceps for Helicobacter                            pylori testing.                           The duodenal bulb and second portion of the                            duodenum were normal. Biopsies for histology were                            taken with a cold forceps for evaluation of celiac                            disease. Complications:            No immediate complications. Estimated blood  loss:                            Minimal. Estimated Blood Loss:     Estimated blood loss was minimal. Impression:               - Esophagogastric landmarks identified.                           - Normal esophagus otherwise - empirically dilated                            to 18mm Savory dilation with mucosal wrent noted in  the upper esophagus, suspect the patient had a                            subtle stenosis in this area causing her symptoms                           - Normal stomach. Biopsied.                           - Normal duodenal bulb and second portion of the                            duodenum. Biopsied. Recommendation:           - Patient has a contact number available for                            emergencies. The signs and symptoms of potential                            delayed complications were discussed with the                            patient. Return to normal activities tomorrow.                            Written discharge instructions were provided to the                            patient.                           - Resume previous diet.                           - Continue present medications.                           - No ibuprofen, naproxen, or other non-steroidal                            anti-inflammatory drugs for 2 weeks.                           - Await pathology results.                           - Resume PPI and erythromycin                           - Consideration for domperidone pending the                            patient's course for treatment of gastroparesis                           - Rifaximin 550mg  TID for  2 weeks trial if patient                            can tolerate pills (patient should already have                            prescription for this)                           - Repeat upper endoscopy PRN for retreatment. Viviann SpareSteven P. Jayton Popelka MD, MD 04/19/2016 10:18:14 AM This report has been signed  electronically.

## 2016-04-19 NOTE — Progress Notes (Signed)
Called to room to assist during endoscopic procedure.  Patient ID and intended procedure confirmed with present staff. Received instructions for my participation in the procedure from the performing physician.  

## 2016-04-19 NOTE — Patient Instructions (Signed)
Impression/recommendations:  Dilation diet (handout given)  YOU HAD AN ENDOSCOPIC PROCEDURE TODAY AT THE [facility]:   Refer to the procedure report that was given to you for any specific questions about what was found during the examination.  If the procedure report does not answer your questions, please call your gastroenterologist to clarify.  If you requested that your care partner not be given the details of your procedure findings, then the procedure report has been included in a sealed envelope for you to review at your convenience later.  YOU SHOULD EXPECT: Some feelings of bloating in the abdomen. Passage of more gas than usual.  Walking can help get rid of the air that was put into your GI tract during the procedure and reduce the bloating. If you had a lower endoscopy (such as a colonoscopy or flexible sigmoidoscopy) you may notice spotting of blood in your stool or on the toilet paper. If you underwent a bowel prep for your procedure, you may not have a normal bowel movement for a few days.  Please Note:  You might notice some irritation and congestion in your nose or some drainage.  This is from the oxygen used during your procedure.  There is no need for concern and it should clear up in a day or so.  SYMPTOMS TO REPORT IMMEDIATELY:   Following upper endoscopy (EGD)  Vomiting of blood or coffee ground material  New chest pain or pain under the shoulder blades  Painful or persistently difficult swallowing  New shortness of breath  Fever of 100F or higher  Black, tarry-looking stools  For urgent or emergent issues, a gastroenterologist can be reached at any hour by calling [phone].   DIET:  We do recommend a small meal at first, but then you may proceed to your regular diet.  Drink plenty of fluids but you should avoid alcoholic beverages for 24 hours.  ACTIVITY:  You should plan to take it easy for the rest of today and you should NOT DRIVE or use heavy  machinery until tomorrow (because of the sedation medicines used during the test).    FOLLOW UP: Our staff will call the number listed on your records the next business day following your procedure to check on you and address any questions or concerns that you may have regarding the information given to you following your procedure. If we do not reach you, we will leave a message.  However, if you are feeling well and you are not experiencing any problems, there is no need to return our call.  We will assume that you have returned to your regular daily activities without incident.  If any biopsies were taken you will be contacted by phone or by letter within the next 1-3 weeks.  Please call us at [phone] if you have not heard about the biopsies in 3 weeks.    SIGNATURES/CONFIDENTIALITY: You and/or your care partner have signed paperwork which will be entered into your electronic medical record.  These signatures attest to the fact that that the information above on your After Visit Summary has been reviewed and is understood.  Full responsibility of the confidentiality of this discharge information lies with you and/or your care-partner.

## 2016-04-20 ENCOUNTER — Telehealth: Payer: Self-pay

## 2016-04-20 NOTE — Telephone Encounter (Signed)
  Follow up Call-  Call back number 04/19/2016  Post procedure Call Back phone  # [phone]  Permission to leave phone message Yes  Some recent data might be hidden     Patient questions:  Do you have a fever, pain , or abdominal swelling? No. Pain Score  0 *  Have you tolerated food without any problems? Yes.    Have you been able to return to your normal activities? Yes.    Do you have any questions about your discharge instructions: Diet   No. Medications  No. Follow up visit  No.  Do you have questions or concerns about your Care? No.  Actions: * If pain score is 4 or above: No action needed, pain <4.

## 2016-04-25 ENCOUNTER — Encounter: Payer: Self-pay | Admitting: Gastroenterology

## 2016-04-28 ENCOUNTER — Telehealth: Payer: Self-pay | Admitting: Gastroenterology

## 2016-04-28 NOTE — Telephone Encounter (Signed)
I don't think Zegerid would provide any additional benefit over her prevacid currently. Her esophagus was normal on EGD. I am awaiting biopsy results from her endoscopy, she will be contacted when they return. She can try some gaviscon PRN, she can get this over the counter, for breakthrough reflux symptoms in the interim. Thanks

## 2016-04-28 NOTE — Telephone Encounter (Signed)
Is Zegrid ok? If so, need instructions please.

## 2016-05-01 ENCOUNTER — Other Ambulatory Visit: Payer: Self-pay

## 2016-05-01 ENCOUNTER — Telehealth: Payer: Self-pay | Admitting: Gastroenterology

## 2016-05-01 MED ORDER — ERYTHROMYCIN BASE 250 MG PO TABS: 250.0000 mg | ORAL_TABLET | Freq: Three times a day (TID) | ORAL | 0 refills | Status: DC

## 2016-05-01 NOTE — Telephone Encounter (Signed)
That's fine, we can give her another 2 weeks of erythromycin if it has been helping. Please order as previously prescribed. Thanks

## 2016-05-01 NOTE — Telephone Encounter (Signed)
I'm glad the erythromycin helped. We can give her another few weeks of it. Thanks for clarifying for her regarding her other questions. I would like to see her back in clinic if you can coordinate a follow up visit for her. Thanks much

## 2016-05-01 NOTE — Telephone Encounter (Signed)
Patient states that her prescription for erythromycin 250 mg is completed. She wants to know if she should continue taking this as it is "really helping". Please advise. Also she asked about Zegerid again, went over with her your recommendations to continue prevacid and may take gaviscon PRN. She did not get the gaviscon but will pick this up and try.

## 2016-05-01 NOTE — Telephone Encounter (Signed)
Pt is fine with trying Gaviscon until bx results. Pt states that she also request Erythromycin for the gastroparesis. She says that her stomach is much much better but wants another course to feel more secure.

## 2016-05-01 NOTE — Telephone Encounter (Signed)
Patient advised that Dr. Adela LankArmbruster will give her another 3 weeks of erythromycin, Rx sent to pharmacy. She is scheduled for a follow up visit on 06/20/16.

## 2016-05-02 ENCOUNTER — Other Ambulatory Visit: Payer: Self-pay

## 2016-05-02 MED ORDER — ERYTHROMYCIN BASE 250 MG PO TABS: 250.0000 mg | ORAL_TABLET | Freq: Three times a day (TID) | ORAL | 0 refills | Status: AC

## 2016-05-02 NOTE — Telephone Encounter (Signed)
Erythromycin faxed to pharmacy with same instructions.

## 2016-05-05 ENCOUNTER — Telehealth: Payer: Self-pay | Admitting: Gastroenterology

## 2016-05-05 NOTE — Telephone Encounter (Signed)
Kim FanningJulie can you please relay path results to this patient from recent endoscopy. This was reportedly resulted in Epic but it did not appear in my inbox and not sure why the system did not notify me it was resulted  - EGD done for dysphagia and dyspepsia. She had a subtle stenosis in the proximal esophagus dilated to 18mm with good result, I hope she had benefit of her dysphagia with this intervention - biopsies of the stomach were normal, and normal biopsies of the small bowel - I suspect her symptoms are due to gastroparesis. On erythromycin and doing better. I had also offered her a trial of rifaximin for severe bloating, she should have the script for this if she wishes to fill it. Otherwise I will await her follow up clinic appointment with me in January. We may consider domperidone pending her course.   Thanks

## 2016-05-05 NOTE — Telephone Encounter (Signed)
Patient advised of results. She states doing much better after the dilation, still has some a.m. feeling of fullness in her throat, but is relieved after taking her prevacid. Patient will follow up on 1/30 in office.

## 2016-05-18 ENCOUNTER — Telehealth: Payer: Self-pay | Admitting: Gastroenterology

## 2016-05-18 ENCOUNTER — Other Ambulatory Visit: Payer: Self-pay

## 2016-05-18 MED ORDER — LANSOPRAZOLE 30 MG PO TBDP: 30.0000 mg | ORAL_TABLET | Freq: Two times a day (BID) | ORAL | 0 refills | Status: DC

## 2016-05-19 ENCOUNTER — Other Ambulatory Visit: Payer: Self-pay | Admitting: Gastroenterology

## 2016-05-19 NOTE — Telephone Encounter (Signed)
I contacted [facility] and gave verbal order for prevacid since pharmacy states they never received rx for prevacid.

## 2016-06-05 ENCOUNTER — Ambulatory Visit (INDEPENDENT_AMBULATORY_CARE_PROVIDER_SITE_OTHER): Payer: Medicare Other | Admitting: Cardiovascular Disease

## 2016-06-05 ENCOUNTER — Encounter: Payer: Self-pay | Admitting: Cardiovascular Disease

## 2016-06-05 ENCOUNTER — Telehealth: Payer: Self-pay | Admitting: Gastroenterology

## 2016-06-05 VITALS — BP 124/74 | HR 66 | Ht 64.0 in | Wt 232.0 lb

## 2016-06-05 DIAGNOSIS — I209 Angina pectoris, unspecified: Secondary | ICD-10-CM

## 2016-06-05 DIAGNOSIS — K3184 Gastroparesis: Secondary | ICD-10-CM

## 2016-06-05 DIAGNOSIS — R062 Wheezing: Secondary | ICD-10-CM | POA: Diagnosis not present

## 2016-06-05 DIAGNOSIS — I10 Essential (primary) hypertension: Secondary | ICD-10-CM | POA: Diagnosis not present

## 2016-06-05 DIAGNOSIS — E782 Mixed hyperlipidemia: Secondary | ICD-10-CM

## 2016-06-05 DIAGNOSIS — I25768 Atherosclerosis of bypass graft of coronary artery of transplanted heart with other forms of angina pectoris: Secondary | ICD-10-CM

## 2016-06-05 NOTE — Patient Instructions (Signed)
Your physician wants you to follow-up in: 1 YEAR WITH DR KONESWARAN You will receive a reminder letter in the mail two months in advance. If you don't receive a letter, please call our office to schedule the follow-up appointment.  Your physician recommends that you continue on your current medications as directed. Please refer to the Current Medication list given to you today.  Thank you for choosing [facility]!!    

## 2016-06-05 NOTE — Telephone Encounter (Signed)
Patient called and for last 2 weeks she has had an increase of nausea, bloating, full feeling in her stomach, feels like the erythromycin has stopped working. She does have a follow up appointment on 1/30 but is questioning getting the Rx for Domperidone. Please advise.

## 2016-06-05 NOTE — Telephone Encounter (Signed)
If she has been taking erythromycin every day, she can build up resistance to it. I would recommend she stop taking it for 4-5 days and then resume and she if it helps her more. Regarding domperidone I think it is reasonable to consider it, she will need to have an EKG done within the past few months to ensure Qtc stable on it and I need to discuss this with her in person prior to giving it to her. Thanks

## 2016-06-05 NOTE — Telephone Encounter (Signed)
Thanks Raynelle FanningJulie. I appreciate your help. These are chronic symptoms for this patient, hopefully we can get her feeling better with recommendations as outlined. I will see her again in clinic

## 2016-06-05 NOTE — Progress Notes (Signed)
SUBJECTIVE: The patient presents for follow-up of coronary artery disease and history of CABG. She underwent coronary angiography in 2014 and all of her bypass grafts were patent at that time. She also has a history of left sided chest wall muscle spasm which responds to muscle relaxants.  Prior to CABG she had chest tightness which she denies at present.  She was hospitalized for chest pain in November 2017 with troponins being unremarkable. PPIs were increased to twice a day dosing. Pain was reproducible upon palpation of the epigastrium.  Echocardiogram 04/14/16: Normal left ventricular systolic function and regional wall motion, LVEF 55-60%.  Her primary issues are related to gastroparesis and acid reflux disease.  Her gastroenterologist plans to start her on domperidone but wants to make sure QTc interval is stable.  ECG performed in the office today demonstrates normal sinus rhythm with no ischemic ST segment or T-wave abnormalities, nor any arrhythmias. QTc 439 ms. Was 435 ms on 04/15/16.   Review of Systems: As per "subjective", otherwise negative.  Allergies  Allergen Reactions  . Metformin Anaphylaxis  . Metformin And Related Shortness Of Breath  . Penicillins Anaphylaxis    Throat swells difficulty breathing  . Metoclopramide Other (See Comments)    Gave her bad dreams, nightmares.     Current Outpatient Prescriptions  Medication Sig Dispense Refill  . albuterol (PROVENTIL) (2.5 MG/3ML) 0.083% nebulizer solution Take 2.5 mg by nebulization every 4 (four) hours as needed. For wheezing.    Marland Kitchen albuterol (VENTOLIN HFA) 108 (90 BASE) MCG/ACT inhaler Inhale 2 puffs into the lungs every 6 (six) hours as needed for wheezing or shortness of breath.     . ALPRAZolam (XANAX) 0.5 MG tablet Take 1 tablet (0.5 mg total) by mouth 2 (two) times daily. (Patient taking differently: Take 0.5 mg by mouth daily. ) 60 tablet 0  . aspirin 81 MG chewable tablet Chew 81 mg by mouth daily.     Marland Kitchen erythromycin (ERY-TAB) 250 MG EC tablet Take 250 mg by mouth 4 (four) times daily.    . fish oil-omega-3 fatty acids 1000 MG capsule Take 2 capsules (2 g total) by mouth 2 (two) times daily.    . Fluticasone-Salmeterol (ADVAIR DISKUS) 100-50 MCG/DOSE AEPB Inhale 1 puff into the lungs 2 (two) times daily as needed (wheezing).     . furosemide (LASIX) 40 MG tablet TAKE 1 TABLET (40 MG TOTAL) BY MOUTH DAILY. 30 tablet 6  . hyoscyamine (LEVSIN SL) 0.125 MG SL tablet Place 1 tablet (0.125 mg total) under the tongue every 8 (eight) hours as needed. (Patient taking differently: Place 0.125 mg under the tongue every 8 (eight) hours as needed for cramping. ) 60 tablet 0  . Insulin Human (INSULIN PUMP) SOLN Inject into the skin continuous. NOVOLOG INSULIN    . lansoprazole (PREVACID SOLUTAB) 30 MG disintegrating tablet Take 1 tablet (30 mg total) by mouth 2 (two) times daily. 30 mg twice a day 30 minutes before breakfast and 30 minutes before supper 60 tablet 0  . levothyroxine (SYNTHROID, LEVOTHROID) 100 MCG tablet Take 100 mcg by mouth daily before breakfast.    . metoprolol tartrate (LOPRESSOR) 25 MG tablet TAKE 0.5 TABLETS (12.5 MG TOTAL) BY MOUTH 2 (TWO) TIMES DAILY. (Patient taking differently: TAKE 0.5 TABLETS (12.5 MG TOTAL) BY MOUTH DAILY.) 30 tablet 6  . nitroGLYCERIN (NITROSTAT) 0.4 MG SL tablet Place 0.4 mg under the tongue every 5 (five) minutes as needed for chest pain.    Marland Kitchen  rosuvastatin (CRESTOR) 20 MG tablet Take 1 tablet (20 mg total) by mouth daily. 90 tablet 3  . tiZANidine (ZANAFLEX) 4 MG tablet Take 0.5 tablets (2 mg total) by mouth at bedtime as needed for muscle spasms. 15 tablet 1   Current Facility-Administered Medications  Medication Dose Route Frequency Provider Last Rate Last Dose  . 0.9 %  sodium chloride infusion  500 mL Intravenous Continuous Ruffin FrederickSteven Paul Armbruster, MD        Past Medical History:  Diagnosis Date  . Anxiety   . Asthma   . CAD (coronary artery disease)     4v CABG, 9/10; NL LVF, status post followup Cardiolite November 2012 no ischemia ejection fraction 65%  . Diabetes mellitus    Scheduled to go on insulin pump  . Dyslipidemia     LDL 22 mg percent on Crestor  . Ejection fraction   . Esophageal stricture   . GERD (gastroesophageal reflux disease)   . History of heart attack   . Hx of CABG    September, 2010  . Hypertension   . Hypothyroidism     Past Surgical History:  Procedure Laterality Date  . ANGIOPLASTY     with stenting   . CARDIAC SURGERY    . CESAREAN SECTION    . CORONARY ARTERY BYPASS GRAFT  01/26/2009  . EYE SURGERY    . open heart surgery   2010    Social History   Social History  . Marital status: Single    Spouse name: N/A  . Number of children: 1  . Years of education: N/A   Occupational History  . disabilied    Social History Main Topics  . Smoking status: Former Smoker    Packs/day: 1.00    Years: 3.00    Types: Cigarettes    Start date: 03/14/1991    Quit date: 05/22/1993  . Smokeless tobacco: Never Used     Comment:  Year Quit: 1995  . Alcohol use No  . Drug use: No  . Sexual activity: Yes   Other Topics Concern  . Not on file   Social History Narrative  . No narrative on file     Vitals:   06/05/16 1454  BP: 124/74  Pulse: 66  Weight: 232 lb (105.2 kg)  Height: 5\' 4"  (1.626 m)    PHYSICAL EXAM General: NAD HEENT: Poor dentition. Neck: No JVD, no thyromegaly. Lungs: Diffuse wheezes b/l. CV: Nondisplaced PMI.  Regular rate and rhythm, normal S1/S2, no S3/S4, no murmur. No pretibial or periankle edema.     Abdomen: Soft, obese. Neurologic: Alert and oriented.  Psych: Normal affect. Skin: Normal. Musculoskeletal: No gross deformities.    ECG: Most recent ECG reviewed.      ASSESSMENT AND PLAN: 1. CAD with CABG: CCS class II angina. Continue ASA, metoprolol, and rosuvastatin. Unable to swallow Ranexa tablets.  2. Essential HTN: Controlled. No changes.  3.  Hyperlipidemia: Continue statin therapy.  4. Gastroparesis: QTc interval is normal, 439 ms. Ok to start domperidone.  5. Wheezing: Encouraged to use Advair Diskus.  Dispo: f/u 1 yr  Prentice DockerSuresh Kurstyn Larios, M.D., F.A.C.C.

## 2016-06-05 NOTE — Addendum Note (Signed)
Addended by: Burman NievesASHWORTH, Ithzel Fedorchak T on: 06/05/2016 03:41 PM   Modules accepted: Orders

## 2016-06-05 NOTE — Telephone Encounter (Signed)
Spoke to patient, advised to stop the erythromycin for 5 days then resume. She states she has a follow up appointment at her cardiologist today. Asked that if she does not have a recent EKG, perhaps she can get one done today and have them fax it over to our office. Patient also stated that she is very anxious and worries about everything, worried about full feeling in her throat since this morning, coughed up clear/white phlegm, is able to eat soft, more liquid type of food. Feels very jittery.  Asked that she talk to cardiologist about this at today's appointment. Patient states that her blood sugars are wnl. Advised her to try yogurt or cottage cheese to get some protein, other than the chicken broth and biscuit gravy she's had today. She will stop antibiotic and resume in 5 days, she will also keep her appointment on 1/30.

## 2016-06-15 ENCOUNTER — Other Ambulatory Visit: Payer: Self-pay | Admitting: Gastroenterology

## 2016-06-20 ENCOUNTER — Encounter: Payer: Self-pay | Admitting: Gastroenterology

## 2016-06-20 ENCOUNTER — Ambulatory Visit (INDEPENDENT_AMBULATORY_CARE_PROVIDER_SITE_OTHER): Payer: Medicare Other | Admitting: Gastroenterology

## 2016-06-20 VITALS — BP 110/64 | HR 68 | Ht 62.4 in | Wt 229.2 lb

## 2016-06-20 DIAGNOSIS — R1310 Dysphagia, unspecified: Secondary | ICD-10-CM | POA: Diagnosis not present

## 2016-06-20 DIAGNOSIS — E1143 Type 2 diabetes mellitus with diabetic autonomic (poly)neuropathy: Secondary | ICD-10-CM | POA: Diagnosis not present

## 2016-06-20 DIAGNOSIS — K3184 Gastroparesis: Secondary | ICD-10-CM | POA: Diagnosis not present

## 2016-06-20 DIAGNOSIS — I25768 Atherosclerosis of bypass graft of coronary artery of transplanted heart with other forms of angina pectoris: Secondary | ICD-10-CM | POA: Diagnosis not present

## 2016-06-20 DIAGNOSIS — K760 Fatty (change of) liver, not elsewhere classified: Secondary | ICD-10-CM

## 2016-06-20 MED ORDER — AMBULATORY NON FORMULARY MEDICATION: 1 refills | Status: DC

## 2016-06-20 NOTE — Patient Instructions (Addendum)
If you are age 10165 or older, your body mass index should be between 23-30. Your Body mass index is 41.39 kg/m. If this is out of the aforementioned range listed, please consider follow up with your Primary Care Provider.  If you are age 10964 or younger, your body mass index should be between 19-25. Your Body mass index is 41.39 kg/m. If this is out of the aformentioned range listed, please consider follow up with your Primary Care Provider.   We have sent the following medications to your pharmacy for you to pick up at your convenience:  Domperidone   Please follow up with Dr Adela LankArmbruster in 3 months. You will be contacted when it is time to schedule this.  Please go to the lab in 3 months to have blood work.  You have been scheduled for a EKG with [facility] on Wednesday March 28th at 10:30am. The procedure will take around 15 minutes. If you need to reschedule their telephone number is [phone].  Thank you.

## 2016-06-20 NOTE — Progress Notes (Signed)
HPI :  51 year old female here for a follow-up visit today regarding her symptoms of bloating, nausea and gastroparesis.  She has a history of type 1 diabetes on insulin pump and coronary artery disease status post stenting and CABG.  Patient was admitted to the hospital from 04/13/16 through 04/16/16 for a complaint of dysphagia as well as abdominal bloating symptoms, along with a chest tightness which occurred after eating. She had a full cardiac workup which was negative. She did have a gastric emptying study during her stay which showed delayed emptying. She was started on Erythromycin 250 mg 4 times a day 30 minutes before meals and at bedtime which did help at the time. Patient also increased her PPI to twice a day dosing due to reflux. She otherwise complained of dysphagia at her follow up visit and some recurrence of her bloating / gastroparesis symptoms, which led to an EGD on 04/19/16. EGD showed normal esophagus with empiric dilation done to 18mm with small mucosal wrent in the upper esophagus, normal stomach and duodenum.  We added some Gaviscon in the interim for her symptoms and resumed erythromycin. She has seen cardiology since our last visit and had an EKG showing QTc of , in preparation for possible domperidone use (needs to be < for females) She has had intolerance to Reglan in the past.   She reports benefit from the dilation. She is doing much better with swallowing. She reports gastroparesis is bothering her. She has significant bloating her abdomen after she eats and it "comes back up". She has some nausea but is not vomiting. She is trying to eat smaller amounts. She is "scared" to eat due to symptoms, eating bland foods / liquids. She states blood sugars have been better controlled. Last A1c at 7.3. She states erythromycin worked initially but then effect faded and is off it. She thinks she was on Domperidone back in 2008 for a period of time and she thinks it worked  for her.   We otherwise discussed fatty liver noted on imaging. She denies alcohol use. She does drink coffee.   EGD 04/19/16 - normal esophagus, empiric dilation with 18mm Savory showed small wrent in the upper esophagus, with normal stomach and duodenum, biopsies obtained showing normal small bowel and stomach EGD 03/2009 - mild esophagitis and GEJ stricture, dilated to 18mm GES 07/2011 - normal GES 2011 - normal   Past Medical History:  Diagnosis Date  . Anxiety   . Asthma   . CAD (coronary artery disease)    4v CABG, 9/10; NL LVF, status post followup Cardiolite November 2012 no ischemia ejection fraction 65%  . Diabetes mellitus    Scheduled to go on insulin pump  . Dyslipidemia     LDL 22 mg percent on Crestor  . Ejection fraction   . Esophageal stricture   . Gastroparesis due to DM (HCC)   . GERD (gastroesophageal reflux disease)   . History of heart attack   . Hx of CABG    September, 2010  . Hypertension   . Hypothyroidism      Past Surgical History:  Procedure Laterality Date  . ANGIOPLASTY     with stenting   . CARDIAC SURGERY    . CESAREAN SECTION    . CORONARY ARTERY BYPASS GRAFT  01/26/2009  . EYE SURGERY    . open heart surgery   2010   Family History  Problem Relation Age of Onset  . Heart disease Father   .  Diabetes Mother   . Colon cancer Neg Hx    Social History  Substance Use Topics  . Smoking status: Former Smoker    Packs/day: 1.00    Years: 3.00    Types: Cigarettes    Start date: 03/14/1991    Quit date: 05/22/1993  . Smokeless tobacco: Never Used     Comment:  Year Quit: 1995  . Alcohol use No   Current Outpatient Prescriptions  Medication Sig Dispense Refill  . ADVAIR DISKUS 250-50 MCG/DOSE AEPB Inhale 1 puff into the lungs as needed.    Marland Kitchen albuterol (PROVENTIL) (2.5 MG/3ML) 0.083% nebulizer solution Take 2.5 mg by nebulization every 4 (four) hours as needed. For wheezing.    Marland Kitchen albuterol (VENTOLIN HFA) 108 (90 BASE) MCG/ACT inhaler  Inhale 2 puffs into the lungs every 6 (six) hours as needed for wheezing or shortness of breath.     . ALPRAZolam (XANAX) 0.5 MG tablet Take 1 tablet (0.5 mg total) by mouth 2 (two) times daily. (Patient taking differently: Take 0.5 mg by mouth daily. ) 60 tablet 0  . aspirin 81 MG chewable tablet Chew 81 mg by mouth daily.    Marland Kitchen erythromycin (ERY-TAB) 250 MG EC tablet Take 250 mg by mouth 4 (four) times daily.    . fish oil-omega-3 fatty acids 1000 MG capsule Take 2 capsules (2 g total) by mouth 2 (two) times daily.    . Fluticasone-Salmeterol (ADVAIR DISKUS) 100-50 MCG/DOSE AEPB Inhale 1 puff into the lungs 2 (two) times daily as needed (wheezing).     . furosemide (LASIX) 40 MG tablet TAKE 1 TABLET (40 MG TOTAL) BY MOUTH DAILY. 30 tablet 6  . hyoscyamine (LEVSIN SL) 0.125 MG SL tablet Place 1 tablet (0.125 mg total) under the tongue every 8 (eight) hours as needed. (Patient taking differently: Place 0.125 mg under the tongue every 8 (eight) hours as needed for cramping. ) 60 tablet 0  . Insulin Human (INSULIN PUMP) SOLN Inject into the skin continuous. NOVOLOG INSULIN    . levothyroxine (SYNTHROID, LEVOTHROID) 100 MCG tablet Take 100 mcg by mouth daily before breakfast.    . metoprolol tartrate (LOPRESSOR) 25 MG tablet TAKE 0.5 TABLETS (12.5 MG TOTAL) BY MOUTH 2 (TWO) TIMES DAILY. (Patient taking differently: TAKE 0.5 TABLETS (12.5 MG TOTAL) BY MOUTH DAILY.) 30 tablet 6  . nitroGLYCERIN (NITROSTAT) 0.4 MG SL tablet Place 0.4 mg under the tongue every 5 (five) minutes as needed for chest pain.    Marland Kitchen PREVACID SOLUTAB 30 MG disintegrating tablet TAKE 1 TABLET THIRTY MINUTES BEFORE BREAKFAST AND THIRTY MINUTES BEFORE SUPPER 60 tablet 3  . rosuvastatin (CRESTOR) 20 MG tablet Take 1 tablet (20 mg total) by mouth daily. 90 tablet 3  . tiZANidine (ZANAFLEX) 4 MG tablet Take 0.5 tablets (2 mg total) by mouth at bedtime as needed for muscle spasms. 15 tablet 1  . AMBULATORY NON FORMULARY MEDICATION Domperidone  10mg  Sig: Take one tablet by mouth three times daily 180 tablet 1  . DULoxetine (CYMBALTA) 30 MG capsule Take 2 capsules by mouth at bedtime.  1   Current Facility-Administered Medications  Medication Dose Route Frequency Provider Last Rate Last Dose  . 0.9 %  sodium chloride infusion  500 mL Intravenous Continuous Ruffin Frederick, MD       Allergies  Allergen Reactions  . Metformin Anaphylaxis  . Metformin And Related Shortness Of Breath  . Penicillins Anaphylaxis    Throat swells difficulty breathing  . Metoclopramide Other (See Comments)  Gave her bad dreams, nightmares.      Review of Systems: All systems reviewed and negative except where noted in HPI.   Lab Results  Component Value Date   WBC 12.6 (H) 04/14/2016   HGB 14.0 04/14/2016   HCT 42.7 04/14/2016   MCV 84.4 04/14/2016   PLT 323 04/14/2016    Lab Results  Component Value Date   CREATININE 0.70 04/15/2016   BUN 6 04/15/2016   NA 136 04/15/2016   K 4.0 04/15/2016   CL 104 04/15/2016   CO2 22 04/15/2016    Lab Results  Component Value Date   ALT 12 (L) 04/13/2016   AST 18 04/13/2016   ALKPHOS 102 04/13/2016   BILITOT 0.5 04/13/2016     Physical Exam: BP 110/64 (BP Location: Left Arm, Patient Position: Sitting, Cuff Size: Normal)   Pulse 68   Ht 5' 2.4" (1.585 m) Comment: height measured without shoes  Wt 229 lb 4 oz (104 kg)   BMI 41.39 kg/m  Constitutional: Pleasant,well-developed, female in no acute distress. HEENT: Normocephalic and atraumatic. Conjunctivae are normal. No scleral icterus. Neck supple.  Cardiovascular: Normal rate, regular rhythm.  Pulmonary/chest: Effort normal and breath sounds normal. No wheezing, rales or rhonchi. Abdominal: Soft, protuberant, nontender. There are no masses palpable. No hepatomegaly. Extremities: no edema Lymphadenopathy: No cervical adenopathy noted. Neurological: Alert and oriented to person place and time. Skin: Skin is warm and dry. No  rashes noted. Psychiatric: Normal mood and affect. Behavior is normal.   ASSESSMENT AND PLAN: 51 year old female here for reassessment of the following issues:  Gastroparesis - she's had prior intolerance to Reglan. She initially did well with erythromycin but sounds like she develop tachyphylaxis to this and has since stopped. She currently remains very symptomatic. I discussed options with her. She has previously been on domperidone several years ago and stated it worked really well for her at the time. She's been seen by cardiology and had an EKG with QTC 439ms. I discussed risks of domperidone with her at length to include cardiac arrhythmias. Following our discussion she strongly wished to proceed with domperidone. I wrote a prescription for 10 mg 3 times a day. I want to repeat her EKG in the next 6-8 weeks on domperidone to ensure no interval changes. She agreed.  Dysphagia - resolved after dilation, suspect related to subtle proximal esophagus stricture. She can follow-up as needed if symptoms recur.   Fatty liver - I counseled her fatty liver and long-term associated risks. Therapy is weight loss which she is working on. Recommend she avoid alcohol, while regular coffee consumption may be beneficial. Her ALT is normal at this time, would repeat it at least once per year to ensure stable.  Ileene PatrickSteven Armbruster, MD [facility] Pager [phone]

## 2016-06-29 ENCOUNTER — Other Ambulatory Visit: Payer: Self-pay | Admitting: Cardiovascular Disease

## 2016-07-06 DIAGNOSIS — J4530 Mild persistent asthma, uncomplicated: Secondary | ICD-10-CM | POA: Insufficient documentation

## 2016-08-16 ENCOUNTER — Ambulatory Visit (HOSPITAL_COMMUNITY)
Admission: RE | Admit: 2016-08-16 | Discharge: 2016-08-16 | Disposition: A | Discharge status: Home / Self Care | Payer: Medicare Other | Source: Ambulatory Visit | Attending: Gastroenterology | Admitting: Gastroenterology

## 2016-08-16 DIAGNOSIS — E1143 Type 2 diabetes mellitus with diabetic autonomic (poly)neuropathy: Secondary | ICD-10-CM | POA: Insufficient documentation

## 2016-08-16 DIAGNOSIS — K3184 Gastroparesis: Secondary | ICD-10-CM | POA: Insufficient documentation

## 2016-08-16 DIAGNOSIS — K760 Fatty (change of) liver, not elsewhere classified: Secondary | ICD-10-CM | POA: Insufficient documentation

## 2016-08-16 DIAGNOSIS — R1310 Dysphagia, unspecified: Secondary | ICD-10-CM | POA: Diagnosis present

## 2016-08-16 DIAGNOSIS — R9431 Abnormal electrocardiogram [ECG] [EKG]: Secondary | ICD-10-CM | POA: Diagnosis not present

## 2016-08-29 ENCOUNTER — Ambulatory Visit (INDEPENDENT_AMBULATORY_CARE_PROVIDER_SITE_OTHER): Payer: Medicare Other | Admitting: Ophthalmology

## 2016-08-29 DIAGNOSIS — E103592 Type 1 diabetes mellitus with proliferative diabetic retinopathy without macular edema, left eye: Secondary | ICD-10-CM

## 2016-08-29 DIAGNOSIS — E103391 Type 1 diabetes mellitus with moderate nonproliferative diabetic retinopathy without macular edema, right eye: Secondary | ICD-10-CM | POA: Diagnosis not present

## 2016-08-29 DIAGNOSIS — H43813 Vitreous degeneration, bilateral: Secondary | ICD-10-CM | POA: Diagnosis not present

## 2016-08-29 DIAGNOSIS — H353112 Nonexudative age-related macular degeneration, right eye, intermediate dry stage: Secondary | ICD-10-CM | POA: Diagnosis not present

## 2016-08-29 DIAGNOSIS — E10319 Type 1 diabetes mellitus with unspecified diabetic retinopathy without macular edema: Secondary | ICD-10-CM | POA: Diagnosis not present

## 2016-08-29 DIAGNOSIS — H35033 Hypertensive retinopathy, bilateral: Secondary | ICD-10-CM

## 2016-08-29 DIAGNOSIS — I10 Essential (primary) hypertension: Secondary | ICD-10-CM | POA: Diagnosis not present

## 2016-08-30 DIAGNOSIS — E11319 Type 2 diabetes mellitus with unspecified diabetic retinopathy without macular edema: Secondary | ICD-10-CM | POA: Insufficient documentation

## 2016-09-22 ENCOUNTER — Encounter (INDEPENDENT_AMBULATORY_CARE_PROVIDER_SITE_OTHER): Payer: Medicare Other

## 2016-09-22 DIAGNOSIS — K760 Fatty (change of) liver, not elsewhere classified: Secondary | ICD-10-CM

## 2016-09-22 LAB — HEPATIC FUNCTION PANEL
ALBUMIN: 3.8 g/dL (ref 3.5–5.2)
ALT: 10 U/L (ref 0–35)
AST: 13 U/L (ref 0–37)
Alkaline Phosphatase: 91 U/L (ref 39–117)
Bilirubin, Direct: 0.1 mg/dL (ref 0.0–0.3)
TOTAL PROTEIN: 6.7 g/dL (ref 6.0–8.3)
Total Bilirubin: 0.5 mg/dL (ref 0.2–1.2)

## 2016-09-23 ENCOUNTER — Other Ambulatory Visit: Payer: Self-pay | Admitting: Cardiovascular Disease

## 2016-09-28 ENCOUNTER — Encounter: Payer: Self-pay | Admitting: Gastroenterology

## 2016-10-01 ENCOUNTER — Emergency Department (HOSPITAL_COMMUNITY): Payer: Medicare Other

## 2016-10-01 ENCOUNTER — Encounter (HOSPITAL_COMMUNITY): Payer: Self-pay | Admitting: Emergency Medicine

## 2016-10-01 ENCOUNTER — Emergency Department (HOSPITAL_COMMUNITY)
Admission: EM | Admit: 2016-10-01 | Discharge: 2016-10-01 | Disposition: A | Discharge status: Home / Self Care | Payer: Medicare Other | Attending: Emergency Medicine | Admitting: Emergency Medicine

## 2016-10-01 DIAGNOSIS — Z7982 Long term (current) use of aspirin: Secondary | ICD-10-CM | POA: Insufficient documentation

## 2016-10-01 DIAGNOSIS — E039 Hypothyroidism, unspecified: Secondary | ICD-10-CM | POA: Diagnosis not present

## 2016-10-01 DIAGNOSIS — J45909 Unspecified asthma, uncomplicated: Secondary | ICD-10-CM | POA: Diagnosis not present

## 2016-10-01 DIAGNOSIS — E119 Type 2 diabetes mellitus without complications: Secondary | ICD-10-CM | POA: Insufficient documentation

## 2016-10-01 DIAGNOSIS — Z79899 Other long term (current) drug therapy: Secondary | ICD-10-CM | POA: Diagnosis not present

## 2016-10-01 DIAGNOSIS — Z794 Long term (current) use of insulin: Secondary | ICD-10-CM | POA: Diagnosis not present

## 2016-10-01 DIAGNOSIS — R1310 Dysphagia, unspecified: Secondary | ICD-10-CM | POA: Diagnosis present

## 2016-10-01 DIAGNOSIS — I2510 Atherosclerotic heart disease of native coronary artery without angina pectoris: Secondary | ICD-10-CM | POA: Diagnosis not present

## 2016-10-01 DIAGNOSIS — Z87891 Personal history of nicotine dependence: Secondary | ICD-10-CM | POA: Diagnosis not present

## 2016-10-01 DIAGNOSIS — I10 Essential (primary) hypertension: Secondary | ICD-10-CM | POA: Diagnosis not present

## 2016-10-01 MED ORDER — GI COCKTAIL ~~LOC~~
30.0000 mL | Freq: Once | ORAL | Status: AC
  Administered 2016-10-01: 30 mL via ORAL
  Filled 2016-10-01: qty 30

## 2016-10-01 MED ORDER — PREDNISONE 20 MG PO TABS: ORAL_TABLET | ORAL | 0 refills | Status: DC

## 2016-10-01 MED ORDER — PREDNISOLONE SODIUM PHOSPHATE 15 MG/5ML PO SOLN
60.0000 mg | Freq: Once | ORAL | Status: AC
  Administered 2016-10-01: 60 mg via ORAL
  Filled 2016-10-01: qty 4

## 2016-10-01 MED ORDER — ALBUTEROL SULFATE HFA 108 (90 BASE) MCG/ACT IN AERS
2.0000 | INHALATION_SPRAY | Freq: Once | RESPIRATORY_TRACT | Status: AC
  Administered 2016-10-01: 2 via RESPIRATORY_TRACT
  Filled 2016-10-01: qty 6.7

## 2016-10-01 MED ORDER — LIDOCAINE VISCOUS 2 % MT SOLN: 20.0000 mL | OROMUCOSAL | 0 refills | Status: DC | PRN

## 2016-10-01 MED ORDER — IPRATROPIUM-ALBUTEROL 0.5-2.5 (3) MG/3ML IN SOLN
3.0000 mL | RESPIRATORY_TRACT | Status: DC
  Administered 2016-10-01: 3 mL via RESPIRATORY_TRACT
  Filled 2016-10-01: qty 3

## 2016-10-01 NOTE — ED Provider Notes (Signed)
[facility]-EM[facility]Y DEPT Provider Note   CSN: [id number] Arrival date & time: 10/01/16  7829   By signing my name below, I, Bobbie Stack, attest that this documentation has been prepared under the direction and in the presence of Yarethzi Branan, Barbara Cower, MD. Electronically Signed: Bobbie Stack, Scribe. 10/01/16. 9:29 AM. History   Chief Complaint Chief Complaint  Patient presents with  . lump in throat    The history is provided by the patient. No language interpreter was used.  HPI Comments: [name] is a 51 y.o. female with hx of GERD, gastroparesis, and anxiety who presents to the Emergency Department complaining of difficulty swallowing since last night. She primarily has difficulty swallowing solid foods. She states that took Prevacid 30 minutes prior to eating last night. While she was eating last night, she felt like something got stuck in her throat. She describes the feeling as a "pressure or tightness" sensation in her throat since this time. She also reports SOB and non-productive cough since this incident last night. She states that she had difficulty sleeping last night due to the lump in her throat. She states that she has had difficulty swallowing pills in the past and she typically has to dissolved the pills in water. She states that she had her throat stretched in November of 2017. She denies tobacco use currently. She also denies any fevers.  Past Medical History:  Diagnosis Date  . Anxiety   . Asthma   . CAD (coronary artery disease)    4v CABG, 9/10; NL LVF, status post followup Cardiolite November 2012 no ischemia ejection fraction 65%  . Diabetes mellitus    Scheduled to go on insulin pump  . Dyslipidemia     LDL 22 mg percent on Crestor  . Ejection fraction   . Esophageal stricture   . Gastroparesis due to DM (HCC)   . GERD (gastroesophageal reflux disease)   . History of heart attack   . Hx of CABG    September, 2010  . Hypertension   . Hypothyroidism      Patient Active Problem List   Diagnosis Date Noted  . Diabetes mellitus with complication (HCC)   . Atypical chest pain   . Muscle spasm 08/20/2014  . Obesity, unspecified 08/03/2013  . Hx of CABG   . Ejection fraction   . HTN (hypertension) 08/22/2012  . Hypothyroidism 09/29/2011  . Edema 08/25/2011  . Gastroparesis 09/07/2010  . Hyperlipidemia 02/21/2010  . Muscle spasm of left shoulder 04/29/2009  . PALPITATIONS 04/12/2009  . WHEEZING 04/12/2009  . GERD 01/19/2009  . Chest pain 02/04/2008  . Diabetes mellitus type 2, insulin dependent (HCC) 06/12/2007  . Anxiety disorder 06/12/2007  . Coronary artery disease involving coronary bypass graft of native heart 06/12/2007  . Asthma in adult 06/12/2007    Past Surgical History:  Procedure Laterality Date  . ANGIOPLASTY     with stenting   . CARDIAC SURGERY    . CESAREAN SECTION    . CORONARY ARTERY BYPASS GRAFT  01/26/2009  . EYE SURGERY    . open heart surgery   2010    OB History    No data available       Home Medications    Prior to Admission medications   Medication Sig Start Date End Date Taking? Authorizing Provider  albuterol (PROVENTIL) (2.5 MG/3ML) 0.083% nebulizer solution Take 2.5 mg by nebulization every 4 (four) hours as needed for wheezing or shortness of breath.    Yes [provider]  albuterol (VENTOLIN HFA) 108 (90 BASE) MCG/ACT inhaler Inhale 2 puffs into the lungs every 6 (six) hours as needed for wheezing or shortness of breath.    Yes [provider]  ALPRAZolam Prudy Feeler(XANAX) 0.5 MG tablet Take 0.5 mg by mouth daily.    Yes [provider]  AMBULATORY NON FORMULARY MEDICATION Domperidone 10mg  Sig: Take one tablet by mouth three times daily Patient taking differently: Take 10 mg by mouth 3 (three) times daily. Domperidone 10mg  06/20/16  Yes Armbruster, Reeves ForthSteven Paul, MD  aspirin 81 MG chewable tablet Chew 81 mg by mouth daily.   Yes [provider]  fish oil-omega-3  fatty acids 1000 MG capsule Take 2 capsules (2 g total) by mouth 2 (two) times daily. 07/03/12  Yes Serpe, Clide DeutscherEugene C, PA-C  Fluticasone-Salmeterol (ADVAIR) 250-50 MCG/DOSE AEPB Inhale 1 puff into the lungs 2 (two) times daily.   Yes [provider]  furosemide (LASIX) 40 MG tablet TAKE 1 TABLET (40 MG TOTAL) BY MOUTH DAILY. 02/04/15  Yes Luis AbedKatz, Jeffrey D, MD  hyoscyamine (LEVSIN SL) 0.125 MG SL tablet Place 1 tablet (0.125 mg total) under the tongue every 8 (eight) hours as needed. Patient taking differently: Place 0.125 mg under the tongue every 8 (eight) hours as needed for cramping.  07/02/15  Yes Armbruster, Reeves ForthSteven Paul, MD  Insulin Human (INSULIN PUMP) SOLN Pt uses up to 150 units of NOVOLOG daily.   Yes [provider]  lansoprazole (PREVACID SOLUTAB) 30 MG disintegrating tablet Take 30 mg by mouth every evening.   Yes [provider]  levothyroxine (SYNTHROID, LEVOTHROID) 100 MCG tablet Take 100 mcg by mouth daily before breakfast.   Yes [provider]  metoprolol tartrate (LOPRESSOR) 25 MG tablet TAKE 0.5 TABLETS (12.5 MG TOTAL) BY MOUTH 2 (TWO) TIMES DAILY. Patient taking differently: Take 0.5 tablet by mouth once daily 09/17/15  Yes Laqueta LindenKoneswaran, Suresh A, MD  nitroGLYCERIN (NITROSTAT) 0.4 MG SL tablet Place 0.4 mg under the tongue every 5 (five) minutes as needed for chest pain.   Yes [provider]  Pseudoeph-Doxylamine-DM-APAP (NYQUIL PO) Take 30 mLs by mouth at bedtime as needed (for cough).   Yes [provider]  rosuvastatin (CRESTOR) 20 MG tablet TAKE 1 TABLET (20 MG TOTAL) BY MOUTH DAILY. 06/29/16  Yes Laqueta LindenKoneswaran, Suresh A, MD  tiZANidine (ZANAFLEX) 4 MG tablet Take 2-4 mg by mouth at bedtime as needed for muscle spasms.   Yes [provider]  lidocaine (XYLOCAINE) 2 % solution Use as directed 20 mLs in the mouth or throat as needed for mouth pain. 10/01/16   Latonja Bobeck, Barbara CowerJason, MD  predniSONE (DELTASONE) 20 MG tablet 2 tabs po daily x  4 days 10/01/16   Gavriela Cashin, Barbara CowerJason, MD    Family History Family History  Problem Relation Age of Onset  . Heart disease Father   . Diabetes Mother   . Colon cancer Neg Hx     Social History Social History  Substance Use Topics  . Smoking status: Former Smoker    Packs/day: 1.00    Years: 3.00    Types: Cigarettes    Start date: 03/14/1991    Quit date: 05/22/1993  . Smokeless tobacco: Never Used     Comment:  Year Quit: 1995  . Alcohol use No     Allergies   Metformin; Penicillins; and Metoclopramide   Review of Systems Review of Systems  Constitutional: Negative for chills and fever.  HENT: Positive for trouble swallowing.  Respiratory: Positive for shortness of breath.   Cardiovascular: Negative for chest pain.  All other systems reviewed and are negative.   Physical Exam Updated Vital Signs BP 133/60 (BP Location: Left Arm)   Pulse 74   Temp 98.3 F (36.8 C) (Oral)   Resp 20   Ht 5\' 4"  (1.626 m)   Wt 223 lb (101.2 kg)   LMP 08/02/2016 (Within Weeks)   SpO2 97%   BMI 38.28 kg/m   Physical Exam  Constitutional: She is oriented to person, place, and time. She appears well-developed and well-nourished.  HENT:  Head: Normocephalic and atraumatic.  Eyes: EOM are normal.  Neck: Normal range of motion. No thyroid mass present.  No masses in neck. No tenderness.  Cardiovascular: Normal rate.   Pulmonary/Chest: Effort normal. No stridor. She has wheezes.  Diffuse wheezing. Handling secretions.  Abdominal: She exhibits no distension.  Musculoskeletal: Normal range of motion.  Neurological: She is alert and oriented to person, place, and time.  Skin: Skin is warm and dry.  Psychiatric: She has a normal mood and affect.  Nursing note and vitals reviewed.   ED Treatments / Results  DIAGNOSTIC STUDIES: Oxygen Saturation is 97% on RA, normal by my interpretation.    COORDINATION OF CARE: 9:12 AM Discussed treatment plan with pt at bedside and pt agreed to  plan. I will check her EKG, CXR, and labs. I will give her a breathing treatment.  Labs (all labs ordered are listed, but only abnormal results are displayed) Labs Reviewed - No data to display  EKG  EKG Interpretation  Date/Time:  Sunday Oct 01 2016 09:45:51 EDT Ventricular Rate:  84 PR Interval:    QRS Duration: 102 QT Interval:  365 QTC Calculation: 432 R Axis:   68 Text Interpretation:  Sinus rhythm Borderline repolarization abnormality Baseline wander in lead(s) V2 nonspecific changes in septal leads unchanged No significant change since last tracing Confirmed by [facility] MD, Barbara Cower [phone]) on 10/01/2016 10:12:48 AM       Radiology Dg Chest 2 View  Result Date: 10/01/2016 CLINICAL DATA:  Lump in throat. Shortness of breath and difficulty swallowing. EXAM: CHEST  2 VIEW COMPARISON:  Chest x-ray dated 04/13/2016. FINDINGS: Heart size and mediastinal contours are stable. Median sternotomy wires appear intact and stable in alignment, status post CABG. Lungs are clear. No pleural effusion or pneumothorax seen. Mild degenerative spurring within the slightly kyphotic thoracic spine. No acute or suspicious osseous finding. IMPRESSION: No active cardiopulmonary disease. No evidence of pneumonia or pulmonary edema. Electronically Signed   By: Bary Richard M.D.   On: 10/01/2016 09:44    Procedures Procedures (including critical care time)  Medications Ordered in ED Medications  prednisoLONE (ORAPRED) 15 MG/5ML solution 60 mg (60 mg Oral Given 10/01/16 0939)  gi cocktail (Maalox,Lidocaine,Donnatal) (30 mLs Oral Given 10/01/16 0939)  albuterol (PROVENTIL HFA;VENTOLIN HFA) 108 (90 Base) MCG/ACT inhaler 2 puff (2 puffs Inhalation Given 10/01/16 1227)     Initial Impression / Assessment and Plan / ED Course  I have reviewed the triage vital signs and the nursing notes.  Pertinent labs & imaging results that were available during my care of the patient were reviewed by me and considered in my  medical decision making (see chart for details).     Doubt any mechanical obstruction of her throat as she is able tolerate by mouth the whole time she is here. May be related to bronchitis versus some type of scratch or trauma in her throat.  She will follow up with GI as soon as possible but is in no acute distress at this time and no evidence of dehydration or impacted esophagus.  Final Clinical Impressions(s) / ED Diagnoses   Final diagnoses:  Dysphagia, unspecified type    New Prescriptions Discharge Medication List as of 10/01/2016 12:10 PM    START taking these medications   Details  lidocaine (XYLOCAINE) 2 % solution Use as directed 20 mLs in the mouth or throat as needed for mouth pain., Starting Sun 10/01/2016, Print    predniSONE (DELTASONE) 20 MG tablet 2 tabs po daily x 4 days, Print       I personally performed the services described in this documentation, which was scribed in my presence. The recorded information has been reviewed and is accurate.   Marily Memos, MD 10/01/16 [phone]

## 2016-10-01 NOTE — ED Triage Notes (Signed)
C/o lump in throat since last night.  Took Prevacid 30 minutes before eating [place] burger and cheese bites and took Domeridone 30 minutes after eating.  Starting feeling lump in throat while eating.  C/o SOB and having difficulty swallowing.  Says throat feelings like a lump in her throat.  Had Throat stretched Nov 2017 [place]

## 2016-10-02 ENCOUNTER — Telehealth: Payer: Self-pay

## 2016-10-02 NOTE — Telephone Encounter (Signed)
Patient did not answer the phone, left message that I was checking in with her to see how she is doing following her ER visit. Asked that she call back to give us an update.

## 2016-10-03 ENCOUNTER — Telehealth: Payer: Self-pay

## 2016-10-03 ENCOUNTER — Telehealth: Payer: Self-pay | Admitting: Gastroenterology

## 2016-10-03 NOTE — Telephone Encounter (Signed)
Spoke to patient today, she states that she is having increased worsening of intermittent dysphagia. She takes all of her medications and dissolves them in water prior to taking. She noticed that she had more difficulty after doing this with her Domperidone. I advised her to check with the pharmacy on whether this medication can be dissolved, that sometimes the coating on the mediation is there to help protect. She will contact the pharmacy to see if this is advisable.  ED advised her to make a follow up appointment with our office. Please advise.

## 2016-10-03 NOTE — Telephone Encounter (Signed)
Spoke with pt and she is aware. Previsit scheduled for 10/06/16@2pm , EGD with dil scheduled in the [facility] 10/19/16@11am . Pt aware of appts.

## 2016-10-03 NOTE — Telephone Encounter (Signed)
Thanks Kim FanningJulie, If she is having recurrent dysphagia, I think a repeat EGD is a good idea. 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 We can book her for next available EGD. Thanks much

## 2016-10-03 NOTE — Telephone Encounter (Signed)
See phone note

## 2016-10-05 ENCOUNTER — Telehealth: Payer: Self-pay | Admitting: *Deleted

## 2016-10-05 NOTE — Telephone Encounter (Signed)
Mrs [name] is scheduled for a PV Friday 5-18 with an EGD for 5-31 Thursday with Dr Adela LankArmbruster.  She is on an insulin pump. Can you please get her instructions for her from her doctor  Thanks. Hilda LiasMarie PV

## 2016-10-05 NOTE — Telephone Encounter (Signed)
Raynelle FanningJulie, Can you help with this please?

## 2016-10-05 NOTE — Telephone Encounter (Signed)
Note from Jacques EarthlySandy Adcock, NP sent to be scanned.  Per her note, on the morning of the procedure if pts glucose is greater than 200 then she will only use 1/2 of the sliding scale for correction.

## 2016-10-05 NOTE — Telephone Encounter (Signed)
Note sent to NP [facility] Ad[facility] today.

## 2016-10-05 NOTE — Telephone Encounter (Signed)
Spoke to [place], who will send out the insulin letter to doctor that manages her insulin pump.

## 2016-10-06 ENCOUNTER — Encounter: Payer: Self-pay | Admitting: Gastroenterology

## 2016-10-06 ENCOUNTER — Encounter: Payer: Self-pay | Admitting: *Deleted

## 2016-10-06 ENCOUNTER — Ambulatory Visit (AMBULATORY_SURGERY_CENTER): Payer: Self-pay | Admitting: *Deleted

## 2016-10-06 ENCOUNTER — Telehealth: Payer: Self-pay | Admitting: *Deleted

## 2016-10-06 VITALS — Ht 64.0 in | Wt 223.0 lb

## 2016-10-06 DIAGNOSIS — R1310 Dysphagia, unspecified: Secondary | ICD-10-CM

## 2016-10-06 NOTE — Progress Notes (Signed)
No egg or soy allergy known to patient  No issues with past sedation with any surgeries  or procedures, no intubation problems  No diet pills per patient No home 02 use per patient  No blood thinners per patient  Pt denies issues with constipation  No A fib or A flutter  EMMI video declined- pt states had egd in November  Pt states the last 3-4 days she has "lump" in her throat - she states hard to swallow solids but liquids are no issues - area is sore and very hard to swallow- frustrating - TE to [employer]

## 2016-10-06 NOTE — Telephone Encounter (Signed)
Dr Havery Moros,  I saw [name] in [facility] today for her instructions for an EGD 10-19-16. Pt states the last 3-4 days she has "lump" in her throat - she states hard to swallow solids but liquids are no issues - area is sore and very hard to swallow- frustrating to her and she's scared to eat . She states she does gag a lot. She wanted met o ask is there anything she can do to help this before the [address] st. She does okay with soft foods and liquids.   Please advise and thanks for your time , [name]

## 2016-10-09 NOTE — Telephone Encounter (Signed)
Spoke with patient. Gave her Dr.Armbruster's recommendations. She states still having throat issues and would like to see our PA. Appointment made for Wed. 10-11-16 at 1115 am. Patient aware.

## 2016-10-09 NOTE — Telephone Encounter (Signed)
Thanks for your note. I am unfortunately out of the office this week. She should have her throat examined by either her PCM or one of the APPs, ensure no infection or other problem with her throat / posterior pharynx, while we await EGD. Thanks

## 2016-10-11 ENCOUNTER — Encounter: Payer: Self-pay | Admitting: Physician Assistant

## 2016-10-11 ENCOUNTER — Ambulatory Visit (INDEPENDENT_AMBULATORY_CARE_PROVIDER_SITE_OTHER): Payer: Medicare Other | Admitting: Physician Assistant

## 2016-10-11 VITALS — BP 120/60 | HR 80 | Ht 64.0 in | Wt 223.0 lb

## 2016-10-11 DIAGNOSIS — K2289 Other specified disease of esophagus: Secondary | ICD-10-CM

## 2016-10-11 DIAGNOSIS — I25768 Atherosclerosis of bypass graft of coronary artery of transplanted heart with other forms of angina pectoris: Secondary | ICD-10-CM | POA: Diagnosis not present

## 2016-10-11 DIAGNOSIS — K228 Other specified diseases of esophagus: Secondary | ICD-10-CM

## 2016-10-11 DIAGNOSIS — R1310 Dysphagia, unspecified: Secondary | ICD-10-CM | POA: Diagnosis not present

## 2016-10-11 MED ORDER — AMBULATORY NON FORMULARY MEDICATION: 5.0000 mL | 1 refills | Status: DC | PRN

## 2016-10-11 MED ORDER — PANTOPRAZOLE SODIUM 40 MG PO TBEC: 40.0000 mg | DELAYED_RELEASE_TABLET | Freq: Every day | ORAL | 3 refills | Status: DC

## 2016-10-11 NOTE — Progress Notes (Signed)
Chief Complaint: Dysphagia, esophageal discomfort  HPI:   [name] is a 51 year old Caucasian female with past medical history listed below, who returns to clinic today fo complaint of dysphagia and esophageal discomfort.    Patient was seen in clinic on 04/18/16 by me. At that time, she was following up after recently being seen in the hospital 04/13/16-04/16/16 for dysphagia as well as abdominal bloating symptoms and chest tightness. At that visit the patient described feeling "just horrible", she become tearful during the interview and discussed that she felt like her throat was closing up and that the food was filling up from the bottom of her stomach to the top. She also described constant nausea and diarrhea. She also noted symptoms of food getting stuck on its way down. At that time her diagnosis of gastroparesis was discussed and she was told to continue her erythromycin. We also discussed domperidone in the future. She was continued on Prevacid 30 mg twice a day and Zantac 150 mg at night and the patient was scheduled for an EGD.   Patient had EGD on 04/19/16 with findings of a normal esophagus empirically dilated with a savory dilator to 18 mm and this was otherwise normal. Recommend patient have repeat when necessary for symptoms.   Patient then had follow in the clinic on 06/20/16 with Dr. Adela Lank for gastroparesis and symptoms of bloating nausea. At that time she was prescribed Domperidone 10 mg 3 times a day.   Patient then called our office 10/03/16 and it was recommended she have a repeat EGD as she had recurrent dysphagia.   Today, the patient presents to clinic and tells me that she has "terrible" dysphagia noting that she wakes up and feels as though there is a lump in her throat which makes her "hyperventilate", she tells me that this "lump" in her throat will sometimes go away in the morning if she takes her inhaler. This lump also appear after eating. Patient tells me she has  been using her Prevacid 30 mg twice a day 30 minutes before meals and will feel fine before eating but afterwards feels as though there is "a lump in there again". At those times patient has been using Mylanta which seems to help this feeling. Most recently she has changed to a mostly liquid/soft diet for fear of something getting stuck.   Patient denies fever, chills, blood in her stool, melena, change in bowel habits, weight loss, fatigue Nexium, nausea, vomiting, heartburn, reflux, symptoms that awaken her at night or acute esophageal pain  Past GI history:     EGD 04/19/16 - normal esophagus, empiric dilation with 18mm Savory showed small wrent in the upper esophagus, with normal stomach and duodenum, biopsies obtained showing normal small bowel and stomach EGD 03/2009 - mild esophagitis and GEJ stricture, dilated to 18mm GES 07/2011 - normal GES 2011 - normal   Past Medical History:  Diagnosis Date  . Allergy   . Anxiety   . Asthma   . Blood transfusion without reported diagnosis    2010 CABG  . CAD (coronary artery disease)    4v CABG, 9/10; NL LVF, status post followup Cardiolite November 2012 no ischemia ejection fraction 65%  . Cataract    are forming  . Diabetes mellitus    Scheduled to go on insulin pump  . Dyslipidemia     LDL 22 mg percent on Crestor  . Ejection fraction   . Esophageal stricture   . Gastroparesis due to DM (  HCC)   . GERD (gastroesophageal reflux disease)   . History of heart attack   . Hx of CABG    September, 2010  . Hyperlipidemia   . Hypertension   . Hypothyroidism   . Myocardial infarction (HCC) 2010   2008 x 2 stenst, 209 x 2 stents. 01-2009 CABG x 4     Past Surgical History:  Procedure Laterality Date  . ANGIOPLASTY     with stenting 1610,9604  . CARDIAC SURGERY    . CESAREAN SECTION    . CORONARY ARTERY BYPASS GRAFT  01/26/2009  . EYE SURGERY    . open heart surgery   2010    Current Outpatient Prescriptions  Medication Sig Dispense  Refill  . albuterol (PROVENTIL) (2.5 MG/3ML) 0.083% nebulizer solution Take 2.5 mg by nebulization every 4 (four) hours as needed for wheezing or shortness of breath.     Marland Kitchen albuterol (VENTOLIN HFA) 108 (90 BASE) MCG/ACT inhaler Inhale 2 puffs into the lungs every 6 (six) hours as needed for wheezing or shortness of breath.     . ALPRAZolam (XANAX) 0.5 MG tablet Take 0.5 mg by mouth daily.     . AMBULATORY NON FORMULARY MEDICATION Domperidone 10mg  Sig: Take one tablet by mouth three times daily (Patient taking differently: Take 10 mg by mouth 3 (three) times daily. Domperidone 10mg ) 180 tablet 1  . aspirin 81 MG chewable tablet Chew 81 mg by mouth daily.    . fish oil-omega-3 fatty acids 1000 MG capsule Take 2 capsules (2 g total) by mouth 2 (two) times daily.    . Fluticasone-Salmeterol (ADVAIR) 250-50 MCG/DOSE AEPB Inhale 1 puff into the lungs 2 (two) times daily.    . furosemide (LASIX) 40 MG tablet TAKE 1 TABLET (40 MG TOTAL) BY MOUTH DAILY. 30 tablet 6  . glucose blood (CONTOUR NEXT TEST) test strip TEST BLOOD GLUCOSE EIGHT TIMES DAILY    . hyoscyamine (LEVSIN SL) 0.125 MG SL tablet Place 1 tablet (0.125 mg total) under the tongue every 8 (eight) hours as needed. (Patient taking differently: Place 0.125 mg under the tongue every 8 (eight) hours as needed for cramping. ) 60 tablet 0  . Insulin Human (INSULIN PUMP) SOLN Pt uses up to 150 units of NOVOLOG daily.    . lansoprazole (PREVACID SOLUTAB) 30 MG disintegrating tablet Take 30 mg by mouth every evening.    Marland Kitchen levothyroxine (SYNTHROID, LEVOTHROID) 100 MCG tablet Take 100 mcg by mouth daily before breakfast.    . metoprolol tartrate (LOPRESSOR) 25 MG tablet TAKE 0.5 TABLETS (12.5 MG TOTAL) BY MOUTH 2 (TWO) TIMES DAILY. (Patient taking differently: Take 0.5 tablet by mouth once daily) 30 tablet 6  . nitroGLYCERIN (NITROSTAT) 0.4 MG SL tablet Place 0.4 mg under the tongue every 5 (five) minutes as needed for chest pain.    .  Pseudoeph-Doxylamine-DM-APAP (NYQUIL PO) Take 30 mLs by mouth at bedtime as needed (for cough).    . rosuvastatin (CRESTOR) 20 MG tablet TAKE 1 TABLET (20 MG TOTAL) BY MOUTH DAILY. 90 tablet 3  . tiZANidine (ZANAFLEX) 4 MG tablet Take 2-4 mg by mouth at bedtime as needed for muscle spasms.     Current Facility-Administered Medications  Medication Dose Route Frequency Provider Last Rate Last Dose  . 0.9 %  sodium chloride infusion  500 mL Intravenous Continuous Armbruster, Reeves Forth, MD        Allergies as of 10/11/2016 - Review Complete 10/11/2016  Allergen Reaction Noted  . Metformin Anaphylaxis   .  Penicillins Anaphylaxis and Other (See Comments)   . Metoclopramide Other (See Comments) 04/14/2016    Family History  Problem Relation Age of Onset  . Heart disease Father   . Diabetes Mother   . Colon cancer Neg Hx   . Colon polyps Neg Hx   . Esophageal cancer Neg Hx   . Rectal cancer Neg Hx   . Stomach cancer Neg Hx     Social History   Social History  . Marital status: Single    Spouse name: N/A  . Number of children: 1  . Years of education: N/A   Occupational History  . disabilied    Social History Main Topics  . Smoking status: Former Smoker    Packs/day: 1.00    Years: 3.00    Types: Cigarettes    Start date: 03/14/1991    Quit date: 05/22/1993  . Smokeless tobacco: Never Used     Comment:  Year Quit: 1995  . Alcohol use No  . Drug use: No  . Sexual activity: Yes   Other Topics Concern  . Not on file   Social History Narrative  . No narrative on file    Review of Systems:    Constitutional: No weight loss, fever or chills Skin: No rash  Cardiovascular: No chest pain Respiratory: No SOB  Gastrointestinal: See HPI and otherwise negative   Physical Exam:  Vital signs: BP 120/60   Pulse 80   Ht 5\' 4"  (1.626 m)   Wt 223 lb (101.2 kg)   LMP 10/03/2016   BMI 38.28 kg/m   Constitutional:   Pleasant overweight Caucasian female appears to be in  NAD, Well developed, Well nourished, alert and cooperative Head:  Normocephalic and atraumatic. Eyes:   PEERL, EOMI. No icterus. Conjunctiva pink. Ears:  Normal auditory acuity. Neck:  Supple Throat: Oral cavity and pharynx without inflammation, swelling or lesion. Erythema in posterior pharynx, no exudate Respiratory: Respirations even and unlabored. Lungs clear to auscultation bilaterally.   No wheezes, crackles, or rhonchi.  Cardiovascular: Normal S1, S2. No MRG. Regular rate and rhythm. No peripheral edema, cyanosis or pallor.  Gastrointestinal:  Soft, nondistended, nontender. No rebound or guarding. Normal bowel sounds. No appreciable masses or hepatomegaly. Psychiatric:  Demonstrates good judgement and reason without abnormal affect or behaviors.  RELEVANT LABS AND IMAGING: CBC    Component Value Date/Time   WBC 12.6 (H) 04/14/2016 1249   RBC 5.06 04/14/2016 1249   HGB 14.0 04/14/2016 1249   HCT 42.7 04/14/2016 1249   PLT 323 04/14/2016 1249   MCV 84.4 04/14/2016 1249   MCH 27.7 04/14/2016 1249   MCHC 32.8 04/14/2016 1249   RDW 15.6 (H) 04/14/2016 1249   LYMPHSABS 2.8 06/16/2013 1326   MONOABS 0.5 06/16/2013 1326   EOSABS 0.2 06/16/2013 1326   BASOSABS 0.0 06/16/2013 1326    CMP     Component Value Date/Time   NA 136 04/15/2016 1026   K 4.0 04/15/2016 1026   CL 104 04/15/2016 1026   CO2 22 04/15/2016 1026   GLUCOSE 166 (H) 04/15/2016 1026   BUN 6 04/15/2016 1026   CREATININE 0.70 04/15/2016 1026   CALCIUM 9.5 04/15/2016 1026   PROT 6.7 09/22/2016 1141   ALBUMIN 3.8 09/22/2016 1141   AST 13 09/22/2016 1141   ALT 10 09/22/2016 1141   ALKPHOS 91 09/22/2016 1141   BILITOT 0.5 09/22/2016 1141   GFRNONAA >60 04/15/2016 1026   GFRAA >60 04/15/2016 1026    Assessment: 1.  Esophageal dysphagia: Increased over the past couple of weeks, patient did have relief after last EGD in November with dilation, she has already been scheduled for repeat with Dr. Adela Lank 2.  Esophageal discomfort: With above, erythema on exam today, likely due to patient's coughing of phlegm  Plan: 1. Patient has already been scheduled for an EGD with Dr. Adela Lank on 10/19/16. Re-discussed risks, benefits, limitations and alternatives and the patient agrees to proceed. 2. Changed patient from Prevacid to Pantoprazole 40 mg twice a day, 30-60 minutes before eating. 3. Reviewed anti-dysphagia measures. Did provide her with a dysphagia diet handout and instructured her to follow dysphagia diet 2. 4. Prescribed GI cocktail 5-10 ML's every 4-6 hours as patient needs for esophageal discomfort, encouraged her to use this instead of her Mylanta as it may help more 5. Patient to return to clinic per Dr. Lanetta Inch recommendations after time of EGD.  Kim Meeker, PA-C  Gastroenterology 10/11/2016, 11:32 AM  Cc: Elizabeth Palau, FNP

## 2016-10-11 NOTE — Progress Notes (Signed)
Agree with assessment and plan as outlined. The patient has recurrent dysphagia. Empiric dilation done on last EGD and she found benefit. At that time, after dilation she had a mucosal wrent noted in the proximal esophagus, I suspect she had a mild stenosis there which could be causing her symptoms. Will await EGD with dilation, agree with management until that time.

## 2016-10-11 NOTE — Patient Instructions (Signed)
Stop Prevacid.  We have sent the following medications to your pharmacy for you to pick up at your convenience: Pantoprazole 40 mg twice a day.   GI cocktail.   We have given you a dysphagia diet handout, try to follow level 2.

## 2016-10-19 ENCOUNTER — Encounter: Payer: Self-pay | Admitting: Gastroenterology

## 2016-10-19 ENCOUNTER — Ambulatory Visit (AMBULATORY_SURGERY_CENTER): Payer: Medicare Other | Admitting: Gastroenterology

## 2016-10-19 VITALS — BP 121/50 | HR 81 | Temp 96.6°F | Resp 19 | Ht 64.0 in | Wt 223.0 lb

## 2016-10-19 DIAGNOSIS — R1310 Dysphagia, unspecified: Secondary | ICD-10-CM

## 2016-10-19 MED ORDER — SODIUM CHLORIDE 0.9 % IV SOLN: 500.0000 mL | INTRAVENOUS | Status: DC

## 2016-10-19 NOTE — Progress Notes (Signed)
Called to room to assist during endoscopic procedure.  Patient ID and intended procedure confirmed with present staff. Received instructions for my participation in the procedure from the performing physician.  

## 2016-10-19 NOTE — Patient Instructions (Addendum)
YOU HAD AN ENDOSCOPIC PROCEDURE TODAY AT THE [facility]:   Refer to the procedure report that was given to you for any specific questions about what was found during the examination.  If the procedure report does not answer your questions, please call your gastroenterologist to clarify.  If you requested that your care partner not be given the details of your procedure findings, then the procedure report has been included in a sealed envelope for you to review at your convenience later.  YOU SHOULD EXPECT: Some feelings of bloating in the abdomen. Passage of more gas than usual.  Walking can help get rid of the air that was put into your GI tract during the procedure and reduce the bloating. If you had a lower endoscopy (such as a colonoscopy or flexible sigmoidoscopy) you may notice spotting of blood in your stool or on the toilet paper. If you underwent a bowel prep for your procedure, you may not have a normal bowel movement for a few days.  Please Note:  You might notice some irritation and congestion in your nose or some drainage.  This is from the oxygen used during your procedure.  There is no need for concern and it should clear up in a day or so.  SYMPTOMS TO REPORT IMMEDIATELY:     Following upper endoscopy (EGD)  Vomiting of blood or coffee ground material  New chest pain or pain under the shoulder blades  Painful or persistently difficult swallowing  New shortness of breath  Fever of 100F or higher  Black, tarry-looking stools  For urgent or emergent issues, a gastroenterologist can be reached at any hour by calling [phone].   DIET:  Follow Dilation Handout.  ACTIVITY:  You should plan to take it easy for the rest of today and you should NOT DRIVE or use heavy machinery until tomorrow (because of the sedation medicines used during the test).    FOLLOW UP: Our staff will call the number listed on your records the next business day following your procedure  to check on you and address any questions or concerns that you may have regarding the information given to you following your procedure. If we do not reach you, we will leave a message.  However, if you are feeling well and you are not experiencing any problems, there is no need to return our call.  We will assume that you have returned to your regular daily activities without incident.  If any biopsies were taken you will be contacted by phone or by letter within the next 1-3 weeks.  Please call us at [phone] if you have not heard about the biopsies in 3 weeks.    SIGNATURES/CONFIDENTIALITY: You and/or your care partner have signed paperwork which will be entered into your electronic medical record.  These signatures attest to the fact that that the information above on your After Visit Summary has been reviewed and is understood.  Full responsibility of the confidentiality of this discharge information lies with you and/or your care-partner.  Resume medications.Information given on Dilation Diet. And stricture.

## 2016-10-19 NOTE — Op Note (Signed)
[facility] Patient Name: [name] Procedure Date: 10/19/2016 11:00 AM MRN: [id number] Endoscopist: Viviann Spare P. Armbruster MD, MD Age: 51 Referring MD:  Date of Birth: May 26, 1965 Gender: Female Account #: [id number] Procedure:                Upper GI endoscopy Indications:              Dysphagia, Globus sensation, history of empiric                            dilation with improvement in symptoms Medicines:                Monitored Anesthesia Care Procedure:                Pre-Anesthesia Assessment:                           - Prior to the procedure, a History and Physical                            was performed, and patient medications and                            allergies were reviewed. The patient's tolerance of                            previous anesthesia was also reviewed. The risks                            and benefits of the procedure and the sedation                            options and risks were discussed with the patient.                            All questions were answered, and informed consent                            was obtained. Prior Anticoagulants: The patient has                            taken aspirin, last dose was 1 day prior to                            procedure. ASA Grade Assessment: III - A patient                            with severe systemic disease. After reviewing the                            risks and benefits, the patient was deemed in                            satisfactory condition to undergo the procedure.  After obtaining informed consent, the endoscope was                            passed under direct vision. Throughout the                            procedure, the patient's blood pressure, pulse, and                            oxygen saturations were monitored continuously. The                            Endoscope was introduced through the mouth, and                            advanced  to the second part of duodenum. The upper                            GI endoscopy was accomplished without difficulty.                            The patient tolerated the procedure well. Scope In: Scope Out: Findings:                 Esophagogastric landmarks were identified: the                            Z-line was found at 34 cm, the gastroesophageal                            junction was found at 34 cm and the upper extent of                            the gastric folds was found at 36 cm from the                            incisors.                           A 2 cm hiatal hernia was present.                           The exam of the esophagus was otherwise normal. No                            obvious stenosis appreciated.                           A guidewire was placed and the scope was withdrawn.                            Empiric dilation was performed in the entire  esophagus with a Savary 18mm dilator with mild                            resistance. Relook endoscopy post dilation showed a                            moderate mucosal wrent at 17cm from the incisors,                            site of subtle stenosis.                           The entire examined stomach was normal.                           The duodenal bulb and second portion of the                            duodenum were normal. Complications:            No immediate complications. Estimated blood loss:                            Minimal. Estimated Blood Loss:     Estimated blood loss was minimal. Impression:               - Esophagogastric landmarks identified.                           - 2 cm hiatal hernia.                           - Empiric dilation with 18mm Savory, with mucosal                            wrent at 17cm from the incisors, suspect subtle                            stenosis there is causing symptoms                           - Normal stomach.                            - Normal duodenal bulb and second portion of the                            duodenum. Recommendation:           - Patient has a contact number available for                            emergencies. The signs and symptoms of potential                            delayed complications were discussed with the  patient. Return to normal activities tomorrow.                            Written discharge instructions were provided to the                            patient.                           - Resume previous diet.                           - Continue present medications.                           - Await course post-dilation                           - Continue domperidone, working well for                            gastroparesis                           - Repeat upper endoscopy PRN for retreatment. Viviann SpareSteven P. Armbruster MD, MD 10/19/2016 11:42:32 AM This report has been signed electronically.

## 2016-10-19 NOTE — Progress Notes (Signed)
To PACU VSS Report to RN 

## 2016-10-20 ENCOUNTER — Telehealth: Payer: Self-pay | Admitting: *Deleted

## 2016-10-20 ENCOUNTER — Telehealth: Payer: Self-pay

## 2016-10-20 NOTE — Telephone Encounter (Signed)
  Follow up Call-  Call back number 10/19/2016 04/19/2016  Post procedure Call Back phone  # [phone]  Permission to leave phone message Yes Yes  Some recent data might be hidden     Left message

## 2016-10-20 NOTE — Telephone Encounter (Signed)
  Follow up Call-  Call back number 10/19/2016 04/19/2016  Post procedure Call Back phone  # [phone]  Permission to leave phone message Yes Yes  Some recent data might be hidden     Patient questions:  Do you have a fever, pain , or abdominal swelling? Yes.   Pain Score  3 *  Have you tolerated food without any problems? Yes.    Have you been able to return to your normal activities? Yes.    Do you have any questions about your discharge instructions: Diet   No. Medications  No. Follow up visit  No.  Do you have questions or concerns about your Care? No.  Actions: * If pain score is 4 or above: No action needed, pain <4. Patient co of "aching" upper throat. 3/10. No problems with swallowing or eating. No pain in chest or back. No fever or bleeding.Suggested warm saline gargle. Patient agreed to call back with any worsening.

## 2016-10-24 ENCOUNTER — Other Ambulatory Visit: Payer: Self-pay

## 2016-10-24 ENCOUNTER — Telehealth: Payer: Self-pay | Admitting: Gastroenterology

## 2016-10-24 MED ORDER — PANTOPRAZOLE SODIUM 20 MG PO TBEC: 20.0000 mg | DELAYED_RELEASE_TABLET | Freq: Two times a day (BID) | ORAL | 3 refills | Status: DC

## 2016-10-24 NOTE — Telephone Encounter (Signed)
protonix 20mg  BID #60 with 3 refills sent.

## 2016-12-25 ENCOUNTER — Telehealth: Payer: Self-pay | Admitting: Gastroenterology

## 2016-12-25 ENCOUNTER — Other Ambulatory Visit: Payer: Self-pay

## 2016-12-25 DIAGNOSIS — R140 Abdominal distension (gaseous): Secondary | ICD-10-CM

## 2016-12-25 MED ORDER — HYOSCYAMINE SULFATE 0.125 MG SL SUBL: 0.1250 mg | SUBLINGUAL_TABLET | Freq: Three times a day (TID) | SUBLINGUAL | 1 refills | Status: DC | PRN

## 2016-12-25 NOTE — Telephone Encounter (Signed)
Yes that's fine, thanks

## 2016-12-25 NOTE — Telephone Encounter (Signed)
Refill sent.

## 2016-12-25 NOTE — Telephone Encounter (Signed)
Are you ok to refill Hyoscyamine?

## 2016-12-28 NOTE — Telephone Encounter (Signed)
Patient states that her insurance is requiring a prior auth for this.

## 2016-12-29 NOTE — Telephone Encounter (Signed)
Paperwork faxed today for approval. Pt states that she has been taking Hyoscyamine for years for gastroparesis. Pt informed.

## 2016-12-29 NOTE — Telephone Encounter (Signed)
I have started the paperwork. I have not been able to complet it. I need to call pt.

## 2017-01-01 NOTE — Telephone Encounter (Signed)
Received approval today for Hyoscyamine sublingual. It is approved until 12/29/17. Pt informed. Faxed pts [facility] approval letter.

## 2017-01-10 ENCOUNTER — Telehealth: Payer: Self-pay | Admitting: Gastroenterology

## 2017-01-10 NOTE — Telephone Encounter (Signed)
Protonix does not come in a liquid form. We can try her on Prevacid Solutab 30mg  once daily - not sure how much this will be for her, but it is dissolvable and may be better tolerated. Thanks

## 2017-01-10 NOTE — Telephone Encounter (Signed)
Dr. Adela Lank, Are you ok with sending liquid form? If so, what are your instructions?

## 2017-01-11 ENCOUNTER — Other Ambulatory Visit: Payer: Self-pay

## 2017-01-11 MED ORDER — LANSOPRAZOLE 30 MG PO TBDP: 30.0000 mg | ORAL_TABLET | Freq: Every day | ORAL | 3 refills | Status: DC

## 2017-01-11 NOTE — Telephone Encounter (Signed)
Prevacid solutab sent to [facility] in [place]. Informed pt on voicemail. Instructed pt to call back if this medication was too expensive or was not helpful.

## 2017-01-15 NOTE — Telephone Encounter (Signed)
Pt said her insurance needs a PA for her Prevacid

## 2017-01-16 NOTE — Telephone Encounter (Signed)
Per chart review, patient has tried zegerid, Nexium, Pepcid 40 mg, Zantac 150 mg, Pantoprazole 40 mg and 20 mg in the past and has failed each of these. The only medication that has been effective in the past has been the Prevacid Solutab.   Phone number for patient's insurance is [phone]. Member ID: [id number]. Patient Diagnosis: Esophageal stenosis

## 2017-01-16 NOTE — Telephone Encounter (Signed)
Patient also has tried omeprazole. Prior Auth phone [phone].

## 2017-01-16 NOTE — Telephone Encounter (Signed)
Patient' Prevacid Solutab has been approved from 10/18/16-01/16/18. Approval: [id number]. Spoke with Misty Stanley.

## 2017-02-06 ENCOUNTER — Other Ambulatory Visit: Payer: Self-pay | Admitting: Gastroenterology

## 2017-02-06 DIAGNOSIS — R140 Abdominal distension (gaseous): Secondary | ICD-10-CM

## 2017-02-07 NOTE — Telephone Encounter (Signed)
Is it OK to refill Levsin? We gave her 60 last month with 1 refill. If so, would you like to give her more refills than 1? Thank you.

## 2017-02-08 NOTE — Telephone Encounter (Signed)
Yes you can refill, give her 90 tablets with 3 refills. thanks

## 2017-02-17 ENCOUNTER — Other Ambulatory Visit: Payer: Self-pay | Admitting: Gastroenterology

## 2017-02-28 ENCOUNTER — Ambulatory Visit (INDEPENDENT_AMBULATORY_CARE_PROVIDER_SITE_OTHER): Payer: Medicare Other | Admitting: Ophthalmology

## 2017-02-28 DIAGNOSIS — H43813 Vitreous degeneration, bilateral: Secondary | ICD-10-CM | POA: Diagnosis not present

## 2017-02-28 DIAGNOSIS — E10319 Type 1 diabetes mellitus with unspecified diabetic retinopathy without macular edema: Secondary | ICD-10-CM | POA: Diagnosis not present

## 2017-02-28 DIAGNOSIS — E103592 Type 1 diabetes mellitus with proliferative diabetic retinopathy without macular edema, left eye: Secondary | ICD-10-CM

## 2017-02-28 DIAGNOSIS — H35033 Hypertensive retinopathy, bilateral: Secondary | ICD-10-CM | POA: Diagnosis not present

## 2017-02-28 DIAGNOSIS — H353112 Nonexudative age-related macular degeneration, right eye, intermediate dry stage: Secondary | ICD-10-CM

## 2017-02-28 DIAGNOSIS — E103391 Type 1 diabetes mellitus with moderate nonproliferative diabetic retinopathy without macular edema, right eye: Secondary | ICD-10-CM | POA: Diagnosis not present

## 2017-02-28 DIAGNOSIS — H2513 Age-related nuclear cataract, bilateral: Secondary | ICD-10-CM | POA: Diagnosis not present

## 2017-02-28 DIAGNOSIS — I10 Essential (primary) hypertension: Secondary | ICD-10-CM | POA: Diagnosis not present

## 2017-03-12 ENCOUNTER — Institutional Professional Consult (permissible substitution): Payer: Medicare Other | Admitting: Pulmonary Disease

## 2017-03-19 ENCOUNTER — Ambulatory Visit (INDEPENDENT_AMBULATORY_CARE_PROVIDER_SITE_OTHER)
Admission: RE | Admit: 2017-03-19 | Discharge: 2017-03-19 | Disposition: A | Discharge status: Home / Self Care | Payer: Medicare Other | Source: Ambulatory Visit | Attending: Pulmonary Disease | Admitting: Pulmonary Disease

## 2017-03-19 ENCOUNTER — Ambulatory Visit (INDEPENDENT_AMBULATORY_CARE_PROVIDER_SITE_OTHER): Payer: Medicare Other | Admitting: Pulmonary Disease

## 2017-03-19 ENCOUNTER — Other Ambulatory Visit: Payer: Medicare Other

## 2017-03-19 ENCOUNTER — Encounter: Payer: Self-pay | Admitting: Pulmonary Disease

## 2017-03-19 VITALS — BP 138/78 | HR 69 | Ht 64.0 in | Wt 231.0 lb

## 2017-03-19 DIAGNOSIS — K219 Gastro-esophageal reflux disease without esophagitis: Secondary | ICD-10-CM

## 2017-03-19 DIAGNOSIS — J4550 Severe persistent asthma, uncomplicated: Secondary | ICD-10-CM

## 2017-03-19 DIAGNOSIS — I25768 Atherosclerosis of bypass graft of coronary artery of transplanted heart with other forms of angina pectoris: Secondary | ICD-10-CM

## 2017-03-19 DIAGNOSIS — J302 Other seasonal allergic rhinitis: Secondary | ICD-10-CM | POA: Diagnosis not present

## 2017-03-19 MED ORDER — METHYLPREDNISOLONE ACETATE 80 MG/ML IJ SUSP
80.0000 mg | Freq: Once | INTRAMUSCULAR | Status: AC
  Administered 2017-03-19: 80 mg via INTRAMUSCULAR

## 2017-03-19 MED ORDER — SPACER/AERO CHAMBER MOUTHPIECE MISC: 1.0000 | 0 refills | Status: DC

## 2017-03-19 MED ORDER — MONTELUKAST SODIUM 5 MG PO CHEW: 10.0000 mg | CHEWABLE_TABLET | Freq: Every day | ORAL | 3 refills | Status: DC

## 2017-03-19 MED ORDER — BUDESONIDE-FORMOTEROL FUMARATE 160-4.5 MCG/ACT IN AERO: 2.0000 | INHALATION_SPRAY | Freq: Two times a day (BID) | RESPIRATORY_TRACT | 0 refills | Status: DC

## 2017-03-19 MED ORDER — BUDESONIDE-FORMOTEROL FUMARATE 160-4.5 MCG/ACT IN AERO: 2.0000 | INHALATION_SPRAY | Freq: Two times a day (BID) | RESPIRATORY_TRACT | 3 refills | Status: DC

## 2017-03-19 NOTE — Addendum Note (Signed)
Addended by: Etheleen MayhewOX, HEATHER C on: 03/19/2017 02:09 PM   Modules accepted: Orders

## 2017-03-19 NOTE — Progress Notes (Signed)
Subjective:    Patient ID: [name], female    DOB: 1966/02/22, 51 y.o.   MRN: [id number]  HPI She reports she was diagnosed at birth with asthma. She has had trouble breathing with exertion as a child. She reports it seems to be progressively worsening. She denies any dyspnea at rest. She does report intermittent coughing & wheezing with her dyspnea. She reports she has had increased coughing over the last 3 weeks. She was treated with a course of antibiotics. She reports she does have chest tightness and "rattling" as well. She reports her cough is producing a "green & yellow" mucus lately. She reports she has noticed for the last 8 months she has noticed increased dyspnea upon awakenings. She does wake up at nights wheezing at times. She has been using her Advair daily for the last 8 months. She has been using her nebulizer and rescue inhaler more frequently over the last 8 months as well. She reports nearly constant reflux. She reports she takes Prevacid daily with a solu-tab. She denies any dysphagia or odynophagia. She has had esophageal dilation 4 times and the last time was in June. She denies any morning brash water taste. She reports sinus congestion & pressure lately as well with sinus drainage. She reports she did previously have a "low grade" fever but no chills or sweats. Has had pneumonia once in the 1990s. She reports she gets bronchitis 2-3 times yearly.   Review of Systems No rashes or bruising. No dysuria or hematuria. A pertinent 14 point review of systems is negative except as per the history of presenting illness.  Allergies  Allergen Reactions  . Metformin Anaphylaxis  . Penicillins Anaphylaxis and Other (See Comments)    Has patient had a PCN reaction causing immediate rash, facial/tongue/throat swelling, SOB or lightheadedness with hypotension: Yes Has patient had a PCN reaction causing severe rash involving mucus membranes or skin necrosis: No Has patient had a PCN  reaction that required hospitalization No Has patient had a PCN reaction occurring within the last 10 years: No If all of the above answers are "NO", then may proceed with Cephalosporin use.  . Metoclopramide Other (See Comments)    Reaction:  Nightmares     Current Outpatient Prescriptions on File Prior to Visit  Medication Sig Dispense Refill  . albuterol (PROVENTIL) (2.5 MG/3ML) 0.083% nebulizer solution Take 2.5 mg by nebulization every 4 (four) hours as needed for wheezing or shortness of breath.     Marland Kitchen. albuterol (VENTOLIN HFA) 108 (90 BASE) MCG/ACT inhaler Inhale 2 puffs into the lungs every 6 (six) hours as needed for wheezing or shortness of breath.     . ALPRAZolam (XANAX) 0.5 MG tablet Take 0.5 mg by mouth daily.     . AMBULATORY NON FORMULARY MEDICATION Domperidone 10mg  Sig: Take one tablet by mouth three times daily (Patient taking differently: Take 10 mg by mouth 3 (three) times daily. Domperidone 10mg ) 180 tablet 1  . aspirin 81 MG chewable tablet Chew 81 mg by mouth daily.    . fish oil-omega-3 fatty acids 1000 MG capsule Take 2 capsules (2 g total) by mouth 2 (two) times daily.    . Fluticasone-Salmeterol (ADVAIR) 250-50 MCG/DOSE AEPB Inhale 1 puff into the lungs 2 (two) times daily.    . furosemide (LASIX) 40 MG tablet TAKE 1 TABLET (40 MG TOTAL) BY MOUTH DAILY. 30 tablet 6  . glucose blood (CONTOUR NEXT TEST) test strip TEST BLOOD GLUCOSE EIGHT TIMES DAILY    .  hyoscyamine (LEVSIN SL) 0.125 MG SL tablet PLACE 1 TABLET (0.125 MG TOTAL) UNDER THE TONGUE EVERY 8 (EIGHT) HOURS AS NEEDED. 90 tablet 3  . Insulin Human (INSULIN PUMP) SOLN Pt uses up to 150 units of NOVOLOG daily.    Marland Kitchen levothyroxine (SYNTHROID, LEVOTHROID) 100 MCG tablet Take 100 mcg by mouth daily before breakfast.    . metoprolol tartrate (LOPRESSOR) 25 MG tablet TAKE 0.5 TABLETS (12.5 MG TOTAL) BY MOUTH 2 (TWO) TIMES DAILY. (Patient taking differently: Take 0.5 tablet by mouth once daily) 30 tablet 6  .  nitroGLYCERIN (NITROSTAT) 0.4 MG SL tablet Place 0.4 mg under the tongue every 5 (five) minutes as needed for chest pain.    Marland Kitchen PREVACID SOLUTAB 30 MG disintegrating tablet TAKE 1 TABLET THIRTY MINUTES BEFORE BREAKFAST AND THIRTY MINUTES BEFORE SUPPER 60 tablet 3  . Pseudoeph-Doxylamine-DM-APAP (NYQUIL PO) Take 30 mLs by mouth at bedtime as needed (for cough).    . rosuvastatin (CRESTOR) 20 MG tablet TAKE 1 TABLET (20 MG TOTAL) BY MOUTH DAILY. 90 tablet 3  . tiZANidine (ZANAFLEX) 4 MG tablet Take 2-4 mg by mouth at bedtime as needed for muscle spasms.     Current Facility-Administered Medications on File Prior to Visit  Medication Dose Route Frequency Provider Last Rate Last Dose  . 0.9 %  sodium chloride infusion  500 mL Intravenous Continuous Armbruster, Willaim Rayas, MD      . 0.9 %  sodium chloride infusion  500 mL Intravenous Continuous Armbruster, Willaim Rayas, MD        Past Medical History:  Diagnosis Date  . Allergic rhinitis   . Allergy   . Anxiety   . Asthma   . Blood transfusion without reported diagnosis    2010 CABG  . CAD (coronary artery disease)    4v CABG, 9/10; NL LVF, status post followup Cardiolite November 2012 no ischemia ejection fraction 65%  . Cataract    are forming  . Diabetes mellitus    Scheduled to go on insulin pump  . Dyslipidemia     LDL 22 mg percent on Crestor  . Ejection fraction   . Esophageal stricture   . Gastroparesis due to DM (HCC)   . GERD (gastroesophageal reflux disease)   . Glaucoma   . History of heart attack   . Hx of CABG    September, 2010  . Hyperlipidemia   . Hypertension   . Hypothyroidism   . Myocardial infarction (HCC) 2010   2008 x 2 stenst, 209 x 2 stents. 01-2009 CABG x 4     Past Surgical History:  Procedure Laterality Date  . ANGIOPLASTY     with stenting 1610,9604  . CARDIAC SURGERY    . CESAREAN SECTION    . CORONARY ARTERY BYPASS GRAFT  01/26/2009  . EYE SURGERY    . open heart surgery   2010  . TUBAL LIGATION        Family History  Problem Relation Age of Onset  . Heart disease Father   . Asthma Father   . COPD Father   . Diabetes Mother   . Asthma Paternal Aunt   . Colon cancer Neg Hx   . Colon polyps Neg Hx   . Esophageal cancer Neg Hx   . Rectal cancer Neg Hx   . Stomach cancer Neg Hx     Social History   Social History  . Marital status: Single    Spouse name: N/A  . Number of children: 1  .  Years of education: N/A   Occupational History  . disabilied    Social History Main Topics  . Smoking status: Former Smoker    Packs/day: 1.00    Years: 3.00    Types: Cigarettes    Start date: 03/14/1991    Quit date: 05/22/1993  . Smokeless tobacco: Never Used     Comment:  Year Quit: 1995  . Alcohol use No  . Drug use: No  . Sexual activity: Yes   Other Topics Concern  . None   Social History Narrative   [facility] (03/19/17):   Originally from [place], [place]. Has always lived in [place]. Previously worked at [employer]. She is disabled due to her prior cardiac history. 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 No pets currently. No bird exposure. She reports she did have mold in a prior home that she lived in for 3 years. She has since moved to a new home. She enjoys walking.       Objective:   Physical Exam BP 138/78 (BP Location: Left Arm, Cuff Size: Normal)   Pulse 69   Ht 5\' 4"  (1.626 m)   Wt 231 lb (104.8 kg)   SpO2 97%   BMI 39.65 kg/m  General:  Awake. Alert. No acute distress. Central obesity.  Integument:  Warm & dry. No rash on exposed skin. Dry skin on the palms with some dryness around her knuckles as well. Epidermal cracking appears mild. Extremities:  No cyanosis or clubbing.  Lymphatics:  No appreciated cervical or supraclavicular lymphadenoapthy. HEENT:  Moist mucus membranes. No oral ulcers. Prominent left nasal turbinate swelling compared with the right. Poor dentition. Cardiovascular:  Regular rate.  No edema. Regular rhythm.  Pulmonary:  Diffuse bilateral wheezing. Good aeration bilaterally. Normal work of breathing on room air. Abdomen: Soft. Normal bowel sounds. Nondistended. Grossly nontender. Musculoskeletal:  Normal bulk and tone. Hand grip strength 5/5 bilaterally. No joint deformity or effusion appreciated. Neurological:  CN 2-12 grossly in tact. No meningismus. Moving all 4 extremities equally. Symmetric brachioradialis deep tendon reflexes. Psychiatric:  Mood and affect congruent. Speech normal rhythm, rate & tone.   IMAGING CXR PA/LAT 10/01/16 (personally reviewed by me):  Rierson sternotomy wires noted. Some kyphosis on lateral view. No parenchymal mass or opacity appreciated. Low lung volumes. No pleural effusion. No cardiomegaly. Mediastinum normal in contour.  CARDIAC TTE (04/14/16):  LV normal in size with mild concentric hypertrophy. EF 55-60% with no regional wall motion abnormalities. Normal LV diastolic function. LA mildly dilated & RA normal in size. RV normal in size and function. No aortic stenosis or regurgitation. No mitral stenosis or regurgitation. Trivial pulmonic regurgitation. Mild tricuspid regurgitation. No pericardial effusion.    Assessment & Plan:  51 y.o. female with long-standing history of dyspnea that has been labeled as asthma. Certainly this would be consistent with severe, persistent asthma. I reviewed her prior chest x-ray from May which demonstrates no parenchymal cause that would explain her symptoms for the last 8 months. Certainly any uncontrolled reflux could be contributing to her symptoms as well. I'm going to attempt to better control her asthma and allergies before we blame her symptoms totally on her underlying reflux. I instructed the patient to contact my office if she had any new breathing problems or questions before her next appointment.  1. Severe, persistent asthma: Administering Depo-Medrol 80 mg IM 1 today. Switching from Advair to  Symbicort 160/4.5 with spacer. Starting patient on  Singulair 10 mg by mouth daily. Checking sputum culture for AFB, fungus, and bacteria. Also checking serum CBC with differential & RAST panel. Checking full on a function testing and 6 minute walk test. 2. GERD: Continuing on Prevacid as previously prescribed. Following with GI. 3. Chronic seasonal allergic rhinitis: Checking CBC with differential & RAST panel. Starting Singulair 10 mg by mouth daily at bedtime. 4. Health maintenance: Plan to address at next appointment. 5. Follow-up: Return to clinic in 3 weeks or sooner if needed.  Donna Christen Jamison Neighbor, M.D. [facility] Pager:  [phone] After 7pm or if no response, call [phone] 2:00 PM 03/19/17

## 2017-03-19 NOTE — Patient Instructions (Addendum)
   Call or e-mail if you have any questions or concerns.  I'm switching you from Advair to Symbicort. Use the sample we are giving you today by doing 2 puffs every 12 hours.  Remember to remove any dentures or partials you have before you use your inhaler. Remember to brush your teeth & tongue after you use your inhaler as well as rinse, gargle & spit to keep from getting thrush in your mouth or on your tongue (a white film).   The sputum specimen should not be refrigerated and should be dropped off to [facility] within 4 hours. Early morning specimens are the best.   TESTS ORDERED: 1. Sputum culture for AFB, Fungus, & Routine Bacteria - [place] 2. Serum CBC with differential & RAST Panel 3. CXR PA/LAT today  4. Full PFTs on or before next appointment 5. 6MWT on or before next appointment

## 2017-03-20 LAB — RESPIRATORY ALLERGY PROFILE REGION II ~~LOC~~
Allergen, Cedar tree, t12: <0.1 kU/L
Allergen, Cottonwood, t14: <0.1 kU/L
Allergen, Mouse Urine Protein, e78: <0.1 kU/L
Allergen, Mulberry, t76: <0.1 kU/L
Allergen, Oak,t7: <0.1 kU/L
Aspergillus fumigatus, m3: <0.1 kU/L
CLADOSPORIUM HERBARUM (M2) IGE: <0.1 kU/L
CLASS: 0
CLASS: 0
CLASS: 0
CLASS: 0
CLASS: 0
CLASS: 0
CLASS: 0
CLASS: 0
CLASS: 0
CLASS: 0
CLASS: 0
CLASS: 0
CLASS: 0
CLASS: 0
Cat Dander: 0.12 kU/L — ABNORMAL HIGH
Class: 0
Class: 0
Class: 0
Class: 0
Class: 0
Class: 0
Class: 0
Class: 0
Class: 0
Class: 0
Cockroach: <0.1 kU/L
Dog Dander: 0.12 kU/L — ABNORMAL HIGH
IGE (IMMUNOGLOBULIN E), SERUM: 33 kU/L (ref <=114)
Rough Pigweed  IgE: <0.1 kU/L
Timothy Grass: <0.1 kU/L

## 2017-03-20 LAB — CBC WITH DIFFERENTIAL
BASOS ABS: 0.1 10*3/uL (ref 0.0–0.2)
BASOS: 0 %
EOS (ABSOLUTE): 0.5 10*3/uL — AB (ref 0.0–0.4)
Eos: 4 %
Hematocrit: 39.6 % (ref 34.0–46.6)
Hemoglobin: 12.9 g/dL (ref 11.1–15.9)
IMMATURE GRANS (ABS): 0 10*3/uL (ref 0.0–0.1)
IMMATURE GRANULOCYTES: 0 %
LYMPHS: 36 %
Lymphocytes Absolute: 4.8 10*3/uL — ABNORMAL HIGH (ref 0.7–3.1)
MCH: 27.3 pg (ref 26.6–33.0)
MCHC: 32.6 g/dL (ref 31.5–35.7)
MCV: 84 fL (ref 79–97)
MONOCYTES: 5 %
Monocytes Absolute: 0.6 10*3/uL (ref 0.1–0.9)
NEUTROS PCT: 55 %
Neutrophils Absolute: 7.3 10*3/uL — ABNORMAL HIGH (ref 1.4–7.0)
RBC: 4.73 x10E6/uL (ref 3.77–5.28)
RDW: 16.2 % — AB (ref 12.3–15.4)
WBC: 13.3 10*3/uL — AB (ref 3.4–10.8)

## 2017-03-20 LAB — INTERPRETATION:

## 2017-03-22 ENCOUNTER — Other Ambulatory Visit: Payer: Self-pay | Admitting: *Deleted

## 2017-03-22 MED ORDER — METOPROLOL TARTRATE 25 MG PO TABS: 12.5000 mg | ORAL_TABLET | Freq: Every day | ORAL | 3 refills | Status: DC

## 2017-03-26 ENCOUNTER — Telehealth: Payer: Self-pay | Admitting: Pulmonary Disease

## 2017-03-26 NOTE — Telephone Encounter (Signed)
Spoke with patient. She is aware of results.   Nothing else needed at time of call.  

## 2017-03-26 NOTE — Telephone Encounter (Signed)
She has only some weak allergies to dogs & cats from her blood work. Her chest x-ray didn't show anything acute when compared with her prior one.

## 2017-03-26 NOTE — Telephone Encounter (Signed)
Spoke with patient. She is requesting results from her lab work and CXR from 10/29. Advised patient that I could see the results but that JN had not received them yet. Patient verbalized understanding.    JN, please advise on her results. Thanks!

## 2017-03-29 ENCOUNTER — Other Ambulatory Visit: Payer: Self-pay | Admitting: Cardiovascular Disease

## 2017-03-29 NOTE — Telephone Encounter (Signed)
Patient informed that our docs here do not typically fill this type of medication.  She should call the doctor that initially prescribed for her.  Patient verbalized understanding.

## 2017-03-29 NOTE — Telephone Encounter (Signed)
° ° ° °  1. Which medications need to be refilled? (please list name of each medication and dose if known)  tiZANidine (ZANAFLEX) 4 MG tablet    2. Which pharmacy/location (including street and city if local pharmacy) is medication to be sent to? [facility] [place] [place]   3. Do they need a 30 day or 90 day supply?

## 2017-04-09 ENCOUNTER — Other Ambulatory Visit: Payer: Self-pay

## 2017-04-09 ENCOUNTER — Emergency Department (HOSPITAL_COMMUNITY): Payer: Medicare Other

## 2017-04-09 ENCOUNTER — Encounter (HOSPITAL_COMMUNITY): Payer: Self-pay | Admitting: Cardiology

## 2017-04-09 ENCOUNTER — Emergency Department (HOSPITAL_COMMUNITY)
Admission: EM | Admit: 2017-04-09 | Discharge: 2017-04-09 | Disposition: A | Discharge status: Home / Self Care | Payer: Medicare Other | Attending: Emergency Medicine | Admitting: Emergency Medicine

## 2017-04-09 DIAGNOSIS — I2510 Atherosclerotic heart disease of native coronary artery without angina pectoris: Secondary | ICD-10-CM | POA: Insufficient documentation

## 2017-04-09 DIAGNOSIS — Z79899 Other long term (current) drug therapy: Secondary | ICD-10-CM | POA: Diagnosis not present

## 2017-04-09 DIAGNOSIS — I10 Essential (primary) hypertension: Secondary | ICD-10-CM | POA: Insufficient documentation

## 2017-04-09 DIAGNOSIS — R070 Pain in throat: Secondary | ICD-10-CM | POA: Insufficient documentation

## 2017-04-09 DIAGNOSIS — R0989 Other specified symptoms and signs involving the circulatory and respiratory systems: Secondary | ICD-10-CM

## 2017-04-09 DIAGNOSIS — E785 Hyperlipidemia, unspecified: Secondary | ICD-10-CM | POA: Insufficient documentation

## 2017-04-09 DIAGNOSIS — Z87891 Personal history of nicotine dependence: Secondary | ICD-10-CM | POA: Insufficient documentation

## 2017-04-09 DIAGNOSIS — Z7982 Long term (current) use of aspirin: Secondary | ICD-10-CM | POA: Insufficient documentation

## 2017-04-09 DIAGNOSIS — E119 Type 2 diabetes mellitus without complications: Secondary | ICD-10-CM | POA: Insufficient documentation

## 2017-04-09 DIAGNOSIS — J4541 Moderate persistent asthma with (acute) exacerbation: Secondary | ICD-10-CM | POA: Diagnosis not present

## 2017-04-09 DIAGNOSIS — R0602 Shortness of breath: Secondary | ICD-10-CM | POA: Diagnosis present

## 2017-04-09 DIAGNOSIS — Z951 Presence of aortocoronary bypass graft: Secondary | ICD-10-CM | POA: Diagnosis not present

## 2017-04-09 DIAGNOSIS — E039 Hypothyroidism, unspecified: Secondary | ICD-10-CM | POA: Insufficient documentation

## 2017-04-09 DIAGNOSIS — Z794 Long term (current) use of insulin: Secondary | ICD-10-CM | POA: Diagnosis not present

## 2017-04-09 DIAGNOSIS — R6889 Other general symptoms and signs: Secondary | ICD-10-CM

## 2017-04-09 LAB — BASIC METABOLIC PANEL
ANION GAP: 7 (ref 5–15)
BUN: 7 mg/dL (ref 6–20)
CHLORIDE: 103 mmol/L (ref 101–111)
CO2: 28 mmol/L (ref 22–32)
Calcium: 9.2 mg/dL (ref 8.9–10.3)
Creatinine, Ser: 0.7 mg/dL (ref 0.44–1.00)
Glucose, Bld: 155 mg/dL — ABNORMAL HIGH (ref 65–99)
POTASSIUM: 3.6 mmol/L (ref 3.5–5.1)
SODIUM: 138 mmol/L (ref 135–145)

## 2017-04-09 LAB — CBC
HEMATOCRIT: 38.8 % (ref 36.0–46.0)
HEMOGLOBIN: 12.1 g/dL (ref 12.0–15.0)
MCH: 27 pg (ref 26.0–34.0)
MCHC: 31.2 g/dL (ref 30.0–36.0)
MCV: 86.6 fL (ref 78.0–100.0)
Platelets: 311 10*3/uL (ref 150–400)
RBC: 4.48 MIL/uL (ref 3.87–5.11)
RDW: 15.9 % — AB (ref 11.5–15.5)
WBC: 11.6 10*3/uL — AB (ref 4.0–10.5)

## 2017-04-09 LAB — TROPONIN I: Troponin I: <0.03 ng/mL (ref <0.03)

## 2017-04-09 MED ORDER — SODIUM CHLORIDE 0.9 % IV SOLN
INTRAVENOUS | Status: DC
  Administered 2017-04-09: 15:00:00 via INTRAVENOUS

## 2017-04-09 MED ORDER — PREDNISONE 10 MG PO TABS: 40.0000 mg | ORAL_TABLET | Freq: Every day | ORAL | 0 refills | Status: DC

## 2017-04-09 MED ORDER — METHYLPREDNISOLONE SODIUM SUCC 125 MG IJ SOLR
125.0000 mg | Freq: Once | INTRAMUSCULAR | Status: AC
  Administered 2017-04-09: 125 mg via INTRAVENOUS
  Filled 2017-04-09: qty 2

## 2017-04-09 MED ORDER — FAMOTIDINE IN NACL 20-0.9 MG/50ML-% IV SOLN
20.0000 mg | Freq: Once | INTRAVENOUS | Status: AC
  Administered 2017-04-09: 20 mg via INTRAVENOUS
  Filled 2017-04-09: qty 50

## 2017-04-09 MED ORDER — IOPAMIDOL (ISOVUE-300) INJECTION 61%
75.0000 mL | Freq: Once | INTRAVENOUS | Status: AC | PRN
  Administered 2017-04-09: 75 mL via INTRAVENOUS

## 2017-04-09 MED ORDER — SODIUM CHLORIDE 0.9 % IV BOLUS (SEPSIS)
500.0000 mL | Freq: Once | INTRAVENOUS | Status: AC
  Administered 2017-04-09: 500 mL via INTRAVENOUS

## 2017-04-09 MED ORDER — IPRATROPIUM-ALBUTEROL 0.5-2.5 (3) MG/3ML IN SOLN
3.0000 mL | Freq: Once | RESPIRATORY_TRACT | Status: AC
  Administered 2017-04-09: 3 mL via RESPIRATORY_TRACT
  Filled 2017-04-09: qty 3

## 2017-04-09 NOTE — ED Triage Notes (Signed)
C/o feeling like her throat is tight and closing up.  C/o feeling "warm and tingly"  All over.

## 2017-04-09 NOTE — ED Notes (Signed)
RT paged for Duo neb

## 2017-04-09 NOTE — Discharge Instructions (Signed)
Use your albuterol inhaler or every 6 hours for the next 7 days.  Take the prednisone as directed.  Make an appointment to follow-up with your doctor.  Return for any new or worse symptoms.

## 2017-04-09 NOTE — ED Provider Notes (Signed)
[facility] EMERGENCY DEPARTMENT Provider Note   CSN: [id number] Arrival date & time: 04/09/17  5621     History   Chief Complaint Chief Complaint  Patient presents with  . Shortness of Breath    HPI [name] is a 51 y.o. female.  Patient comes in with a complaint is feeling as if there is a tightness in the lower part of her throat.  Patient also feels as if she has wheezing.  She has a history of asthma.  Patient has albuterol inhaler and nebulizer available patient not currently on steroids.  Patient without any hives.  No tongue swelling.  No lip swelling.       Past Medical History:  Diagnosis Date  . Allergic rhinitis   . Allergy   . Anxiety   . Asthma   . Blood transfusion without reported diagnosis    2010 CABG  . CAD (coronary artery disease)    4v CABG, 9/10; NL LVF, status post followup Cardiolite November 2012 no ischemia ejection fraction 65%  . Cataract    are forming  . Diabetes mellitus    Scheduled to go on insulin pump  . Dyslipidemia     LDL 22 mg percent on Crestor  . Ejection fraction   . Esophageal stricture   . Gastroparesis due to DM (HCC)   . GERD (gastroesophageal reflux disease)   . Glaucoma   . History of heart attack   . Hx of CABG    September, 2010  . Hyperlipidemia   . Hypertension   . Hypothyroidism   . Myocardial infarction (HCC) 2010   2008 x 2 stenst, 209 x 2 stents. 01-2009 CABG x 4     Patient Active Problem List   Diagnosis Date Noted  . Severe persistent asthma 03/19/2017  . Chronic seasonal allergic rhinitis 03/19/2017  . Diabetes mellitus with complication (HCC)   . Atypical chest pain   . Muscle spasm 08/20/2014  . Obesity, unspecified 08/03/2013  . Hx of CABG   . Ejection fraction   . HTN (hypertension) 08/22/2012  . Hypothyroidism 09/29/2011  . Edema 08/25/2011  . Gastroparesis 09/07/2010  . Hyperlipidemia 02/21/2010  . Muscle spasm of left shoulder 04/29/2009  . PALPITATIONS 04/12/2009  .  WHEEZING 04/12/2009  . GERD 01/19/2009  . Chest pain 02/04/2008  . Diabetes mellitus type 2, insulin dependent (HCC) 06/12/2007  . Anxiety disorder 06/12/2007  . Coronary artery disease involving coronary bypass graft of native heart 06/12/2007  . Asthma in adult 06/12/2007    Past Surgical History:  Procedure Laterality Date  . ANGIOPLASTY     with stenting 3086,5784  . CARDIAC SURGERY    . CESAREAN SECTION    . CORONARY ARTERY BYPASS GRAFT  01/26/2009  . EYE SURGERY    . open heart surgery   2010  . TUBAL LIGATION      OB History    No data available       Home Medications    Prior to Admission medications   Medication Sig Start Date End Date Taking? Authorizing Provider  albuterol (PROVENTIL) (2.5 MG/3ML) 0.083% nebulizer solution Take 2.5 mg by nebulization every 4 (four) hours as needed for wheezing or shortness of breath.    Yes [provider]  albuterol (VENTOLIN HFA) 108 (90 BASE) MCG/ACT inhaler Inhale 2 puffs into the lungs every 6 (six) hours as needed for wheezing or shortness of breath.    Yes [provider]  ALPRAZolam Prudy Feeler) 0.5  MG tablet Take 0.5 mg by mouth daily.    Yes [provider]  aspirin 81 MG chewable tablet Chew 81 mg by mouth daily.   Yes [provider]  azelastine (ASTELIN) 0.1 % nasal spray Place 2 sprays 2 (two) times daily into both nostrils. 04/03/17  Yes [provider]  budesonide-formoterol (SYMBICORT) 160-4.5 MCG/ACT inhaler Inhale 2 puffs into the lungs every 12 (twelve) hours. 03/19/17  Yes Roslynn AmbleNestor, Jennings E, MD  fish oil-omega-3 fatty acids 1000 MG capsule Take 2 capsules (2 g total) by mouth 2 (two) times daily. 07/03/12  Yes Serpe, Clide DeutscherEugene C, PA-C  furosemide (LASIX) 40 MG tablet TAKE 1 TABLET (40 MG TOTAL) BY MOUTH DAILY. 02/04/15  Yes Luis AbedKatz, Jeffrey D, MD  hyoscyamine (LEVSIN SL) 0.125 MG SL tablet PLACE 1 TABLET (0.125 MG TOTAL) UNDER THE TONGUE EVERY 8 (EIGHT) HOURS AS NEEDED. 02/08/17  Yes  Armbruster, Willaim RayasSteven P, MD  Insulin Human (INSULIN PUMP) SOLN Pt uses up to 150 units of NOVOLOG daily.   Yes [provider]  levothyroxine (SYNTHROID, LEVOTHROID) 100 MCG tablet Take 100 mcg by mouth daily before breakfast.   Yes [provider]  metoprolol tartrate (LOPRESSOR) 25 MG tablet Take 0.5 tablets (12.5 mg total) by mouth daily. Take 0.5 tablet by mouth once daily 03/22/17  Yes Laqueta LindenKoneswaran, Suresh A, MD  montelukast (SINGULAIR) 5 MG chewable tablet Chew 2 tablets (10 mg total) by mouth at bedtime. 03/19/17  Yes Roslynn AmbleNestor, Jennings E, MD  nitroGLYCERIN (NITROSTAT) 0.4 MG SL tablet Place 0.4 mg under the tongue every 5 (five) minutes as needed for chest pain.   Yes [provider]  PREVACID SOLUTAB 30 MG disintegrating tablet TAKE 1 TABLET THIRTY MINUTES BEFORE BREAKFAST AND THIRTY MINUTES BEFORE SUPPER 02/19/17  Yes Armbruster, Willaim RayasSteven P, MD  Pseudoeph-Doxylamine-DM-APAP (NYQUIL PO) Take 30 mLs by mouth at bedtime as needed (for cough).   Yes [provider]  rosuvastatin (CRESTOR) 20 MG tablet TAKE 1 TABLET (20 MG TOTAL) BY MOUTH DAILY. 06/29/16  Yes Laqueta LindenKoneswaran, Suresh A, MD  Spacer/Aero Chamber Mouthpiece MISC 1 Device by Does not apply route as directed. 03/19/17  Yes Roslynn AmbleNestor, Jennings E, MD  tiZANidine (ZANAFLEX) 4 MG tablet Take 2-4 mg by mouth at bedtime as needed for muscle spasms.   Yes [provider]  budesonide-formoterol (SYMBICORT) 160-4.5 MCG/ACT inhaler Inhale 2 puffs into the lungs every 12 (twelve) hours. Patient not taking: Reported on 04/09/2017 03/19/17   Roslynn AmbleNestor, Jennings E, MD  glucose blood (CONTOUR NEXT TEST) test strip TEST BLOOD GLUCOSE EIGHT TIMES DAILY 09/26/16   [provider]  predniSONE (DELTASONE) 10 MG tablet Take 4 tablets (40 mg total) daily by mouth. 04/09/17   Vanetta MuldersZackowski, Chyla Schlender, MD    Family History Family History  Problem Relation Age of Onset  . Heart disease Father   . Asthma Father   . COPD Father   .  Diabetes Mother   . Asthma Paternal Aunt   . Colon cancer Neg Hx   . Colon polyps Neg Hx   . Esophageal cancer Neg Hx   . Rectal cancer Neg Hx   . Stomach cancer Neg Hx     Social History Social History   Tobacco Use  . Smoking status: Former Smoker    Packs/day: 1.00    Years: 3.00    Pack years: 3.00    Types: Cigarettes    Start date: 03/14/1991    Last attempt to quit: 05/22/1993    Years since  quitting: 23.8  . Smokeless tobacco: Never Used  . Tobacco comment:  Year Quit: 1995  Substance Use Topics  . Alcohol use: No    Alcohol/week: 0.0 oz  . Drug use: No     Allergies   Metformin; Penicillins; and Metoclopramide   Review of Systems Review of Systems  Constitutional: Negative for fever.  HENT: Positive for trouble swallowing. Negative for congestion and facial swelling.   Eyes: Negative for redness.  Respiratory: Positive for shortness of breath and wheezing.   Cardiovascular: Negative for chest pain.  Gastrointestinal: Negative for abdominal pain.  Genitourinary: Negative for dysuria.  Musculoskeletal: Negative for myalgias.  Skin: Negative for rash.  Neurological: Negative for syncope and headaches.  Hematological: Does not bruise/bleed easily.  Psychiatric/Behavioral: Negative for confusion.     Physical Exam Updated Vital Signs BP 120/65   Pulse 70   Temp 97.8 F (36.6 C) (Oral)   Resp 14   Ht 1.626 m (5\' 4" )   Wt 104.8 kg (231 lb)   SpO2 98%   BMI 39.65 kg/m   Physical Exam  Constitutional: She is oriented to person, place, and time. She appears well-developed and well-nourished. No distress.  HENT:  Head: Normocephalic and atraumatic.  Mouth/Throat: Oropharynx is clear and moist. No oropharyngeal exudate or posterior oropharyngeal edema.  No lip swelling no tongue swelling posterior pharynx normal.  Eyes: EOM are normal. Pupils are equal, round, and reactive to light.  Neck: Neck supple.  Cardiovascular: Normal rate and regular rhythm.   Pulmonary/Chest: Effort normal. No stridor. She has wheezes.  Abdominal: Soft. Bowel sounds are normal. There is no tenderness.  Musculoskeletal: Normal range of motion. She exhibits no edema.  Lymphadenopathy:    She has no cervical adenopathy.  Neurological: She is alert and oriented to person, place, and time. No cranial nerve deficit or sensory deficit. She exhibits normal muscle tone. Coordination normal.  Skin: Skin is warm.  Nursing note and vitals reviewed.    ED Treatments / Results  Labs (all labs ordered are listed, but only abnormal results are displayed) Labs Reviewed  BASIC METABOLIC PANEL - Abnormal; Notable for the following components:      Result Value   Glucose, Bld 155 (*)    All other components within normal limits  CBC - Abnormal; Notable for the following components:   WBC 11.6 (*)    RDW 15.9 (*)    All other components within normal limits  TROPONIN I    EKG  EKG Interpretation  Date/Time:  Monday April 09 2017 08:45:21 EST Ventricular Rate:  66 PR Interval:    QRS Duration: 99 QT Interval:  387 QTC Calculation: 406 R Axis:   51 Text Interpretation:  Sinus rhythm Low voltage, precordial leads Abnrm T, consider ischemia, anterolateral lds Confirmed by Vanetta MuldersZackowski, Robert Sunga [phone]([id number]) on 04/09/2017 12:00:36 PM       Radiology Dg Chest 2 View  Result Date: 04/09/2017 CLINICAL DATA:  Throat tightness EXAM: CHEST  2 VIEW COMPARISON:  03/19/2017 FINDINGS: Prior CABG. Heart and mediastinal contours are within normal limits. No focal opacities or effusions. No acute bony abnormality. IMPRESSION: No active cardiopulmonary disease. Electronically Signed   By: Charlett NoseKevin  Dover M.D.   On: 04/09/2017 09:33   Ct Soft Tissue Neck W Contrast  Result Date: 04/09/2017 CLINICAL DATA:  Sore throat stridor. Suspect tonsillitis or epiglottitis EXAM: CT NECK WITH CONTRAST TECHNIQUE: Multidetector CT imaging of the neck was performed using the standard protocol following  the bolus administration  of intravenous contrast. CONTRAST:  75mL ISOVUE-300 IOPAMIDOL (ISOVUE-300) INJECTION 61% COMPARISON:  None. FINDINGS: Pharynx and larynx: Normal. No mass or swelling. Normal tonsils. Epiglottis normal. Salivary glands: No inflammation, mass, or stone. Thyroid: Negative Lymph nodes: No pathologic or enlarged lymph nodes Vascular: Mild atherosclerotic disease right carotid bifurcation. Jugular vein patent bilaterally. Limited intracranial: Negative Visualized orbits: Negative Mastoids and visualized paranasal sinuses: Negative Skeleton: No acute skeletal abnormality. Upper chest: Negative Other: None IMPRESSION: Negative CT neck. No evidence of tonsillitis or epiglottitis. Normal airway. Electronically Signed   By: Marlan Palau M.D.   On: 04/09/2017 13:30    Procedures Procedures (including critical care time)  Medications Ordered in ED Medications  0.9 %  sodium chloride infusion ( Intravenous New Bag/Given 04/09/17 1450)  sodium chloride 0.9 % bolus 500 mL (0 mLs Intravenous Stopped 04/09/17 1306)  methylPREDNISolone sodium succinate (SOLU-MEDROL) 125 mg/2 mL injection 125 mg (125 mg Intravenous Given 04/09/17 1223)  famotidine (PEPCID) IVPB 20 mg premix (0 mg Intravenous Stopped 04/09/17 1306)  ipratropium-albuterol (DUONEB) 0.5-2.5 (3) MG/3ML nebulizer solution 3 mL (3 mLs Nebulization Given 04/09/17 1237)  iopamidol (ISOVUE-300) 61 % injection 75 mL (75 mLs Intravenous Contrast Given 04/09/17 1312)  ipratropium-albuterol (DUONEB) 0.5-2.5 (3) MG/3ML nebulizer solution 3 mL (3 mLs Nebulization Given 04/09/17 1434)     Initial Impression / Assessment and Plan / ED Course  I have reviewed the triage vital signs and the nursing notes.  Pertinent labs & imaging results that were available during my care of the patient were reviewed by me and considered in my medical decision making (see chart for details).     Workup for the fullness sensation in the lower part of the  echo was CT soft tissue neck with IV contrast without any acute findings.  Chest x-ray negative.  Patient's wheezing treated with albuterol Atrovent nebulizer x2 and Solu-Medrol.  Patient wheezing not completely resolved but significant improvement.  Oxygen saturations fine.  Patient in no acute distress.  Patient will be treated with a 5-day course of prednisone and will continue her albuterol nebulizer and inhalers at home.  Will follow up with her doctor.  Final Clinical Impressions(s) / ED Diagnoses   Final diagnoses:  Moderate persistent asthma with exacerbation  Throat tightness    ED Discharge Orders        Ordered    predniSONE (DELTASONE) 10 MG tablet  Daily     04/09/17 1529       Vanetta Mulders, MD 04/09/17 1601

## 2017-04-18 ENCOUNTER — Other Ambulatory Visit: Payer: Self-pay

## 2017-04-18 DIAGNOSIS — R140 Abdominal distension (gaseous): Secondary | ICD-10-CM

## 2017-04-18 MED ORDER — HYOSCYAMINE SULFATE 0.125 MG SL SUBL: 0.1250 mg | SUBLINGUAL_TABLET | Freq: Three times a day (TID) | SUBLINGUAL | 0 refills | Status: DC | PRN

## 2017-04-19 ENCOUNTER — Encounter: Payer: Self-pay | Admitting: Pulmonary Disease

## 2017-04-19 ENCOUNTER — Ambulatory Visit (INDEPENDENT_AMBULATORY_CARE_PROVIDER_SITE_OTHER): Payer: Medicare Other | Admitting: Pulmonary Disease

## 2017-04-19 ENCOUNTER — Ambulatory Visit (INDEPENDENT_AMBULATORY_CARE_PROVIDER_SITE_OTHER): Payer: Medicare Other | Admitting: *Deleted

## 2017-04-19 VITALS — BP 132/76 | HR 94 | Ht 64.0 in | Wt 233.0 lb

## 2017-04-19 DIAGNOSIS — I25768 Atherosclerosis of bypass graft of coronary artery of transplanted heart with other forms of angina pectoris: Secondary | ICD-10-CM | POA: Diagnosis not present

## 2017-04-19 DIAGNOSIS — R0602 Shortness of breath: Secondary | ICD-10-CM

## 2017-04-19 DIAGNOSIS — J069 Acute upper respiratory infection, unspecified: Secondary | ICD-10-CM | POA: Diagnosis not present

## 2017-04-19 DIAGNOSIS — J302 Other seasonal allergic rhinitis: Secondary | ICD-10-CM

## 2017-04-19 DIAGNOSIS — K219 Gastro-esophageal reflux disease without esophagitis: Secondary | ICD-10-CM | POA: Diagnosis not present

## 2017-04-19 DIAGNOSIS — J4550 Severe persistent asthma, uncomplicated: Secondary | ICD-10-CM | POA: Diagnosis not present

## 2017-04-19 DIAGNOSIS — B370 Candidal stomatitis: Secondary | ICD-10-CM | POA: Diagnosis not present

## 2017-04-19 LAB — POCT INFLUENZA A/B
INFLUENZA A, POC: NEGATIVE
INFLUENZA B, POC: NEGATIVE

## 2017-04-19 MED ORDER — RANITIDINE HCL 150 MG/10ML PO SYRP
150.0000 mg | ORAL_SOLUTION | Freq: Every day | ORAL | 3 refills | Status: DC
Start: 2017-04-19 — End: 2017-05-30

## 2017-04-19 MED ORDER — NYSTATIN 100000 UNIT/ML MT SUSP: 5.0000 mL | Freq: Four times a day (QID) | OROMUCOSAL | 0 refills | Status: DC

## 2017-04-19 MED ORDER — RANITIDINE NICU ORAL SOLUTION 25 MG/ML: 2.0000 mg/kg | Freq: Three times a day (TID) | ORAL | 3 refills | Status: DC

## 2017-04-19 NOTE — Progress Notes (Signed)
Subjective:    Patient ID: [name], female    DOB: [birth year], 51 y.o.   MRN: [id number]  C.C.:  Follow-up for Severe, Persistent Asthma, GERD, & Chronic Seasonal Allergic Rhinitis.  HPI She reports she started with a sore throat and cough this morning. She reports her voice was hoarse this morning. No recent sick contacts.   Severe, persistent asthma: IM Depo-Medrol was administered at last appointment. Switched from Advair to Symbicort 160/4.5 at last appointment. Also started on Singulair 10 mg by mouth daily at bedtime. Developed a cough productive of a yellow mucus this morning. She reports mild wheezing today. She reports her symptoms have improved remarkably on Symbicort with regards to coughing & wheezing before her URI symptoms today. She reports most of her symptoms are first thing in the morning.   GERD: Follows with GI. Currently prescribed Prevacid. She still has significant reflux & dyspepsia.   Chronic seasonal allergic rhinitis: Significantly elevated eosinophil count. Serum testing showed sensitivity to cat and dog dander. Started on Singulair at last appointment. She reports she has improved sinus congestion & drainage on Singulair in conjunction with Astelin nasal spray   Review of Systems She reports she has had some fatigue starting today. No fever, chills, or sweats. No abdominal pain, nausea, emesis, or diarrhea. No myalgias or arthralgias.  Allergies  Allergen Reactions  . Metformin Anaphylaxis  . Penicillins Anaphylaxis and Other (See Comments)    Has patient had a PCN reaction causing immediate rash, facial/tongue/throat swelling, SOB or lightheadedness with hypotension: Yes Has patient had a PCN reaction causing severe rash involving mucus membranes or skin necrosis: No Has patient had a PCN reaction that required hospitalization No Has patient had a PCN reaction occurring within the last 10 years: No If all of the above answers are "NO", then may  proceed with Cephalosporin use.  . Metoclopramide Other (See Comments)    Reaction:  Nightmares     Current Outpatient Medications on File Prior to Visit  Medication Sig Dispense Refill  . albuterol (PROVENTIL) (2.5 MG/3ML) 0.083% nebulizer solution Take 2.5 mg by nebulization every 4 (four) hours as needed for wheezing or shortness of breath.     Marland Kitchen albuterol (VENTOLIN HFA) 108 (90 BASE) MCG/ACT inhaler Inhale 2 puffs into the lungs every 6 (six) hours as needed for wheezing or shortness of breath.     . ALPRAZolam (XANAX) 0.5 MG tablet Take 0.5 mg by mouth daily.     Marland Kitchen aspirin 81 MG chewable tablet Chew 81 mg by mouth daily.    Marland Kitchen azelastine (ASTELIN) 0.1 % nasal spray Place 2 sprays 2 (two) times daily into both nostrils.    . budesonide-formoterol (SYMBICORT) 160-4.5 MCG/ACT inhaler Inhale 2 puffs into the lungs every 12 (twelve) hours. 1 Inhaler 3  . budesonide-formoterol (SYMBICORT) 160-4.5 MCG/ACT inhaler Inhale 2 puffs into the lungs every 12 (twelve) hours. 1 Inhaler 0  . fish oil-omega-3 fatty acids 1000 MG capsule Take 2 capsules (2 g total) by mouth 2 (two) times daily.    . furosemide (LASIX) 40 MG tablet TAKE 1 TABLET (40 MG TOTAL) BY MOUTH DAILY. 30 tablet 6  . glucose blood (CONTOUR NEXT TEST) test strip TEST BLOOD GLUCOSE EIGHT TIMES DAILY    . hyoscyamine (LEVSIN SL) 0.125 MG SL tablet Place 1 tablet (0.125 mg total) under the tongue every 8 (eight) hours as needed. 180 tablet 0  . Insulin Human (INSULIN PUMP) SOLN Pt uses up to 150 units of  NOVOLOG daily.    Marland Kitchen levothyroxine (SYNTHROID, LEVOTHROID) 100 MCG tablet Take 100 mcg by mouth daily before breakfast.    . metoprolol tartrate (LOPRESSOR) 25 MG tablet Take 0.5 tablets (12.5 mg total) by mouth daily. Take 0.5 tablet by mouth once daily 45 tablet 3  . montelukast (SINGULAIR) 5 MG chewable tablet Chew 2 tablets (10 mg total) by mouth at bedtime. 60 tablet 3  . nitroGLYCERIN (NITROSTAT) 0.4 MG SL tablet Place 0.4 mg under the  tongue every 5 (five) minutes as needed for chest pain.    . predniSONE (DELTASONE) 10 MG tablet Take 4 tablets (40 mg total) daily by mouth. 20 tablet 0  . PREVACID SOLUTAB 30 MG disintegrating tablet TAKE 1 TABLET THIRTY MINUTES BEFORE BREAKFAST AND THIRTY MINUTES BEFORE SUPPER 60 tablet 3  . Pseudoeph-Doxylamine-DM-APAP (NYQUIL PO) Take 30 mLs by mouth at bedtime as needed (for cough).    . rosuvastatin (CRESTOR) 20 MG tablet TAKE 1 TABLET (20 MG TOTAL) BY MOUTH DAILY. 90 tablet 3  . Spacer/Aero Chamber Mouthpiece MISC 1 Device by Does not apply route as directed. 1 each 0  . tiZANidine (ZANAFLEX) 4 MG tablet Take 2-4 mg by mouth at bedtime as needed for muscle spasms.     Current Facility-Administered Medications on File Prior to Visit  Medication Dose Route Frequency Provider Last Rate Last Dose  . 0.9 %  sodium chloride infusion  500 mL Intravenous Continuous Armbruster, Willaim Rayas, MD      . 0.9 %  sodium chloride infusion  500 mL Intravenous Continuous Armbruster, Willaim Rayas, MD        Past Medical History:  Diagnosis Date  . Allergic rhinitis   . Allergy   . Anxiety   . Asthma   . Blood transfusion without reported diagnosis    2010 CABG  . CAD (coronary artery disease)    4v CABG, 9/10; NL LVF, status post followup Cardiolite November 2012 no ischemia ejection fraction 65%  . Cataract    are forming  . Diabetes mellitus    Scheduled to go on insulin pump  . Dyslipidemia     LDL 22 mg percent on Crestor  . Ejection fraction   . Esophageal stricture   . Gastroparesis due to DM (HCC)   . GERD (gastroesophageal reflux disease)   . Glaucoma   . History of heart attack   . Hx of CABG    September, 2010  . Hyperlipidemia   . Hypertension   . Hypothyroidism   . Myocardial infarction (HCC) 2010   2008 x 2 stenst, 209 x 2 stents. 01-2009 CABG x 4     Past Surgical History:  Procedure Laterality Date  . ANGIOPLASTY     with stenting 2440,1027  . CARDIAC SURGERY    .  CESAREAN SECTION    . CORONARY ARTERY BYPASS GRAFT  01/26/2009  . EYE SURGERY    . open heart surgery   2010  . TUBAL LIGATION      Family History  Problem Relation Age of Onset  . Heart disease Father   . Asthma Father   . COPD Father   . Diabetes Mother   . Asthma Paternal Aunt   . Colon cancer Neg Hx   . Colon polyps Neg Hx   . Esophageal cancer Neg Hx   . Rectal cancer Neg Hx   . Stomach cancer Neg Hx     Social History   Socioeconomic History  . Marital status: Single  Spouse name: None  . Number of children: 1  . Years of education: None  . Highest education level: None  Social Needs  . Financial resource strain: None  . Food insecurity - worry: None  . Food insecurity - inability: None  . Transportation needs - medical: None  . Transportation needs - non-medical: None  Occupational History  . Occupation: disabilied  Tobacco Use  . Smoking status: Former Smoker    Packs/day: 1.00    Years: 3.00    Pack years: 3.00    Types: Cigarettes    Start date: 03/14/1991    Last attempt to quit: 05/22/1993    Years since quitting: 23.9  . Smokeless tobacco: Never Used  . Tobacco comment:  Year Quit: 1995  Substance and Sexual Activity  . Alcohol use: No    Alcohol/week: 0.0 oz  . Drug use: No  . Sexual activity: Yes  Other Topics Concern  . None  Social History Narrative   [facility] (03/19/17):   Originally from [place], [place]. Has always lived in [place]. Previously worked at [employer]. She is disabled due to her prior cardiac history. 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 No pets currently. No bird exposure. She reports she did have mold in a prior home that she lived in for 3 years. She has since moved to a new home. She enjoys walking.       Objective:   Physical Exam BP 132/76 (BP Location: Left Arm, Cuff Size: Normal)   Pulse 94   Ht 5\' 4"  (1.626 m)   Wt 233 lb (105.7 kg)   SpO2 97%   BMI  39.99 kg/m   General:  Awake. No distress. Central obesity. Integument:  Warm. Dry. No rash. Extremities:  No cyanosis or clubbing.  HEENT:  Minimal nasal turbinate swelling. No oral ulcers. Poor dentition. Oral thrush present on tongue. Cardiovascular:  Regular rate. No edema. Unable to appreciate JVD.  Pulmonary:  Good aeration bilaterally & clear with auscultation.  Abdomen: Soft. Normal bowel sounds. Mildly protuberant. Musculoskeletal:  Normal bulk and tone. No joint deformity or effusion appreciated. Neurological:  Cranial nerves 2-12 grossly in tact. No meningismus. Moving all 4 extremities equally.   6MWT 04/19/17:  Walked 291 meters / Baseline Sat 100% on RA / Nadir Sat 98% on RA  IMAGING CXR PA/LAT 10/01/16 (previously reviewed by me):  Rierson sternotomy wires noted. Some kyphosis on lateral view. No parenchymal mass or opacity appreciated. Low lung volumes. No pleural effusion. No cardiomegaly. Mediastinum normal in contour.  CARDIAC TTE (04/14/16):  LV normal in size with mild concentric hypertrophy. EF 55-60% with no regional wall motion abnormalities. Normal LV diastolic function. LA mildly dilated & RA normal in size. RV normal in size and function. No aortic stenosis or regurgitation. No mitral stenosis or regurgitation. Trivial pulmonic regurgitation. Mild tricuspid regurgitation. No pericardial effusion.  LABS 03/19/17 IgE: 33 RAST panel: Dog 0.12 & Cat 0.12 CBC: 13.3/12.9/39.6/? Eosinophils: 0.5    Assessment & Plan:  51 y.o. female with underlying severe, persistent asthma. Her 6 minute walk test shows no significant desaturation or oxygen requirement reviewed today. She has improved control of not only her asthma but also her chronic seasonal allergic rhinitis with her current regimen. As such, I am not adjusting her medications. However, she is having increased reflux and I believe may benefit from the addition of Zantac to her regimen. She also has evidence of oral  thrush on physical exam. We discussed proper oral hygiene at length today. I instructed the patient to contact my office if she had any new breathing problems or questions before her next appointment.  1. Viral URI: Flu swab negative today. Recommended Mucinex, rest, and plenty of liquids. 2. Severe, persistent asthma: Continuing Symbicort and Singulair. No changes. Full pulmonary function testing pending. 3. GERD: Adding liquid Zantac daily at bedtime to patient's Prevacid regimen. 4. Oral thrush: Prescribing nystatin swish and swallow 4 times a day. Instructed patient on proper oral hygiene. 5. Chronic seasonal allergic rhinitis: Continuing Singulair and Astelin nasal spray. No changes. 6. Health maintenance: Plan to address at next appointment. 7. Follow-up: Return to clinic in 3 months or sooner if needed.   Donna ChristenJennings E. Jamison NeighborNestor, M.D. [facility] Pager:  [phone] After 7pm or if no response, call [phone] 3:15 PM 04/19/17

## 2017-04-19 NOTE — Patient Instructions (Addendum)
   Continue using your Symbicort and other medications as prescribed.  Remember to brush your teeth, brush your tongue, then rinse after you use your Symbicort inhaler to help prevent the thrush.  Take the Nystatin as prescribed until the white film disappears from your tongue.  Call my office if you have any new breathing problems or feel you're not getting better.  You can also use Mucinex twice daily to help thin out your mucus.

## 2017-04-19 NOTE — Progress Notes (Signed)
SIX MIN WALK 04/19/2017  Medications Proventil 2.5, Xanax 0.5 & Metoprolol 25 all taken at 8am  Supplimental Oxygen during Test? (L/min) No  Laps 6  Partial Lap (in Meters) 3  Baseline BP (sitting) 122/60  Baseline Heartrate 91  Baseline Dyspnea (Borg Scale) 1  Baseline Fatigue (Borg Scale) 0  Baseline SPO2 100  BP (sitting) 128/64  Heartrate 122  Dyspnea (Borg Scale) 2  Fatigue (Borg Scale) 0  SPO2 98  BP (sitting) 120/60  Heartrate 97  SPO2 99  Stopped or Paused before Six Minutes No  Interpretation (No Data)  Distance Completed 291  Tech Comments: test performed with forehead probe. pt completed test at moderate pace with no desats. pt did c/o of chest burning after test but states this is normal with this amount of exertion after oprn heart surgery.

## 2017-04-20 ENCOUNTER — Ambulatory Visit: Payer: Medicare Other | Admitting: Pulmonary Disease

## 2017-05-29 ENCOUNTER — Other Ambulatory Visit: Payer: Self-pay | Admitting: Emergency Medicine

## 2017-05-29 NOTE — Telephone Encounter (Signed)
Spoke with pt, she states she has noticed she has to use more of the Zantac to control her GERD. She started to take the medication in the morning and night because it has gotten so bad her throat was burning. This has resulted in her Rx running out too fast and the pharmacist told her to call her doctor to let him know she was using it more than prescribed. Please advise.

## 2017-05-30 MED ORDER — RANITIDINE HCL 150 MG/10ML PO SYRP: 150.0000 mg | ORAL_SOLUTION | Freq: Every day | ORAL | 3 refills | Status: DC

## 2017-05-30 NOTE — Telephone Encounter (Signed)
Pt is taking both prevacid and zantac.  Per RB, ok to refill Zantac until she follows up with GI.    Spoke with pt, aware of recs.  Will follow up with GI.  Refill sent.  Nothing further needed.

## 2017-05-30 NOTE — Telephone Encounter (Signed)
It looks like she is seen by gastroenterology. If she is already on PPI + pepcid then she needs to call GI for an OV

## 2017-05-30 NOTE — Telephone Encounter (Signed)
Is she still on prevacid as well? May need to be on PPI is she is not already

## 2017-05-31 ENCOUNTER — Other Ambulatory Visit: Payer: Self-pay | Admitting: *Deleted

## 2017-05-31 MED ORDER — RANITIDINE HCL 150 MG/10ML PO SYRP: 150.0000 mg | ORAL_SOLUTION | Freq: Every day | ORAL | 3 refills | Status: DC

## 2017-06-06 ENCOUNTER — Other Ambulatory Visit: Payer: Self-pay | Admitting: Cardiovascular Disease

## 2017-06-06 MED ORDER — FUROSEMIDE 40 MG PO TABS: ORAL_TABLET | ORAL | 6 refills | Status: DC

## 2017-06-06 NOTE — Telephone Encounter (Signed)
°*  STAT* If patient is at the pharmacy, call can be transferred to refill team.   1. Which medications need to be refilled? Lasix  2. Which pharmacy/location (including street and city if local pharmacy) is medication to be sent to? [facility]  3. Do they need a 30 day or 90 day supply?

## 2017-06-06 NOTE — Telephone Encounter (Signed)
Medication sent to pharmacy  

## 2017-06-14 ENCOUNTER — Ambulatory Visit (INDEPENDENT_AMBULATORY_CARE_PROVIDER_SITE_OTHER): Payer: Medicare Other | Admitting: Cardiovascular Disease

## 2017-06-14 ENCOUNTER — Ambulatory Visit (INDEPENDENT_AMBULATORY_CARE_PROVIDER_SITE_OTHER): Payer: Medicare Other

## 2017-06-14 ENCOUNTER — Other Ambulatory Visit: Payer: Self-pay

## 2017-06-14 ENCOUNTER — Encounter: Payer: Self-pay | Admitting: Cardiovascular Disease

## 2017-06-14 VITALS — BP 118/72 | HR 86 | Ht 64.0 in | Wt 239.0 lb

## 2017-06-14 DIAGNOSIS — I209 Angina pectoris, unspecified: Secondary | ICD-10-CM | POA: Diagnosis not present

## 2017-06-14 DIAGNOSIS — Z794 Long term (current) use of insulin: Secondary | ICD-10-CM

## 2017-06-14 DIAGNOSIS — I10 Essential (primary) hypertension: Secondary | ICD-10-CM | POA: Diagnosis not present

## 2017-06-14 DIAGNOSIS — E118 Type 2 diabetes mellitus with unspecified complications: Secondary | ICD-10-CM

## 2017-06-14 DIAGNOSIS — R600 Localized edema: Secondary | ICD-10-CM | POA: Diagnosis not present

## 2017-06-14 DIAGNOSIS — R062 Wheezing: Secondary | ICD-10-CM

## 2017-06-14 DIAGNOSIS — I25768 Atherosclerosis of bypass graft of coronary artery of transplanted heart with other forms of angina pectoris: Secondary | ICD-10-CM | POA: Diagnosis not present

## 2017-06-14 DIAGNOSIS — E782 Mixed hyperlipidemia: Secondary | ICD-10-CM | POA: Diagnosis not present

## 2017-06-14 DIAGNOSIS — K3184 Gastroparesis: Secondary | ICD-10-CM | POA: Diagnosis not present

## 2017-06-14 LAB — ECHOCARDIOGRAM COMPLETE
HEIGHTINCHES: 64 in
Weight: 3824 oz

## 2017-06-14 MED ORDER — POTASSIUM CHLORIDE ER 10 MEQ PO TBCR: 10.0000 meq | EXTENDED_RELEASE_TABLET | Freq: Every day | ORAL | 6 refills | Status: DC

## 2017-06-14 NOTE — Patient Instructions (Signed)
Medication Instructions:   Begin Potassium daily.  Continue all other medications.    Labwork:  BMET - order given today.  Office will contact with results via phone or letter.    Testing/Procedures:  Your physician has requested that you have an echocardiogram. Echocardiography is a painless test that uses sound waves to create images of your heart. It provides your doctor with information about the size and shape of your heart and how well your heart's chambers and valves are working. This procedure takes approximately one hour. There are no restrictions for this procedure.  Office will contact with results via phone or letter.    Follow-Up: 3 months   Any Other Special Instructions Will Be Listed Below (If Applicable).  If you need a refill on your cardiac medications before your next appointment, please call your pharmacy.

## 2017-06-14 NOTE — Progress Notes (Signed)
SUBJECTIVE: The patient presents for follow-up of coronary artery disease and history of CABG. She underwent coronary angiography in 2014 and all of her bypass grafts were patent at that time. She also has a history of left sided chest wall muscle spasm which responds to muscle relaxants.  Echocardiogram 04/14/16: Normal left ventricular systolic function and regional wall motion, LVEF 55-60%.  She has noticed increased ankle and feet swelling over the past 3 or 4 weeks.  She had been off of her Lasix but started taking 40 mg daily about 3 weeks ago.  She has not had any recent blood test.  She has some chest burning if she walks very quickly and this is been going on for years.  She feels more bloated.  She does have gastroparesis.  She received a new insulin pump recently.  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      Review of Systems: As per "subjective", otherwise negative.  Allergies  Allergen Reactions  . Metformin Anaphylaxis  . Penicillins Anaphylaxis and Other (See Comments)    Has patient had a PCN reaction causing immediate rash, facial/tongue/throat swelling, SOB or lightheadedness with hypotension: Yes Has patient had a PCN reaction causing severe rash involving mucus membranes or skin necrosis: No Has patient had a PCN reaction that required hospitalization No Has patient had a PCN reaction occurring within the last 10 years: No If all of the above answers are "NO", then may proceed with Cephalosporin use.  . Metoclopramide Other (See Comments)    Reaction:  Nightmares     Current Outpatient Medications  Medication Sig Dispense Refill  . albuterol (PROVENTIL) (2.5 MG/3ML) 0.083% nebulizer solution Take 2.5 mg by nebulization every 4 (four) hours as needed for wheezing or shortness of breath.     Marland Kitchen albuterol (VENTOLIN HFA) 108 (90 BASE) MCG/ACT inhaler Inhale 2 puffs into the lungs every 6 (six) hours as needed for wheezing or shortness of breath.       . ALPRAZolam (XANAX) 0.5 MG tablet Take 0.5 mg by mouth daily.     Marland Kitchen aspirin 81 MG chewable tablet Chew 81 mg by mouth daily.    Marland Kitchen azelastine (ASTELIN) 0.1 % nasal spray Place 2 sprays 2 (two) times daily into both nostrils.    . budesonide-formoterol (SYMBICORT) 160-4.5 MCG/ACT inhaler Inhale 2 puffs into the lungs every 12 (twelve) hours. 1 Inhaler 3  . budesonide-formoterol (SYMBICORT) 160-4.5 MCG/ACT inhaler Inhale 2 puffs into the lungs every 12 (twelve) hours. 1 Inhaler 0  . fish oil-omega-3 fatty acids 1000 MG capsule Take 2 capsules (2 g total) by mouth 2 (two) times daily.    . furosemide (LASIX) 40 MG tablet TAKE 1 TABLET (40 MG TOTAL) BY MOUTH DAILY. 90 tablet 6  . glucose blood (CONTOUR NEXT TEST) test strip TEST BLOOD GLUCOSE EIGHT TIMES DAILY    . hyoscyamine (LEVSIN SL) 0.125 MG SL tablet Place 1 tablet (0.125 mg total) under the tongue every 8 (eight) hours as needed. 180 tablet 0  . Insulin Human (INSULIN PUMP) SOLN Pt uses up to 150 units of NOVOLOG daily.    Marland Kitchen levothyroxine (SYNTHROID, LEVOTHROID) 100 MCG tablet Take 100 mcg by mouth daily before breakfast.    . metoprolol tartrate (LOPRESSOR) 25 MG tablet Take 0.5 tablets (12.5 mg total) by mouth daily. Take 0.5 tablet by mouth once daily 45 tablet 3  . montelukast (SINGULAIR) 5 MG chewable tablet Chew 2 tablets (10 mg total) by mouth  at bedtime. 60 tablet 3  . nitroGLYCERIN (NITROSTAT) 0.4 MG SL tablet Place 0.4 mg under the tongue every 5 (five) minutes as needed for chest pain.    Marland Kitchen. nystatin (MYCOSTATIN) 100000 UNIT/ML suspension Take 5 mLs (500,000 Units total) by mouth 4 (four) times daily. 60 mL 0  . predniSONE (DELTASONE) 10 MG tablet Take 4 tablets (40 mg total) daily by mouth. 20 tablet 0  . PREVACID SOLUTAB 30 MG disintegrating tablet TAKE 1 TABLET THIRTY MINUTES BEFORE BREAKFAST AND THIRTY MINUTES BEFORE SUPPER 60 tablet 3  . Pseudoeph-Doxylamine-DM-APAP (NYQUIL PO) Take 30 mLs by mouth at bedtime as needed (for  cough).    . ranitidine (ZANTAC) 150 MG/10ML syrup Take 10 mLs (150 mg total) by mouth at bedtime. 300 mL 3  . rosuvastatin (CRESTOR) 20 MG tablet TAKE 1 TABLET (20 MG TOTAL) BY MOUTH DAILY. 90 tablet 3  . Spacer/Aero Chamber Mouthpiece MISC 1 Device by Does not apply route as directed. 1 each 0  . tiZANidine (ZANAFLEX) 4 MG tablet Take 2-4 mg by mouth at bedtime as needed for muscle spasms.     Current Facility-Administered Medications  Medication Dose Route Frequency Provider Last Rate Last Dose  . 0.9 %  sodium chloride infusion  500 mL Intravenous Continuous Armbruster, Willaim RayasSteven P, MD      . 0.9 %  sodium chloride infusion  500 mL Intravenous Continuous Armbruster, Willaim RayasSteven P, MD        Past Medical History:  Diagnosis Date  . Allergic rhinitis   . Allergy   . Anxiety   . Asthma   . Blood transfusion without reported diagnosis    2010 CABG  . CAD (coronary artery disease)    4v CABG, 9/10; NL LVF, status post followup Cardiolite November 2012 no ischemia ejection fraction 65%  . Cataract    are forming  . Diabetes mellitus    Scheduled to go on insulin pump  . Dyslipidemia     LDL 22 mg percent on Crestor  . Ejection fraction   . Esophageal stricture   . Gastroparesis due to DM (HCC)   . GERD (gastroesophageal reflux disease)   . Glaucoma   . History of heart attack   . Hx of CABG    September, 2010  . Hyperlipidemia   . Hypertension   . Hypothyroidism   . Myocardial infarction (HCC) 2010   2008 x 2 stenst, 209 x 2 stents. 01-2009 CABG x 4     Past Surgical History:  Procedure Laterality Date  . ANGIOPLASTY     with stenting [id number]  . CARDIAC SURGERY    . CESAREAN SECTION    . CORONARY ARTERY BYPASS GRAFT  01/26/2009  . EYE SURGERY    . open heart surgery   2010  . TUBAL LIGATION      Social History   Socioeconomic History  . Marital status: Single    Spouse name: Not on file  . Number of children: 1  . Years of education: Not on file  . Highest education  level: Not on file  Social Needs  . Financial resource strain: Not on file  . Food insecurity - worry: Not on file  . Food insecurity - inability: Not on file  . Transportation needs - medical: Not on file  . Transportation needs - non-medical: Not on file  Occupational History  . Occupation: disabilied  Tobacco Use  . Smoking status: Former Smoker    Packs/day: 1.00  Years: 3.00    Pack years: 3.00    Types: Cigarettes    Start date: 03/14/1991    Last attempt to quit: 05/22/1993    Years since quitting: 24.0  . Smokeless tobacco: Never Used  . Tobacco comment:  Year Quit: 1995  Substance and Sexual Activity  . Alcohol use: No    Alcohol/week: 0.0 oz  . Drug use: No  . Sexual activity: Yes  Other Topics Concern  . Not on file  Social History Narrative   [facility] (03/19/17):   Originally from [place], [place]. Has always lived in [place]. Previously worked at [employer]. She is disabled due to her prior cardiac history. 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 No pets currently. No bird exposure. She reports she did have mold in a prior home that she lived in for 3 years. She has since moved to a new home. She enjoys walking.      Vitals:   06/14/17 1407  BP: 118/72  Pulse: 86  SpO2: 98%  Weight: 239 lb (108.4 kg)  Height: 5\' 4"  (1.626 m)    Wt Readings from Last 3 Encounters:  06/14/17 239 lb (108.4 kg)  04/19/17 233 lb (105.7 kg)  04/09/17 231 lb (104.8 kg)     PHYSICAL EXAM General: NAD HEENT: Normal. Neck: No JVD, no thyromegaly. Lungs: Diffuse expiratory wheezes. CV: Regular rate and rhythm, normal S1/S2, no S3/S4, no murmur.  Trace bilateral peri-ankle and dorsal pedal edema.  No carotid bruit.   Abdomen: Soft, nontender, protuberant. Insulin pump noted.  Neurologic: Alert and oriented.  Psych: Normal affect. Skin: Normal. Musculoskeletal: No gross deformities.    ECG: Most recent ECG  reviewed.   Labs: Lab Results  Component Value Date/Time   K 3.6 04/09/2017 08:49 AM   BUN 7 04/09/2017 08:49 AM   CREATININE 0.70 04/09/2017 08:49 AM   ALT 10 09/22/2016 11:41 AM   TSH 2.11 06/12/2012 09:21 AM   HGB 12.1 04/09/2017 08:49 AM   HGB 12.9 03/19/2017 02:37 PM     Lipids: Lab Results  Component Value Date/Time   LDLCALC (H) 01/24/2009 04:00 AM    115        Total Cholesterol/HDL:CHD Risk Coronary Heart Disease Risk Table                     Men   Women  1/2 Average Risk   3.4   3.3  Average Risk       5.0   4.4  2 X Average Risk   9.6   7.1  3 X Average Risk  23.4   11.0        Use the calculated Patient Ratio above and the CHD Risk Table to determine the patient's CHD Risk.        ATP III CLASSIFICATION (LDL):  <100     mg/dL   Optimal  295-621  mg/dL   Near or Above                    Optimal  130-159  mg/dL   Borderline  308-657  mg/dL   High  >846     mg/dL   Very High   CHOL  96/29/5284 04:00 AM    185        ATP III CLASSIFICATION:  <200     mg/dL   Desirable  132-440  mg/dL   Borderline High  >=  240    mg/dL   High          TRIG 161 (H) 01/24/2009 04:00 AM   HDL 34 (L) 01/24/2009 04:00 AM       ASSESSMENT AND PLAN:  1. CAD with CABG: CCS class II angina. Continue ASA, metoprolol, and rosuvastatin. Unable to swallow Ranexa tablets.  2. Essential HTN: Controlled. No changes.  3. Hyperlipidemia: Continue statin therapy.  I will obtain a copy of lipids from her PCP.  4.  Type 2 insulin-dependent diabetes mellitus: She remains on insulin and now has a new insulin pump.  5.  Bilateral leg edema: I will obtain an echocardiogram to evaluate for interval changes in cardiac function.  She is taking Lasix 40 mg daily, I will provide potassium chloride 10 milliequivalents daily.  I will also obtain a basic metabolic panel.    Disposition: Follow up 3 months   Prentice Docker, M.D., F.A.C.C.

## 2017-06-15 ENCOUNTER — Telehealth: Payer: Self-pay | Admitting: *Deleted

## 2017-06-15 NOTE — Telephone Encounter (Signed)
Notes recorded by Lesle ChrisHill, Angela G, LPN on 1/30/86571/25/2019 at 8:20 AM EST Patient notified. Copy to pmd. Follow up scheduled for April with Dr. Purvis SheffieldKoneswaran.   ------  Notes recorded by Laqueta LindenKoneswaran, Suresh A, MD on 06/14/2017 at 4:29 PM EST Normal pumping function.

## 2017-06-22 ENCOUNTER — Other Ambulatory Visit: Payer: Self-pay | Admitting: Emergency Medicine

## 2017-06-22 LAB — BASIC METABOLIC PANEL
BUN / CREAT RATIO: 14 (ref 9–23)
BUN: 10 mg/dL (ref 6–24)
CHLORIDE: 100 mmol/L (ref 96–106)
CO2: 21 mmol/L (ref 20–29)
Calcium: 9.7 mg/dL (ref 8.7–10.2)
Creatinine, Ser: 0.69 mg/dL (ref 0.57–1.00)
GFR calc non Af Amer: 102 mL/min/{1.73_m2} (ref >59)
GFR, EST AFRICAN AMERICAN: 117 mL/min/{1.73_m2} (ref >59)
Glucose: 233 mg/dL — ABNORMAL HIGH (ref 65–99)
POTASSIUM: 4.5 mmol/L (ref 3.5–5.2)
Sodium: 137 mmol/L (ref 134–144)

## 2017-06-22 MED ORDER — LANSOPRAZOLE 30 MG PO TBDP: ORAL_TABLET | ORAL | 3 refills | Status: DC

## 2017-06-25 ENCOUNTER — Other Ambulatory Visit: Payer: Self-pay

## 2017-06-25 ENCOUNTER — Telehealth: Payer: Self-pay | Admitting: *Deleted

## 2017-06-25 DIAGNOSIS — R140 Abdominal distension (gaseous): Secondary | ICD-10-CM

## 2017-06-25 MED ORDER — HYOSCYAMINE SULFATE 0.125 MG SL SUBL: SUBLINGUAL_TABLET | SUBLINGUAL | 1 refills | Status: DC

## 2017-06-25 NOTE — Telephone Encounter (Signed)
Patient notified. Copy to pmd. Stated that she did go fasting, but had been on Prednisone & sugars have been fluctuating. ------  Notes recorded by Laqueta LindenKoneswaran, Suresh A, MD on 06/22/2017 at 8:31 AM EST High glucose.

## 2017-06-25 NOTE — Progress Notes (Signed)
Called and spoke to pt to clarify refill request for Hyoscyamine. She is taking 1 to 2x day, so 90 day script would be for 180.  [facility] [place] She also wanted Dr. Mervyn SkeetersA to know that her asthma has been really bad with a lot of wheezing in the am.  Dr. Levy Pupaobert Byrum (Pulmonology) prescribed liquid Ranitidine at night thinking that the acid reflux was possibly affecting her lungs. She indicated that it has helped and she is now taking 10ml twice a day.  She has a script from Dr. Delton CoombesByrum but wanted to see if Dr. Mervyn SkeetersA will fill that for her in the future.  OK to send #180 with 1 refill of Hyoscyamine 0.125 mg sublingual?

## 2017-06-25 NOTE — Progress Notes (Signed)
Yes we can refill both. Thanks

## 2017-07-10 ENCOUNTER — Other Ambulatory Visit: Payer: Self-pay | Admitting: Cardiovascular Disease

## 2017-07-17 ENCOUNTER — Other Ambulatory Visit: Payer: Medicare Other | Admitting: Obstetrics & Gynecology

## 2017-07-18 ENCOUNTER — Ambulatory Visit (INDEPENDENT_AMBULATORY_CARE_PROVIDER_SITE_OTHER): Payer: Medicare Other | Admitting: Emergency Medicine

## 2017-07-18 ENCOUNTER — Encounter: Payer: Self-pay | Admitting: Emergency Medicine

## 2017-07-18 VITALS — BP 132/70 | HR 73 | Ht 64.0 in | Wt 232.0 lb

## 2017-07-18 DIAGNOSIS — J45909 Unspecified asthma, uncomplicated: Secondary | ICD-10-CM

## 2017-07-18 DIAGNOSIS — J209 Acute bronchitis, unspecified: Secondary | ICD-10-CM | POA: Insufficient documentation

## 2017-07-18 DIAGNOSIS — I25768 Atherosclerosis of bypass graft of coronary artery of transplanted heart with other forms of angina pectoris: Secondary | ICD-10-CM

## 2017-07-18 DIAGNOSIS — J302 Other seasonal allergic rhinitis: Secondary | ICD-10-CM | POA: Diagnosis not present

## 2017-07-18 MED ORDER — MONTELUKAST SODIUM 10 MG PO TABS: 10.0000 mg | ORAL_TABLET | Freq: Every day | ORAL | 5 refills | Status: DC

## 2017-07-18 MED ORDER — AZITHROMYCIN 200 MG/5ML PO SUSR: ORAL | 0 refills | Status: DC

## 2017-07-18 MED ORDER — LORATADINE 10 MG PO TABS: 10.0000 mg | ORAL_TABLET | Freq: Every day | ORAL | 5 refills | Status: DC

## 2017-07-18 MED ORDER — FLUTICASONE FUROATE-VILANTEROL 100-25 MCG/INH IN AEPB: 1.0000 | INHALATION_SPRAY | Freq: Every day | RESPIRATORY_TRACT | 5 refills | Status: DC

## 2017-07-18 NOTE — Progress Notes (Signed)
Subjective:    Patient ID: [name], female    DOB: 06/04/1965, 52 y.o.   MRN: [id number]  HPI 52 year old woman with a history of CAD/CABG, diabetes with gastroparesis, hypertension, hypothyroidism. Had childhood asthma, grew out of it. She has been followed by Dr. Jamison NeighborNestor and carries a diagnosis of asthma that was made in her late 4230's.  Also with allergic rhinitis. She moved to a new apartment about a year ago, has had more asthma sx since that move - exertional SOB, wheezing in the am. The previous renter had a dog - allergy to pet dander. Has used albuterol nebs more frequently.  New worsening of green sinus drainage, fatigue, low appetite over the last 2 weeks.   She was tried on Symbicort last month, but she did not tolerate due to dry throat. She has singulair, but has not been taking reliably. She uses astelin NS prn, has been using this week.   Review of Systems  Past Medical History:  Diagnosis Date  . Allergic rhinitis   . Allergy   . Anxiety   . Asthma   . Blood transfusion without reported diagnosis    2010 CABG  . CAD (coronary artery disease)    4v CABG, 9/10; NL LVF, status post followup Cardiolite November 2012 no ischemia ejection fraction 65%  . Cataract    are forming  . Diabetes mellitus    Scheduled to go on insulin pump  . Dyslipidemia     LDL 22 mg percent on Crestor  . Ejection fraction   . Esophageal stricture   . Gastroparesis due to DM (HCC)   . GERD (gastroesophageal reflux disease)   . Glaucoma   . History of heart attack   . Hx of CABG    September, 2010  . Hyperlipidemia   . Hypertension   . Hypothyroidism   . Myocardial infarction (HCC) 2010   2008 x 2 stenst, 209 x 2 stents. 01-2009 CABG x 4      Family History  Problem Relation Age of Onset  . Heart disease Father   . Asthma Father   . COPD Father   . Diabetes Mother   . Asthma Paternal Aunt   . Colon cancer Neg Hx   . Colon polyps Neg Hx   . Esophageal cancer Neg Hx   .  Rectal cancer Neg Hx   . Stomach cancer Neg Hx      Social History   Socioeconomic History  . Marital status: Single    Spouse name: Not on file  . Number of children: 1  . Years of education: Not on file  . Highest education level: Not on file  Social Needs  . Financial resource strain: Not on file  . Food insecurity - worry: Not on file  . Food insecurity - inability: Not on file  . Transportation needs - medical: Not on file  . Transportation needs - non-medical: Not on file  Occupational History  . Occupation: disabilied  Tobacco Use  . Smoking status: Former Smoker    Packs/day: 1.00    Years: 3.00    Pack years: 3.00    Types: Cigarettes    Start date: 03/14/1991    Last attempt to quit: 05/22/1993    Years since quitting: 24.1  . Smokeless tobacco: Never Used  . Tobacco comment:  Year Quit: 1995  Substance and Sexual Activity  . Alcohol use: No    Alcohol/week: 0.0 oz  . Drug use:  No  . Sexual activity: Yes  Other Topics Concern  . Not on file  Social History Narrative   [facility] (03/19/17):   Originally from [place], [place]. Has always lived in [place]. Previously worked at [employer]. She is disabled due to her prior cardiac history. 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 No pets currently. No bird exposure. She reports she did have mold in a prior home that she lived in for 3 years. She has since moved to a new home. She enjoys walking.      Allergies  Allergen Reactions  . Metformin Anaphylaxis  . Penicillins Anaphylaxis and Other (See Comments)    Has patient had a PCN reaction causing immediate rash, facial/tongue/throat swelling, SOB or lightheadedness with hypotension: Yes Has patient had a PCN reaction causing severe rash involving mucus membranes or skin necrosis: No Has patient had a PCN reaction that required hospitalization No Has patient had a PCN reaction occurring within the last 10 years:  No If all of the above answers are "NO", then may proceed with Cephalosporin use.  . Metoclopramide Other (See Comments)    Reaction:  Nightmares      Outpatient Medications Prior to Visit  Medication Sig Dispense Refill  . albuterol (PROVENTIL) (2.5 MG/3ML) 0.083% nebulizer solution Take 2.5 mg by nebulization every 4 (four) hours as needed for wheezing or shortness of breath.     Marland Kitchen albuterol (VENTOLIN HFA) 108 (90 BASE) MCG/ACT inhaler Inhale 2 puffs into the lungs every 6 (six) hours as needed for wheezing or shortness of breath.     . ALPRAZolam (XANAX) 0.5 MG tablet Take 0.5 mg by mouth daily.     Marland Kitchen aspirin 81 MG chewable tablet Chew 81 mg by mouth daily.    Marland Kitchen azelastine (ASTELIN) 0.1 % nasal spray Place 2 sprays 2 (two) times daily into both nostrils.    . budesonide-formoterol (SYMBICORT) 160-4.5 MCG/ACT inhaler Inhale 2 puffs into the lungs every 12 (twelve) hours. 1 Inhaler 0  . fish oil-omega-3 fatty acids 1000 MG capsule Take 2 capsules (2 g total) by mouth 2 (two) times daily.    . furosemide (LASIX) 40 MG tablet TAKE 1 TABLET (40 MG TOTAL) BY MOUTH DAILY. 90 tablet 6  . glucose blood (CONTOUR NEXT TEST) test strip TEST BLOOD GLUCOSE EIGHT TIMES DAILY    . hyoscyamine (LEVSIN SL) 0.125 MG SL tablet Place 1 tablet (0.125 mg) under the tongue once to twice a day, as needed 180 tablet 1  . Insulin Human (INSULIN PUMP) SOLN Pt uses up to 150 units of NOVOLOG daily.    . lansoprazole (PREVACID SOLUTAB) 30 MG disintegrating tablet TAKE 1 TABLET THIRTY MINUTES BEFORE BREAKFAST AND THIRTY MINUTES BEFORE SUPPER 60 tablet 3  . levothyroxine (SYNTHROID, LEVOTHROID) 100 MCG tablet Take 100 mcg by mouth daily before breakfast.    . metoprolol tartrate (LOPRESSOR) 25 MG tablet Take 0.5 tablets (12.5 mg total) by mouth daily. Take 0.5 tablet by mouth once daily 45 tablet 3  . nitroGLYCERIN (NITROSTAT) 0.4 MG SL tablet Place 0.4 mg under the tongue every 5 (five) minutes as needed for chest pain.     Marland Kitchen nystatin (MYCOSTATIN) 100000 UNIT/ML suspension Take 5 mLs (500,000 Units total) by mouth 4 (four) times daily. 60 mL 0  . potassium chloride (K-DUR) 10 MEQ tablet Take 1 tablet (10 mEq total) by mouth daily. 30 tablet 6  . Pseudoeph-Doxylamine-DM-APAP (NYQUIL PO) Take 30 mLs  by mouth at bedtime as needed (for cough).    . ranitidine (ZANTAC) 150 MG/10ML syrup Take 10 mLs (150 mg total) by mouth at bedtime. 300 mL 3  . rosuvastatin (CRESTOR) 20 MG tablet TAKE 1 TABLET (20 MG TOTAL) BY MOUTH DAILY. 90 tablet 0  . Spacer/Aero Chamber Mouthpiece MISC 1 Device by Does not apply route as directed. 1 each 0  . tiZANidine (ZANAFLEX) 4 MG tablet Take 2-4 mg by mouth at bedtime as needed for muscle spasms.    . montelukast (SINGULAIR) 5 MG chewable tablet Chew 2 tablets (10 mg total) by mouth at bedtime. 60 tablet 3  . predniSONE (DELTASONE) 10 MG tablet Take 4 tablets (40 mg total) daily by mouth. 20 tablet 0   Facility-Administered Medications Prior to Visit  Medication Dose Route Frequency Provider Last Rate Last Dose  . 0.9 %  sodium chloride infusion  500 mL Intravenous Continuous Armbruster, Willaim Rayas, MD      . 0.9 %  sodium chloride infusion  500 mL Intravenous Continuous Armbruster, Willaim Rayas, MD             Objective:   Physical Exam Vitals:   07/18/17 1409  BP: 132/70  Pulse: 73  SpO2: 97%  Weight: 232 lb (105.2 kg)  Height: 5\' 4"  (1.626 m)   Gen: Pleasant, well-nourished, in no distress,  normal affect  ENT: No lesions,  mouth clear,  oropharynx clear, no postnasal drip  Neck: No JVD, no stridor  Lungs: No use of accessory muscles, few scattered exp wheezes.   Cardiovascular: RRR, heart sounds normal, no murmur or gallops, no peripheral edema  Musculoskeletal: No deformities, no cyanosis or clubbing  Neuro: alert, non focal  Skin: Warm, no lesions or rash      Assessment & Plan:  Asthma in adult She describes dyspnea and symptoms that are suggestive of the  diagnosis of adult asthma.  She seemed to have worsening symptoms when she moved to a new home, has been exposed to dog dander.  She was tried on Symbicort but did not tolerate due to dry throat.  I do believe based on her clinical history that she needs to be on a schedule bronchodilator.  We will try Breo to see if she benefits.  Continue Singulair.  We will perform pulmonary function testing at our next visit once this acute illness has resolved  Acute bronchitis Green sinus drainage, chest congestion and sputum.  This in the setting of increased dyspnea, wheeze.  I believe she needs to be treated for acute bronchitis.  We will use azithromycin, liquid formulation as she has difficulty swallowing the pills  Chronic seasonal allergic rhinitis Continue Singulair, add loratadine. Continue Astelin nasal spray as needed. She needs to have her carpets cleaned since the previous tenants in her apartment had a dog.  Levy Pupa, MD, PhD 07/18/2017, 5:13 PM [facility] [phone] or if no answer [phone]

## 2017-07-18 NOTE — Assessment & Plan Note (Addendum)
She describes dyspnea and symptoms that are suggestive of the diagnosis of adult asthma.  She seemed to have worsening symptoms when she moved to a new home, has been exposed to dog dander.  She was tried on Symbicort but did not tolerate due to dry throat.  I do believe based on her clinical history that she needs to be on a schedule bronchodilator.  We will try Breo to see if she benefits.  Continue Singulair.  We will perform pulmonary function testing at our next visit once this acute illness has resolved

## 2017-07-18 NOTE — Assessment & Plan Note (Signed)
Green sinus drainage, chest congestion and sputum.  This in the setting of increased dyspnea, wheeze.  I believe she needs to be treated for acute bronchitis.  We will use azithromycin, liquid formulation as she has difficulty swallowing the pills

## 2017-07-18 NOTE — Patient Instructions (Addendum)
Please start Singulair every night Start loratadine 10mg  daily Continue astelin nasal spray 2 sprays each nostril as needed for congestion Please take azithromycin as directed Start Brio 100, 1 inhalation once daily.  Remember to rinse and gargle after using this medication. You would likely benefit from a carpet cleaning to prevent dog dander exposure in your new apartment. We will perform full pulmonary function testing at your follow-up visit. Follow with Dr Delton CoombesByrum in 3 months with full PFT on the same day.

## 2017-07-18 NOTE — Assessment & Plan Note (Signed)
Continue Singulair, add loratadine. Continue Astelin nasal spray as needed. She needs to have her carpets cleaned since the previous tenants in her apartment had a dog.

## 2017-07-26 ENCOUNTER — Other Ambulatory Visit: Payer: Medicare Other | Admitting: Obstetrics & Gynecology

## 2017-08-21 ENCOUNTER — Other Ambulatory Visit: Payer: Self-pay | Admitting: Emergency Medicine

## 2017-08-28 ENCOUNTER — Other Ambulatory Visit: Payer: Medicare Other | Admitting: Obstetrics & Gynecology

## 2017-08-29 ENCOUNTER — Encounter (INDEPENDENT_AMBULATORY_CARE_PROVIDER_SITE_OTHER): Payer: Medicare Other | Admitting: Ophthalmology

## 2017-08-30 ENCOUNTER — Ambulatory Visit (INDEPENDENT_AMBULATORY_CARE_PROVIDER_SITE_OTHER): Payer: Medicare Other | Admitting: Physician Assistant

## 2017-08-30 ENCOUNTER — Encounter (INDEPENDENT_AMBULATORY_CARE_PROVIDER_SITE_OTHER): Payer: Self-pay

## 2017-08-30 ENCOUNTER — Encounter: Payer: Self-pay | Admitting: Physician Assistant

## 2017-08-30 VITALS — BP 130/70 | HR 68 | Ht 64.0 in | Wt 233.8 lb

## 2017-08-30 DIAGNOSIS — R1013 Epigastric pain: Secondary | ICD-10-CM | POA: Diagnosis not present

## 2017-08-30 DIAGNOSIS — K3184 Gastroparesis: Secondary | ICD-10-CM | POA: Diagnosis not present

## 2017-08-30 DIAGNOSIS — I25768 Atherosclerosis of bypass graft of coronary artery of transplanted heart with other forms of angina pectoris: Secondary | ICD-10-CM

## 2017-08-30 DIAGNOSIS — K219 Gastro-esophageal reflux disease without esophagitis: Secondary | ICD-10-CM

## 2017-08-30 MED ORDER — RANITIDINE HCL 150 MG/10ML PO SYRP: 150.0000 mg | ORAL_SOLUTION | Freq: Two times a day (BID) | ORAL | 5 refills | Status: DC

## 2017-08-30 NOTE — Progress Notes (Signed)
Chief Complaint: Dysphagia  HPI:    [name] is a 52 year old Caucasian female with a past medical history as listed below, who follows with Dr. Adela Lank and returns to clinic today for a continued complaint of dysphagia.      10/11/16 office visit with "terrible" dysphagia.  Had been using Prevacid 30 mg twice a day but still felt a "lump in there again".  Scheduled for an EGD with Dr. Adela Lank.  Changed patient from Prevacid to Pantoprazole 40 mg twice daily.  Prescribed GI cocktail.    10/19/16 EGD with a 2 cm hiatal hernia, no obvious stenosis this was dilated to 18 mm with mild resistance, moderate mucosal rent at 17 cm from the incisors, site of subtle stenosis, entire extent salmon stomach normal, duodenal bulb and second portion of duodenum normal.    06/25/17 phone call, requesting hyoscyamine refill which she was taking 1-2 times a day.  Also started on liquid ranitidine at night from pulmonology as they thought this reflux was possibly affecting her lungs, described that it was helping 10 mL's twice a day.    Today has developed an esophageal/epigastric pain which occurs 2-3 days out of the week and is worse if she has not eaten anything in a long time.  Typically, when she eats it relieves this pain slightly.  She can also take hyoscyamine which seems to help this pain.  Her pulmonologist recently added ranitidine which she has been taking twice a day and this also helps the pain.  When she wakes up in the morning she does have some phlegm in her throat which she tends to hack and cough up but denies any problems with swallowing her food or anything else throughout the day.    Describes recent cardiac workup with echo and visit with pulmonology.    Denies fever, chills, weight loss, anorexia, nausea, vomiting or symptoms that awaken her at night.  Past Medical History:  Diagnosis Date  . Allergic rhinitis   . Allergy   . Anxiety   . Asthma   . Blood transfusion without reported  diagnosis    2010 CABG  . CAD (coronary artery disease)    4v CABG, 9/10; NL LVF, status post followup Cardiolite November 2012 no ischemia ejection fraction 65%  . Cataract    are forming  . Diabetes mellitus    Scheduled to go on insulin pump  . Dyslipidemia     LDL 22 mg percent on Crestor  . Ejection fraction   . Esophageal stricture   . Gastroparesis due to DM (HCC)   . GERD (gastroesophageal reflux disease)   . Glaucoma   . History of heart attack   . Hx of CABG    September, 2010  . Hyperlipidemia   . Hypertension   . Hypothyroidism   . Myocardial infarction (HCC) 2010   2008 x 2 stenst, 209 x 2 stents. 01-2009 CABG x 4     Past Surgical History:  Procedure Laterality Date  . ANGIOPLASTY     with stenting 4098,1191  . CARDIAC SURGERY    . CESAREAN SECTION    . CORONARY ARTERY BYPASS GRAFT  01/26/2009  . EYE SURGERY    . open heart surgery   2010  . TUBAL LIGATION      Current Outpatient Medications  Medication Sig Dispense Refill  . albuterol (PROVENTIL) (2.5 MG/3ML) 0.083% nebulizer solution Take 2.5 mg by nebulization every 4 (four) hours as needed for wheezing or shortness of  breath.     Marland Kitchen. albuterol (VENTOLIN HFA) 108 (90 BASE) MCG/ACT inhaler Inhale 2 puffs into the lungs every 6 (six) hours as needed for wheezing or shortness of breath.     . ALPRAZolam (XANAX) 0.5 MG tablet Take 0.5 mg by mouth daily.     Marland Kitchen. aspirin 81 MG chewable tablet Chew 81 mg by mouth daily.    Marland Kitchen. azelastine (ASTELIN) 0.1 % nasal spray Place 2 sprays 2 (two) times daily into both nostrils.    Marland Kitchen. azithromycin (ZITHROMAX) 200 MG/5ML suspension Take 12.855mL on day 1, then 6.6025mL on day 2-5 38 mL 0  . budesonide-formoterol (SYMBICORT) 160-4.5 MCG/ACT inhaler Inhale 2 puffs into the lungs every 12 (twelve) hours. 1 Inhaler 0  . fish oil-omega-3 fatty acids 1000 MG capsule Take 2 capsules (2 g total) by mouth 2 (two) times daily.    . fluticasone furoate-vilanterol (BREO ELLIPTA) 100-25 MCG/INH  AEPB Inhale 1 puff into the lungs daily. 60 each 5  . furosemide (LASIX) 40 MG tablet TAKE 1 TABLET (40 MG TOTAL) BY MOUTH DAILY. 90 tablet 6  . glucose blood (CONTOUR NEXT TEST) test strip TEST BLOOD GLUCOSE EIGHT TIMES DAILY    . hyoscyamine (LEVSIN SL) 0.125 MG SL tablet Place 1 tablet (0.125 mg) under the tongue once to twice a day, as needed 180 tablet 1  . Insulin Human (INSULIN PUMP) SOLN Pt uses up to 150 units of NOVOLOG daily.    . lansoprazole (PREVACID SOLUTAB) 30 MG disintegrating tablet TAKE 1 TABLET THIRTY MINUTES BEFORE BREAKFAST AND THIRTY MINUTES BEFORE SUPPER 60 tablet 3  . levothyroxine (SYNTHROID, LEVOTHROID) 100 MCG tablet Take 100 mcg by mouth daily before breakfast.    . loratadine (CLARITIN) 10 MG tablet Take 1 tablet (10 mg total) by mouth daily. 30 tablet 5  . metoprolol tartrate (LOPRESSOR) 25 MG tablet Take 0.5 tablets (12.5 mg total) by mouth daily. Take 0.5 tablet by mouth once daily 45 tablet 3  . montelukast (SINGULAIR) 10 MG tablet Take 1 tablet (10 mg total) by mouth at bedtime. 30 tablet 5  . nitroGLYCERIN (NITROSTAT) 0.4 MG SL tablet Place 0.4 mg under the tongue every 5 (five) minutes as needed for chest pain.    Marland Kitchen. nystatin (MYCOSTATIN) 100000 UNIT/ML suspension Take 5 mLs (500,000 Units total) by mouth 4 (four) times daily. 60 mL 0  . potassium chloride (K-DUR) 10 MEQ tablet Take 1 tablet (10 mEq total) by mouth daily. 30 tablet 6  . Pseudoeph-Doxylamine-DM-APAP (NYQUIL PO) Take 30 mLs by mouth at bedtime as needed (for cough).    . ranitidine (ZANTAC) 150 MG/10ML syrup Take 10 mLs (150 mg total) by mouth at bedtime. 300 mL 3  . ranitidine (ZANTAC) 75 MG/5ML syrup TAKE 10 MLS (150 MG TOTAL) BY MOUTH AT BEDTIME. 300 mL 2  . rosuvastatin (CRESTOR) 20 MG tablet TAKE 1 TABLET (20 MG TOTAL) BY MOUTH DAILY. 90 tablet 0  . Spacer/Aero Chamber Mouthpiece MISC 1 Device by Does not apply route as directed. 1 each 0  . tiZANidine (ZANAFLEX) 4 MG tablet Take 2-4 mg by  mouth at bedtime as needed for muscle spasms.     Current Facility-Administered Medications  Medication Dose Route Frequency Provider Last Rate Last Dose  . 0.9 %  sodium chloride infusion  500 mL Intravenous Continuous Armbruster, Willaim RayasSteven P, MD      . 0.9 %  sodium chloride infusion  500 mL Intravenous Continuous Armbruster, Willaim RayasSteven P, MD  Allergies as of 08/30/2017 - Review Complete 07/18/2017  Allergen Reaction Noted  . Metformin Anaphylaxis   . Penicillins Anaphylaxis and Other (See Comments)   . Metoclopramide Other (See Comments) 04/14/2016    Family History  Problem Relation Age of Onset  . Heart disease Father   . Asthma Father   . COPD Father   . Diabetes Mother   . Asthma Paternal Aunt   . Colon cancer Neg Hx   . Colon polyps Neg Hx   . Esophageal cancer Neg Hx   . Rectal cancer Neg Hx   . Stomach cancer Neg Hx     Social History   Socioeconomic History  . Marital status: Single    Spouse name: Not on file  . Number of children: 1  . Years of education: Not on file  . Highest education level: Not on file  Occupational History  . Occupation: disabilied  Social Needs  . Financial resource strain: Not on file  . Food insecurity:    Worry: Not on file    Inability: Not on file  . Transportation needs:    Medical: Not on file    Non-medical: Not on file  Tobacco Use  . Smoking status: Former Smoker    Packs/day: 1.00    Years: 3.00    Pack years: 3.00    Types: Cigarettes    Start date: 03/14/1991    Last attempt to quit: 05/22/1993    Years since quitting: 24.2  . Smokeless tobacco: Never Used  . Tobacco comment:  Year Quit: 1995  Substance and Sexual Activity  . Alcohol use: No    Alcohol/week: 0.0 oz  . Drug use: No  . Sexual activity: Yes  Lifestyle  . Physical activity:    Days per week: Not on file    Minutes per session: Not on file  . Stress: Not on file  Relationships  . Social connections:    Talks on phone: Not on file    Gets  together: Not on file    Attends religious service: Not on file    Active member of club or organization: Not on file    Attends meetings of clubs or organizations: Not on file    Relationship status: Not on file  . Intimate partner violence:    Fear of current or ex partner: Not on file    Emotionally abused: Not on file    Physically abused: Not on file    Forced sexual activity: Not on file  Other Topics Concern  . Not on file  Social History Narrative   [facility] (03/19/17):   Originally from [place], [place]. Has always lived in [place]. Previously worked at [employer]. She is disabled due to her prior cardiac history. 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 No pets currently. No bird exposure. She reports she did have mold in a prior home that she lived in for 3 years. She has since moved to a new home. She enjoys walking.     Review of Systems:    Constitutional: No weight loss, fever or chills Cardiovascular: No chest pain Respiratory: No SOB  Gastrointestinal: See HPI and otherwise negative   Physical Exam:  Vital signs: BP 130/70   Pulse 68   Ht 5\' 4"  (1.626 m)   Wt 233 lb 12.8 oz (106.1 kg)   LMP 07/16/2017 (Approximate)   BMI 40.13 kg/m   Constitutional:   Pleasant  Overweight Caucasian female appears to be in NAD, Well developed, Well nourished, alert and cooperative Respiratory: Respirations even and unlabored. Lungs clear to auscultation bilaterally.   No wheezes, crackles, or rhonchi.  Cardiovascular: Normal S1, S2. No MRG. Regular rate and rhythm. No peripheral edema, cyanosis or pallor.  Gastrointestinal:  Soft, nondistended,mild epigastric ttp. No rebound or guarding. Normal bowel sounds. No appreciable masses or hepatomegaly. Psychiatric:  Demonstrates good judgement and reason without abnormal affect or behaviors.  MOST RECENT LABS AND IMAGING: CBC    Component Value Date/Time   WBC 11.6 (H) 04/09/2017  0849   RBC 4.48 04/09/2017 0849   HGB 12.1 04/09/2017 0849   HGB 12.9 03/19/2017 1437   HCT 38.8 04/09/2017 0849   HCT 39.6 03/19/2017 1437   PLT 311 04/09/2017 0849   MCV 86.6 04/09/2017 0849   MCV 84 03/19/2017 1437   MCH 27.0 04/09/2017 0849   MCHC 31.2 04/09/2017 0849   RDW 15.9 (H) 04/09/2017 0849   RDW 16.2 (H) 03/19/2017 1437   LYMPHSABS 4.8 (H) 03/19/2017 1437   MONOABS 0.5 06/16/2013 1326   EOSABS 0.5 (H) 03/19/2017 1437   BASOSABS 0.1 03/19/2017 1437    CMP     Component Value Date/Time   NA 137 06/21/2017 1059   K 4.5 06/21/2017 1059   CL 100 06/21/2017 1059   CO2 21 06/21/2017 1059   GLUCOSE 233 (H) 06/21/2017 1059   GLUCOSE 155 (H) 04/09/2017 0849   BUN 10 06/21/2017 1059   CREATININE 0.69 06/21/2017 1059   CALCIUM 9.7 06/21/2017 1059   PROT 6.7 09/22/2016 1141   ALBUMIN 3.8 09/22/2016 1141   AST 13 09/22/2016 1141   ALT 10 09/22/2016 1141   ALKPHOS 91 09/22/2016 1141   BILITOT 0.5 09/22/2016 1141   GFRNONAA 102 06/21/2017 1059   GFRAA 117 06/21/2017 1059    Assessment: 1.  Epigastric pain: Increased over the past 2 weeks, worse on an empty stomach, better when eating small meals throughout the day, taking Ranitidine twice daily and using Hyoscyamine; consider relation to gastroparesis/spasm/GERD 2.  GERD: Controlled on twice daily liquid Ranitidine and twice daily liquid Prevacid  Plan: 1.  Discussed with patient that we will try scheduling out her Hyoscyamine 0.125 mg every morning to try and prevent morning esophageal pain. 2.  Also recommend the patient eat something for breakfast as this tends to decrease symptoms. 3.  Refilled Ranitidine liquid for twice daily dosing, every morning and nightly 4.  Continue Prevacid liquid twice daily 5.  Discussed with patient we will try to delay EGD at this time as she is not having any true dysphagia symptoms. 6.  Patient to follow in clinic in 6 weeks with myself or Dr. Adela Lank.  Kim Meeker,  PA-C [facility] 08/30/2017, 9:10 AM  Cc: Frederich Chick., MD

## 2017-08-30 NOTE — Progress Notes (Signed)
Agree with note as outlined. History of proximal esophageal stricture that was dilated and I think the cause of prior dysphagia, sounds like that has resolved. She also has a history of gastroparesis and noticed she is no longer on domperidone. Victorino DikeJennifer do you know when she stopped this or why she stopped it? She may benefit from resuming it, however sounds like she is otherwise responding to conservative therapy, would continue regimen as outlined.

## 2017-08-30 NOTE — Patient Instructions (Signed)
If you are age 51 or older, your body mass index should be between 23-30. Your Body mass index is 40.13 kg/m. If this is out of the aforementioned range listed, please consider follow up with your Primary Care Provider.  If you are age 51 or younger, your body mass index should be between 19-25. Your Body mass index is 40.13 kg/m. If this is out of the aformentioned range listed, please consider follow up with your Primary Care Provider.   We have sent the following medications to your pharmacy for you to pick up at your convenience: Ranitidine 10ml twice a day.  Start taking your hyoscyamine every morning.  Thank you for choosing [facility], Hyacinth MeekerJennifer Lemmon, PA-C

## 2017-08-31 ENCOUNTER — Telehealth: Payer: Self-pay

## 2017-08-31 NOTE — Progress Notes (Signed)
Patty please contact patient and ask about Domperidone. JLL

## 2017-08-31 NOTE — Telephone Encounter (Signed)
The pt stopped the Domperidone 3 months ago due to cost.  She states it did help while she was taking it.

## 2017-08-31 NOTE — Telephone Encounter (Signed)
The patient has been notified of this information and all questions answered. The pt states she will call back if she decides to resume domperidone.

## 2017-08-31 NOTE — Telephone Encounter (Signed)
Okay thanks for letting me know. Given her gastroparesis, intolerance to Reglan in the past, and that Domperidone provided some benefit, I think if she can afford it in the future she should resume it, as we don't have other good options for her gastroparesis.

## 2017-08-31 NOTE — Telephone Encounter (Signed)
Dr. Adela LankArmbruster. FYI

## 2017-08-31 NOTE — Telephone Encounter (Signed)
Author: Unk LightningLemmon, Jennifer Lynne, PA Service: Gastroenterology Author Type: Physician Assistant  Filed: 08/31/2017 8:51 AM Encounter Date: 08/30/2017 Status: Signed  Editor: Unk LightningLemmon, Jennifer Lynne, PA (Physician Assistant)       [] Hide copied text  [] Hover for details   [name] please contact patient and ask about Domperidone. JLL

## 2017-09-13 ENCOUNTER — Encounter: Payer: Self-pay | Admitting: Cardiovascular Disease

## 2017-09-13 ENCOUNTER — Other Ambulatory Visit: Payer: Self-pay

## 2017-09-13 ENCOUNTER — Ambulatory Visit (INDEPENDENT_AMBULATORY_CARE_PROVIDER_SITE_OTHER): Payer: Medicare Other | Admitting: Cardiovascular Disease

## 2017-09-13 VITALS — BP 102/63 | HR 68 | Ht 64.0 in | Wt 229.0 lb

## 2017-09-13 DIAGNOSIS — K3184 Gastroparesis: Secondary | ICD-10-CM

## 2017-09-13 DIAGNOSIS — I25768 Atherosclerosis of bypass graft of coronary artery of transplanted heart with other forms of angina pectoris: Secondary | ICD-10-CM

## 2017-09-13 DIAGNOSIS — Z794 Long term (current) use of insulin: Secondary | ICD-10-CM

## 2017-09-13 DIAGNOSIS — E785 Hyperlipidemia, unspecified: Secondary | ICD-10-CM

## 2017-09-13 DIAGNOSIS — E118 Type 2 diabetes mellitus with unspecified complications: Secondary | ICD-10-CM

## 2017-09-13 DIAGNOSIS — I10 Essential (primary) hypertension: Secondary | ICD-10-CM | POA: Diagnosis not present

## 2017-09-13 MED ORDER — NITROGLYCERIN 0.4 MG SL SUBL: 0.4000 mg | SUBLINGUAL_TABLET | SUBLINGUAL | 3 refills | Status: DC | PRN

## 2017-09-13 NOTE — Progress Notes (Signed)
SUBJECTIVE: The patient presents for follow-up of coronary artery disease and history of CABG. She underwent coronary angiography in 2014 and all of her bypass grafts were patent at that time. She also has a history of left sided chest wall muscle spasm which responds to muscle relaxants.  Most recent echocardiogram performed on 06/14/2017 showed normal left ventricular systolic and diastolic function, LVEF 60 to 16%.  She has exertional angina only when walking at a rapid pace.  When she eats she gets some shortness of breath and wheezing and a "grinding sensation" in the epigastric region.  She is scheduled to see GI on 10/11/2017.  She denies orthopnea, leg swelling, palpitations, and paroxysmal nocturnal dyspnea.     Review of Systems: As per "subjective", otherwise negative.  Allergies  Allergen Reactions  . Metformin Anaphylaxis  . Penicillins Anaphylaxis and Other (See Comments)    Has patient had a PCN reaction causing immediate rash, facial/tongue/throat swelling, SOB or lightheadedness with hypotension: Yes Has patient had a PCN reaction causing severe rash involving mucus membranes or skin necrosis: No Has patient had a PCN reaction that required hospitalization No Has patient had a PCN reaction occurring within the last 10 years: No If all of the above answers are "NO", then may proceed with Cephalosporin use.  . Metoclopramide Other (See Comments)    Reaction:  Nightmares     Current Outpatient Medications  Medication Sig Dispense Refill  . albuterol (PROVENTIL) (2.5 MG/3ML) 0.083% nebulizer solution Take 2.5 mg by nebulization every 4 (four) hours as needed for wheezing or shortness of breath.     Marland Kitchen albuterol (VENTOLIN HFA) 108 (90 BASE) MCG/ACT inhaler Inhale 2 puffs into the lungs every 6 (six) hours as needed for wheezing or shortness of breath.     . ALPRAZolam (XANAX) 0.5 MG tablet Take 0.5 mg by mouth daily.     Marland Kitchen aspirin 81 MG chewable tablet Chew 81 mg by  mouth daily.    Marland Kitchen azelastine (ASTELIN) 0.1 % nasal spray Place 2 sprays 2 (two) times daily into both nostrils.    . budesonide-formoterol (SYMBICORT) 160-4.5 MCG/ACT inhaler Inhale 2 puffs into the lungs every 12 (twelve) hours. 1 Inhaler 0  . fish oil-omega-3 fatty acids 1000 MG capsule Take 2 capsules (2 g total) by mouth 2 (two) times daily.    . fluticasone furoate-vilanterol (BREO ELLIPTA) 100-25 MCG/INH AEPB Inhale 1 puff into the lungs daily. 60 each 5  . furosemide (LASIX) 40 MG tablet TAKE 1 TABLET (40 MG TOTAL) BY MOUTH DAILY. 90 tablet 6  . glucose blood (CONTOUR NEXT TEST) test strip TEST BLOOD GLUCOSE EIGHT TIMES DAILY    . hyoscyamine (LEVSIN SL) 0.125 MG SL tablet Place 1 tablet (0.125 mg) under the tongue once to twice a day, as needed 180 tablet 1  . Insulin Human (INSULIN PUMP) SOLN Pt uses up to 150 units of NOVOLOG daily.    . lansoprazole (PREVACID SOLUTAB) 30 MG disintegrating tablet TAKE 1 TABLET THIRTY MINUTES BEFORE BREAKFAST AND THIRTY MINUTES BEFORE SUPPER 60 tablet 3  . levothyroxine (SYNTHROID, LEVOTHROID) 100 MCG tablet Take 100 mcg by mouth daily before breakfast.    . loratadine (CLARITIN) 10 MG tablet Take 1 tablet (10 mg total) by mouth daily. 30 tablet 5  . metoprolol tartrate (LOPRESSOR) 25 MG tablet Take 0.5 tablets (12.5 mg total) by mouth daily. Take 0.5 tablet by mouth once daily 45 tablet 3  . montelukast (SINGULAIR) 10 MG tablet Take  1 tablet (10 mg total) by mouth at bedtime. 30 tablet 5  . nitroGLYCERIN (NITROSTAT) 0.4 MG SL tablet Place 0.4 mg under the tongue every 5 (five) minutes as needed for chest pain.    Marland Kitchen nystatin (MYCOSTATIN) 100000 UNIT/ML suspension Take 5 mLs (500,000 Units total) by mouth 4 (four) times daily. 60 mL 0  . potassium chloride (K-DUR) 10 MEQ tablet Take 1 tablet (10 mEq total) by mouth daily. 30 tablet 6  . Pseudoeph-Doxylamine-DM-APAP (NYQUIL PO) Take 30 mLs by mouth at bedtime as needed (for cough).    . ranitidine (ZANTAC)  150 MG/10ML syrup Take 10 mLs (150 mg total) by mouth 2 (two) times daily. 300 mL 5  . rosuvastatin (CRESTOR) 20 MG tablet TAKE 1 TABLET (20 MG TOTAL) BY MOUTH DAILY. 90 tablet 0  . Spacer/Aero Chamber Mouthpiece MISC 1 Device by Does not apply route as directed. 1 each 0  . tiZANidine (ZANAFLEX) 4 MG tablet Take 2-4 mg by mouth at bedtime as needed for muscle spasms.     Current Facility-Administered Medications  Medication Dose Route Frequency Provider Last Rate Last Dose  . 0.9 %  sodium chloride infusion  500 mL Intravenous Continuous Armbruster, Willaim Rayas, MD      . 0.9 %  sodium chloride infusion  500 mL Intravenous Continuous Armbruster, Willaim Rayas, MD        Past Medical History:  Diagnosis Date  . Allergic rhinitis   . Allergy   . Anxiety   . Asthma   . Blood transfusion without reported diagnosis    2010 CABG  . CAD (coronary artery disease)    4v CABG, 9/10; NL LVF, status post followup Cardiolite November 2012 no ischemia ejection fraction 65%  . Cataract    are forming  . Diabetes mellitus    Scheduled to go on insulin pump  . Dyslipidemia     LDL 22 mg percent on Crestor  . Ejection fraction   . Esophageal stricture   . Gastroparesis due to DM (HCC)   . GERD (gastroesophageal reflux disease)   . Glaucoma   . History of heart attack   . Hx of CABG    September, 2010  . Hyperlipidemia   . Hypertension   . Hypothyroidism   . Myocardial infarction (HCC) 2010   2008 x 2 stenst, 209 x 2 stents. 01-2009 CABG x 4     Past Surgical History:  Procedure Laterality Date  . ANGIOPLASTY     with stenting 1610,9604  . CARDIAC SURGERY    . CESAREAN SECTION    . CORONARY ARTERY BYPASS GRAFT  01/26/2009  . EYE SURGERY    . open heart surgery   2010  . TUBAL LIGATION      Social History   Socioeconomic History  . Marital status: Single    Spouse name: Not on file  . Number of children: 1  . Years of education: Not on file  . Highest education level: Not on file    Occupational History  . Occupation: disabilied  Social Needs  . Financial resource strain: Not on file  . Food insecurity:    Worry: Not on file    Inability: Not on file  . Transportation needs:    Medical: Not on file    Non-medical: Not on file  Tobacco Use  . Smoking status: Former Smoker    Packs/day: 1.00    Years: 3.00    Pack years: 3.00    Types:  Cigarettes    Start date: 03/14/1991    Last attempt to quit: 05/22/1993    Years since quitting: 24.3  . Smokeless tobacco: Never Used  . Tobacco comment:  Year Quit: 1995  Substance and Sexual Activity  . Alcohol use: No    Alcohol/week: 0.0 oz  . Drug use: No  . Sexual activity: Yes  Lifestyle  . Physical activity:    Days per week: Not on file    Minutes per session: Not on file  . Stress: Not on file  Relationships  . Social connections:    Talks on phone: Not on file    Gets together: Not on file    Attends religious service: Not on file    Active member of club or organization: Not on file    Attends meetings of clubs or organizations: Not on file    Relationship status: Not on file  . Intimate partner violence:    Fear of current or ex partner: Not on file    Emotionally abused: Not on file    Physically abused: Not on file    Forced sexual activity: Not on file  Other Topics Concern  . Not on file  Social History Narrative   [facility] (03/19/17):   Originally from [place], [place]. Has always lived in [place]. Previously worked at [employer]. She is disabled due to her prior cardiac history. 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 No pets currently. No bird exposure. She reports she did have mold in a prior home that she lived in for 3 years. She has since moved to a new home. She enjoys walking.      Vitals:   09/13/17 1128  BP: 102/63  Pulse: 68  SpO2: 97%  Weight: 229 lb (103.9 kg)  Height: 5\' 4"  (1.626 m)    Wt Readings from Last 3  Encounters:  09/13/17 229 lb (103.9 kg)  08/30/17 233 lb 12.8 oz (106.1 kg)  07/18/17 232 lb (105.2 kg)     PHYSICAL EXAM General: NAD HEENT: Normal. Neck: No JVD, no thyromegaly. Lungs: Clear to auscultation bilaterally with normal respiratory effort. CV: Regular rate and rhythm, normal S1/S2, no S3/S4, no murmur. No pretibial or periankle edema.  No carotid bruit.   Abdomen: Soft, nontender, no distention.  Neurologic: Alert and oriented.  Psych: Normal affect. Skin: Normal. Musculoskeletal: No gross deformities.    ECG: Most recent ECG reviewed.   Labs: Lab Results  Component Value Date/Time   K 4.5 06/21/2017 10:59 AM   BUN 10 06/21/2017 10:59 AM   CREATININE 0.69 06/21/2017 10:59 AM   ALT 10 09/22/2016 11:41 AM   TSH 2.11 06/12/2012 09:21 AM   HGB 12.1 04/09/2017 08:49 AM   HGB 12.9 03/19/2017 02:37 PM     Lipids: Lab Results  Component Value Date/Time   LDLCALC (H) 01/24/2009 04:00 AM    115        Total Cholesterol/HDL:CHD Risk Coronary Heart Disease Risk Table                     Men   Women  1/2 Average Risk   3.4   3.3  Average Risk       5.0   4.4  2 X Average Risk   9.6   7.1  3 X Average Risk  23.4   11.0        Use the calculated Patient Ratio  above and the CHD Risk Table to determine the patient's CHD Risk.        ATP III CLASSIFICATION (LDL):  <100     mg/dL   Optimal  161-096  mg/dL   Near or Above                    Optimal  130-159  mg/dL   Borderline  045-409  mg/dL   High  >811     mg/dL   Very High   CHOL  91/47/8295 04:00 AM    185        ATP III CLASSIFICATION:  <200     mg/dL   Desirable  621-308  mg/dL   Borderline High  >=657    mg/dL   High          TRIG 846 (H) 01/24/2009 04:00 AM   HDL 34 (L) 01/24/2009 04:00 AM       ASSESSMENT AND PLAN:  1. CAD with CABG: CCS class II angina. Continue ASA, metoprolol, and rosuvastatin.Unable to swallow Ranexa tablets.  I will give her a refill for sublingual  nitroglycerin.  2. Essential HTN: Controlled. No changes.  3. Hyperlipidemia: Continue statin therapy.  I will obtain a copy of lipids from her PCP.  4.  Type 2 insulin-dependent diabetes mellitus: She remains on insulin and has an insulin pump.  5.  Bilateral leg edema: Symptoms have resolved. Likely due to chronic venous insufficiency.  Echocardiogram reviewed above with normal left ventricular systolic and diastolic function.  6.  Shortness of breath after eating: This appears to be due to gastroparesis.  She is scheduled to see GI on 10/11/2017.   Disposition: Follow up 6 months   Prentice Docker, M.D., F.A.C.C.

## 2017-09-13 NOTE — Patient Instructions (Signed)

## 2017-09-14 ENCOUNTER — Other Ambulatory Visit: Payer: Self-pay | Admitting: Gastroenterology

## 2017-09-20 ENCOUNTER — Other Ambulatory Visit: Payer: Self-pay

## 2017-09-20 DIAGNOSIS — K760 Fatty (change of) liver, not elsewhere classified: Secondary | ICD-10-CM

## 2017-09-26 ENCOUNTER — Other Ambulatory Visit: Payer: Self-pay | Admitting: Gastroenterology

## 2017-09-26 DIAGNOSIS — R140 Abdominal distension (gaseous): Secondary | ICD-10-CM

## 2017-10-06 ENCOUNTER — Other Ambulatory Visit: Payer: Self-pay | Admitting: Cardiovascular Disease

## 2017-10-09 ENCOUNTER — Other Ambulatory Visit: Payer: Self-pay | Admitting: Gastroenterology

## 2017-10-11 ENCOUNTER — Ambulatory Visit: Payer: Medicare Other | Admitting: Physician Assistant

## 2017-10-29 ENCOUNTER — Ambulatory Visit (INDEPENDENT_AMBULATORY_CARE_PROVIDER_SITE_OTHER): Payer: Medicare Other | Admitting: Emergency Medicine

## 2017-10-29 ENCOUNTER — Other Ambulatory Visit: Payer: Self-pay | Admitting: Emergency Medicine

## 2017-10-29 ENCOUNTER — Encounter: Payer: Self-pay | Admitting: Emergency Medicine

## 2017-10-29 DIAGNOSIS — J45909 Unspecified asthma, uncomplicated: Secondary | ICD-10-CM

## 2017-10-29 DIAGNOSIS — I25768 Atherosclerosis of bypass graft of coronary artery of transplanted heart with other forms of angina pectoris: Secondary | ICD-10-CM

## 2017-10-29 LAB — PULMONARY FUNCTION TEST
DL/VA % PRED: 113 %
DL/VA: 5.2 ml/min/mmHg/L
DLCO UNC: 21.11 ml/min/mmHg
DLCO unc % pred: 94 %
FEF 25-75 POST: 2.58 L/s
FEF 25-75 Pre: 2.28 L/sec
FEF2575-%Change-Post: 13 %
FEF2575-%Pred-Post: 97 %
FEF2575-%Pred-Pre: 85 %
FEV1-%CHANGE-POST: 4 %
FEV1-%PRED-POST: 86 %
FEV1-%Pred-Pre: 83 %
FEV1-POST: 2.29 L
FEV1-PRE: 2.2 L
FEV1FVC-%Change-Post: -2 %
FEV1FVC-%PRED-PRE: 104 %
FEV6-%Change-Post: 6 %
FEV6-%PRED-POST: 86 %
FEV6-%PRED-PRE: 81 %
FEV6-POST: 2.8 L
FEV6-Pre: 2.63 L
FEV6FVC-%CHANGE-POST: 0 %
FEV6FVC-%PRED-PRE: 103 %
FEV6FVC-%Pred-Post: 102 %
FVC-%Change-Post: 6 %
FVC-%PRED-PRE: 78 %
FVC-%Pred-Post: 83 %
FVC-POST: 2.81 L
FVC-PRE: 2.63 L
POST FEV6/FVC RATIO: 100 %
PRE FEV6/FVC RATIO: 100 %
Post FEV1/FVC ratio: 82 %
Pre FEV1/FVC ratio: 84 %
RV % PRED: 120 %
RV: 2.07 L
TLC % pred: 101 %
TLC: 4.9 L

## 2017-10-29 NOTE — Progress Notes (Signed)
PFT done today. 

## 2017-10-29 NOTE — Assessment & Plan Note (Signed)
Pulmonary function testing consistent with restriction mixed with obstruction, likely due to obesity and some superimposed mild asthma.  We will continue Breo given her clinical response.  Albuterol use has decreased and she will keep it available to use as needed.

## 2017-10-29 NOTE — Progress Notes (Signed)
Subjective:    Patient ID: [name], female    DOB: 02-28-1966, 52 y.o.   MRN: [id number]  HPI 52 year old woman with a history of CAD/CABG, diabetes with gastroparesis, hypertension, hypothyroidism. Had childhood asthma, grew out of it. She has been followed by Dr. Jamison Neighbor and carries a diagnosis of asthma that was made in her late 59's.  Also with allergic rhinitis. She moved to a new apartment about a year ago, has had more asthma sx since that move - exertional SOB, wheezing in the am. The previous renter had a dog - allergy to pet dander. Has used albuterol nebs more frequently.  New worsening of green sinus drainage, fatigue, low appetite over the last 2 weeks.   She was tried on Symbicort last month, but she did not tolerate due to dry throat. She has singulair, but has not been taking reliably. She uses astelin NS prn, has been using this week.   ROV 10/29/17 --follow-up visit for patient with a history of minimal tobacco, childhood asthma, allergic rhinitis and possible adult asthma.  She also has coronary disease, diabetes with gastroparesis, hypertension and hypothyroidism. We changed her to [facility] last time to see if she would benefit - she has noticed some benefit, feels that it has helped her, has opened her up some. Likes it better than the Symbicort. She has been using albuterol nebs about 1-2x a week, has improved on the breo. Her PFT show mixed obstruction and restriction, mild without a bronchodilator response.  Her lung volumes are normal and her diffusion capacity is normal. She uses astelin biw. She is not on singulair and loratadine - difficulty swallowing pills.     Review of Systems  Past Medical History:  Diagnosis Date  . Allergic rhinitis   . Allergy   . Anxiety   . Asthma   . Blood transfusion without reported diagnosis    2010 CABG  . CAD (coronary artery disease)    4v CABG, 9/10; NL LVF, status post followup Cardiolite November 2012 no ischemia ejection  fraction 65%  . Cataract    are forming  . Diabetes mellitus    Scheduled to go on insulin pump  . Dyslipidemia     LDL 22 mg percent on Crestor  . Ejection fraction   . Esophageal stricture   . Gastroparesis due to DM (HCC)   . GERD (gastroesophageal reflux disease)   . Glaucoma   . History of heart attack   . Hx of CABG    September, 2010  . Hyperlipidemia   . Hypertension   . Hypothyroidism   . Myocardial infarction (HCC) 2010   2008 x 2 stenst, 209 x 2 stents. 01-2009 CABG x 4      Family History  Problem Relation Age of Onset  . Heart disease Father   . Asthma Father   . COPD Father   . Diabetes Mother   . Asthma Paternal Aunt   . Colon cancer Neg Hx   . Colon polyps Neg Hx   . Esophageal cancer Neg Hx   . Rectal cancer Neg Hx   . Stomach cancer Neg Hx      Social History   Socioeconomic History  . Marital status: Single    Spouse name: Not on file  . Number of children: 1  . Years of education: Not on file  . Highest education level: Not on file  Occupational History  . Occupation: disabilied  Social Needs  . Financial  resource strain: Not on file  . Food insecurity:    Worry: Not on file    Inability: Not on file  . Transportation needs:    Medical: Not on file    Non-medical: Not on file  Tobacco Use  . Smoking status: Former Smoker    Packs/day: 1.00    Years: 3.00    Pack years: 3.00    Types: Cigarettes    Start date: 03/14/1991    Last attempt to quit: 05/22/1993    Years since quitting: 24.4  . Smokeless tobacco: Never Used  . Tobacco comment:  Year Quit: 1995  Substance and Sexual Activity  . Alcohol use: No    Alcohol/week: 0.0 oz  . Drug use: No  . Sexual activity: Yes  Lifestyle  . Physical activity:    Days per week: Not on file    Minutes per session: Not on file  . Stress: Not on file  Relationships  . Social connections:    Talks on phone: Not on file    Gets together: Not on file    Attends religious service: Not on  file    Active member of club or organization: Not on file    Attends meetings of clubs or organizations: Not on file    Relationship status: Not on file  . Intimate partner violence:    Fear of current or ex partner: Not on file    Emotionally abused: Not on file    Physically abused: Not on file    Forced sexual activity: Not on file  Other Topics Concern  . Not on file  Social History Narrative   [facility] (03/19/17):   Originally [place]. Has always lived in [place]. Previously worked at [employer]. She is disabled due to her prior cardiac history. 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 No pets currently. No bird exposure. She reports she did have mold in a prior home that she lived in for 3 years. She has since moved to a new home. She enjoys walking.      Allergies  Allergen Reactions  . Metformin Anaphylaxis  . Penicillins Anaphylaxis and Other (See Comments)    Has patient had a PCN reaction causing immediate rash, facial/tongue/throat swelling, SOB or lightheadedness with hypotension: Yes Has patient had a PCN reaction causing severe rash involving mucus membranes or skin necrosis: No Has patient had a PCN reaction that required hospitalization No Has patient had a PCN reaction occurring within the last 10 years: No If all of the above answers are "NO", then may proceed with Cephalosporin use.  . Metoclopramide Other (See Comments)    Reaction:  Nightmares      Outpatient Medications Prior to Visit  Medication Sig Dispense Refill  . albuterol (PROVENTIL) (2.5 MG/3ML) 0.083% nebulizer solution Take 2.5 mg by nebulization every 4 (four) hours as needed for wheezing or shortness of breath.     Marland Kitchen. albuterol (VENTOLIN HFA) 108 (90 BASE) MCG/ACT inhaler Inhale 2 puffs into the lungs every 6 (six) hours as needed for wheezing or shortness of breath.     . ALPRAZolam (XANAX) 0.5 MG tablet Take 0.5 mg by mouth daily.      Marland Kitchen. aspirin 81 MG chewable tablet Chew 81 mg by mouth daily.    Marland Kitchen. azelastine (ASTELIN) 0.1 % nasal spray Place 2 sprays 2 (two) times daily into both nostrils.    Marland Kitchen. azithromycin (ZITHROMAX) 200 MG/5ML  suspension Take 250 mg by mouth daily.    . fish oil-omega-3 fatty acids 1000 MG capsule Take 2 capsules (2 g total) by mouth 2 (two) times daily.    . fluticasone furoate-vilanterol (BREO ELLIPTA) 100-25 MCG/INH AEPB Inhale 1 puff into the lungs daily. 60 each 5  . furosemide (LASIX) 40 MG tablet TAKE 1 TABLET (40 MG TOTAL) BY MOUTH DAILY. 90 tablet 6  . glucose blood (CONTOUR NEXT TEST) test strip TEST BLOOD GLUCOSE EIGHT TIMES DAILY    . hyoscyamine (LEVSIN SL) 0.125 MG SL tablet PLACE 1 TABLET (0.125 MG) UNDER THE TONGUE ONCE TO TWICE A DAY, AS NEEDED 180 tablet 1  . Insulin Human (INSULIN PUMP) SOLN Pt uses up to 150 units of NOVOLOG daily.    . lansoprazole (PREVACID SOLUTAB) 30 MG disintegrating tablet TAKE 1 TABLET THIRTY MINUTES BEFORE BREAKFAST AND THIRTY MINUTES BEFORE SUPPER 60 tablet 3  . levothyroxine (SYNTHROID, LEVOTHROID) 100 MCG tablet Take 100 mcg by mouth daily before breakfast.    . loratadine (CLARITIN) 10 MG tablet Take 1 tablet (10 mg total) by mouth daily. 30 tablet 5  . metoCLOPramide (REGLAN) 10 MG tablet Take 10 mg by mouth 4 (four) times daily.    . metoprolol tartrate (LOPRESSOR) 25 MG tablet Take 0.5 tablets (12.5 mg total) by mouth daily. Take 0.5 tablet by mouth once daily 45 tablet 3  . nitroGLYCERIN (NITROSTAT) 0.4 MG SL tablet Place 1 tablet (0.4 mg total) under the tongue every 5 (five) minutes as needed for chest pain. 25 tablet 3  . nystatin (MYCOSTATIN) 100000 UNIT/ML suspension Take 5 mLs (500,000 Units total) by mouth 4 (four) times daily. 60 mL 0  . potassium chloride (K-DUR) 10 MEQ tablet Take 1 tablet (10 mEq total) by mouth daily. 30 tablet 6  . Pseudoeph-Doxylamine-DM-APAP (NYQUIL PO) Take 30 mLs by mouth at bedtime as needed (for cough).    .  ranitidine (ZANTAC) 150 MG/10ML syrup Take 10 mLs (150 mg total) by mouth 2 (two) times daily. 300 mL 5  . rosuvastatin (CRESTOR) 20 MG tablet TAKE 1 TABLET (20 MG TOTAL) BY MOUTH DAILY. 90 tablet 1  . Spacer/Aero Chamber Mouthpiece MISC 1 Device by Does not apply route as directed. 1 each 0  . montelukast (SINGULAIR) 10 MG tablet Take 1 tablet (10 mg total) by mouth at bedtime. 30 tablet 5  . tiZANidine (ZANAFLEX) 4 MG tablet Take 2-4 mg by mouth at bedtime as needed for muscle spasms.    . budesonide-formoterol (SYMBICORT) 160-4.5 MCG/ACT inhaler Inhale 2 puffs into the lungs every 12 (twelve) hours. 1 Inhaler 0   Facility-Administered Medications Prior to Visit  Medication Dose Route Frequency Provider Last Rate Last Dose  . 0.9 %  sodium chloride infusion  500 mL Intravenous Continuous Armbruster, Willaim Rayas, MD      . 0.9 %  sodium chloride infusion  500 mL Intravenous Continuous Armbruster, Willaim Rayas, MD             Objective:   Physical Exam Vitals:   10/29/17 1459 10/29/17 1503  BP:  128/78  Pulse:  91  SpO2:  97%  Weight: 225 lb (102.1 kg)   Height: 5' 2.5" (1.588 m)    Gen: Pleasant, obese woman, in no distress,  normal affect  ENT: No lesions,  mouth clear,  oropharynx clear, no postnasal drip, poor dentition  Neck: No JVD, exp stridor  Lungs: No use of accessory muscles, some referred UA noise, some B exp  wheezes  Cardiovascular: RRR, heart sounds normal, no murmur or gallops, no peripheral edema  Musculoskeletal: No deformities, no cyanosis or clubbing  Neuro: alert, non focal  Skin: Warm, no lesions or rash      Assessment & Plan:  Asthma in adult Pulmonary function testing consistent with restriction mixed with obstruction, likely due to obesity and some superimposed mild asthma.  We will continue Breo given her clinical response.  Albuterol use has decreased and she will keep it available to use as needed.  Levy Pupa, MD, PhD 10/29/2017, 3:28  PM [facility] [phone] or if no answer [phone]

## 2017-10-29 NOTE — Patient Instructions (Signed)
Please continue Breo 1 puff once a day.  Remember to rinse and gargle after using this medication. Keep albuterol available to use 1 nebulizer treatment if needed for shortness of breath, wheezing, chest tightness. Use Astelin nasal spray if needed to treat nasal drainage. Follow with Dr Delton CoombesByrum in 6 months or sooner if you have any problems

## 2017-11-23 ENCOUNTER — Other Ambulatory Visit (INDEPENDENT_AMBULATORY_CARE_PROVIDER_SITE_OTHER): Payer: Medicare Other

## 2017-11-23 DIAGNOSIS — K760 Fatty (change of) liver, not elsewhere classified: Secondary | ICD-10-CM | POA: Diagnosis not present

## 2017-11-23 LAB — HEPATIC FUNCTION PANEL
ALK PHOS: 118 U/L — AB (ref 39–117)
ALT: 12 U/L (ref 0–35)
AST: 15 U/L (ref 0–37)
Albumin: 3.8 g/dL (ref 3.5–5.2)
BILIRUBIN DIRECT: 0 mg/dL (ref 0.0–0.3)
Total Bilirubin: 0.4 mg/dL (ref 0.2–1.2)
Total Protein: 6.7 g/dL (ref 6.0–8.3)

## 2017-12-06 ENCOUNTER — Encounter (HOSPITAL_COMMUNITY): Payer: Self-pay | Admitting: Emergency Medicine

## 2017-12-06 ENCOUNTER — Emergency Department (HOSPITAL_COMMUNITY): Payer: Medicare Other

## 2017-12-06 ENCOUNTER — Emergency Department (HOSPITAL_COMMUNITY)
Admission: EM | Admit: 2017-12-06 | Discharge: 2017-12-06 | Disposition: A | Discharge status: Home / Self Care | Payer: Medicare Other | Attending: Emergency Medicine | Admitting: Emergency Medicine

## 2017-12-06 ENCOUNTER — Other Ambulatory Visit: Payer: Self-pay

## 2017-12-06 DIAGNOSIS — Z79899 Other long term (current) drug therapy: Secondary | ICD-10-CM | POA: Insufficient documentation

## 2017-12-06 DIAGNOSIS — Z794 Long term (current) use of insulin: Secondary | ICD-10-CM | POA: Diagnosis not present

## 2017-12-06 DIAGNOSIS — E119 Type 2 diabetes mellitus without complications: Secondary | ICD-10-CM | POA: Diagnosis not present

## 2017-12-06 DIAGNOSIS — Z7982 Long term (current) use of aspirin: Secondary | ICD-10-CM | POA: Insufficient documentation

## 2017-12-06 DIAGNOSIS — Z87891 Personal history of nicotine dependence: Secondary | ICD-10-CM | POA: Diagnosis not present

## 2017-12-06 DIAGNOSIS — R079 Chest pain, unspecified: Secondary | ICD-10-CM | POA: Diagnosis present

## 2017-12-06 DIAGNOSIS — R0789 Other chest pain: Secondary | ICD-10-CM

## 2017-12-06 DIAGNOSIS — I2510 Atherosclerotic heart disease of native coronary artery without angina pectoris: Secondary | ICD-10-CM | POA: Diagnosis not present

## 2017-12-06 DIAGNOSIS — I10 Essential (primary) hypertension: Secondary | ICD-10-CM | POA: Insufficient documentation

## 2017-12-06 DIAGNOSIS — J45909 Unspecified asthma, uncomplicated: Secondary | ICD-10-CM | POA: Insufficient documentation

## 2017-12-06 HISTORY — DX: Other chest pain: R07.89

## 2017-12-06 LAB — CBC WITH DIFFERENTIAL/PLATELET
BASOS PCT: 0 %
Basophils Absolute: 0 10*3/uL (ref 0.0–0.1)
Eosinophils Absolute: 0.1 10*3/uL (ref 0.0–0.7)
Eosinophils Relative: 1 %
HEMATOCRIT: 39 % (ref 36.0–46.0)
HEMOGLOBIN: 12.4 g/dL (ref 12.0–15.0)
LYMPHS ABS: 2.2 10*3/uL (ref 0.7–4.0)
Lymphocytes Relative: 24 %
MCH: 27.2 pg (ref 26.0–34.0)
MCHC: 31.8 g/dL (ref 30.0–36.0)
MCV: 85.5 fL (ref 78.0–100.0)
MONO ABS: 0.4 10*3/uL (ref 0.1–1.0)
MONOS PCT: 4 %
NEUTROS ABS: 6.5 10*3/uL (ref 1.7–7.7)
NEUTROS PCT: 71 %
Platelets: 240 10*3/uL (ref 150–400)
RBC: 4.56 MIL/uL (ref 3.87–5.11)
RDW: 15.9 % — AB (ref 11.5–15.5)
WBC: 9.2 10*3/uL (ref 4.0–10.5)

## 2017-12-06 LAB — BASIC METABOLIC PANEL
Anion gap: 8 (ref 5–15)
BUN: 8 mg/dL (ref 6–20)
CALCIUM: 9.5 mg/dL (ref 8.9–10.3)
CHLORIDE: 107 mmol/L (ref 98–111)
CO2: 25 mmol/L (ref 22–32)
Creatinine, Ser: 0.69 mg/dL (ref 0.44–1.00)
GFR calc non Af Amer: >60 mL/min (ref >60)
GLUCOSE: 190 mg/dL — AB (ref 70–99)
Potassium: 3.9 mmol/L (ref 3.5–5.1)
Sodium: 140 mmol/L (ref 135–145)

## 2017-12-06 LAB — TROPONIN I: Troponin I: <0.03 ng/mL (ref <0.03)

## 2017-12-06 LAB — D-DIMER, QUANTITATIVE: D-Dimer, Quant: 0.34 ug/mL-FEU (ref 0.00–0.50)

## 2017-12-06 MED ORDER — HYDROMORPHONE HCL 1 MG/ML IJ SOLN
1.0000 mg | Freq: Once | INTRAMUSCULAR | Status: AC
  Administered 2017-12-06: 1 mg via INTRAMUSCULAR
  Filled 2017-12-06: qty 1

## 2017-12-06 MED ORDER — METHOCARBAMOL 500 MG PO TABS: 1000.0000 mg | ORAL_TABLET | Freq: Four times a day (QID) | ORAL | 0 refills | Status: DC | PRN

## 2017-12-06 MED ORDER — HYDROCODONE-ACETAMINOPHEN 5-325 MG PO TABS: ORAL_TABLET | ORAL | 0 refills | Status: DC

## 2017-12-06 NOTE — Discharge Instructions (Signed)
Take the prescriptions as directed.  Apply moist heat or ice to the area(s) of discomfort, for 15 minutes at a time, several times per day for the next few days.  Do not fall asleep on a heating or ice pack.  Call your regular medical doctor today to schedule a follow up appointment in the next 2 days.  Return to the Emergency Department immediately if worsening. ° °

## 2017-12-06 NOTE — ED Provider Notes (Signed)
[facility] EMERGENCY DEPARTMENT Provider Note   CSN: [id number] Arrival date & time: 12/06/17  1039     History   Chief Complaint Chief Complaint  Patient presents with  . Chest Pain    HPI [name] is a 52 y.o. female.  HPI  Pt was seen at 1115. Per pt, c/o gradual onset and persistence of constant left sided chest wall "pain" that she woke up with yesterday. Pt describes the pain as "sharp" and "spasms." Pain has been constant since onset. Pain worsens with deep breath, palpation of the area, and movement of her arm/torso. Pt states she has hx of similar symptoms, rx muscle relaxants. Pt states she "lays on that side a lot." Denies palpitations, no SOB/cough, no abd pain, no N/V/D, no rash, no fevers, no injury.   Past Medical History:  Diagnosis Date  . Allergic rhinitis   . Allergy   . Anxiety   . Asthma   . Blood transfusion without reported diagnosis    2010 CABG  . CAD (coronary artery disease)    4v CABG, 9/10; NL LVF, status post followup Cardiolite November 2012 no ischemia ejection fraction 65%  . Cataract    are forming  . Diabetes mellitus    Scheduled to go on insulin pump  . Dyslipidemia     LDL 22 mg percent on Crestor  . Ejection fraction   . Esophageal stricture   . Gastroparesis due to DM (HCC)   . GERD (gastroesophageal reflux disease)   . Glaucoma   . History of heart attack   . Hx of CABG    September, 2010  . Hyperlipidemia   . Hypertension   . Hypothyroidism   . Left-sided chest wall pain   . Myocardial infarction (HCC) 2010   2008 x 2 stenst, 209 x 2 stents. 01-2009 CABG x 4     Patient Active Problem List   Diagnosis Date Noted  . Acute bronchitis 07/18/2017  . Oral thrush 04/19/2017  . Chronic seasonal allergic rhinitis 03/19/2017  . Diabetes mellitus with complication (HCC)   . Atypical chest pain   . Muscle spasm 08/20/2014  . Obesity, unspecified 08/03/2013  . Hx of CABG   . Ejection fraction   . HTN (hypertension)  08/22/2012  . Hypothyroidism 09/29/2011  . Edema 08/25/2011  . Gastroparesis 09/07/2010  . Hyperlipidemia 02/21/2010  . Muscle spasm of left shoulder 04/29/2009  . PALPITATIONS 04/12/2009  . WHEEZING 04/12/2009  . GERD 01/19/2009  . Chest pain 02/04/2008  . Diabetes mellitus type 2, insulin dependent (HCC) 06/12/2007  . Anxiety disorder 06/12/2007  . Coronary artery disease involving coronary bypass graft of native heart 06/12/2007  . Asthma in adult 06/12/2007    Past Surgical History:  Procedure Laterality Date  . ANGIOPLASTY     with stenting [id number]  . CARDIAC SURGERY    . CESAREAN SECTION    . CORONARY ARTERY BYPASS GRAFT  01/26/2009  . EYE SURGERY    . open heart surgery   2010  . TUBAL LIGATION       OB History   None      Home Medications    Prior to Admission medications   Medication Sig Start Date End Date Taking? Authorizing Provider  albuterol (PROVENTIL) (2.5 MG/3ML) 0.083% nebulizer solution Take 2.5 mg by nebulization every 4 (four) hours as needed for wheezing or shortness of breath.     [provider]  albuterol (VENTOLIN HFA) 108 (90 BASE) MCG/ACT  inhaler Inhale 2 puffs into the lungs every 6 (six) hours as needed for wheezing or shortness of breath.     [provider]  ALPRAZolam Prudy Feeler) 0.25 MG tablet Take 0.25 mg by mouth 2 (two) times daily as needed. 11/21/17   [provider]  ALPRAZolam Prudy Feeler) 0.5 MG tablet Take 0.5 mg by mouth daily.     [provider]  aspirin 81 MG chewable tablet Chew 81 mg by mouth daily.    [provider]  azelastine (ASTELIN) 0.1 % nasal spray Place 2 sprays 2 (two) times daily into both nostrils. 04/03/17   [provider]  azithromycin (ZITHROMAX) 200 MG/5ML suspension Take 250 mg by mouth daily.    [provider]  fish oil-omega-3 fatty acids 1000 MG capsule Take 2 capsules (2 g total) by mouth 2 (two) times daily. 07/03/12   Serpe, Clide Deutscher, PA-C    fluticasone furoate-vilanterol (BREO ELLIPTA) 100-25 MCG/INH AEPB Inhale 1 puff into the lungs daily. 07/18/17   Leslye Peer, MD  furosemide (LASIX) 40 MG tablet TAKE 1 TABLET (40 MG TOTAL) BY MOUTH DAILY. 06/06/17   Laqueta Linden, MD  glucose blood (CONTOUR NEXT TEST) test strip TEST BLOOD GLUCOSE EIGHT TIMES DAILY 09/26/16   [provider]  hyoscyamine (LEVSIN SL) 0.125 MG SL tablet PLACE 1 TABLET (0.125 MG) UNDER THE TONGUE ONCE TO TWICE A DAY, AS NEEDED 09/27/17   Unk Lightning, PA  Insulin Human (INSULIN PUMP) SOLN Pt uses up to 150 units of NOVOLOG daily.    [provider]  lansoprazole (PREVACID SOLUTAB) 30 MG disintegrating tablet TAKE 1 TABLET THIRTY MINUTES BEFORE BREAKFAST AND THIRTY MINUTES BEFORE SUPPER 10/09/17   Unk Lightning, PA  levothyroxine (SYNTHROID, LEVOTHROID) 100 MCG tablet Take 100 mcg by mouth daily before breakfast.    [provider]  loratadine (CLARITIN) 10 MG tablet Take 1 tablet (10 mg total) by mouth daily. 07/18/17   Leslye Peer, MD  metoCLOPramide (REGLAN) 10 MG tablet Take 10 mg by mouth 4 (four) times daily.    [provider]  metoprolol tartrate (LOPRESSOR) 25 MG tablet Take 0.5 tablets (12.5 mg total) by mouth daily. Take 0.5 tablet by mouth once daily 03/22/17   Laqueta Linden, MD  nitroGLYCERIN (NITROSTAT) 0.4 MG SL tablet Place 1 tablet (0.4 mg total) under the tongue every 5 (five) minutes as needed for chest pain. 09/13/17   Laqueta Linden, MD  NOVOLOG 100 UNIT/ML injection Inject 100-150 Units into the skin as directed. Use up to 100-150 units daily in insulin pump as needed 11/23/17   [provider]  nystatin (MYCOSTATIN) 100000 UNIT/ML suspension Take 5 mLs (500,000 Units total) by mouth 4 (four) times daily. 04/19/17   Roslynn Amble, MD  oxyCODONE-acetaminophen (PERCOCET/ROXICET) 5-325 MG tablet Take 1 tablet by mouth 3 (three) times daily as needed. 11/23/17   [provider]  potassium chloride (K-DUR) 10 MEQ tablet Take 1 tablet (10 mEq total) by mouth daily. 06/14/17   Laqueta Linden, MD  Pseudoeph-Doxylamine-DM-APAP (NYQUIL PO) Take 30 mLs by mouth at bedtime as needed (for cough).    [provider]  ranitidine (ZANTAC) 150 MG/10ML syrup Take 10 mLs (150 mg total) by mouth 2 (two) times daily. 08/30/17   Unk Lightning, PA  rosuvastatin (CRESTOR) 20 MG tablet TAKE 1 TABLET (20 MG TOTAL) BY MOUTH DAILY. 10/08/17   Laqueta Linden, MD  Spacer/Aero Chamber Mouthpiece MISC 1  Device by Does not apply route as directed. 03/19/17   Roslynn Amble, MD  TRINTELLIX 10 MG TABS tablet Take 10 mg by mouth daily. 11/29/17   [provider]    Family History Family History  Problem Relation Age of Onset  . Heart disease Father   . Asthma Father   . COPD Father   . Diabetes Mother   . Asthma Paternal Aunt   . Colon cancer Neg Hx   . Colon polyps Neg Hx   . Esophageal cancer Neg Hx   . Rectal cancer Neg Hx   . Stomach cancer Neg Hx     Social History Social History   Tobacco Use  . Smoking status: Former Smoker    Packs/day: 1.00    Years: 3.00    Pack years: 3.00    Types: Cigarettes    Start date: 03/14/1991    Last attempt to quit: 05/22/1993    Years since quitting: 24.5  . Smokeless tobacco: Never Used  . Tobacco comment:  Year Quit: 1995  Substance Use Topics  . Alcohol use: No    Alcohol/week: 0.0 oz  . Drug use: No     Allergies   Metformin and Penicillins   Review of Systems Review of Systems ROS: Statement: All systems negative except as marked or noted in the HPI; Constitutional: Negative for fever and chills. ; ; Eyes: Negative for eye pain, redness and discharge. ; ; ENMT: Negative for ear pain, hoarseness, nasal congestion, sinus pressure and sore throat. ; ; Cardiovascular: Negative for palpitations, diaphoresis, dyspnea and peripheral edema. ; ; Respiratory: Negative for cough,  wheezing and stridor. ; ; Gastrointestinal: Negative for nausea, vomiting, diarrhea, abdominal pain, blood in stool, hematemesis, jaundice and rectal bleeding. . ; ; Genitourinary: Negative for dysuria, flank pain and hematuria. ; ; Musculoskeletal: +chest wall pain. Negative for back pain and neck pain. Negative for swelling and trauma.; ; Skin: Negative for pruritus, rash, abrasions, blisters, bruising and skin lesion.; ; Neuro: Negative for headache, lightheadedness and neck stiffness. Negative for weakness, altered level of consciousness, altered mental status, extremity weakness, paresthesias, involuntary movement, seizure and syncope.       Physical Exam Updated Vital Signs BP (!) 144/74   Pulse 65   Temp 98.2 F (36.8 C) (Oral)   Resp (!) 22   Ht 5\' 4"  (1.626 m)   Wt 102.1 kg (225 lb)   LMP 12/06/2017   SpO2 97%   BMI 38.62 kg/m   Physical Exam 1120: Physical examination:  Nursing notes reviewed; Vital signs and O2 SAT reviewed;  Constitutional: Well developed, Well nourished, Well hydrated, In no acute distress; Head:  Normocephalic, atraumatic; Eyes: EOMI, PERRL, No scleral icterus; ENMT: Mouth and pharynx normal, Mucous membranes moist; Neck: Supple, Full range of motion, No lymphadenopathy; Cardiovascular: Regular rate and rhythm, No gallop; Respiratory: Breath sounds clear & equal bilaterally, No wheezes.  Speaking full sentences with ease, Normal respiratory effort/excursion; Chest: +left anterior and lateral chest wall tender to palp. No deformity, no soft tissue crepitus. Movement normal; Abdomen: Soft, Nontender, Nondistended, Normal bowel sounds; Genitourinary: No CVA tenderness; Extremities: Peripheral pulses normal, No tenderness, No edema, No calf edema or asymmetry.; Neuro: AA&Ox3, Major CN grossly intact.  Speech clear. No gross focal motor or sensory deficits in extremities.; Skin: Color normal, Warm, Dry.   ED Treatments / Results  Labs (all labs ordered are listed,  but only abnormal results are displayed)   EKG EKG Interpretation  Date/Time:  Thursday December 06 2017 11:09:12 EDT Ventricular Rate:  72 PR Interval:    QRS Duration: 104 QT Interval:  401 QTC Calculation: 439 R Axis:   67 Text Interpretation:  Sinus rhythm Abnormal T, consider ischemia, lateral leads When compared with ECG of 04/09/2017 No significant change was found Confirmed by Samuel Jester [phone]) on 12/06/2017 11:33:21 AM   Radiology   Procedures Procedures (including critical care time)  Medications Ordered in ED Medications  HYDROmorphone (DILAUDID) injection 1 mg (1 mg Intramuscular Given 12/06/17 1131)     Initial Impression / Assessment and Plan / ED Course  I have reviewed the triage vital signs and the nursing notes.  Pertinent labs & imaging results that were available during my care of the patient were reviewed by me and considered in my medical decision making (see chart for details).  MDM Reviewed: previous chart, nursing note and vitals Reviewed previous: labs and ECG Interpretation: labs, ECG and x-ray   Results for orders placed or performed during the hospital encounter of 12/06/17  Troponin I  Result Value Ref Range   Troponin I <0.03 <0.03 ng/mL  D-dimer, quantitative  Result Value Ref Range   D-Dimer, Quant 0.34 0.00 - 0.50 ug/mL-FEU  CBC with Differential  Result Value Ref Range   WBC 9.2 4.0 - 10.5 K/uL   RBC 4.56 3.87 - 5.11 MIL/uL   Hemoglobin 12.4 12.0 - 15.0 g/dL   HCT 19.1 47.8 - 29.5 %   MCV 85.5 78.0 - 100.0 fL   MCH 27.2 26.0 - 34.0 pg   MCHC 31.8 30.0 - 36.0 g/dL   RDW 62.1 (H) 30.8 - 65.7 %   Platelets 240 150 - 400 K/uL   Neutrophils Relative % 71 %   Neutro Abs 6.5 1.7 - 7.7 K/uL   Lymphocytes Relative 24 %   Lymphs Abs 2.2 0.7 - 4.0 K/uL   Monocytes Relative 4 %   Monocytes Absolute 0.4 0.1 - 1.0 K/uL   Eosinophils Relative 1 %   Eosinophils Absolute 0.1 0.0 - 0.7 K/uL   Basophils Relative 0 %   Basophils  Absolute 0.0 0.0 - 0.1 K/uL  Basic metabolic panel  Result Value Ref Range   Sodium 140 135 - 145 mmol/L   Potassium 3.9 3.5 - 5.1 mmol/L   Chloride 107 98 - 111 mmol/L   CO2 25 22 - 32 mmol/L   Glucose, Bld 190 (H) 70 - 99 mg/dL   BUN 8 6 - 20 mg/dL   Creatinine, Ser 8.46 0.44 - 1.00 mg/dL   Calcium 9.5 8.9 - 96.2 mg/dL   GFR calc non Af Amer >60 >60 mL/min   GFR calc Af Amer >60 >60 mL/min   Anion gap 8 5 - 15   Dg Chest 2 View Result Date: 12/06/2017 CLINICAL DATA:  Intermittent left chest wall pain.  No injury. EXAM: CHEST - 2 VIEW COMPARISON:  Chest x-ray dated April 09, 2017. FINDINGS: The heart size and mediastinal contours are within normal limits. Prior CABG. Normal pulmonary vascularity. No focal consolidation, pleural effusion, or pneumothorax. No acute osseous abnormality. IMPRESSION: No active cardiopulmonary disease. Electronically Signed   By: Obie Dredge M.D.   On: 12/06/2017 13:26    1355:  Doubt PE as cause for symptoms with normal d-dimer and low risk Wells.  Doubt ACS as cause for symptoms with normal troponin and unchanged EKG from previous after 2 days of constant symptoms. Tx symptomatically at this time. Dx and testing d/w pt  and family.  Questions answered.  Verb understanding, agreeable to d/c home with outpt f/u.    Final Clinical Impressions(s) / ED Diagnoses   Final diagnoses:  None    ED Discharge Orders    None       Samuel JesterMcManus, Dayonna Selbe, DO 12/09/17 1750

## 2017-12-06 NOTE — ED Triage Notes (Signed)
Pain to lt chest and under LT axilla during movement since yesterday.

## 2017-12-24 ENCOUNTER — Other Ambulatory Visit: Payer: Self-pay

## 2017-12-24 ENCOUNTER — Encounter (HOSPITAL_COMMUNITY): Payer: Self-pay | Admitting: Emergency Medicine

## 2017-12-24 ENCOUNTER — Emergency Department (HOSPITAL_COMMUNITY)
Admission: EM | Admit: 2017-12-24 | Discharge: 2017-12-24 | Disposition: A | Discharge status: Home / Self Care | Payer: Medicare Other | Attending: Emergency Medicine | Admitting: Emergency Medicine

## 2017-12-24 DIAGNOSIS — Z87891 Personal history of nicotine dependence: Secondary | ICD-10-CM | POA: Diagnosis not present

## 2017-12-24 DIAGNOSIS — Z794 Long term (current) use of insulin: Secondary | ICD-10-CM | POA: Diagnosis not present

## 2017-12-24 DIAGNOSIS — J45909 Unspecified asthma, uncomplicated: Secondary | ICD-10-CM | POA: Insufficient documentation

## 2017-12-24 DIAGNOSIS — E039 Hypothyroidism, unspecified: Secondary | ICD-10-CM | POA: Insufficient documentation

## 2017-12-24 DIAGNOSIS — W57XXXA Bitten or stung by nonvenomous insect and other nonvenomous arthropods, initial encounter: Secondary | ICD-10-CM | POA: Diagnosis not present

## 2017-12-24 DIAGNOSIS — I2510 Atherosclerotic heart disease of native coronary artery without angina pectoris: Secondary | ICD-10-CM | POA: Diagnosis not present

## 2017-12-24 DIAGNOSIS — Y929 Unspecified place or not applicable: Secondary | ICD-10-CM | POA: Diagnosis not present

## 2017-12-24 DIAGNOSIS — Y999 Unspecified external cause status: Secondary | ICD-10-CM | POA: Diagnosis not present

## 2017-12-24 DIAGNOSIS — I10 Essential (primary) hypertension: Secondary | ICD-10-CM | POA: Insufficient documentation

## 2017-12-24 DIAGNOSIS — Y939 Activity, unspecified: Secondary | ICD-10-CM | POA: Insufficient documentation

## 2017-12-24 DIAGNOSIS — Z79899 Other long term (current) drug therapy: Secondary | ICD-10-CM | POA: Diagnosis not present

## 2017-12-24 DIAGNOSIS — E119 Type 2 diabetes mellitus without complications: Secondary | ICD-10-CM | POA: Insufficient documentation

## 2017-12-24 DIAGNOSIS — Z7982 Long term (current) use of aspirin: Secondary | ICD-10-CM | POA: Diagnosis not present

## 2017-12-24 DIAGNOSIS — S90561A Insect bite (nonvenomous), right ankle, initial encounter: Secondary | ICD-10-CM | POA: Insufficient documentation

## 2017-12-24 LAB — CBG MONITORING, ED: GLUCOSE-CAPILLARY: 202 mg/dL — AB (ref 70–99)

## 2017-12-24 MED ORDER — MUPIROCIN CALCIUM 2 % EX CREA: 1.0000 "application " | TOPICAL_CREAM | Freq: Two times a day (BID) | CUTANEOUS | 0 refills | Status: AC

## 2017-12-24 MED ORDER — SULFAMETHOXAZOLE-TRIMETHOPRIM 800-160 MG PO TABS: 1.0000 | ORAL_TABLET | Freq: Two times a day (BID) | ORAL | 0 refills | Status: AC

## 2017-12-24 NOTE — ED Triage Notes (Signed)
Pt noticed wound to right lower lateral ankle x 1 week. Dime size closed wound with redness around noted.

## 2017-12-24 NOTE — Discharge Instructions (Addendum)
You have an infected insect bite,  and possibly impetigo as discussed.  Use the oral and the topical antibiotics prescribed.  Complete a warm water soak twice daily, then dry your skin before applying the antibiotic ointment.  Keep it covered if out where it may become contaminated.

## 2017-12-24 NOTE — ED Provider Notes (Signed)
[facility] EMERGENCY DEPARTMENT Provider Note   CSN: [id number] Arrival date & time: 12/24/17  1514     History   Chief Complaint Chief Complaint  Patient presents with  . Wound Infection    HPI [name] is a 52 y.o. female with a past medical history as outlined below including diabetes, last CBG was 200 presenting with a wound to her right lower ankle.  She reports being outdoors in the evening 1 week ago and sustained multiple mosquito bites.  After this time this site became increasingly tender, raised with expanding surrounding redness.  There has been a small amount of clear drainage from the wound site.  She has been applying Neosporin ointment without improvement in symptoms.  She denies fevers or chills, no radiation of pain.  She has also applied warm compresses to the site.  The history is provided by the patient.    Past Medical History:  Diagnosis Date  . Allergic rhinitis   . Allergy   . Anxiety   . Asthma   . Blood transfusion without reported diagnosis    2010 CABG  . CAD (coronary artery disease)    4v CABG, 9/10; NL LVF, status post followup Cardiolite November 2012 no ischemia ejection fraction 65%  . Cataract    are forming  . Diabetes mellitus    Scheduled to go on insulin pump  . Dyslipidemia     LDL 22 mg percent on Crestor  . Ejection fraction   . Esophageal stricture   . Gastroparesis due to DM (HCC)   . GERD (gastroesophageal reflux disease)   . Glaucoma   . History of heart attack   . Hx of CABG    September, 2010  . Hyperlipidemia   . Hypertension   . Hypothyroidism   . Left-sided chest wall pain   . Myocardial infarction (HCC) 2010   2008 x 2 stenst, 209 x 2 stents. 01-2009 CABG x 4     Patient Active Problem List   Diagnosis Date Noted  . Acute bronchitis 07/18/2017  . Oral thrush 04/19/2017  . Chronic seasonal allergic rhinitis 03/19/2017  . Diabetes mellitus with complication (HCC)   . Atypical chest pain   . Muscle  spasm 08/20/2014  . Obesity, unspecified 08/03/2013  . Hx of CABG   . Ejection fraction   . HTN (hypertension) 08/22/2012  . Hypothyroidism 09/29/2011  . Edema 08/25/2011  . Gastroparesis 09/07/2010  . Hyperlipidemia 02/21/2010  . Muscle spasm of left shoulder 04/29/2009  . PALPITATIONS 04/12/2009  . WHEEZING 04/12/2009  . GERD 01/19/2009  . Chest pain 02/04/2008  . Diabetes mellitus type 2, insulin dependent (HCC) 06/12/2007  . Anxiety disorder 06/12/2007  . Coronary artery disease involving coronary bypass graft of native heart 06/12/2007  . Asthma in adult 06/12/2007    Past Surgical History:  Procedure Laterality Date  . ANGIOPLASTY     with stenting [id number],2009  . CARDIAC SURGERY    . CESAREAN SECTION    . CORONARY ARTERY BYPASS GRAFT  01/26/2009  . EYE SURGERY    . open heart surgery   2010  . TUBAL LIGATION       OB History   None      Home Medications    Prior to Admission medications   Medication Sig Start Date End Date Taking? Authorizing Provider  albuterol (PROVENTIL) (2.5 MG/3ML) 0.083% nebulizer solution Take 2.5 mg by nebulization every 4 (four) hours as needed for wheezing or shortness of  breath.     [provider]  albuterol (VENTOLIN HFA) 108 (90 BASE) MCG/ACT inhaler Inhale 2 puffs into the lungs every 6 (six) hours as needed for wheezing or shortness of breath.     [provider]  ALPRAZolam Prudy Feeler) 0.25 MG tablet Take 0.25 mg by mouth 2 (two) times daily as needed. 11/21/17   [provider]  ALPRAZolam Prudy Feeler) 0.5 MG tablet Take 0.5 mg by mouth daily.     [provider]  aspirin 81 MG chewable tablet Chew 81 mg by mouth daily.    [provider]  azelastine (ASTELIN) 0.1 % nasal spray Place 2 sprays 2 (two) times daily into both nostrils. 04/03/17   [provider]  azithromycin (ZITHROMAX) 200 MG/5ML suspension Take 250 mg by mouth daily.    [provider]  fish oil-omega-3 fatty acids  1000 MG capsule Take 2 capsules (2 g total) by mouth 2 (two) times daily. 07/03/12   Serpe, Clide Deutscher, PA-C  fluticasone furoate-vilanterol (BREO ELLIPTA) 100-25 MCG/INH AEPB Inhale 1 puff into the lungs daily. 07/18/17   Leslye Peer, MD  furosemide (LASIX) 40 MG tablet TAKE 1 TABLET (40 MG TOTAL) BY MOUTH DAILY. 06/06/17   Laqueta Linden, MD  glucose blood (CONTOUR NEXT TEST) test strip TEST BLOOD GLUCOSE EIGHT TIMES DAILY 09/26/16   [provider]  HYDROcodone-acetaminophen (NORCO/VICODIN) 5-325 MG tablet 1 or 2 tabs PO q8 hours prn pain 12/06/17   Samuel Jester, DO  hyoscyamine (LEVSIN SL) 0.125 MG SL tablet PLACE 1 TABLET (0.125 MG) UNDER THE TONGUE ONCE TO TWICE A DAY, AS NEEDED 09/27/17   Unk Lightning, PA  Insulin Human (INSULIN PUMP) SOLN Pt uses up to 150 units of NOVOLOG daily.    [provider]  lansoprazole (PREVACID SOLUTAB) 30 MG disintegrating tablet TAKE 1 TABLET THIRTY MINUTES BEFORE BREAKFAST AND THIRTY MINUTES BEFORE SUPPER 10/09/17   Unk Lightning, PA  levothyroxine (SYNTHROID, LEVOTHROID) 100 MCG tablet Take 100 mcg by mouth daily before breakfast.    [provider]  loratadine (CLARITIN) 10 MG tablet Take 1 tablet (10 mg total) by mouth daily. 07/18/17   Leslye Peer, MD  methocarbamol (ROBAXIN) 500 MG tablet Take 2 tablets (1,000 mg total) by mouth 4 (four) times daily as needed for muscle spasms (muscle spasm/pain). 12/06/17   Samuel Jester, DO  metoCLOPramide (REGLAN) 10 MG tablet Take 10 mg by mouth 4 (four) times daily.    [provider]  metoprolol tartrate (LOPRESSOR) 25 MG tablet Take 0.5 tablets (12.5 mg total) by mouth daily. Take 0.5 tablet by mouth once daily 03/22/17   Laqueta Linden, MD  mupirocin cream (BACTROBAN) 2 % Apply 1 application topically 2 (two) times daily for 10 days. 12/24/17 01/03/18  Burgess Amor, PA-C  nitroGLYCERIN (NITROSTAT) 0.4 MG SL tablet Place 1 tablet (0.4 mg total) under the  tongue every 5 (five) minutes as needed for chest pain. 09/13/17   Laqueta Linden, MD  NOVOLOG 100 UNIT/ML injection Inject 100-150 Units into the skin as directed. Use up to 100-150 units daily in insulin pump as needed 11/23/17   [provider]  nystatin (MYCOSTATIN) 100000 UNIT/ML suspension Take 5 mLs (500,000 Units total) by mouth 4 (four) times daily. 04/19/17   Roslynn Amble, MD  oxyCODONE-acetaminophen (PERCOCET/ROXICET) 5-325 MG tablet Take 1 tablet by mouth 3 (three) times daily as needed. 11/23/17   [provider]  potassium chloride (K-DUR) 10 MEQ tablet  Take 1 tablet (10 mEq total) by mouth daily. 06/14/17   Laqueta Linden, MD  Pseudoeph-Doxylamine-DM-APAP (NYQUIL PO) Take 30 mLs by mouth at bedtime as needed (for cough).    [provider]  ranitidine (ZANTAC) 150 MG/10ML syrup Take 10 mLs (150 mg total) by mouth 2 (two) times daily. 08/30/17   Unk Lightning, PA  rosuvastatin (CRESTOR) 20 MG tablet TAKE 1 TABLET (20 MG TOTAL) BY MOUTH DAILY. 10/08/17   Laqueta Linden, MD  Spacer/Aero Chamber Mouthpiece MISC 1 Device by Does not apply route as directed. 03/19/17   Roslynn Amble, MD  sulfamethoxazole-trimethoprim (BACTRIM DS,SEPTRA DS) 800-160 MG tablet Take 1 tablet by mouth 2 (two) times daily for 10 days. 12/24/17 01/03/18  Kynzee Devinney, Raynelle Fanning, PA-C  TRINTELLIX 10 MG TABS tablet Take 10 mg by mouth daily. 11/29/17   [provider]    Family History Family History  Problem Relation Age of Onset  . Heart disease Father   . Asthma Father   . COPD Father   . Diabetes Mother   . Asthma Paternal Aunt   . Colon cancer Neg Hx   . Colon polyps Neg Hx   . Esophageal cancer Neg Hx   . Rectal cancer Neg Hx   . Stomach cancer Neg Hx     Social History Social History   Tobacco Use  . Smoking status: Former Smoker    Packs/day: 1.00    Years: 3.00    Pack years: 3.00    Types: Cigarettes    Start date: 03/14/1991    Last  attempt to quit: 05/22/1993    Years since quitting: 24.6  . Smokeless tobacco: Never Used  . Tobacco comment:  Year Quit: 1995  Substance Use Topics  . Alcohol use: No    Alcohol/week: 0.0 oz  . Drug use: No     Allergies   Metformin and Penicillins   Review of Systems Review of Systems  Constitutional: Negative for chills and fever.  Respiratory: Negative for shortness of breath and wheezing.   Skin: Positive for color change and wound.  Neurological: Negative for numbness.     Physical Exam Updated Vital Signs BP 123/63 (BP Location: Right Arm)   Pulse 66   Temp (!) 97.2 F (36.2 C) (Temporal)   Resp 16   Ht 5\' 4"  (1.626 m)   Wt 102.1 kg (225 lb)   LMP 12/06/2017   SpO2 99%   BMI 38.62 kg/m   Physical Exam  Constitutional: She is oriented to person, place, and time. She appears well-developed and well-nourished.  HENT:  Head: Normocephalic.  Cardiovascular: Normal rate.  Pulmonary/Chest: Effort normal.  Musculoskeletal: She exhibits no tenderness.  Neurological: She is alert and oriented to person, place, and time. No sensory deficit.  Skin: There is erythema.  Patient has a 2 cm raised central scab with a hint of honey crusted dried drainage of around its periphery.  There is an additional 2 cm surrounding erythema surrounding the scab.  This is located at her right lateral ankle.  There is no red streaking.  There is no spreading induration and no fluctuance.      ED Treatments / Results  Labs (all labs ordered are listed, but only abnormal results are displayed) Labs Reviewed  CBG MONITORING, ED - Abnormal; Notable for the following components:      Result Value   Glucose-Capillary 202 (*)    All other components within normal limits    EKG  None  Radiology No results found.  Procedures Procedures (including critical care time)  Medications Ordered in ED Medications - No data to display   Initial Impression / Assessment and Plan / ED Course   I have reviewed the triage vital signs and the nursing notes.  Pertinent labs & imaging results that were available during my care of the patient were reviewed by me and considered in my medical decision making (see chart for details).     Patient with probable infected mosquito bite, somewhat resembling impetigo.  She will be treated with both oral and topical antibiotics.  Advised continued warm soaks, followed by application of mupirocin.  Bactrim twice daily.  Plan follow-up with her PCP for recheck in 1 week if not improving or for any worsening symptoms.  Final Clinical Impressions(s) / ED Diagnoses   Final diagnoses:  Bug bite with infection, initial encounter    ED Discharge Orders        Ordered    mupirocin cream (BACTROBAN) 2 %  2 times daily     12/24/17 1657    sulfamethoxazole-trimethoprim (BACTRIM DS,SEPTRA DS) 800-160 MG tablet  2 times daily     12/24/17 1657       Victoriano Lain 12/24/17 1824    Mesner, Barbara Cower, MD 12/24/17 [phone]

## 2017-12-30 ENCOUNTER — Other Ambulatory Visit: Payer: Self-pay | Admitting: Emergency Medicine

## 2018-01-09 ENCOUNTER — Other Ambulatory Visit: Payer: Self-pay | Admitting: Physician Assistant

## 2018-01-10 ENCOUNTER — Other Ambulatory Visit: Payer: Self-pay | Admitting: Emergency Medicine

## 2018-01-22 ENCOUNTER — Other Ambulatory Visit: Payer: Self-pay | Admitting: *Deleted

## 2018-02-19 ENCOUNTER — Telehealth: Payer: Self-pay | Admitting: *Deleted

## 2018-02-19 NOTE — Telephone Encounter (Signed)
Crestor is making her whole body & muscles ache in arms and legs.  Feels like she can't hardly move.  Stated that she did stop medication for about 2 months to see if this medication was the cause.  Felt better off.  Now since she has went back on - same symptoms coming back.

## 2018-02-19 NOTE — Telephone Encounter (Signed)
Please obtain a copy of lipids from PCP.  Let us try reducing the dose of rosuvastatin to 10 mg to see if that helps.  After doing so, repeat lipids in 3 months.

## 2018-02-20 ENCOUNTER — Encounter: Payer: Self-pay | Admitting: *Deleted

## 2018-02-20 MED ORDER — ROSUVASTATIN CALCIUM 20 MG PO TABS: 10.0000 mg | ORAL_TABLET | Freq: Every day | ORAL | Status: DC

## 2018-02-20 NOTE — Telephone Encounter (Signed)
Patient notified and verbalized understanding.  Copy of labs requested from pmd.  She will cut the 20mg  tab Crestor in half for now.  She will call if not able to tolerate this dose.  Will mail letter when time to repeat lab.

## 2018-03-12 ENCOUNTER — Other Ambulatory Visit: Payer: Self-pay | Admitting: Cardiovascular Disease

## 2018-04-08 ENCOUNTER — Other Ambulatory Visit: Payer: Self-pay | Admitting: Cardiovascular Disease

## 2018-04-19 ENCOUNTER — Other Ambulatory Visit: Payer: Self-pay | Admitting: Gastroenterology

## 2018-04-23 ENCOUNTER — Ambulatory Visit: Payer: Medicare Other | Admitting: Cardiovascular Disease

## 2018-05-08 ENCOUNTER — Other Ambulatory Visit: Payer: Self-pay | Admitting: Gastroenterology

## 2018-05-09 ENCOUNTER — Other Ambulatory Visit: Payer: Self-pay | Admitting: Gastroenterology

## 2018-06-13 ENCOUNTER — Other Ambulatory Visit: Payer: Self-pay | Admitting: *Deleted

## 2018-06-13 ENCOUNTER — Encounter: Payer: Self-pay | Admitting: *Deleted

## 2018-06-13 DIAGNOSIS — E785 Hyperlipidemia, unspecified: Secondary | ICD-10-CM

## 2018-06-24 ENCOUNTER — Other Ambulatory Visit: Payer: Self-pay | Admitting: Emergency Medicine

## 2018-07-22 ENCOUNTER — Ambulatory Visit (INDEPENDENT_AMBULATORY_CARE_PROVIDER_SITE_OTHER): Payer: Medicare Other | Admitting: Cardiovascular Disease

## 2018-07-22 ENCOUNTER — Encounter: Payer: Self-pay | Admitting: Cardiovascular Disease

## 2018-07-22 VITALS — BP 128/60 | HR 71 | Ht 64.0 in | Wt 242.0 lb

## 2018-07-22 DIAGNOSIS — I25708 Atherosclerosis of coronary artery bypass graft(s), unspecified, with other forms of angina pectoris: Secondary | ICD-10-CM | POA: Diagnosis not present

## 2018-07-22 DIAGNOSIS — E118 Type 2 diabetes mellitus with unspecified complications: Secondary | ICD-10-CM | POA: Diagnosis not present

## 2018-07-22 DIAGNOSIS — Z794 Long term (current) use of insulin: Secondary | ICD-10-CM

## 2018-07-22 DIAGNOSIS — I10 Essential (primary) hypertension: Secondary | ICD-10-CM

## 2018-07-22 DIAGNOSIS — E785 Hyperlipidemia, unspecified: Secondary | ICD-10-CM

## 2018-07-22 MED ORDER — BISOPROLOL FUMARATE 5 MG PO TABS: 2.5000 mg | ORAL_TABLET | Freq: Every day | ORAL | 6 refills | Status: DC

## 2018-07-22 MED ORDER — ROSUVASTATIN CALCIUM 5 MG PO TABS: 5.0000 mg | ORAL_TABLET | Freq: Every day | ORAL | 6 refills | Status: DC

## 2018-07-22 NOTE — Patient Instructions (Addendum)
Medication Instructions:   Decrease Crestor to 5mg  daily.   Stop Lopressor.  Begin Bisoprolo 2.5mg  daily.   Continue all other medications.    Labwork:  Lipids - order given today.  Please do in 2 months (around 09/21/2018).  Reminder:  Nothing to eat or drink after 12 midnight prior to labs.  Office will contact with results via phone or letter.    Follow-Up: Your physician wants you to follow up in: 6 months.  You will receive a reminder letter in the mail one-two months in advance.  If you don't receive a letter, please call our office to schedule the follow up appointment   Any Other Special Instructions Will Be Listed Below (If Applicable). Please call the office in a month to update [place] on symptoms.     If you need a refill on your cardiac medications before your next appointment, please call your pharmacy.

## 2018-07-22 NOTE — Progress Notes (Signed)
SUBJECTIVE: The patient presents for follow-up of coronary artery disease and history of CABG. She underwent coronary angiography in 2014 and all of her bypass grafts were patent at that time. She also has a history of left sided chest wall muscle spasm which responds to muscle relaxants.  Most recent echocardiogram performed on 06/14/2017 showed normal left ventricular systolic and diastolic function, LVEF 60 to 53%.  She denies chest pain and has not had to use nitroglycerin recently.  She experiences myalgias with rosuvastatin 20 mg.  She has been having issues with asthma and is due to follow-up with pulmonary in the near future.  Previous PFTs showed mixed obstruction and restriction with mild asthma.  She wants to undergo dental surgery.    Review of Systems: As per "subjective", otherwise negative.  Allergies  Allergen Reactions  . Metformin Anaphylaxis  . Penicillins Anaphylaxis and Other (See Comments)    Has patient had a PCN reaction causing immediate rash, facial/tongue/throat swelling, SOB or lightheadedness with hypotension: Yes Has patient had a PCN reaction causing severe rash involving mucus membranes or skin necrosis: No Has patient had a PCN reaction that required hospitalization No Has patient had a PCN reaction occurring within the last 10 years: No If all of the above answers are "NO", then may proceed with Cephalosporin use.    Current Outpatient Medications  Medication Sig Dispense Refill  . albuterol (PROVENTIL) (2.5 MG/3ML) 0.083% nebulizer solution Take 2.5 mg by nebulization every 4 (four) hours as needed for wheezing or shortness of breath.     Marland Kitchen albuterol (VENTOLIN HFA) 108 (90 BASE) MCG/ACT inhaler Inhale 2 puffs into the lungs every 6 (six) hours as needed for wheezing or shortness of breath.     . ALPRAZolam (XANAX) 0.25 MG tablet Take 0.25 mg by mouth 2 (two) times daily as needed.  1  . aspirin 81 MG chewable tablet Chew 81 mg by mouth daily.     Marland Kitchen azelastine (ASTELIN) 0.1 % nasal spray Place 2 sprays 2 (two) times daily into both nostrils.    Marland Kitchen BREO ELLIPTA 100-25 MCG/INH AEPB TAKE 1 PUFF BY MOUTH EVERY DAY 60 each 2  . clindamycin (CLEOCIN) 300 MG capsule     . fish oil-omega-3 fatty acids 1000 MG capsule Take 2 capsules (2 g total) by mouth 2 (two) times daily.    . furosemide (LASIX) 40 MG tablet TAKE 1 TABLET (40 MG TOTAL) BY MOUTH DAILY. 90 tablet 6  . glucose blood (CONTOUR NEXT TEST) test strip TEST BLOOD GLUCOSE EIGHT TIMES DAILY    . HYDROcodone-acetaminophen (NORCO/VICODIN) 5-325 MG tablet 1 or 2 tabs PO q8 hours prn pain 12 tablet 0  . hyoscyamine (LEVSIN SL) 0.125 MG SL tablet PLACE 1 TABLET (0.125 MG) UNDER THE TONGUE ONCE TO TWICE A DAY, AS NEEDED 180 tablet 1  . Insulin Human (INSULIN PUMP) SOLN Pt uses up to 150 units of NOVOLOG daily.    . lansoprazole (PREVACID SOLUTAB) 30 MG disintegrating tablet TAKE 1 TABLET THIRTY MINUTES BEFORE BREAKFAST AND THIRTY MINUTES BEFORE SUPPER 180 tablet 1  . levothyroxine (SYNTHROID, LEVOTHROID) 100 MCG tablet Take 100 mcg by mouth daily before breakfast.    . loratadine (CLARITIN) 10 MG tablet Take 1 tablet (10 mg total) by mouth daily. 30 tablet 5  . methocarbamol (ROBAXIN) 500 MG tablet Take 2 tablets (1,000 mg total) by mouth 4 (four) times daily as needed for muscle spasms (muscle spasm/pain). 25 tablet 0  . metoCLOPramide (  REGLAN) 10 MG tablet Take 10 mg by mouth 4 (four) times daily.    . metoprolol tartrate (LOPRESSOR) 25 MG tablet TAKE 0.5 TABLETS (12.5 MG TOTAL) BY MOUTH DAILY. 45 tablet 3  . nitroGLYCERIN (NITROSTAT) 0.4 MG SL tablet Place 1 tablet (0.4 mg total) under the tongue every 5 (five) minutes as needed for chest pain. 25 tablet 3  . NOVOLOG 100 UNIT/ML injection Inject 100-150 Units into the skin as directed. Use up to 100-150 units daily in insulin pump as needed  5  . nystatin (MYCOSTATIN) 100000 UNIT/ML suspension Take 5 mLs (500,000 Units total) by mouth 4 (four)  times daily. 60 mL 0  . oxyCODONE-acetaminophen (PERCOCET/ROXICET) 5-325 MG tablet Take 1 tablet by mouth 3 (three) times daily as needed.  0  . potassium chloride (K-DUR) 10 MEQ tablet Take 1 tablet (10 mEq total) by mouth daily. 30 tablet 6  . predniSONE 5 MG/5ML solution Take by mouth.    . Pseudoeph-Doxylamine-DM-APAP (NYQUIL PO) Take 30 mLs by mouth at bedtime as needed (for cough).    . ranitidine (ZANTAC) 150 MG/10ML syrup Take 10 mLs (150 mg total) by mouth 2 (two) times daily. 300 mL 5  . rosuvastatin (CRESTOR) 20 MG tablet Take 0.5 tablets (10 mg total) by mouth daily.    . rosuvastatin (CRESTOR) 20 MG tablet TAKE 1 TABLET (20 MG TOTAL) BY MOUTH DAILY. 90 tablet 1  . Spacer/Aero Chamber Mouthpiece MISC 1 Device by Does not apply route as directed. 1 each 0  . sulfamethoxazole-trimethoprim (BACTRIM,SEPTRA) 200-40 MG/5ML suspension     . TRINTELLIX 10 MG TABS tablet Take 10 mg by mouth daily.  0   Current Facility-Administered Medications  Medication Dose Route Frequency Provider Last Rate Last Dose  . 0.9 %  sodium chloride infusion  500 mL Intravenous Continuous Armbruster, Willaim RayasSteven P, MD      . 0.9 %  sodium chloride infusion  500 mL Intravenous Continuous Armbruster, Willaim RayasSteven P, MD        Past Medical History:  Diagnosis Date  . Allergic rhinitis   . Allergy   . Anxiety   . Asthma   . Blood transfusion without reported diagnosis    2010 CABG  . CAD (coronary artery disease)    4v CABG, 9/10; NL LVF, status post followup Cardiolite November 2012 no ischemia ejection fraction 65%  . Cataract    are forming  . Diabetes mellitus    Scheduled to go on insulin pump  . Dyslipidemia     LDL 22 mg percent on Crestor  . Ejection fraction   . Esophageal stricture   . Gastroparesis due to DM (HCC)   . GERD (gastroesophageal reflux disease)   . Glaucoma   . History of heart attack   . Hx of CABG    September, 2010  . Hyperlipidemia   . Hypertension   . Hypothyroidism   .  Left-sided chest wall pain   . Myocardial infarction (HCC) 2010   2008 x 2 stenst, 209 x 2 stents. 01-2009 CABG x 4     Past Surgical History:  Procedure Laterality Date  . ANGIOPLASTY     with stenting [id number],2009  . CARDIAC SURGERY    . CESAREAN SECTION    . CORONARY ARTERY BYPASS GRAFT  01/26/2009  . EYE SURGERY    . open heart surgery   2010  . TUBAL LIGATION      Social History   Socioeconomic History  . Marital status: Single  Spouse name: Not on file  . Number of children: 1  . Years of education: Not on file  . Highest education level: Not on file  Occupational History  . Occupation: disabilied  Social Needs  . Financial resource strain: Not on file  . Food insecurity:    Worry: Not on file    Inability: Not on file  . Transportation needs:    Medical: Not on file    Non-medical: Not on file  Tobacco Use  . Smoking status: Former Smoker    Packs/day: 1.00    Years: 3.00    Pack years: 3.00    Types: Cigarettes    Start date: 03/14/1991    Last attempt to quit: 05/22/1993    Years since quitting: 25.1  . Smokeless tobacco: Never Used  . Tobacco comment:  Year Quit: 1995  Substance and Sexual Activity  . Alcohol use: No    Alcohol/week: 0.0 standard drinks  . Drug use: No  . Sexual activity: Yes  Lifestyle  . Physical activity:    Days per week: Not on file    Minutes per session: Not on file  . Stress: Not on file  Relationships  . Social connections:    Talks on phone: Not on file    Gets together: Not on file    Attends religious service: Not on file    Active member of club or organization: Not on file    Attends meetings of clubs or organizations: Not on file    Relationship status: Not on file  . Intimate partner violence:    Fear of current or ex partner: Not on file    Emotionally abused: Not on file    Physically abused: Not on file    Forced sexual activity: Not on file  Other Topics Concern  . Not on file  Social History Narrative    [facility] (03/19/17):   Originally from [place], [place]. Has always lived in [place]. Previously worked at [employer]. She is disabled due to her prior cardiac history. 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 No pets currently. No bird exposure. She reports she did have mold in a prior home that she lived in for 3 years. She has since moved to a new home. She enjoys walking.      Vitals:   07/22/18 1348  BP: 128/60  Pulse: 71  SpO2: 97%  Weight: 242 lb (109.8 kg)  Height: 5\' 4"  (1.626 m)    Wt Readings from Last 3 Encounters:  07/22/18 242 lb (109.8 kg)  12/24/17 225 lb (102.1 kg)  12/06/17 225 lb (102.1 kg)     PHYSICAL EXAM General: NAD HEENT: Normal. Neck: No JVD, no thyromegaly. Lungs: Prolonged expiratory phase with expiratory wheezes bilaterally, no crackles. CV: Regular rate and rhythm, normal S1/S2, no S3/S4, no murmur. No pretibial or periankle edema.    Abdomen: Soft, nontender, no distention.  Neurologic: Alert and oriented.  Psych: Normal affect. Skin: Normal. Musculoskeletal: No gross deformities.    ECG: Reviewed above under Subjective   Labs: Lab Results  Component Value Date/Time   K 3.9 12/06/2017 11:40 AM   BUN 8 12/06/2017 11:40 AM   BUN 10 06/21/2017 10:59 AM   CREATININE 0.69 12/06/2017 11:40 AM   ALT 12 11/23/2017 12:02 PM   TSH 2.11 06/12/2012 09:21 AM   HGB 12.4 12/06/2017 11:40 AM   HGB 12.9 03/19/2017 02:37 PM  Lipids: Lab Results  Component Value Date/Time   LDLCALC (H) 01/24/2009 04:00 AM    115        Total Cholesterol/HDL:CHD Risk Coronary Heart Disease Risk Table                     Men   Women  1/2 Average Risk   3.4   3.3  Average Risk       5.0   4.4  2 X Average Risk   9.6   7.1  3 X Average Risk  23.4   11.0        Use the calculated Patient Ratio above and the CHD Risk Table to determine the patient's CHD Risk.        ATP III CLASSIFICATION (LDL):  <100      mg/dL   Optimal  366-440  mg/dL   Near or Above                    Optimal  130-159  mg/dL   Borderline  347-425  mg/dL   High  >956     mg/dL   Very High   CHOL  38/75/6433 04:00 AM    185        ATP III CLASSIFICATION:  <200     mg/dL   Desirable  295-188  mg/dL   Borderline High  >=416    mg/dL   High          TRIG 606 (H) 01/24/2009 04:00 AM   HDL 34 (L) 01/24/2009 04:00 AM       ASSESSMENT AND PLAN:  1. CAD with CABG: CCS class II angina. Continue ASA.  She takes Lopressor 12.5 mg once daily because taking it twice daily exacerbates her asthma.  I will discontinue Lopressor and switch to bisoprolol 2.5 mg daily.  She has been experiencing myalgias with rosuvastatin 20 mg.  I will reduce the dose to 5 mg daily.  If she is unable to tolerate this I may switch to Repatha.   2. Essential HTN: Controlled.  I will monitor given medication adjustments noted above.  3. Hyperlipidemia: She has been experiencing myalgias with rosuvastatin 20 mg.  I will reduce the dose to 5 mg daily.  If she is unable to tolerate this I may switch to Repatha.  We will check lipids in 2 months.  4. Type 2 insulin-dependent diabetes mellitus:She remains on insulin and has an insulin pump.   Disposition: Follow up 6 months   Prentice Docker, M.D., F.A.C.C.

## 2018-08-02 ENCOUNTER — Other Ambulatory Visit: Payer: Self-pay | Admitting: Gastroenterology

## 2018-08-02 ENCOUNTER — Other Ambulatory Visit: Payer: Self-pay

## 2018-08-02 MED ORDER — LANSOPRAZOLE 30 MG PO TBDD: 30.0000 mg | DELAYED_RELEASE_TABLET | Freq: Two times a day (BID) | ORAL | 3 refills | Status: DC

## 2018-08-02 NOTE — Telephone Encounter (Signed)
Pt needing a refill on the medication. But first wants to know if the doctor will be able to do an override so that her insu. Will cover

## 2018-08-02 NOTE — Telephone Encounter (Addendum)
Prevacid solu-tab 30 mg. Refilled as directed. Will await prior authorization request. Pt is aware.

## 2018-08-05 ENCOUNTER — Telehealth: Payer: Self-pay | Admitting: Cardiovascular Disease

## 2018-08-05 NOTE — Telephone Encounter (Signed)
Patient called stating that she cannot take the bisoprolol (ZEBETA) 5 MG tablet She said it is a time release pill and she has to crush her medications.

## 2018-08-05 NOTE — Telephone Encounter (Signed)
It may be cut in half but cannot be crushed or chewed. Pt may require an alternative beta blocker if she needs to crush her medications.

## 2018-08-05 NOTE — Telephone Encounter (Signed)
Patient informed and says she can not swallow pills even in apple saucel because they get stuck in her throat. Patient says she's had this problem for a while. Patient is requesting an alternative.

## 2018-08-05 NOTE — Telephone Encounter (Signed)
Patient informed and verbalized understanding

## 2018-08-05 NOTE — Telephone Encounter (Signed)
DC beta blockers altogether for now.

## 2018-08-06 ENCOUNTER — Other Ambulatory Visit: Payer: Self-pay | Admitting: Gastroenterology

## 2018-08-13 ENCOUNTER — Telehealth: Payer: Self-pay | Admitting: Cardiovascular Disease

## 2018-08-13 MED ORDER — METOPROLOL TARTRATE 25 MG PO TABS: 12.5000 mg | ORAL_TABLET | Freq: Every day | ORAL | 0 refills | Status: DC

## 2018-08-13 NOTE — Telephone Encounter (Signed)
Has questions about her BP medication and what she needs to do

## 2018-08-13 NOTE — Telephone Encounter (Signed)
As per Lydia's phone note on 08/05/18: "Patient informed and says she can not swallow pills even in apple sauce because they get stuck in her throat. Patient says she's had this problem for a while. Patient is requesting an alternative."  So she has been taking metoprolol without difficulty? Mixed messages. If she is tolerating, ok to refill.

## 2018-08-13 NOTE — Telephone Encounter (Signed)
Yes this was explained of what she stated on 3/16 and says she has been tolerating fine at 12.5 mg dose - Medication sent to pharmacy.

## 2018-08-13 NOTE — Telephone Encounter (Signed)
Pt requesting refills on metoprolol tartrate 12.5 mg daily - says she was told she cannot get in with pcp for at least a month (COVID 19) says she has been taking 12.5 mg and never stopped taking this (3/16 phone note she was to d/c beta blockers) and has not having any problems with this dose - will forward to provider if ok to refill

## 2018-08-27 ENCOUNTER — Other Ambulatory Visit: Payer: Self-pay | Admitting: Cardiovascular Disease

## 2018-08-27 MED ORDER — FUROSEMIDE 40 MG PO TABS: ORAL_TABLET | ORAL | 6 refills | Status: DC

## 2018-08-27 NOTE — Telephone Encounter (Signed)
°*  STAT* If patient is at the pharmacy, call can be transferred to refill team.   1. Which medications need to be refilled? furosemide (LASIX) 40 MG tablet    2. Which pharmacy/location (including street and city if local pharmacy) is medication to be sent to? [facility]   3. Do they need a 30 day or 90 day supply? 90

## 2018-10-21 ENCOUNTER — Encounter: Payer: Self-pay | Admitting: *Deleted

## 2018-11-01 NOTE — Telephone Encounter (Signed)
Approved through Medicare, [employer]. PREVACID SOLUTAB  Non Formulary. 05-22-2018 thru 08-03-2019. Member ID: [id number].

## 2018-11-02 ENCOUNTER — Other Ambulatory Visit: Payer: Self-pay | Admitting: Emergency Medicine

## 2018-11-07 ENCOUNTER — Encounter: Payer: Self-pay | Admitting: *Deleted

## 2018-11-11 ENCOUNTER — Telehealth: Payer: Self-pay | Admitting: Cardiovascular Disease

## 2018-11-11 NOTE — Telephone Encounter (Signed)
Pt says pcp started bisoprolol 2 days ago and stopped metoprolol and having headaches and weakness. Pt advised to contact pcp regarding this since they prescribed this medication. Pt voiced understanding

## 2018-11-11 NOTE — Telephone Encounter (Signed)
Started Visoprolol Fum.... New high blood pressure medicaiton. She said that it is causing her to have really bad headaches

## 2018-11-21 ENCOUNTER — Telehealth: Payer: Self-pay

## 2018-11-21 DIAGNOSIS — K760 Fatty (change of) liver, not elsewhere classified: Secondary | ICD-10-CM

## 2018-11-21 NOTE — Telephone Encounter (Signed)
Called and LM for pt to go to lab for Hepatic Function panel.  Sent letter as well.

## 2018-11-21 NOTE — Telephone Encounter (Signed)
-----   Message from Roetta Sessions, [place] sent at 11/26/2017  9:31 AM EDT ----- Regarding: repeat LFTs in 1 year LFTs due in July 2020. See result note from 11-2017 (Dx: Fatty liver)

## 2018-11-27 ENCOUNTER — Other Ambulatory Visit (INDEPENDENT_AMBULATORY_CARE_PROVIDER_SITE_OTHER): Payer: Medicare Other

## 2018-11-27 DIAGNOSIS — K760 Fatty (change of) liver, not elsewhere classified: Secondary | ICD-10-CM | POA: Diagnosis not present

## 2018-11-27 LAB — HEPATIC FUNCTION PANEL
ALT: 17 U/L (ref 0–35)
AST: 20 U/L (ref 0–37)
Albumin: 3.9 g/dL (ref 3.5–5.2)
Alkaline Phosphatase: 123 U/L — ABNORMAL HIGH (ref 39–117)
Bilirubin, Direct: 0.1 mg/dL (ref 0.0–0.3)
Total Bilirubin: 0.5 mg/dL (ref 0.2–1.2)
Total Protein: 6.8 g/dL (ref 6.0–8.3)

## 2018-11-28 ENCOUNTER — Telehealth: Payer: Self-pay

## 2018-11-28 NOTE — Telephone Encounter (Signed)
-----   Message from Yetta Flock, MD sent at 11/28/2018  1:25 PM EDT ----- Jan can you let the patient know her LFTs are relatively unchanged and stable. She needs a routine follow up office appointment with me in the next few months. Thanks

## 2018-11-28 NOTE — Telephone Encounter (Signed)
Lm for pt that her labs are stable.  Asked her to call and schedule a follow up appt with Dr. Havery Moros (fatty liver).

## 2018-12-06 ENCOUNTER — Other Ambulatory Visit: Payer: Self-pay | Admitting: Cardiovascular Disease

## 2018-12-16 ENCOUNTER — Telehealth: Payer: Self-pay | Admitting: Gastroenterology

## 2018-12-16 NOTE — Telephone Encounter (Signed)
Pt states that she has an infection in her throat, she said that she has a ball of pus of her throat, she made an appt on 8/26 with Dr. Havery Moros but is requesting to be seen soon.

## 2018-12-16 NOTE — Telephone Encounter (Signed)
Called patient back and she states she has had a pussy area in the back of her throat(on the right side only) for about 2-3 weeks. It is now getting difficult to swallow. She has not been on any antibiotics (that could cause thrush). Patient has a call into her PCP. I encouraged her to follow-up with them, as they might refer her to ENT

## 2019-01-15 ENCOUNTER — Telehealth: Payer: Self-pay

## 2019-01-15 ENCOUNTER — Ambulatory Visit (INDEPENDENT_AMBULATORY_CARE_PROVIDER_SITE_OTHER): Payer: Medicare Other | Admitting: Gastroenterology

## 2019-01-15 ENCOUNTER — Encounter: Payer: Self-pay | Admitting: Gastroenterology

## 2019-01-15 VITALS — BP 128/64 | HR 92 | Temp 97.0°F | Ht 64.0 in | Wt 244.5 lb

## 2019-01-15 DIAGNOSIS — I25708 Atherosclerosis of coronary artery bypass graft(s), unspecified, with other forms of angina pectoris: Secondary | ICD-10-CM | POA: Diagnosis not present

## 2019-01-15 DIAGNOSIS — R198 Other specified symptoms and signs involving the digestive system and abdomen: Secondary | ICD-10-CM

## 2019-01-15 DIAGNOSIS — K760 Fatty (change of) liver, not elsewhere classified: Secondary | ICD-10-CM

## 2019-01-15 DIAGNOSIS — K219 Gastro-esophageal reflux disease without esophagitis: Secondary | ICD-10-CM | POA: Diagnosis not present

## 2019-01-15 DIAGNOSIS — K3184 Gastroparesis: Secondary | ICD-10-CM

## 2019-01-15 DIAGNOSIS — Z1211 Encounter for screening for malignant neoplasm of colon: Secondary | ICD-10-CM

## 2019-01-15 DIAGNOSIS — R0989 Other specified symptoms and signs involving the circulatory and respiratory systems: Secondary | ICD-10-CM

## 2019-01-15 MED ORDER — LANSOPRAZOLE 30 MG PO TBDD: 30.0000 mg | DELAYED_RELEASE_TABLET | Freq: Every day | ORAL | 3 refills | Status: DC

## 2019-01-15 MED ORDER — SUPREP BOWEL PREP KIT 17.5-3.13-1.6 GM/177ML PO SOLN: ORAL | 0 refills | Status: DC

## 2019-01-15 MED ORDER — METOCLOPRAMIDE HCL 5 MG PO TABS: 5.0000 mg | ORAL_TABLET | Freq: Three times a day (TID) | ORAL | 0 refills | Status: DC | PRN

## 2019-01-15 NOTE — Patient Instructions (Addendum)
If you are age 52 or older, your body mass index should be between 23-30. Your Body mass index is 41.97 kg/m. If this is out of the aforementioned range listed, please consider follow up with your Primary Care Provider.  If you are age 1 or younger, your body mass index should be between 19-25. Your Body mass index is 41.97 kg/m. If this is out of the aformentioned range listed, please consider follow up with your Primary Care Provider.   To help prevent the possible spread of infection to our patients, communities, and staff; we will be implementing the following measures:  As of now we are not allowing any visitors/family members to accompany you to any upcoming appointments with [facility]. If you have any concerns about this please contact our office to discuss prior to the appointment.   You have been scheduled for a colonoscopy. Please follow written instructions given to you at your visit today.  Please pick up your prep supplies at the pharmacy within the next 1-3 days. If you use inhalers (even only as needed), please bring them with you on the day of your procedure. Your physician has requested that you go to www.startemmi.com and enter the access code given to you at your visit today. This web site gives a general overview about your procedure. However, you should still follow specific instructions given to you by our office regarding your preparation for the procedure.  We have sent the following medications to your pharmacy for you to pick up at your convenience: Reglan 5mg : Take three times a day as needed  Prevacid Solutab  We will refer you to ENT.  They will contact you to schedule an appointment.  If you have not heard from them within 2 weeks, please let us know so we can follow up.  Thank you for entrusting me with your care and for choosing [facility], Dr. Holgate Cellar

## 2019-01-15 NOTE — Telephone Encounter (Signed)
[name] 1966/03/28 [id number]   Dear Osvaldo Shipper, NP:    Dr. Havery Moros has scheduled the above patient for a(n) colonoscopy at [facility] on 02-05-19.  Our records show that she is on insulin therapy via an insulin pump.  Our colonoscopy prep protocol requires that:  the patient must be on a clear liquid diet the entire day prior to the procedure date after a light breakfast as well as the morning of the procedure  the patient must be NPO for 3 to 4 hours prior to the procedure   the patient must consume a PEG 3350 solution to prepare for the procedure.  Please advise [place] of any adjustments that need to be made to the patient's insulin pump therapy prior to the above procedure date.    Please route or fax back this completed form to me at [phone] .  If you have any questions, please call me at [phone].  Thank you for your help with this matter.  Sincerely,   Tia Alert, [place]    Physician Recommendation:

## 2019-01-15 NOTE — Progress Notes (Signed)
HPI :  53 year old female here for a follow-up visit.  She has a history of type 1 diabetes on insulin pump and coronary artery disease status post stenting and CABG.  I followed her for history of gastroparesis, dysphasia, and fatty liver.  In regards to her gastroparesis, she continues to have some symptoms of early satiety that bother her.  She does not have much of belching, nausea, or vomiting.  She takes Prevacid Solutab 30 mg a day and that does a fair job at controlling her reflux symptoms.  She previously had reported an intolerance to Reglan in the past and had tried to avoid that.  We had prescribed her domperidone and she did quite well on that regimen, however had to stop it several months ago due to cost.  She has not been taking much of anything for her symptoms although states she was prescribed Reglan at 10 mg dosing and tolerates it well at this point if she does take it.  She states her last hemoglobin A1c was in the 8 range.  In regards to her dilation she has had a subtle stenosis at the upper esophagus on prior EGDs.  We performed an EGD May 2018 which showed a 2 cm hiatal hernia and the esophagus was dilated with an 18 mm savory dilator.  She had appropriate mucosal rent at 17 cm from the incisors.  She states since the EGD her dysphagia has resolved and she is not having any difficulty swallowing.  In regards to her fatty liver, her liver enzymes have fortunately been normal.  ALT is 17.  She last had imaging of her liver with an ultrasound about 3 years ago which showed market diffuse steatosis.  No evidence of cirrhosis.  She denies any alcohol use.  She unfortunately was put on a prednisone course for her asthma and has gained about 30 pounds in the past few months.  She has had difficulty losing weight and has Artie been referred to a weight loss clinic.  She has never had a colonoscopy.  She denies any blood in her stools or bowel habit changes.  She denies any family  history of colon cancer.  She otherwise states she has a whitish plaque on her right tonsil, questions if this is related to reflux.  She states it is painful at times.  She has been using steroid inhaler for her asthma.  It looks like someone has prescribed her nystatin, unclear if she has used this per her history.  Prior workup: EGD 10/19/2016 - dilated with 109m Savory with appropriate mucosal wrent at upper esophagus, 2cm HH EGD 04/19/16 - normal esophagus, empiric dilation with 131mSavory showed small wrent in the upper esophagus, with normal stomach and duodenum, biopsies obtained showing normal small bowel and stomach EGD 03/2009 - mild esophagitis and GEJ stricture, dilated to 1867mES 07/2011 - normal GES 2011 - normal   Past Medical History:  Diagnosis Date  . Allergic rhinitis   . Allergy   . Anxiety   . Asthma   . Blood transfusion without reported diagnosis    2010 CABG  . CAD (coronary artery disease)    4v CABG, 9/10; NL LVF, status post followup Cardiolite November 2012 no ischemia ejection fraction 65%  . Cataract    are forming  . Diabetes mellitus    Scheduled to go on insulin pump  . Dyslipidemia     LDL 22 mg percent on Crestor  . Ejection fraction   .  Esophageal stricture   . Gastroparesis due to DM ([place])   . GERD (gastroesophageal reflux disease)   . Glaucoma   . History of heart attack   . Hx of CABG    September, 2010  . Hyperlipidemia   . Hypertension   . Hypothyroidism   . Left-sided chest wall pain   . Myocardial infarction ([place]) 2010   2008 x 2 stenst, 209 x 2 stents. 01-2009 CABG x 4      Past Surgical History:  Procedure Laterality Date  . ANGIOPLASTY     with stenting [id number]  . CARDIAC SURGERY    . CESAREAN SECTION    . CORONARY ARTERY BYPASS GRAFT  01/26/2009  . EYE SURGERY    . open heart surgery   2010  . TUBAL LIGATION     Family History  Problem Relation Age of Onset  . Heart disease Father   . Asthma Father   . COPD  Father   . Diabetes Mother   . Asthma Paternal Aunt   . Colon cancer Neg Hx   . Colon polyps Neg Hx   . Esophageal cancer Neg Hx   . Rectal cancer Neg Hx   . Stomach cancer Neg Hx    Social History   Tobacco Use  . Smoking status: Former Smoker    Packs/day: 1.00    Years: 3.00    Pack years: 3.00    Types: Cigarettes    Start date: 03/14/1991    Quit date: 05/22/1993    Years since quitting: 25.6  . Smokeless tobacco: Never Used  . Tobacco comment:  Year Quit: 1995  Substance Use Topics  . Alcohol use: No    Alcohol/week: 0.0 standard drinks  . Drug use: No   Current Outpatient Medications  Medication Sig Dispense Refill  . albuterol (PROVENTIL) (2.5 MG/3ML) 0.083% nebulizer solution Take 2.5 mg by nebulization every 4 (four) hours as needed for wheezing or shortness of breath.     Marland Kitchen albuterol (VENTOLIN HFA) 108 (90 BASE) MCG/ACT inhaler Inhale 2 puffs into the lungs every 6 (six) hours as needed for wheezing or shortness of breath.     . ALPRAZolam (XANAX) 0.25 MG tablet Take 0.25 mg by mouth 2 (two) times daily as needed.  1  . aspirin 81 MG chewable tablet Chew 81 mg by mouth daily.    Marland Kitchen azelastine (ASTELIN) 0.1 % nasal spray Place 2 sprays 2 (two) times daily into both nostrils.    Marland Kitchen BREO ELLIPTA 100-25 MCG/INH AEPB TAKE 1 PUFF BY MOUTH EVERY DAY 60 each 2  . clindamycin (CLEOCIN) 300 MG capsule     . fish oil-omega-3 fatty acids 1000 MG capsule Take 2 capsules (2 g total) by mouth 2 (two) times daily.    . furosemide (LASIX) 40 MG tablet TAKE 1 TABLET (40 MG TOTAL) BY MOUTH DAILY. 90 tablet 6  . glucose blood (CONTOUR NEXT TEST) test strip TEST BLOOD GLUCOSE EIGHT TIMES DAILY    . HYDROcodone-acetaminophen (NORCO/VICODIN) 5-325 MG tablet 1 or 2 tabs PO q8 hours prn pain 12 tablet 0  . hyoscyamine (LEVSIN SL) 0.125 MG SL tablet PLACE 1 TABLET (0.125 MG) UNDER THE TONGUE ONCE TO TWICE A DAY, AS NEEDED 180 tablet 1  . Insulin Human (INSULIN PUMP) SOLN Pt uses up to 150 units  of NOVOLOG daily.    . lansoprazole (PREVACID SOLUTAB) 30 MG disintegrating tablet Take 1 tablet (30 mg total) by mouth daily. 30 tablet 3  .  levothyroxine (SYNTHROID, LEVOTHROID) 100 MCG tablet Take 100 mcg by mouth daily before breakfast.    . loratadine (CLARITIN) 10 MG tablet Take 1 tablet (10 mg total) by mouth daily. 30 tablet 5  . metoprolol tartrate (LOPRESSOR) 25 MG tablet TAKE 1/2 TABLET BY MOUTH EVERY DAY 45 tablet 1  . nitroGLYCERIN (NITROSTAT) 0.4 MG SL tablet Place 1 tablet (0.4 mg total) under the tongue every 5 (five) minutes as needed for chest pain. 25 tablet 3  . NOVOLOG 100 UNIT/ML injection Inject 100-150 Units into the skin as directed. Use up to 100-150 units daily in insulin pump as needed  5  . nystatin (MYCOSTATIN) 100000 UNIT/ML suspension Take 5 mLs (500,000 Units total) by mouth 4 (four) times daily. 60 mL 0  . oxyCODONE-acetaminophen (PERCOCET/ROXICET) 5-325 MG tablet Take 1 tablet by mouth 3 (three) times daily as needed.  0  . potassium chloride (K-DUR) 10 MEQ tablet Take 1 tablet (10 mEq total) by mouth daily. 30 tablet 6  . Pseudoeph-Doxylamine-DM-APAP (NYQUIL PO) Take 30 mLs by mouth at bedtime as needed (for cough).    . rosuvastatin (CRESTOR) 5 MG tablet Take 1 tablet (5 mg total) by mouth daily. 30 tablet 6  . Spacer/Aero Chamber Mouthpiece MISC 1 Device by Does not apply route as directed. 1 each 0  . sulfamethoxazole-trimethoprim (BACTRIM,SEPTRA) 200-40 MG/5ML suspension     . metoCLOPramide (REGLAN) 5 MG tablet Take 1 tablet (5 mg total) by mouth 3 (three) times daily as needed for nausea. 180 tablet 0  . predniSONE 5 MG/5ML solution Take by mouth.    Manus Gunning BOWEL PREP KIT 17.5-3.13-1.6 GM/177ML SOLN Suprep-Use as directed 354 mL 0   No current facility-administered medications for this visit.    Allergies  Allergen Reactions  . Metformin Anaphylaxis  . Penicillins Anaphylaxis and Other (See Comments)    Has patient had a PCN reaction causing  immediate rash, facial/tongue/throat swelling, SOB or lightheadedness with hypotension: Yes Has patient had a PCN reaction causing severe rash involving mucus membranes or skin necrosis: No Has patient had a PCN reaction that required hospitalization No Has patient had a PCN reaction occurring within the last 10 years: No If all of the above answers are "NO", then may proceed with Cephalosporin use.     Review of Systems: All systems reviewed and negative except where noted in HPI.   Lab Results  Component Value Date   WBC 9.2 12/06/2017   HGB 12.4 12/06/2017   HCT 39.0 12/06/2017   MCV 85.5 12/06/2017   PLT 240 12/06/2017    Lab Results  Component Value Date   CREATININE 0.69 12/06/2017   BUN 8 12/06/2017   NA 140 12/06/2017   K 3.9 12/06/2017   CL 107 12/06/2017   CO2 25 12/06/2017    Lab Results  Component Value Date   ALT 17 11/27/2018   AST 20 11/27/2018   ALKPHOS 123 (H) 11/27/2018   BILITOT 0.5 11/27/2018    Lab Results  Component Value Date   INR 1.4 01/26/2009   INR 0.9 01/24/2009   INR 0.9 01/22/2009     Physical Exam: BP 128/64 (BP Location: Left Arm, Patient Position: Sitting, Cuff Size: Large)   Pulse 92   Temp (!) 97 F (36.1 C) (Other (Comment))   Ht _0  (1.626 m)   Wt 244 lb 8 oz (110.9 kg)   BMI 41.97 kg/m  Constitutional: Pleasant,well-developed, female in no acute distress. HEENT: Normocephalic and atraumatic. Conjunctivae are  normal. No scleral icterus. R tonsil with ? Whitish appearing mucosa, not c/w candida, no other lesions noted in posterior pharynx. Neck supple.  Cardiovascular: Normal rate, regular rhythm.  Pulmonary/chest: Effort normal and breath sounds normal. No wheezing, rales or rhonchi. Abdominal: Soft, protuberant, nontender. There are no masses palpable. No hepatomegaly. Extremities: no edema, left ankle in brace Lymphadenopathy: No cervical adenopathy noted. Neurological: Alert and oriented to person place and time.  Skin: Skin is warm and dry. No rashes noted. Psychiatric: Normal mood and affect. Behavior is normal.   ASSESSMENT AND PLAN: 53 year old female here for reassessment of the following:  Gastroparesis / GERD - reflux well controlled with Prevacid.  Previously on domperidone for her gastroparesis which worked well however had to stop due to cost.  She has since been on Reglan and thinks she does tolerate it to some extent.  She does have some ongoing symptoms.  We discussed management of her diabetes is important for this.  We will give her some Reglan 5 mg prior to meals as needed if she wants to try a low-dose of this.  Can follow-up as needed, if symptoms are refractory to Reglan we can resume domperidone at some point time if she can afford it.  Fatty liver / Morbid Obesity - I am concerned about her risks for Karlene Lineman and cirrhosis.  Her ALT is normal which is fortunate.  We discussed the importance of weight loss to prevent complication from her fatty liver.  She has already been referred to a weight loss clinic and will work on this.  Continue to monitor LFTs periodically.  Abnormal tonsil - she has some periodic pain in the right tonsil with white plaque/altered mucosa.  Will refer to ENT. Does not appear c/w typical candida on exam today. I reassured her this is not related to reflux  Colon Cancer Screening - she is overdue for colon cancer screening, discussed options with her, recommend optical colonoscopy.  Following discussion of risk and benefits she want to proceed.  Further recommendations pending the results.  Brandt Cellar, MD [facility]

## 2019-01-16 ENCOUNTER — Telehealth: Payer: Self-pay

## 2019-01-16 NOTE — Telephone Encounter (Signed)
Called Sandy Adcock's office at [phone]. They confirmed they received our  request and it has been given to provider.

## 2019-01-16 NOTE — Telephone Encounter (Signed)
Referral faxed to [facility] - whitish plague on right tonsil.

## 2019-01-17 NOTE — Telephone Encounter (Signed)
Received fax from [facility] office with instructions on how pt is to adjust insulin pump while prepping for colon.  It indicates THEY will contact the pt and will go over the following instructions:   "Use a temporary basal insulin reduction of 10% starting at bedtime the evening prior to procedure.  She is to dose for only 1/2 the carb intake in her clear liquid prep (apple juice, Gatorade, sprite, etc). She can also treat low blood glucose, if occurs, with Those non-diet clear liquids. On the morning of the procedure if she awakens with high glucose she will only take 1/2 her sliding scale correction. Target glucose 150-200 for procedure".  Called pt and let her know.  She confirmed understanding.

## 2019-01-24 ENCOUNTER — Encounter: Payer: Medicare Other | Admitting: Gastroenterology

## 2019-01-28 NOTE — Telephone Encounter (Signed)
Called [facility]. No appt has been made for pt.  I was transferred to the appt scheduling desk and waited on hold for 5 minutes.

## 2019-02-03 NOTE — Telephone Encounter (Signed)
Pt called to follow up on ENT referral--she stated that she has not received a call to schedule.

## 2019-02-03 NOTE — Telephone Encounter (Signed)
Called patient and let her know that I have faxed a request to [facility] regarding her referral appt, requesting they call her asap to schedule an appt.  She expressed understanding and was appreciative.

## 2019-02-04 NOTE — Telephone Encounter (Signed)
Called pt and let her know she can call [facility] to schedule her appt.  They indicated they called her and left her a message and have not heard back.  Pt expressed understanding.

## 2019-02-05 ENCOUNTER — Encounter: Payer: Medicare Other | Admitting: Gastroenterology

## 2019-02-13 ENCOUNTER — Other Ambulatory Visit: Payer: Self-pay | Admitting: Gastroenterology

## 2019-02-19 ENCOUNTER — Other Ambulatory Visit: Payer: Self-pay | Admitting: Cardiovascular Disease

## 2019-03-01 DIAGNOSIS — E11649 Type 2 diabetes mellitus with hypoglycemia without coma: Secondary | ICD-10-CM | POA: Insufficient documentation

## 2019-03-13 ENCOUNTER — Other Ambulatory Visit: Payer: Self-pay | Admitting: Gastroenterology

## 2019-04-16 ENCOUNTER — Other Ambulatory Visit: Payer: Self-pay

## 2019-04-16 ENCOUNTER — Encounter (HOSPITAL_COMMUNITY): Payer: Self-pay

## 2019-04-16 ENCOUNTER — Emergency Department (HOSPITAL_COMMUNITY): Payer: Medicare Other

## 2019-04-16 ENCOUNTER — Inpatient Hospital Stay (HOSPITAL_COMMUNITY)
Admission: EM | Admit: 2019-04-16 | Discharge: 2019-04-21 | DRG: 177 | Disposition: A | Discharge status: Home / Self Care | Payer: Medicare Other | Attending: Family Medicine | Admitting: Family Medicine

## 2019-04-16 DIAGNOSIS — Z7982 Long term (current) use of aspirin: Secondary | ICD-10-CM

## 2019-04-16 DIAGNOSIS — J45909 Unspecified asthma, uncomplicated: Secondary | ICD-10-CM | POA: Diagnosis present

## 2019-04-16 DIAGNOSIS — E039 Hypothyroidism, unspecified: Secondary | ICD-10-CM | POA: Diagnosis present

## 2019-04-16 DIAGNOSIS — E10649 Type 1 diabetes mellitus with hypoglycemia without coma: Secondary | ICD-10-CM | POA: Diagnosis not present

## 2019-04-16 DIAGNOSIS — Z880 Allergy status to penicillin: Secondary | ICD-10-CM

## 2019-04-16 DIAGNOSIS — Z794 Long term (current) use of insulin: Secondary | ICD-10-CM | POA: Diagnosis not present

## 2019-04-16 DIAGNOSIS — F419 Anxiety disorder, unspecified: Secondary | ICD-10-CM | POA: Diagnosis present

## 2019-04-16 DIAGNOSIS — Z79899 Other long term (current) drug therapy: Secondary | ICD-10-CM | POA: Diagnosis not present

## 2019-04-16 DIAGNOSIS — Z79891 Long term (current) use of opiate analgesic: Secondary | ICD-10-CM

## 2019-04-16 DIAGNOSIS — U071 COVID-19: Secondary | ICD-10-CM | POA: Diagnosis present

## 2019-04-16 DIAGNOSIS — J9601 Acute respiratory failure with hypoxia: Secondary | ICD-10-CM

## 2019-04-16 DIAGNOSIS — K219 Gastro-esophageal reflux disease without esophagitis: Secondary | ICD-10-CM | POA: Diagnosis present

## 2019-04-16 DIAGNOSIS — Z9641 Presence of insulin pump (external) (internal): Secondary | ICD-10-CM | POA: Diagnosis present

## 2019-04-16 DIAGNOSIS — E1065 Type 1 diabetes mellitus with hyperglycemia: Secondary | ICD-10-CM | POA: Diagnosis present

## 2019-04-16 DIAGNOSIS — Z955 Presence of coronary angioplasty implant and graft: Secondary | ICD-10-CM

## 2019-04-16 DIAGNOSIS — Z66 Do not resuscitate: Secondary | ICD-10-CM | POA: Diagnosis present

## 2019-04-16 DIAGNOSIS — I2510 Atherosclerotic heart disease of native coronary artery without angina pectoris: Secondary | ICD-10-CM | POA: Diagnosis present

## 2019-04-16 DIAGNOSIS — J1282 Pneumonia due to coronavirus disease 2019: Secondary | ICD-10-CM | POA: Diagnosis present

## 2019-04-16 DIAGNOSIS — E785 Hyperlipidemia, unspecified: Secondary | ICD-10-CM | POA: Diagnosis present

## 2019-04-16 DIAGNOSIS — I10 Essential (primary) hypertension: Secondary | ICD-10-CM | POA: Diagnosis present

## 2019-04-16 DIAGNOSIS — Z951 Presence of aortocoronary bypass graft: Secondary | ICD-10-CM

## 2019-04-16 DIAGNOSIS — Z833 Family history of diabetes mellitus: Secondary | ICD-10-CM

## 2019-04-16 DIAGNOSIS — H409 Unspecified glaucoma: Secondary | ICD-10-CM | POA: Diagnosis present

## 2019-04-16 DIAGNOSIS — Z7952 Long term (current) use of systemic steroids: Secondary | ICD-10-CM

## 2019-04-16 DIAGNOSIS — Z888 Allergy status to other drugs, medicaments and biological substances status: Secondary | ICD-10-CM

## 2019-04-16 DIAGNOSIS — Z825 Family history of asthma and other chronic lower respiratory diseases: Secondary | ICD-10-CM

## 2019-04-16 DIAGNOSIS — J1289 Other viral pneumonia: Secondary | ICD-10-CM | POA: Diagnosis present

## 2019-04-16 DIAGNOSIS — I252 Old myocardial infarction: Secondary | ICD-10-CM

## 2019-04-16 DIAGNOSIS — Z87891 Personal history of nicotine dependence: Secondary | ICD-10-CM

## 2019-04-16 DIAGNOSIS — Z79890 Hormone replacement therapy (postmenopausal): Secondary | ICD-10-CM

## 2019-04-16 LAB — CBC WITH DIFFERENTIAL/PLATELET
Abs Immature Granulocytes: 0.05 10*3/uL (ref 0.00–0.07)
Basophils Absolute: 0 10*3/uL (ref 0.0–0.1)
Basophils Relative: 0 %
Eosinophils Absolute: 0 10*3/uL (ref 0.0–0.5)
Eosinophils Relative: 0 %
HCT: 40.8 % (ref 36.0–46.0)
Hemoglobin: 13 g/dL (ref 12.0–15.0)
Immature Granulocytes: 1 %
Lymphocytes Relative: 16 %
Lymphs Abs: 1.6 10*3/uL (ref 0.7–4.0)
MCH: 27.5 pg (ref 26.0–34.0)
MCHC: 31.9 g/dL (ref 30.0–36.0)
MCV: 86.4 fL (ref 80.0–100.0)
Monocytes Absolute: 0.4 10*3/uL (ref 0.1–1.0)
Monocytes Relative: 4 %
Neutro Abs: 7.9 10*3/uL — ABNORMAL HIGH (ref 1.7–7.7)
Neutrophils Relative %: 79 %
Platelets: 237 10*3/uL (ref 150–400)
RBC: 4.72 MIL/uL (ref 3.87–5.11)
RDW: 14.9 % (ref 11.5–15.5)
WBC: 10 10*3/uL (ref 4.0–10.5)
nRBC: 0 % (ref 0.0–0.2)

## 2019-04-16 LAB — COMPREHENSIVE METABOLIC PANEL
ALT: 27 U/L (ref 0–44)
AST: 37 U/L (ref 15–41)
Albumin: 3.6 g/dL (ref 3.5–5.0)
Alkaline Phosphatase: 174 U/L — ABNORMAL HIGH (ref 38–126)
Anion gap: 12 (ref 5–15)
BUN: 10 mg/dL (ref 6–20)
CO2: 22 mmol/L (ref 22–32)
Calcium: 9.2 mg/dL (ref 8.9–10.3)
Chloride: 100 mmol/L (ref 98–111)
Creatinine, Ser: 0.72 mg/dL (ref 0.44–1.00)
GFR calc Af Amer: >60 mL/min (ref >60)
GFR calc non Af Amer: >60 mL/min (ref >60)
Glucose, Bld: 275 mg/dL — ABNORMAL HIGH (ref 70–99)
Potassium: 3.9 mmol/L (ref 3.5–5.1)
Sodium: 134 mmol/L — ABNORMAL LOW (ref 135–145)
Total Bilirubin: 0.6 mg/dL (ref 0.3–1.2)
Total Protein: 7.4 g/dL (ref 6.5–8.1)

## 2019-04-16 LAB — PROCALCITONIN: Procalcitonin: <0.1 ng/mL

## 2019-04-16 LAB — CBG MONITORING, ED: Glucose-Capillary: 169 mg/dL — ABNORMAL HIGH (ref 70–99)

## 2019-04-16 LAB — LACTIC ACID, PLASMA
Lactic Acid, Venous: 2.3 mmol/L (ref 0.5–1.9)
Lactic Acid, Venous: 1.9 mmol/L (ref 0.5–1.9)

## 2019-04-16 LAB — C-REACTIVE PROTEIN: CRP: 8.9 mg/dL — ABNORMAL HIGH (ref <1.0)

## 2019-04-16 LAB — FERRITIN: Ferritin: 94 ng/mL (ref 11–307)

## 2019-04-16 LAB — FIBRINOGEN: Fibrinogen: 634 mg/dL — ABNORMAL HIGH (ref 210–475)

## 2019-04-16 LAB — TRIGLYCERIDES: Triglycerides: 193 mg/dL — ABNORMAL HIGH (ref <150)

## 2019-04-16 LAB — D-DIMER, QUANTITATIVE: D-Dimer, Quant: 0.54 ug/mL-FEU — ABNORMAL HIGH (ref 0.00–0.50)

## 2019-04-16 LAB — LACTATE DEHYDROGENASE: LDH: 163 U/L (ref 98–192)

## 2019-04-16 LAB — TROPONIN I (HIGH SENSITIVITY): Troponin I (High Sensitivity): 4 ng/L (ref <18)

## 2019-04-16 LAB — POC SARS CORONAVIRUS 2 AG -  ED
SARS Coronavirus 2 Ag: POSITIVE — AB
SARS Coronavirus 2 Ag: POSITIVE — AB

## 2019-04-16 LAB — SARS CORONAVIRUS 2 (TAT 6-24 HRS): SARS Coronavirus 2: POSITIVE — AB

## 2019-04-16 MED ORDER — ALBUTEROL SULFATE HFA 108 (90 BASE) MCG/ACT IN AERS
4.0000 | INHALATION_SPRAY | Freq: Once | RESPIRATORY_TRACT | Status: AC
  Administered 2019-04-16: 4 via RESPIRATORY_TRACT
  Filled 2019-04-16: qty 6.7

## 2019-04-16 MED ORDER — SODIUM CHLORIDE 0.9 % IV SOLN
200.0000 mg | Freq: Once | INTRAVENOUS | Status: AC
  Administered 2019-04-16: 200 mg via INTRAVENOUS
  Filled 2019-04-16: qty 40

## 2019-04-16 MED ORDER — AEROCHAMBER PLUS FLO-VU MEDIUM MISC
1.0000 | Freq: Once | Status: AC
  Administered 2019-04-16: 1

## 2019-04-16 MED ORDER — DEXAMETHASONE SODIUM PHOSPHATE 10 MG/ML IJ SOLN
6.0000 mg | INTRAMUSCULAR | Status: DC
  Administered 2019-04-16 – 2019-04-20 (×5): 6 mg via INTRAVENOUS
  Filled 2019-04-16 (×5): qty 1

## 2019-04-16 NOTE — ED Provider Notes (Signed)
[facility] EMERGENCY DEPARTMENT Provider Note   CSN: [id number] Arrival date & time: 04/16/19  7681     History   Chief Complaint Chief Complaint  Patient presents with  . covid    HPI [name] is a 53 y.o. female who presents with cc sob. She  has a past medical history of Allergic rhinitis, Allergy, Anxiety, Asthma, Blood transfusion without reported diagnosis, CAD (coronary artery disease), Cataract, Diabetes mellitus, Dyslipidemia, Ejection fraction, Esophageal stricture, Gastroparesis due to DM ([place]), GERD (gastroesophageal reflux disease), Glaucoma, History of heart attack, CABG, Hyperlipidemia, Hypertension, Hypothyroidism, Left-sided chest wall pain, and Myocardial infarction ([place]) (2010).  Patient had onset of flu-like sxs and cough 8 days ago. She was diagnosed with COVID on 11/18 in the op setting. She c/o cough, wheezing for which she has been taking Pulmicort, body aches, chills, loss of smell and taste, loss of appetite, and diarrhea.  She has been taking DayQuil/NyQuil. She has pain with coughing. She feels she is getting worse and it is more difficult to breathe so she came here to ER. She has not had fevers at home.      HPI  Past Medical History:  Diagnosis Date  . Allergic rhinitis   . Allergy   . Anxiety   . Asthma   . Blood transfusion without reported diagnosis    2010 CABG  . CAD (coronary artery disease)    4v CABG, 9/10; NL LVF, status post followup Cardiolite November 2012 no ischemia ejection fraction 65%  . Cataract    are forming  . Diabetes mellitus    Scheduled to go on insulin pump  . Dyslipidemia     LDL 22 mg percent on Crestor  . Ejection fraction   . Esophageal stricture   . Gastroparesis due to DM ([place])   . GERD (gastroesophageal reflux disease)   . Glaucoma   . History of heart attack   . Hx of CABG    September, 2010  . Hyperlipidemia   . Hypertension   . Hypothyroidism   . Left-sided chest wall pain   . Myocardial  infarction ([place]) 2010   2008 x 2 stenst, 209 x 2 stents. 01-2009 CABG x 4     Patient Active Problem List   Diagnosis Date Noted  . Acute bronchitis 07/18/2017  . Oral thrush 04/19/2017  . Chronic seasonal allergic rhinitis 03/19/2017  . Diabetes mellitus with complication ([place])   . Atypical chest pain   . Muscle spasm 08/20/2014  . Obesity, unspecified 08/03/2013  . Hx of CABG   . Ejection fraction   . HTN (hypertension) 08/22/2012  . Hypothyroidism 09/29/2011  . Edema 08/25/2011  . Gastroparesis 09/07/2010  . Hyperlipidemia 02/21/2010  . Muscle spasm of left shoulder 04/29/2009  . PALPITATIONS 04/12/2009  . WHEEZING 04/12/2009  . GERD 01/19/2009  . Chest pain 02/04/2008  . Diabetes mellitus type 2, insulin dependent ([place]) 06/12/2007  . Anxiety disorder 06/12/2007  . Coronary artery disease involving coronary bypass graft of native heart 06/12/2007  . Asthma in adult 06/12/2007    Past Surgical History:  Procedure Laterality Date  . ANGIOPLASTY     with stenting [id number]  . CARDIAC SURGERY    . CESAREAN SECTION    . CORONARY ARTERY BYPASS GRAFT  01/26/2009  . EYE SURGERY    . open heart surgery   2010  . TUBAL LIGATION       OB History   No obstetric history on file.  Home Medications    Prior to Admission medications   Medication Sig Start Date End Date Taking? Authorizing Provider  albuterol (PROVENTIL) (2.5 MG/3ML) 0.083% nebulizer solution Take 2.5 mg by nebulization every 4 (four) hours as needed for wheezing or shortness of breath.     [provider]  albuterol (VENTOLIN HFA) 108 (90 BASE) MCG/ACT inhaler Inhale 2 puffs into the lungs every 6 (six) hours as needed for wheezing or shortness of breath.     [provider]  ALPRAZolam Duanne Moron) 0.25 MG tablet Take 0.25 mg by mouth 2 (two) times daily as needed. 11/21/17   [provider]  aspirin 81 MG chewable tablet Chew 81 mg by mouth daily.    [provider]   azelastine (ASTELIN) 0.1 % nasal spray Place 2 sprays 2 (two) times daily into both nostrils. 04/03/17   [provider]  BREO ELLIPTA 100-25 MCG/INH AEPB TAKE 1 PUFF BY MOUTH EVERY DAY 06/24/18   Collene Gobble, MD  clindamycin (CLEOCIN) 300 MG capsule  06/13/18   [provider]  fish oil-omega-3 fatty acids 1000 MG capsule Take 2 capsules (2 g total) by mouth 2 (two) times daily. 07/03/12   Serpe, Burna Forts, PA-C  furosemide (LASIX) 40 MG tablet TAKE 1 TABLET (40 MG TOTAL) BY MOUTH DAILY. 08/27/18   Herminio Commons, MD  glucose blood (CONTOUR NEXT TEST) test strip TEST BLOOD GLUCOSE EIGHT TIMES DAILY 09/26/16   [provider]  HYDROcodone-acetaminophen (NORCO/VICODIN) 5-325 MG tablet 1 or 2 tabs PO q8 hours prn pain 12/06/17   Francine Graven, DO  hyoscyamine (LEVSIN SL) 0.125 MG SL tablet PLACE 1 TABLET (0.125 MG) UNDER THE TONGUE ONCE TO TWICE A DAY, AS NEEDED 09/27/17   Levin Erp, PA  Insulin Human (INSULIN PUMP) SOLN Pt uses up to 150 units of NOVOLOG daily.    [provider]  lansoprazole (PREVACID SOLUTAB) 30 MG disintegrating tablet Take 1 tablet (30 mg total) by mouth daily. 01/15/19   Armbruster, Carlota Raspberry, MD  levothyroxine (SYNTHROID, LEVOTHROID) 100 MCG tablet Take 100 mcg by mouth daily before breakfast.    [provider]  loratadine (CLARITIN) 10 MG tablet Take 1 tablet (10 mg total) by mouth daily. 07/18/17   Collene Gobble, MD  metoCLOPramide (REGLAN) 5 MG tablet TAKE 1 TABLET (5 MG TOTAL) BY MOUTH 3 (THREE) TIMES DAILY AS NEEDED FOR NAUSEA. 03/13/19   Armbruster, Carlota Raspberry, MD  metoprolol tartrate (LOPRESSOR) 25 MG tablet TAKE 1/2 TABLET BY MOUTH EVERY DAY 12/06/18   Herminio Commons, MD  nitroGLYCERIN (NITROSTAT) 0.4 MG SL tablet Place 1 tablet (0.4 mg total) under the tongue every 5 (five) minutes as needed for chest pain. 09/13/17   Herminio Commons, MD  NOVOLOG 100 UNIT/ML injection Inject 100-150 Units into the skin  as directed. Use up to 100-150 units daily in insulin pump as needed 11/23/17   [provider]  nystatin (MYCOSTATIN) 100000 UNIT/ML suspension Take 5 mLs (500,000 Units total) by mouth 4 (four) times daily. 04/19/17   Javier Glazier, MD  oxyCODONE-acetaminophen (PERCOCET/ROXICET) 5-325 MG tablet Take 1 tablet by mouth 3 (three) times daily as needed. 11/23/17   [provider]  potassium chloride (K-DUR) 10 MEQ tablet Take 1 tablet (10 mEq total) by mouth daily. 06/14/17   Herminio Commons, MD  predniSONE 5 MG/5ML solution Take by mouth. 06/13/18   [provider]  Pseudoeph-Doxylamine-DM-APAP (NYQUIL PO) Take 30 mLs by mouth at  bedtime as needed (for cough).    [provider]  rosuvastatin (CRESTOR) 5 MG tablet Take 1 tablet (5 mg total) by mouth daily. 07/22/18   Herminio Commons, MD  Spacer/Aero Chamber Mouthpiece MISC 1 Device by Does not apply route as directed. 03/19/17   Javier Glazier, MD  sulfamethoxazole-trimethoprim Octavio Graves) 200-40 MG/5ML suspension  07/19/18   [provider]  SUPREP BOWEL PREP KIT 17.5-3.13-1.6 GM/177ML SOLN Suprep-Use as directed 01/15/19   Armbruster, Carlota Raspberry, MD    Family History Family History  Problem Relation Age of Onset  . Heart disease Father   . Asthma Father   . COPD Father   . Diabetes Mother   . Asthma Paternal Aunt   . Colon cancer Neg Hx   . Colon polyps Neg Hx   . Esophageal cancer Neg Hx   . Rectal cancer Neg Hx   . Stomach cancer Neg Hx     Social History Social History   Tobacco Use  . Smoking status: Former Smoker    Packs/day: 1.00    Years: 3.00    Pack years: 3.00    Types: Cigarettes    Start date: 03/14/1991    Quit date: 05/22/1993    Years since quitting: 25.9  . Smokeless tobacco: Never Used  . Tobacco comment:  Year Quit: 1995  Substance Use Topics  . Alcohol use: No    Alcohol/week: 0.0 standard drinks  . Drug use: No     Allergies   Metformin and  Penicillins   Review of Systems Review of Systems   Physical Exam Updated Vital Signs BP (!) 131/54 (BP Location: Left Arm)   Pulse 82   Temp 99.2 F (37.3 C) (Oral)   Resp 18   Ht '5\' 4"'$  (1.626 m)   Wt 105.7 kg   LMP 12/06/2017   SpO2 93%   BMI 39.99 kg/m   Physical Exam   ED Treatments / Results  Labs (all labs ordered are listed, but only abnormal results are displayed) Labs Reviewed  SARS CORONAVIRUS 2 (TAT 6-24 HRS)  CULTURE, BLOOD (ROUTINE X 2)  CULTURE, BLOOD (ROUTINE X 2)  LACTIC ACID, PLASMA  LACTIC ACID, PLASMA  CBC WITH DIFFERENTIAL/PLATELET  COMPREHENSIVE METABOLIC PANEL  D-DIMER, QUANTITATIVE (NOT AT [facility])  PROCALCITONIN  LACTATE DEHYDROGENASE  FERRITIN  TRIGLYCERIDES  FIBRINOGEN  C-REACTIVE PROTEIN  POC SARS CORONAVIRUS 2 AG -  ED    EKG EKG Interpretation  Date/Time:  Wednesday April 16 2019 10:36:16 EST Ventricular Rate:  83 PR Interval:    QRS Duration: 101 QT Interval:  370 QTC Calculation: 435 R Axis:   94 Text Interpretation: Sinus rhythm Borderline right axis deviation Abnormal T, consider ischemia, lateral leads No significant change since 12/07/2018 Confirmed by Veryl Speak [phone]) on 04/16/2019 11:09:26 AM   Radiology No results found.  Procedures Procedures (including critical care time)  Medications Ordered in ED Medications - No data to display   Initial Impression / Assessment and Plan / ED Course  I have reviewed the triage vital signs and the nursing notes.  Pertinent labs & imaging results that were available during my care of the patient were reviewed by me and considered in my medical decision making (see chart for details).        Patient here with recent covid 19  Diagnosis, Hx of type 1 dm and asthma. WOrsening DYspnea.  Review of labs shows elevated blood glucose, elevated alkaline phosphatase.  No elevated white blood cell  count.  Elevated inflammatory markers with CRP elevation.  Covid positive.   Patient with lactic acidosis of 2.3 without evidence of sepsis.  I personally reviewed the patient's chest x-ray which shows patchy airspace opacities consistent with viral pneumonia.  Patient having worsening hypoxia and dyspnea.  Should be admitted to the hospitalist service for continued evaluation and monitoring.  She was treated here with Decadron, fluids and albuterol with improvement  [place] was evaluated in Emergency Department on 04/17/2019 for the symptoms described in the history of present illness. She was evaluated in the context of the global COVID-19 pandemic, which necessitated consideration that the patient might be at risk for infection with the SARS-CoV-2 virus that causes COVID-19. Institutional protocols and algorithms that pertain to the evaluation of patients at risk for COVID-19 are in a state of rapid change based on information released by regulatory bodies including the CDC and federal and state organizations. These policies and algorithms were followed during the patient's care in the ED.   Final Clinical Impressions(s) / ED Diagnoses   Final diagnoses:  None    ED Discharge Orders    None       Margarita Mail, PA-C 04/17/19 2131    Veryl Speak, MD 04/19/19 0225

## 2019-04-16 NOTE — ED Notes (Signed)
O2 on hold.  Sats dropped form 100 % to 93% onroom air.  Pt says she feels better with O2 and after breathing treatment.  Will left O2 off for short period.

## 2019-04-16 NOTE — ED Notes (Signed)
CRITICAL VALUE ALERT  Critical Value:  Lactic Acid 2.3  Date & Time Notied:  04/16/2019 @1229   Provider Notified: Dr Stark Jock  Orders Received/Actions taken: see new orders

## 2019-04-16 NOTE — ED Notes (Signed)
Sats 89-91% on RA.  Placed on O2@2L /M

## 2019-04-16 NOTE — ED Notes (Signed)
Pt given apple sauce and diet sprite to drink.

## 2019-04-16 NOTE — H&P (Signed)
History and Physical    [name] LSL:[id number] DOB: 1966-03-17 DOA: 04/16/2019  PCP: Sherald Hess., MD   Patient coming from: Home  I have personally briefly reviewed patient's old medical records in [place]  Chief Complaint: SOB, cough  HPI: [name] is a 53 y.o. female with medical history significant for diabetes mellitus, coronary artery disease, asthma, hypertension.  Patient presented to the ED with complaints of difficulty breathing, dry cough, with muscle fatigue and pain of about 8 days duration.  Dyspnea is particularly worse with exertion.  Patient had an outpatient Covid test done about a week ago which resulted positive.  She reports loss of taste and smell.  ED Course: Intermittent tachypnea, O2 sats 89 to 91% on room air improved with 2 L nasal cannula.  WBC 10.  Elevated D-dimer 0.54.  Procalcitonin less than 0.1.  Fibrinogen 634, CRP elevated 8.9.  Portable chest x-ray shows hazy airspace opacity within the peripheral aspect of the left lower lobe suspicious of viral pneumonia.  Hospitalist admit for further evaluation and management.  Review of Systems: As per HPI all other systems reviewed and negative.  Past Medical History:  Diagnosis Date  . Allergic rhinitis   . Allergy   . Anxiety   . Asthma   . Blood transfusion without reported diagnosis    2010 CABG  . CAD (coronary artery disease)    4v CABG, 9/10; NL LVF, status post followup Cardiolite November 2012 no ischemia ejection fraction 65%  . Cataract    are forming  . Diabetes mellitus    Scheduled to go on insulin pump  . Dyslipidemia     LDL 22 mg percent on Crestor  . Ejection fraction   . Esophageal stricture   . Gastroparesis due to DM ([place])   . GERD (gastroesophageal reflux disease)   . Glaucoma   . History of heart attack   . Hx of CABG    September, 2010  . Hyperlipidemia   . Hypertension   . Hypothyroidism   . Left-sided chest wall pain   . Myocardial  infarction ([place]) 2010   2008 x 2 stenst, 209 x 2 stents. 01-2009 CABG x 4     Past Surgical History:  Procedure Laterality Date  . ANGIOPLASTY     with stenting 1157,2620  . CARDIAC SURGERY    . CESAREAN SECTION    . CORONARY ARTERY BYPASS GRAFT  01/26/2009  . EYE SURGERY    . open heart surgery   2010  . TUBAL LIGATION       reports that she quit smoking about 25 years ago. Her smoking use included cigarettes. She started smoking about 28 years ago. She has a 3.00 pack-year smoking history. She has never used smokeless tobacco. She reports that she does not drink alcohol or use drugs.  Allergies  Allergen Reactions  . Metformin Anaphylaxis  . Penicillins Anaphylaxis and Other (See Comments)    Has patient had a PCN reaction causing immediate rash, facial/tongue/throat swelling, SOB or lightheadedness with hypotension: Yes Has patient had a PCN reaction causing severe rash involving mucus membranes or skin necrosis: No Has patient had a PCN reaction that required hospitalization No Has patient had a PCN reaction occurring within the last 10 years: No If all of the above answers are "NO", then may proceed with Cephalosporin use.  . Metoclopramide Other (See Comments)    Reaction:  Nightmares     Family History  Problem Relation Age of Onset  . Heart disease Father   . Asthma Father   . COPD Father   . Diabetes Mother   . Asthma Paternal Aunt   . Colon cancer Neg Hx   . Colon polyps Neg Hx   . Esophageal cancer Neg Hx   . Rectal cancer Neg Hx   . Stomach cancer Neg Hx     Prior to Admission medications   Medication Sig Start Date End Date Taking? Authorizing Provider  albuterol (PROVENTIL) (2.5 MG/3ML) 0.083% nebulizer solution Take 2.5 mg by nebulization every 4 (four) hours as needed for wheezing or shortness of breath.    Yes [provider]  albuterol (VENTOLIN HFA) 108 (90 BASE) MCG/ACT inhaler Inhale 2 puffs into the lungs every 6 (six) hours as needed for  wheezing or shortness of breath.    Yes [provider]  ALPRAZolam (XANAX) 0.25 MG tablet Take 0.25 mg by mouth 2 (two) times daily as needed for anxiety or sleep.  11/21/17  Yes [provider]  aspirin 81 MG chewable tablet Chew 81 mg by mouth daily.   Yes [provider]  celecoxib (CELEBREX) 200 MG capsule Take 200 mg by mouth daily. 04/14/19  Yes [provider]  chlorhexidine (PERIDEX) 0.12 % solution Use as directed 15 mLs in the mouth or throat 2 (two) times daily.  04/02/19  Yes [provider]  doxycycline (VIBRAMYCIN) 100 MG capsule Take 100 mg by mouth 2 (two) times daily. 10 day course starting on 04/14/2019 04/14/19  Yes [provider]  furosemide (LASIX) 40 MG tablet TAKE 1 TABLET (40 MG TOTAL) BY MOUTH DAILY. Patient taking differently: Take 20 mg by mouth daily as needed for fluid.  08/27/18  Yes Herminio Commons, MD  hyoscyamine (LEVSIN SL) 0.125 MG SL tablet PLACE 1 TABLET (0.125 MG) UNDER THE TONGUE ONCE TO TWICE A DAY, AS NEEDED Patient taking differently: Take 0.125 mg by mouth 2 (two) times daily.  09/27/17  Yes Levin Erp, PA  Insulin Human (INSULIN PUMP) SOLN Inject into the skin See admin instructions. Pt uses up to 150 units of NOVOLOG daily VIA PUMP   Yes [provider]  lansoprazole (PREVACID SOLUTAB) 30 MG disintegrating tablet Take 1 tablet (30 mg total) by mouth daily. 01/15/19  Yes Armbruster, Carlota Raspberry, MD  levothyroxine (SYNTHROID) 125 MCG tablet Take 125 mcg by mouth every morning.  02/21/19  Yes [provider]  Magnesium Oxide 500 MG CAPS Take 1 capsule by mouth every morning.  04/07/19  Yes [provider]  metFORMIN (GLUCOPHAGE) 500 MG tablet Take 1,000 mg by mouth at bedtime. 04/07/19  Yes [provider]  metoCLOPramide (REGLAN) 5 MG tablet TAKE 1 TABLET (5 MG TOTAL) BY MOUTH 3 (THREE) TIMES DAILY AS NEEDED FOR NAUSEA. 03/13/19  Yes Armbruster, Carlota Raspberry, MD   metoprolol tartrate (LOPRESSOR) 25 MG tablet TAKE 1/2 TABLET BY MOUTH EVERY DAY Patient taking differently: Take 12.5 mg by mouth every morning.  12/06/18  Yes Herminio Commons, MD  nitroGLYCERIN (NITROSTAT) 0.4 MG SL tablet Place 1 tablet (0.4 mg total) under the tongue every 5 (five) minutes as needed for chest pain. 09/13/17  Yes Herminio Commons, MD  NOVOLOG 100 UNIT/ML injection Inject 100-150 Units into the skin as directed. Use up to 100-150 units daily in insulin pump as needed 11/23/17  Yes [provider]  oxyCODONE-acetaminophen (PERCOCET/ROXICET) 5-325 MG tablet Take 1 tablet by mouth 3 (three) times  daily.  11/23/17  Yes [provider]  potassium chloride (K-DUR) 10 MEQ tablet Take 1 tablet (10 mEq total) by mouth daily. 06/14/17  Yes Herminio Commons, MD  Pseudoeph-Doxylamine-DM-APAP (NYQUIL PO) Take 30 mLs by mouth at bedtime as needed (for cough).   Yes [provider]  rosuvastatin (CRESTOR) 5 MG tablet Take 1 tablet (5 mg total) by mouth daily. Patient taking differently: Take 10 mg by mouth daily.  07/22/18  Yes Herminio Commons, MD  sertraline (ZOLOFT) 25 MG tablet Take 25 mg by mouth daily. 04/04/19  Yes [provider]  SYMBICORT 160-4.5 MCG/ACT inhaler Inhale 2 puffs into the lungs 2 (two) times daily.  04/14/19  Yes [provider]  vitamin C (ASCORBIC ACID) 500 MG tablet Take 500 mg by mouth daily.   Yes [provider]  Vitamin D, Ergocalciferol, (DRISDOL) 1.25 MG (50000 UT) CAPS capsule Take 50,000 Units by mouth every Monday.  03/01/19  Yes [provider]  zinc gluconate 50 MG tablet Take 50 mg by mouth daily.   Yes [provider]  BREO ELLIPTA 100-25 MCG/INH AEPB TAKE 1 PUFF BY MOUTH EVERY DAY Patient not taking: Reported on 04/16/2019 06/24/18   Collene Gobble, MD  SUPREP BOWEL PREP KIT 17.5-3.13-1.6 GM/177ML SOLN Suprep-Use as directed Patient not taking: Reported on 04/16/2019 01/15/19    Yetta Flock, MD    Physical Exam: Vitals:   04/16/19 1600 04/16/19 1630 04/16/19 1700 04/16/19 1730  BP: (!) 121/52 (!) 131/117 (!) 127/48 124/67  Pulse: 77 88 78 83  Resp: (!) 22 (!) 26 (!) 23 19  Temp:      TempSrc:      SpO2: 97% 94% 97% 94%  Weight:      Height:        Constitutional: NAD, calm, comfortable Vitals:   04/16/19 1600 04/16/19 1630 04/16/19 1700 04/16/19 1730  BP: (!) 121/52 (!) 131/117 (!) 127/48 124/67  Pulse: 77 88 78 83  Resp: (!) 22 (!) 26 (!) 23 19  Temp:      TempSrc:      SpO2: 97% 94% 97% 94%  Weight:      Height:       Eyes: PERRL, lids and conjunctivae normal ENMT: Mucous membranes are moist. Posterior pharynx clear of any exudate or lesions.  Neck: normal, supple, no masses, no thyromegaly Respiratory:Normal respiratory effort. No accessory muscle use.  Cardiovascular: Regular rate and rhythm, no murmurs / rubs / gallops. No extremity edema. 2+ pedal pulses.  Abdomen: Full, no tenderness, no masses palpated. No hepatosplenomegaly. Bowel sounds positive.  Musculoskeletal: no clubbing / cyanosis. No joint deformity upper and lower extremities. Good ROM, no contractures. Normal muscle tone.  Skin: no rashes, lesions, ulcers. No induration Neurologic: CN 2-12 grossly intact.  Strength 5/5 in all 4.  Psychiatric: Normal judgment and insight. Alert and oriented x 3. Normal mood.   Labs on Admission: I have personally reviewed following labs and imaging studies  CBC: Recent Labs  Lab 04/16/19 1143  WBC 10.0  NEUTROABS 7.9*  HGB 13.0  HCT 40.8  MCV 86.4  PLT 833   Basic Metabolic Panel: Recent Labs  Lab 04/16/19 1143  NA 134*  K 3.9  CL 100  CO2 22  GLUCOSE 275*  BUN 10  CREATININE 0.72  CALCIUM 9.2   Liver Function Tests: Recent Labs  Lab 04/16/19 1143  AST 37  ALT 27  ALKPHOS 174*  BILITOT 0.6  PROT 7.4  ALBUMIN 3.6   CBG: Recent Labs  Lab 04/16/19 1729  GLUCAP 169*   Lipid Profile: Recent Labs     04/16/19 1143  TRIG 193*   Anemia Panel: Recent Labs    04/16/19 1143  FERRITIN 94   Urine analysis:    Component Value Date/Time   COLORURINE YELLOW 04/13/2016 0319   APPEARANCEUR CLEAR 04/13/2016 0319   LABSPEC 1.009 04/13/2016 0319   PHURINE 7.5 04/13/2016 0319   GLUCOSEU NEGATIVE 04/13/2016 0319   HGBUR NEGATIVE 04/13/2016 0319   BILIRUBINUR NEGATIVE 04/13/2016 0319   KETONESUR NEGATIVE 04/13/2016 0319   PROTEINUR NEGATIVE 04/13/2016 0319   UROBILINOGEN 0.2 08/21/2011 1741   NITRITE NEGATIVE 04/13/2016 0319   LEUKOCYTESUR NEGATIVE 04/13/2016 0319    Radiological Exams on Admission: Dg Chest Port 1 View  Result Date: 04/16/2019 CLINICAL DATA:  COVID-19 positive, fatigue EXAM: PORTABLE CHEST 1 VIEW COMPARISON:  12/06/2017 FINDINGS: Post CABG changes. Stable cardiomediastinal contours. Hazy airspace opacity within the peripheral aspect of the left lower lobe. No pleural effusion. No pneumothorax. IMPRESSION: Hazy airspace opacity within the peripheral aspect of the left lower lobe suspicious for viral pneumonia given the patient's history. Electronically Signed   By: Davina Poke M.D.   On: 04/16/2019 11:36    EKG: Independently reviewed.  Sinus rhythm.  Old and unchanged T wave abnormalities in lead I, aVL and V2.  Assessment/Plan Active Problems:   Pneumonia due to COVID-19 virus   Pneumonia due to COVID-19 virus-Covid positive a week ago 04/09/2019, persistent and worsening symptoms since then.  Mild hypoxia O2 sats 89 to 91% in ED. History of asthma.  D-dimer, fibrinogen and CRP elevated. Chest xray shows hazy air space opacity within the peripheral aspect of the left lower lobe suspicious for viral pneumonia.  - Remdesivir, pharm to dose - Dexamethasone 7m daily - Daily CMP, CBC, D-dimer, CRP -Covid admission protocol -As needed mucolytics, albuterol inhaler, supplemental O2. -Resume home bronchodilators  HTN-stable. -Resume home metoprolol, Lasix.   DM-random glucose 275. -Continue home insulin pump -Hold home Metformin -Check blood sugars before meals at bedtime -Anticipate hyperglycemia while on steroids - Hgba1c  CAD-history of CABG 2010.  EKG unchanged. -Resume home aspirin, beta-blocker, Crestor,  Anxiety -Resume home Zoloft   DVT prophylaxis: Lovenox Code Status: Full code Family Communication: None at bedside  disposition Plan: Per rounding team Consults called: None Admission status: Inpatient, telemetry I certify that at the point of admission it is my clinical judgment that the patient will require inpatient hospital care spanning beyond 2 midnights from the point of admission due to high intensity of service, high risk for further deterioration and high frequency of surveillance required. The following factors support the patient status of inpatient: COVID-19 pneumonia requiring IV antivirals or steroids.   EBethena RoysMD [facility]  04/16/2019, 6:38 PM

## 2019-04-16 NOTE — ED Triage Notes (Signed)
plt reports covid symptoms since last Tuesday.  Tested positive for covid Monday.  Pt says has no taste or smell, c/o fatigue, muscle pain, sob.  Has been on antibiotics, inhaler, and vitamins since Monday.

## 2019-04-17 DIAGNOSIS — J9601 Acute respiratory failure with hypoxia: Secondary | ICD-10-CM

## 2019-04-17 DIAGNOSIS — E10649 Type 1 diabetes mellitus with hypoglycemia without coma: Secondary | ICD-10-CM

## 2019-04-17 DIAGNOSIS — U071 COVID-19: Secondary | ICD-10-CM | POA: Insufficient documentation

## 2019-04-17 LAB — COMPREHENSIVE METABOLIC PANEL
ALT: 25 U/L (ref 0–44)
AST: 29 U/L (ref 15–41)
Albumin: 3.5 g/dL (ref 3.5–5.0)
Alkaline Phosphatase: 169 U/L — ABNORMAL HIGH (ref 38–126)
Anion gap: 10 (ref 5–15)
BUN: 11 mg/dL (ref 6–20)
CO2: 21 mmol/L — ABNORMAL LOW (ref 22–32)
Calcium: 9.1 mg/dL (ref 8.9–10.3)
Chloride: 103 mmol/L (ref 98–111)
Creatinine, Ser: 0.68 mg/dL (ref 0.44–1.00)
GFR calc Af Amer: >60 mL/min (ref >60)
GFR calc non Af Amer: >60 mL/min (ref >60)
Glucose, Bld: 311 mg/dL — ABNORMAL HIGH (ref 70–99)
Potassium: 4.3 mmol/L (ref 3.5–5.1)
Sodium: 134 mmol/L — ABNORMAL LOW (ref 135–145)
Total Bilirubin: 0.7 mg/dL (ref 0.3–1.2)
Total Protein: 7.4 g/dL (ref 6.5–8.1)

## 2019-04-17 LAB — CBC WITH DIFFERENTIAL/PLATELET
Abs Immature Granulocytes: 0.03 10*3/uL (ref 0.00–0.07)
Basophils Absolute: 0 10*3/uL (ref 0.0–0.1)
Basophils Relative: 0 %
Eosinophils Absolute: 0 10*3/uL (ref 0.0–0.5)
Eosinophils Relative: 0 %
HCT: 42.9 % (ref 36.0–46.0)
Hemoglobin: 13.5 g/dL (ref 12.0–15.0)
Immature Granulocytes: 0 %
Lymphocytes Relative: 16 %
Lymphs Abs: 1.3 10*3/uL (ref 0.7–4.0)
MCH: 27.5 pg (ref 26.0–34.0)
MCHC: 31.5 g/dL (ref 30.0–36.0)
MCV: 87.4 fL (ref 80.0–100.0)
Monocytes Absolute: 0.2 10*3/uL (ref 0.1–1.0)
Monocytes Relative: 2 %
Neutro Abs: 6.8 10*3/uL (ref 1.7–7.7)
Neutrophils Relative %: 82 %
Platelets: 272 10*3/uL (ref 150–400)
RBC: 4.91 MIL/uL (ref 3.87–5.11)
RDW: 14.6 % (ref 11.5–15.5)
WBC: 8.4 10*3/uL (ref 4.0–10.5)
nRBC: 0 % (ref 0.0–0.2)

## 2019-04-17 LAB — HIV ANTIBODY (ROUTINE TESTING W REFLEX): HIV Screen 4th Generation wRfx: NONREACTIVE

## 2019-04-17 LAB — GLUCOSE, CAPILLARY
Glucose-Capillary: 296 mg/dL — ABNORMAL HIGH (ref 70–99)
Glucose-Capillary: 280 mg/dL — ABNORMAL HIGH (ref 70–99)
Glucose-Capillary: 345 mg/dL — ABNORMAL HIGH (ref 70–99)

## 2019-04-17 LAB — HEMOGLOBIN A1C
Hgb A1c MFr Bld: 9.2 % — ABNORMAL HIGH (ref 4.8–5.6)
Mean Plasma Glucose: 217.34 mg/dL

## 2019-04-17 LAB — FERRITIN: Ferritin: 100 ng/mL (ref 11–307)

## 2019-04-17 LAB — D-DIMER, QUANTITATIVE: D-Dimer, Quant: 0.46 ug/mL-FEU (ref 0.00–0.50)

## 2019-04-17 LAB — CBG MONITORING, ED: Glucose-Capillary: 246 mg/dL — ABNORMAL HIGH (ref 70–99)

## 2019-04-17 LAB — ABO/RH: ABO/RH(D): A POS

## 2019-04-17 LAB — C-REACTIVE PROTEIN: CRP: 10.4 mg/dL — ABNORMAL HIGH (ref <1.0)

## 2019-04-17 MED ORDER — DM-GUAIFENESIN ER 30-600 MG PO TB12
1.0000 | ORAL_TABLET | Freq: Two times a day (BID) | ORAL | Status: DC
  Filled 2019-04-17: qty 1

## 2019-04-17 MED ORDER — ZINC SULFATE 220 (50 ZN) MG PO CAPS
220.0000 mg | ORAL_CAPSULE | Freq: Every day | ORAL | Status: DC
  Administered 2019-04-18 – 2019-04-21 (×4): 220 mg via ORAL
  Filled 2019-04-17 (×4): qty 1

## 2019-04-17 MED ORDER — POLYETHYLENE GLYCOL 3350 17 G PO PACK
17.0000 g | PACK | Freq: Every day | ORAL | Status: DC | PRN
  Administered 2019-04-19 – 2019-04-20 (×2): 17 g via ORAL
  Filled 2019-04-17 (×2): qty 1

## 2019-04-17 MED ORDER — ENOXAPARIN SODIUM 60 MG/0.6ML ~~LOC~~ SOLN
0.5000 mg/kg | SUBCUTANEOUS | Status: DC
  Administered 2019-04-18 – 2019-04-21 (×4): 55 mg via SUBCUTANEOUS
  Filled 2019-04-17 (×5): qty 0.6

## 2019-04-17 MED ORDER — ONDANSETRON HCL 4 MG/2ML IJ SOLN: 4.0000 mg | Freq: Four times a day (QID) | INTRAMUSCULAR | Status: DC | PRN

## 2019-04-17 MED ORDER — MAGNESIUM OXIDE 400 (241.3 MG) MG PO TABS
400.0000 mg | ORAL_TABLET | Freq: Every morning | ORAL | Status: DC
  Administered 2019-04-18 – 2019-04-21 (×4): 400 mg via ORAL
  Filled 2019-04-17 (×4): qty 1

## 2019-04-17 MED ORDER — VITAMIN C 500 MG PO TABS
500.0000 mg | ORAL_TABLET | Freq: Every day | ORAL | Status: DC
  Administered 2019-04-18 – 2019-04-21 (×4): 500 mg via ORAL
  Filled 2019-04-17 (×4): qty 1

## 2019-04-17 MED ORDER — LANSOPRAZOLE 30 MG PO TBDD: 30.0000 mg | DELAYED_RELEASE_TABLET | Freq: Every day | ORAL | Status: DC

## 2019-04-17 MED ORDER — MOMETASONE FURO-FORMOTEROL FUM 200-5 MCG/ACT IN AERO
2.0000 | INHALATION_SPRAY | Freq: Two times a day (BID) | RESPIRATORY_TRACT | Status: DC
  Administered 2019-04-17 – 2019-04-21 (×9): 2 via RESPIRATORY_TRACT
  Filled 2019-04-17: qty 8.8

## 2019-04-17 MED ORDER — ACETAMINOPHEN 325 MG PO TABS: 650.0000 mg | ORAL_TABLET | Freq: Four times a day (QID) | ORAL | Status: DC | PRN

## 2019-04-17 MED ORDER — LEVOTHYROXINE SODIUM 25 MCG PO TABS
125.0000 ug | ORAL_TABLET | Freq: Every morning | ORAL | Status: DC
  Administered 2019-04-18 – 2019-04-21 (×4): 125 ug via ORAL
  Filled 2019-04-17 (×4): qty 1

## 2019-04-17 MED ORDER — INSULIN PUMP
SUBCUTANEOUS | Status: DC
  Administered 2019-04-17 – 2019-04-20 (×22): via SUBCUTANEOUS

## 2019-04-17 MED ORDER — ALBUTEROL SULFATE HFA 108 (90 BASE) MCG/ACT IN AERS
2.0000 | INHALATION_SPRAY | RESPIRATORY_TRACT | Status: DC | PRN
  Filled 2019-04-17: qty 6.7

## 2019-04-17 MED ORDER — GUAIFENESIN-DM 100-10 MG/5ML PO SYRP
15.0000 mL | ORAL_SOLUTION | Freq: Three times a day (TID) | ORAL | Status: DC
  Administered 2019-04-17 – 2019-04-20 (×8): 15 mL via ORAL
  Filled 2019-04-17 (×8): qty 15

## 2019-04-17 MED ORDER — ENOXAPARIN SODIUM 40 MG/0.4ML ~~LOC~~ SOLN: 40.0000 mg | SUBCUTANEOUS | Status: DC

## 2019-04-17 MED ORDER — ASPIRIN 81 MG PO CHEW
81.0000 mg | CHEWABLE_TABLET | Freq: Every day | ORAL | Status: DC
  Administered 2019-04-18 – 2019-04-21 (×4): 81 mg via ORAL
  Filled 2019-04-17 (×4): qty 1

## 2019-04-17 MED ORDER — OXYCODONE-ACETAMINOPHEN 5-325 MG PO TABS
1.0000 | ORAL_TABLET | Freq: Three times a day (TID) | ORAL | Status: DC | PRN
  Administered 2019-04-18: 1 via ORAL
  Filled 2019-04-17: qty 1

## 2019-04-17 MED ORDER — METOPROLOL TARTRATE 12.5 MG HALF TABLET
12.5000 mg | ORAL_TABLET | Freq: Every morning | ORAL | Status: DC
  Administered 2019-04-18 – 2019-04-21 (×4): 12.5 mg via ORAL
  Filled 2019-04-17 (×4): qty 1

## 2019-04-17 MED ORDER — ZINC GLUCONATE 50 MG PO TABS: 50.0000 mg | ORAL_TABLET | Freq: Every day | ORAL | Status: DC

## 2019-04-17 MED ORDER — ALPRAZOLAM 0.25 MG PO TABS
0.2500 mg | ORAL_TABLET | Freq: Two times a day (BID) | ORAL | Status: DC | PRN
  Administered 2019-04-18 – 2019-04-20 (×3): 0.25 mg via ORAL
  Filled 2019-04-17 (×3): qty 1

## 2019-04-17 MED ORDER — SERTRALINE HCL 25 MG PO TABS
25.0000 mg | ORAL_TABLET | Freq: Every day | ORAL | Status: DC
  Administered 2019-04-18 – 2019-04-21 (×4): 25 mg via ORAL
  Filled 2019-04-17 (×4): qty 1

## 2019-04-17 MED ORDER — PANTOPRAZOLE SODIUM 40 MG PO TBEC
40.0000 mg | DELAYED_RELEASE_TABLET | Freq: Every day | ORAL | Status: DC
  Administered 2019-04-18 – 2019-04-21 (×4): 40 mg via ORAL
  Filled 2019-04-17 (×4): qty 1

## 2019-04-17 MED ORDER — ONDANSETRON HCL 4 MG PO TABS
4.0000 mg | ORAL_TABLET | Freq: Four times a day (QID) | ORAL | Status: DC | PRN
  Administered 2019-04-20: 4 mg via ORAL
  Filled 2019-04-17: qty 1

## 2019-04-17 MED ORDER — ROSUVASTATIN CALCIUM 5 MG PO TABS
10.0000 mg | ORAL_TABLET | Freq: Every day | ORAL | Status: DC
  Administered 2019-04-18 – 2019-04-21 (×4): 10 mg via ORAL
  Filled 2019-04-17 (×4): qty 2

## 2019-04-17 MED ORDER — ALBUTEROL SULFATE HFA 108 (90 BASE) MCG/ACT IN AERS
2.0000 | INHALATION_SPRAY | Freq: Four times a day (QID) | RESPIRATORY_TRACT | Status: DC | PRN
  Filled 2019-04-17: qty 6.7

## 2019-04-17 NOTE — Progress Notes (Signed)
Subjective: [facility], see detailed note by Dr. Carles Collet. 53 year old female with a history of type 1 diabetes mellitus, hypertension, hypothyroidism, hyperlipidemia, CAD, anxiety came with some general history of shortness of breath, nonproductive cough, myalgia, arthralgia, decreased p.o. intake.  She was tested positive for Covid on 04/09/2019.  She has been on quarantine at home since that time.  She came with shortness of breath, O2 sats were 89 to 91% on room air.  She was placed on supplemental oxygen saturation 9690% on 2 L.  Chest x-ray showed bibasilar hazy opacities.  Vitals:   04/17/19 0930 04/17/19 1029  BP:  (!) 141/61  Pulse: 84 83  Resp: 17 20  Temp:  98.9 F (37.2 C)  SpO2: 98% 98%      A/P COVID-19 pneumonia Raised inflammatory markers, CRP 10.4, ferritin 100, LDH 163, fibrinogen 634. Started on remdesivir and dexamethasone    [place] [facility] [phone]

## 2019-04-17 NOTE — Progress Notes (Signed)
Patient CBG was 236 with her glucometer Patient administered 12 units of insulin via her insulin pump.   Care order in [place]

## 2019-04-17 NOTE — Progress Notes (Signed)
PROGRESS NOTE  [name] OEV:[id number] DOB: 03-Jan-1966 DOA: 04/16/2019 PCP: Frederich Chick., MD  Brief History:  53 year old female with a history of type 1 diabetes mellitus, hypertension, hypothyroidism, hyperlipidemia, coronary artery disease, anxiety presenting with 7 to 10-day history of shortness of breath, nonproductive cough, myalgias, arthralgias and decreased oral intake.  The patient had a positive Covid test on 04/09/2019.  She has been quarantine at home since that time.  However she continued to have worsening generalized weakness and shortness of breath.  As result, the patient presented for further evaluation.  She denies any nausea, vomiting, diarrhea, abdominal pain, hemoptysis, hematochezia, melena. In the emergency department, the patient had low-grade temperature of 99.2 F but was hemodynamically stable.  Oxygen saturation was 89-91% room air.  She was placed on supplemental oxygen with saturations 96-98% on 2 L.  Chest x-ray showed basilar hazy opacities.  BMP was essentially unremarkable.  WBC 10.0, hemoglobin 13.5, platelets 272,000.  Repeat SARS-CoV2 is positive.  Patient was started on dexamethasone and remdesivir.  Inflammatory markers were elevated as discussed below.  Assessment/Plan: Acute Respiratory Failure due to COVID-19 virus -stable on 2L -Continue remdesivir and dexamethasone -Personally reviewed chest x-ray--bibasilar hazy opacities, left greater than right  COVID-19 pneumonia -Ferritin 94>>>100 -Procalcitonin<0.10 -CRP 8.9>>> 10.4 -LDH 163 -D-dimer 0.54 -Fibrinogen 634  Chest pain/coronary artery disease -Initial troponin IV -Cycle troponins -Personally reviewed EKG--sinus rhythm, nonspecific T wave changes  Diabetes mellitus type 1, uncontrolled with hyperglycemia -Continue insulin pump -Carb ratio 3.5 -Insulin sensitivity factor 22 -Patient is unsure of her basal rate -02/27/19--A1C--8.4  Essential hypertension  -Continue metoprolol  Hypothyroidism -Continue Synthroid   GERD -continue PPI  Hyperlipidemia -Restart statin        Disposition Plan:  Transfer to [facility] Family Communication:   No Family at bedside  Consultants:  none  Code Status:  FULL / DNR  DVT Prophylaxis:  [place] Heparin / [place] Lovenox   Procedures: As Listed in Progress Note Above  Antibiotics: None       Subjective: Pt remains sob, about same as yesterday.  She has nonproductive cough.  Denies f/c, n/v/d, abd pain, hemoptysis.  Objective: Vitals:   04/17/19 0400 04/17/19 0600 04/17/19 0800 04/17/19 0930  BP: 132/62 (!) 114/49 125/68   Pulse:  68  84  Resp: 18 20 20 17   Temp:      TempSrc:      SpO2:  94%  98%  Weight:      Height:        Intake/Output Summary (Last 24 hours) at 04/17/2019 0935 Last data filed at 04/16/2019 1800 Gross per 24 hour  Intake 250 ml  Output -  Net 250 ml   Weight change:  Exam:   General:  Pt is alert, follows commands appropriately, not in acute distress  HEENT: No icterus, No thrush, No neck mass, [place]/AT  Cardiovascular: RRR, S1/S2, no rubs, no gallops  Respiratory: bibasilar crackles, no wheeze  Abdomen: Soft/+BS, non tender, non distended, no guarding  Extremities: No edema, No lymphangitis, No petechiae, No rashes, no synovitis   Data Reviewed: I have personally reviewed following labs and imaging studies Basic Metabolic Panel: Recent Labs  Lab 04/16/19 1143 04/17/19 0317  NA 134* 134*  K 3.9 4.3  CL 100 103  CO2 22 21*  GLUCOSE 275* 311*  BUN 10 11  CREATININE 0.72 0.68  CALCIUM 9.2 9.1   Liver Function Tests: Recent Labs  Lab 04/16/19 1143 04/17/19 0317  AST 37 29  ALT 27 25  ALKPHOS 174* 169*  BILITOT 0.6 0.7  PROT 7.4 7.4  ALBUMIN 3.6 3.5   No results for input(s): LIPASE, AMYLASE in the last 168 hours. No results for input(s): AMMONIA in the last 168 hours. Coagulation Profile: No results for input(s): INR, PROTIME in  the last 168 hours. CBC: Recent Labs  Lab 04/16/19 1143 04/17/19 0317  WBC 10.0 8.4  NEUTROABS 7.9* 6.8  HGB 13.0 13.5  HCT 40.8 42.9  MCV 86.4 87.4  PLT 237 272   Cardiac Enzymes: No results for input(s): CKTOTAL, CKMB, CKMBINDEX, TROPONINI in the last 168 hours. BNP: Invalid input(s): POCBNP CBG: Recent Labs  Lab 04/16/19 1729  GLUCAP 169*   HbA1C: No results for input(s): HGBA1C in the last 72 hours. Urine analysis:    Component Value Date/Time   COLORURINE YELLOW 04/13/2016 0319   APPEARANCEUR CLEAR 04/13/2016 0319   LABSPEC 1.009 04/13/2016 0319   PHURINE 7.5 04/13/2016 0319   GLUCOSEU NEGATIVE 04/13/2016 0319   HGBUR NEGATIVE 04/13/2016 0319   BILIRUBINUR NEGATIVE 04/13/2016 0319   KETONESUR NEGATIVE 04/13/2016 0319   PROTEINUR NEGATIVE 04/13/2016 0319   UROBILINOGEN 0.2 08/21/2011 1741   NITRITE NEGATIVE 04/13/2016 0319   LEUKOCYTESUR NEGATIVE 04/13/2016 0319   Sepsis Labs: @LABRCNTIP (procalcitonin:4,lacticidven:4) ) Recent Results (from the past 240 hour(s))  SARS CORONAVIRUS 2 (Sayaka Hoeppner 6-24 HRS) Nasopharyngeal Nasopharyngeal Swab     Status: Abnormal   Collection Time: 04/16/19 10:41 AM   Specimen: Nasopharyngeal Swab  Result Value Ref Range Status   SARS Coronavirus 2 POSITIVE (A) NEGATIVE Final    Comment: RESULT CALLED TO, READ BACK BY AND VERIFIED WITH: B MYRICK,RN 2010 04/16/2019 D BRADLEY (NOTE) SARS-CoV-2 target nucleic acids are DETECTED. The SARS-CoV-2 RNA is generally detectable in upper and lower respiratory specimens during the acute phase of infection. Positive results are indicative of the presence of SARS-CoV-2 RNA. Clinical correlation with patient history and other diagnostic information is  necessary to determine patient infection status. Positive results do not rule out bacterial infection or co-infection with other viruses.  The expected result is Negative. Fact Sheet for Patients: SugarRoll.be Fact  Sheet for Healthcare Providers: https://www.woods-mathews.com/ This test is not yet approved or cleared by the [place] FDA and  has been authorized for detection and/or diagnosis of SARS-CoV-2 by FDA under an Emergency Use Authorization (EUA). This EUA will remain  in effect (meaning this test can be used) for t he duration of the COVID-19 declaration under Section 564(b)(1) of the Act, 21 U.S.C. section 360bbb-3(b)(1), unless the authorization is terminated or revoked sooner. Performed at [facility], [place] [address] [place], [place] [address]   Blood Culture (routine x 2)     Status: None (Preliminary result)   Collection Time: 04/16/19 11:35 AM   Specimen: BLOOD RIGHT WRIST  Result Value Ref Range Status   Specimen Description BLOOD RIGHT WRIST  Final   Special Requests   Final    BOTTLES DRAWN AEROBIC AND ANAEROBIC Blood Culture adequate volume   Culture   Final    NO GROWTH < 24 HOURS Performed at [facility], [address], [place], [place] [address]    Report Status PENDING  Incomplete  Blood Culture (routine x 2)     Status: None (Preliminary result)   Collection Time: 04/16/19 11:50 AM   Specimen: BLOOD RIGHT FOREARM  Result Value Ref Range Status   Specimen Description BLOOD RIGHT FOREARM  Final   Special Requests   Final    BOTTLES DRAWN AEROBIC AND ANAEROBIC Blood Culture adequate volume   Culture   Final    NO GROWTH < 24 HOURS Performed at [facility], [address] [place], [place] [address]    Report Status PENDING  Incomplete     Scheduled Meds: . aspirin  81 mg Oral Daily  . dexamethasone (DECADRON) injection  6 mg Intravenous Q24H  . dextromethorphan-guaiFENesin  1 tablet Oral BID  . enoxaparin (LOVENOX) injection  40 mg Subcutaneous Q24H  . insulin pump   Subcutaneous See admin instructions  . levothyroxine  125 mcg Oral q morning - 10a  . magnesium oxide  400 mg Oral q morning - 10a  . metoprolol tartrate  12.5 mg Oral q  morning - 10a  . mometasone-formoterol  2 puff Inhalation BID  . pantoprazole  40 mg Oral Daily  . rosuvastatin  10 mg Oral Daily  . sertraline  25 mg Oral Daily  . vitamin C  500 mg Oral Daily  . zinc gluconate  50 mg Oral Daily   Continuous Infusions:  Procedures/Studies: Dg Chest Port 1 View  Result Date: 04/16/2019 CLINICAL DATA:  COVID-19 positive, fatigue EXAM: PORTABLE CHEST 1 VIEW COMPARISON:  12/06/2017 FINDINGS: Post CABG changes. Stable cardiomediastinal contours. Hazy airspace opacity within the peripheral aspect of the left lower lobe. No pleural effusion. No pneumothorax. IMPRESSION: Hazy airspace opacity within the peripheral aspect of the left lower lobe suspicious for viral pneumonia given the patient's history. Electronically Signed   By: Duanne GuessNicholas  Plundo M.D.   On: 04/16/2019 11:36    Catarina Hartshornavid Ulric Salzman, DO  [facility] Pager [phone]  If 7PM-7AM, please contact night-coverage [email or web address] Password [email or web address] 04/17/2019, 9:35 AM   LOS: 1 day

## 2019-04-18 LAB — CBC WITH DIFFERENTIAL/PLATELET
Abs Immature Granulocytes: 0.11 10*3/uL — ABNORMAL HIGH (ref 0.00–0.07)
Basophils Absolute: 0 10*3/uL (ref 0.0–0.1)
Basophils Relative: 0 %
Eosinophils Absolute: 0 10*3/uL (ref 0.0–0.5)
Eosinophils Relative: 0 %
HCT: 38.5 % (ref 36.0–46.0)
Hemoglobin: 12.6 g/dL (ref 12.0–15.0)
Immature Granulocytes: 1 %
Lymphocytes Relative: 14 %
Lymphs Abs: 1.7 10*3/uL (ref 0.7–4.0)
MCH: 27.5 pg (ref 26.0–34.0)
MCHC: 32.7 g/dL (ref 30.0–36.0)
MCV: 84.1 fL (ref 80.0–100.0)
Monocytes Absolute: 0.7 10*3/uL (ref 0.1–1.0)
Monocytes Relative: 5 %
Neutro Abs: 9.7 10*3/uL — ABNORMAL HIGH (ref 1.7–7.7)
Neutrophils Relative %: 80 %
Platelets: 312 10*3/uL (ref 150–400)
RBC: 4.58 MIL/uL (ref 3.87–5.11)
RDW: 14.5 % (ref 11.5–15.5)
WBC: 12.2 10*3/uL — ABNORMAL HIGH (ref 4.0–10.5)
nRBC: 0 % (ref 0.0–0.2)

## 2019-04-18 LAB — GLUCOSE, CAPILLARY
Glucose-Capillary: 196 mg/dL — ABNORMAL HIGH (ref 70–99)
Glucose-Capillary: 182 mg/dL — ABNORMAL HIGH (ref 70–99)
Glucose-Capillary: 160 mg/dL — ABNORMAL HIGH (ref 70–99)
Glucose-Capillary: 219 mg/dL — ABNORMAL HIGH (ref 70–99)

## 2019-04-18 LAB — COMPREHENSIVE METABOLIC PANEL
ALT: 22 U/L (ref 0–44)
AST: 30 U/L (ref 15–41)
Albumin: 3.3 g/dL — ABNORMAL LOW (ref 3.5–5.0)
Alkaline Phosphatase: 161 U/L — ABNORMAL HIGH (ref 38–126)
Anion gap: 14 (ref 5–15)
BUN: 12 mg/dL (ref 6–20)
CO2: 22 mmol/L (ref 22–32)
Calcium: 9.5 mg/dL (ref 8.9–10.3)
Chloride: 99 mmol/L (ref 98–111)
Creatinine, Ser: 0.74 mg/dL (ref 0.44–1.00)
GFR calc Af Amer: >60 mL/min (ref >60)
GFR calc non Af Amer: >60 mL/min (ref >60)
Glucose, Bld: 243 mg/dL — ABNORMAL HIGH (ref 70–99)
Potassium: 4 mmol/L (ref 3.5–5.1)
Sodium: 135 mmol/L (ref 135–145)
Total Bilirubin: 0.6 mg/dL (ref 0.3–1.2)
Total Protein: 6.9 g/dL (ref 6.5–8.1)

## 2019-04-18 LAB — MAGNESIUM: Magnesium: 1.9 mg/dL (ref 1.7–2.4)

## 2019-04-18 LAB — FERRITIN: Ferritin: 125 ng/mL (ref 11–307)

## 2019-04-18 LAB — D-DIMER, QUANTITATIVE: D-Dimer, Quant: 0.46 ug/mL-FEU (ref 0.00–0.50)

## 2019-04-18 LAB — C-REACTIVE PROTEIN: CRP: 4.6 mg/dL — ABNORMAL HIGH (ref <1.0)

## 2019-04-18 MED ORDER — SODIUM CHLORIDE 0.9 % IV SOLN
100.0000 mg | INTRAVENOUS | Status: AC
  Administered 2019-04-18 – 2019-04-21 (×4): 100 mg via INTRAVENOUS
  Filled 2019-04-18 (×3): qty 20
  Filled 2019-04-18: qty 100

## 2019-04-18 NOTE — Progress Notes (Signed)
[facility]  PROGRESS NOTE  [name] GHW:[id number] DOB: [birth year] DOA: 04/16/2019 PCP: Sherald Hess., MD   Brief HPI:   53 year old female with a history of type 1 diabetes mellitus, hypertension, hypothyroidism, hyperlipidemia, CAD, anxiety came with some general history of shortness of breath, nonproductive cough, myalgia, arthralgia, decreased p.o. intake.  She was tested positive for Covid on 04/09/2019.  She has been on quarantine at home since that time.  She came with shortness of breath, O2 sats were 89 to 91% on room air.  She was placed on supplemental oxygen saturation 96- 98% on 2 L.  Chest x-ray showed bibasilar hazy opacities.Raised inflammatory markers, CRP 10.4, ferritin 100, LDH 163, fibrinogen 634. Started on remdesivir and dexamethasone    Subjective   Patient seen and examined, breathing somewhat better today.   Assessment/Plan:     1. Acute hypoxic respiratory failure-secondary to COVID-19 pneumonia, started on remdesivir and Decadron.  Chest x-ray showed bibasilar hazy opacities.  CRP is down to 4.6.  Ferritin 125, D-dimer 0.46.  Continue zinc and vitamin C.  Follow CRP in a.m.  2. Diabetes mellitus type 1-patient is currently on insulin pump.  Continue to use insulin pump, CBG before every meal and at bedtime.  3. Hypertension-blood pressure is stable, continue metoprolol.  4. Hypothyroidism-continue Synthroid.  5. Hyperlipidemia-continue statin   CBG: Recent Labs  Lab 04/17/19 1208 04/17/19 1551 04/17/19 2108 04/18/19 0744 04/18/19 1130  GLUCAP 296* 280* 345* 196* 182*    CBC: Recent Labs  Lab 04/16/19 1143 04/17/19 0317 04/18/19 0456  WBC 10.0 8.4 12.2*  NEUTROABS 7.9* 6.8 9.7*  HGB 13.0 13.5 12.6  HCT 40.8 42.9 38.5  MCV 86.4 87.4 84.1  PLT 237 272 789    Basic Metabolic Panel: Recent Labs  Lab 04/16/19 1143 04/17/19 0317 04/18/19 0456  NA 134* 134* 135  K 3.9 4.3 4.0  CL 100 103 99  CO2 22 21* 22   GLUCOSE 275* 311* 243*  BUN 10 11 12   CREATININE 0.72 0.68 0.74  CALCIUM 9.2 9.1 9.5  MG  --   --  1.9    COVID-19 Labs  Recent Labs    04/16/19 1143 04/17/19 0317 04/18/19 0456  DDIMER 0.54* 0.46 0.46  FERRITIN 94 100 125  LDH 163  --   --   CRP 8.9* 10.4* 4.6*    Lab Results  Component Value Date   SARSCOV2NAA POSITIVE (A) 04/16/2019     DVT prophylaxis: Lovenox  Code Status: Full code  Family Communication: No family at bedside  Disposition Plan: likely home when medically ready for discharge     BMI  Estimated body mass index is 39.99 kg/m as calculated from the following:   Height as of this encounter: 5\' 4"  (1.626 m).   Weight as of this encounter: 105.7 kg.  Scheduled medications:  . aspirin  81 mg Oral Daily  . dexamethasone (DECADRON) injection  6 mg Intravenous Q24H  . enoxaparin (LOVENOX) injection  0.5 mg/kg Subcutaneous Q24H  . guaiFENesin-dextromethorphan  15 mL Oral TID  . insulin pump   Subcutaneous See admin instructions  . levothyroxine  125 mcg Oral q morning - 10a  . magnesium oxide  400 mg Oral q morning - 10a  . metoprolol tartrate  12.5 mg Oral q morning - 10a  . mometasone-formoterol  2 puff Inhalation BID  . pantoprazole  40 mg Oral Daily  . rosuvastatin  10 mg Oral Daily  . sertraline  25 mg Oral Daily  . vitamin C  500 mg Oral Daily  . zinc sulfate  220 mg Oral Daily    Consultants:    Procedures:     Antibiotics:   Anti-infectives (From admission, onward)   Start     Dose/Rate Route Frequency Ordered Stop   04/18/19 1200  remdesivir 100 mg in sodium chloride 0.9 % 250 mL IVPB     100 mg 500 mL/hr over 30 Minutes Intravenous Every 24 hours 04/18/19 1101 04/22/19 1159   04/16/19 1630  remdesivir 200 mg in sodium chloride 0.9 % 250 mL IVPB     200 mg 500 mL/hr over 30 Minutes Intravenous Once 04/16/19 1626 04/16/19 1800       Objective   Vitals:   04/17/19 1554 04/17/19 2314 04/18/19 0746 04/18/19 1240   BP: (!) 142/74 107/62 (!) 134/55   Pulse: 82 73 72   Resp: 20 20 15    Temp: 97.6 F (36.4 C) 97.8 F (36.6 C) 98 F (36.7 C)   TempSrc:  Oral    SpO2: 96% 96% 95% 96%  Weight:      Height:        Intake/Output Summary (Last 24 hours) at 04/18/2019 1441 Last data filed at 04/18/2019 1320 Gross per 24 hour  Intake 980 ml  Output 1100 ml  Net -120 ml   Filed Weights   04/16/19 1032  Weight: 105.7 kg     Physical Examination:    General: Appears in no acute distress  Cardiovascular: S1-S2, regular, no murmur auscultated  Respiratory: Clear to auscultation bilaterally  Abdomen: Abdomen is soft, nontender, no organomegaly  Extremities: No edema in the lower extremities  Neurologic: Alert, oriented x3, no focal deficit noted     Data Reviewed: I have personally reviewed following labs and imaging studies   Recent Results (from the past 240 hour(s))  SARS CORONAVIRUS 2 (TAT 6-24 HRS) Nasopharyngeal Nasopharyngeal Swab     Status: Abnormal   Collection Time: 04/16/19 10:41 AM   Specimen: Nasopharyngeal Swab  Result Value Ref Range Status   SARS Coronavirus 2 POSITIVE (A) NEGATIVE Final    Comment: RESULT CALLED TO, READ BACK BY AND VERIFIED WITH: B MYRICK,RN 2010 04/16/2019 D BRADLEY (NOTE) SARS-CoV-2 target nucleic acids are DETECTED. The SARS-CoV-2 RNA is generally detectable in upper and lower respiratory specimens during the acute phase of infection. Positive results are indicative of the presence of SARS-CoV-2 RNA. Clinical correlation with patient history and other diagnostic information is  necessary to determine patient infection status. Positive results do not rule out bacterial infection or co-infection with other viruses.  The expected result is Negative. Fact Sheet for Patients: 04/18/2019 Fact Sheet for Healthcare Providers: HairSlick.no This test is not yet approved or cleared by  the [place] FDA and  has been authorized for detection and/or diagnosis of SARS-CoV-2 by FDA under an Emergency Use Authorization (EUA). This EUA will remain  in effect (meaning this test can be used) for t he duration of the COVID-19 declaration under Section 564(b)(1) of the Act, 21 U.S.C. section 360bbb-3(b)(1), unless the authorization is terminated or revoked sooner. Performed at [facility], [address], [place], [place]   Blood Culture (routine x 2)     Status: None (Preliminary result)   Collection Time: 04/16/19 11:35 AM   Specimen: BLOOD RIGHT WRIST  Result Value Ref Range Status   Specimen Description BLOOD RIGHT WRIST  Final   Special Requests  Final    BOTTLES DRAWN AEROBIC AND ANAEROBIC Blood Culture adequate volume   Culture   Final    NO GROWTH 2 DAYS Performed at [facility], [address] [place], [place] [address]    Report Status PENDING  Incomplete  Blood Culture (routine x 2)     Status: None (Preliminary result)   Collection Time: 04/16/19 11:50 AM   Specimen: BLOOD RIGHT FOREARM  Result Value Ref Range Status   Specimen Description BLOOD RIGHT FOREARM  Final   Special Requests   Final    BOTTLES DRAWN AEROBIC AND ANAEROBIC Blood Culture adequate volume   Culture   Final    NO GROWTH 2 DAYS Performed at [facility], [address] [place], [place] [address]    Report Status PENDING  Incomplete     Liver Function Tests: Recent Labs  Lab 04/16/19 1143 04/17/19 0317 04/18/19 0456  AST 37 29 30  ALT 27 25 22   ALKPHOS 174* 169* 161*  BILITOT 0.6 0.7 0.6  PROT 7.4 7.4 6.9  ALBUMIN 3.6 3.5 3.3*        Admission status: Inpatient: Based on patients clinical presentation and evaluation of above clinical data, I have made determination that patient meets Inpatient criteria at this time.   Meredeth IdeGagan S Emelda Kohlbeck   [facility] Pager [phone]. If 7PM-7AM, please contact night-coverage at [email or web address], Office   [phone]  password [email or web address]  04/18/2019, 2:41 PM  LOS: 2 days

## 2019-04-19 LAB — CBC WITH DIFFERENTIAL/PLATELET
Abs Immature Granulocytes: 0.17 10*3/uL — ABNORMAL HIGH (ref 0.00–0.07)
Basophils Absolute: 0 10*3/uL (ref 0.0–0.1)
Basophils Relative: 0 %
Eosinophils Absolute: 0 10*3/uL (ref 0.0–0.5)
Eosinophils Relative: 0 %
HCT: 40.2 % (ref 36.0–46.0)
Hemoglobin: 13.2 g/dL (ref 12.0–15.0)
Immature Granulocytes: 1 %
Lymphocytes Relative: 18 %
Lymphs Abs: 2.2 10*3/uL (ref 0.7–4.0)
MCH: 27.8 pg (ref 26.0–34.0)
MCHC: 32.8 g/dL (ref 30.0–36.0)
MCV: 84.6 fL (ref 80.0–100.0)
Monocytes Absolute: 0.6 10*3/uL (ref 0.1–1.0)
Monocytes Relative: 5 %
Neutro Abs: 9.1 10*3/uL — ABNORMAL HIGH (ref 1.7–7.7)
Neutrophils Relative %: 76 %
Platelets: 382 10*3/uL (ref 150–400)
RBC: 4.75 MIL/uL (ref 3.87–5.11)
RDW: 14.4 % (ref 11.5–15.5)
WBC: 12.1 10*3/uL — ABNORMAL HIGH (ref 4.0–10.5)
nRBC: 0 % (ref 0.0–0.2)

## 2019-04-19 LAB — COMPREHENSIVE METABOLIC PANEL
ALT: 24 U/L (ref 0–44)
AST: 39 U/L (ref 15–41)
Albumin: 3.2 g/dL — ABNORMAL LOW (ref 3.5–5.0)
Alkaline Phosphatase: 142 U/L — ABNORMAL HIGH (ref 38–126)
Anion gap: 12 (ref 5–15)
BUN: 13 mg/dL (ref 6–20)
CO2: 22 mmol/L (ref 22–32)
Calcium: 9.6 mg/dL (ref 8.9–10.3)
Chloride: 105 mmol/L (ref 98–111)
Creatinine, Ser: 0.82 mg/dL (ref 0.44–1.00)
GFR calc Af Amer: >60 mL/min (ref >60)
GFR calc non Af Amer: >60 mL/min (ref >60)
Glucose, Bld: 170 mg/dL — ABNORMAL HIGH (ref 70–99)
Potassium: 3.9 mmol/L (ref 3.5–5.1)
Sodium: 139 mmol/L (ref 135–145)
Total Bilirubin: 0.5 mg/dL (ref 0.3–1.2)
Total Protein: 6.8 g/dL (ref 6.5–8.1)

## 2019-04-19 LAB — D-DIMER, QUANTITATIVE: D-Dimer, Quant: 0.31 ug/mL-FEU (ref 0.00–0.50)

## 2019-04-19 LAB — C-REACTIVE PROTEIN: CRP: 1.9 mg/dL — ABNORMAL HIGH (ref <1.0)

## 2019-04-19 LAB — GLUCOSE, CAPILLARY
Glucose-Capillary: 156 mg/dL — ABNORMAL HIGH (ref 70–99)
Glucose-Capillary: 136 mg/dL — ABNORMAL HIGH (ref 70–99)
Glucose-Capillary: 84 mg/dL (ref 70–99)
Glucose-Capillary: 360 mg/dL — ABNORMAL HIGH (ref 70–99)

## 2019-04-19 LAB — FERRITIN: Ferritin: 116 ng/mL (ref 11–307)

## 2019-04-19 NOTE — Progress Notes (Signed)
[facility]  PROGRESS NOTE  [name] [id number]:[id number] DOB: 03-26-1966 DOA: 04/16/2019 PCP: Frederich Chick., MD   Brief HPI:   53 year old female with a history of type 1 diabetes mellitus, hypertension, hypothyroidism, hyperlipidemia, CAD, anxiety came with some general history of shortness of breath, nonproductive cough, myalgia, arthralgia, decreased p.o. intake.  She was tested positive for Covid on 04/09/2019.  She has been on quarantine at home since that time.  She came with shortness of breath, O2 sats were 89 to 91% on room air.  She was placed on supplemental oxygen saturation 96- 98% on 2 L.  Chest x-ray showed bibasilar hazy opacities.Raised inflammatory markers, CRP 10.4, ferritin 100, LDH 163, fibrinogen 634. Started on remdesivir and dexamethasone    Subjective   Patient seen and examined, breathing is improved.  Currently not requiring oxygen.   Assessment/Plan:     1. Acute hypoxic respiratory failure-secondary to COVID-19 pneumonia, started on remdesivir and Decadron.  Chest x-ray showed bibasilar hazy opacities.  CRP is down to 1.9.  Ferritin 116, D-dimer 0.31  Continue zinc and vitamin C. Patient will be discharged after completing remdesivir.  2. Diabetes mellitus type 1-patient is currently on insulin pump.  Continue to use insulin pump, CBG before every meal and at bedtime.  3. Hypertension-blood pressure is stable, continue metoprolol.  4. Hypothyroidism-continue Synthroid.  5. Hyperlipidemia-continue statin   CBG: Recent Labs  Lab 04/18/19 1130 04/18/19 1709 04/18/19 2113 04/19/19 0827 04/19/19 1153  GLUCAP 182* 160* 219* 156* 136*    CBC: Recent Labs  Lab 04/16/19 1143 04/17/19 0317 04/18/19 0456 04/19/19 0506  WBC 10.0 8.4 12.2* 12.1*  NEUTROABS 7.9* 6.8 9.7* 9.1*  HGB 13.0 13.5 12.6 13.2  HCT 40.8 42.9 38.5 40.2  MCV 86.4 87.4 84.1 84.6  PLT 237 272 312 382    Basic Metabolic Panel: Recent Labs  Lab 04/16/19 1143  04/17/19 0317 04/18/19 0456 04/19/19 0506  NA 134* 134* 135 139  K 3.9 4.3 4.0 3.9  CL 100 103 99 105  CO2 22 21* 22 22  GLUCOSE 275* 311* 243* 170*  BUN 10 11 12 13   CREATININE 0.72 0.68 0.74 0.82  CALCIUM 9.2 9.1 9.5 9.6  MG  --   --  1.9  --     COVID-19 Labs  Recent Labs    04/17/19 0317 04/18/19 0456 04/19/19 0506  DDIMER 0.46 0.46 0.31  FERRITIN 100 125 116  CRP 10.4* 4.6* 1.9*    Lab Results  Component Value Date   SARSCOV2NAA POSITIVE (A) 04/16/2019     DVT prophylaxis: Lovenox  Code Status: Full code  Family Communication: No family at bedside  Disposition Plan: likely home when medically ready for discharge     BMI  Estimated body mass index is 39.99 kg/m as calculated from the following:   Height as of this encounter: 5\' 4"  (1.626 m).   Weight as of this encounter: 105.7 kg.  Scheduled medications:  . aspirin  81 mg Oral Daily  . dexamethasone (DECADRON) injection  6 mg Intravenous Q24H  . enoxaparin (LOVENOX) injection  0.5 mg/kg Subcutaneous Q24H  . guaiFENesin-dextromethorphan  15 mL Oral TID  . insulin pump   Subcutaneous See admin instructions  . levothyroxine  125 mcg Oral q morning - 10a  . magnesium oxide  400 mg Oral q morning - 10a  . metoprolol tartrate  12.5 mg Oral q morning - 10a  . mometasone-formoterol  2 puff Inhalation BID  .  pantoprazole  40 mg Oral Daily  . rosuvastatin  10 mg Oral Daily  . sertraline  25 mg Oral Daily  . vitamin C  500 mg Oral Daily  . zinc sulfate  220 mg Oral Daily    Consultants:    Procedures:     Antibiotics:   Anti-infectives (From admission, onward)   Start     Dose/Rate Route Frequency Ordered Stop   04/18/19 1200  remdesivir 100 mg in sodium chloride 0.9 % 250 mL IVPB     100 mg 500 mL/hr over 30 Minutes Intravenous Every 24 hours 04/18/19 1101 04/22/19 1159   04/16/19 1630  remdesivir 200 mg in sodium chloride 0.9 % 250 mL IVPB     200 mg 500 mL/hr over 30 Minutes  Intravenous Once 04/16/19 1626 04/16/19 1800       Objective   Vitals:   04/18/19 1240 04/18/19 1711 04/18/19 2032 04/19/19 0825  BP:  135/69 (!) 150/73 135/76  Pulse:  65 61 65  Resp:   18   Temp:  97.9 F (36.6 C) 98.2 F (36.8 C) 98 F (36.7 C)  TempSrc:  Oral Oral Oral  SpO2: 96% 96% 97% 96%  Weight:      Height:        Intake/Output Summary (Last 24 hours) at 04/19/2019 1315 Last data filed at 04/18/2019 1500 Gross per 24 hour  Intake 510 ml  Output -  Net 510 ml   Filed Weights   04/16/19 1032  Weight: 105.7 kg     Physical Examination:  General-appears in no acute distress Heart-S1-S2, regular, no murmur auscultated Lungs-clear to auscultation bilaterally, no wheezing or crackles auscultated Abdomen-soft, nontender, no organomegaly Extremities-no edema in the lower extremities Neuro-alert, oriented x3, no focal deficit noted    Data Reviewed: I have personally reviewed following labs and imaging studies   Recent Results (from the past 240 hour(s))  SARS CORONAVIRUS 2 (TAT 6-24 HRS) Nasopharyngeal Nasopharyngeal Swab     Status: Abnormal   Collection Time: 04/16/19 10:41 AM   Specimen: Nasopharyngeal Swab  Result Value Ref Range Status   SARS Coronavirus 2 POSITIVE (A) NEGATIVE Final    Comment: RESULT CALLED TO, READ BACK BY AND VERIFIED WITH: B MYRICK,RN 2010 04/16/2019 D BRADLEY (NOTE) SARS-CoV-2 target nucleic acids are DETECTED. The SARS-CoV-2 RNA is generally detectable in upper and lower respiratory specimens during the acute phase of infection. Positive results are indicative of the presence of SARS-CoV-2 RNA. Clinical correlation with patient history and other diagnostic information is  necessary to determine patient infection status. Positive results do not rule out bacterial infection or co-infection with other viruses.  The expected result is Negative. Fact Sheet for Patients: HairSlick.nohttps://www.fda.gov/media/138098/download Fact Sheet  for Healthcare Providers: quierodirigir.comhttps://www.fda.gov/media/138095/download This test is not yet approved or cleared by the [place] FDA and  has been authorized for detection and/or diagnosis of SARS-CoV-2 by FDA under an Emergency Use Authorization (EUA). This EUA will remain  in effect (meaning this test can be used) for t he duration of the COVID-19 declaration under Section 564(b)(1) of the Act, 21 U.S.C. section 360bbb-3(b)(1), unless the authorization is terminated or revoked sooner. Performed at [facility], [address] [place], [place] [address]   Blood Culture (routine x 2)     Status: None (Preliminary result)   Collection Time: 04/16/19 11:35 AM   Specimen: BLOOD RIGHT WRIST  Result Value Ref Range Status   Specimen Description BLOOD RIGHT WRIST  Final  Special Requests   Final    BOTTLES DRAWN AEROBIC AND ANAEROBIC Blood Culture adequate volume   Culture   Final    NO GROWTH 3 DAYS Performed at [facility], [address] [place], [place] [address]    Report Status PENDING  Incomplete  Blood Culture (routine x 2)     Status: None (Preliminary result)   Collection Time: 04/16/19 11:50 AM   Specimen: BLOOD RIGHT FOREARM  Result Value Ref Range Status   Specimen Description BLOOD RIGHT FOREARM  Final   Special Requests   Final    BOTTLES DRAWN AEROBIC AND ANAEROBIC Blood Culture adequate volume   Culture   Final    NO GROWTH 3 DAYS Performed at [facility], [address], [place], [place] [address]    Report Status PENDING  Incomplete     Liver Function Tests: Recent Labs  Lab 04/16/19 1143 04/17/19 0317 04/18/19 0456 04/19/19 0506  AST 37 29 30 39  ALT 27 25 22 24   ALKPHOS 174* 169* 161* 142*  BILITOT 0.6 0.7 0.6 0.5  PROT 7.4 7.4 6.9 6.8  ALBUMIN 3.6 3.5 3.3* 3.2*        Admission status: Inpatient: Based on patients clinical presentation and evaluation of above clinical data, I have made determination that patient meets Inpatient  criteria at this time.   [facility] Pager [phone]. If 7PM-7AM, please contact night-coverage at [email or web address], Office  [phone]  password [place]  04/19/2019, 1:15 PM  LOS: 3 days

## 2019-04-20 LAB — COMPREHENSIVE METABOLIC PANEL
ALT: 26 U/L (ref 0–44)
AST: 33 U/L (ref 15–41)
Albumin: 3 g/dL — ABNORMAL LOW (ref 3.5–5.0)
Alkaline Phosphatase: 145 U/L — ABNORMAL HIGH (ref 38–126)
Anion gap: 12 (ref 5–15)
BUN: 14 mg/dL (ref 6–20)
CO2: 23 mmol/L (ref 22–32)
Calcium: 9.3 mg/dL (ref 8.9–10.3)
Chloride: 100 mmol/L (ref 98–111)
Creatinine, Ser: 0.9 mg/dL (ref 0.44–1.00)
GFR calc Af Amer: >60 mL/min (ref >60)
GFR calc non Af Amer: >60 mL/min (ref >60)
Glucose, Bld: 340 mg/dL — ABNORMAL HIGH (ref 70–99)
Potassium: 4 mmol/L (ref 3.5–5.1)
Sodium: 135 mmol/L (ref 135–145)
Total Bilirubin: 0.6 mg/dL (ref 0.3–1.2)
Total Protein: 6.3 g/dL — ABNORMAL LOW (ref 6.5–8.1)

## 2019-04-20 LAB — CBC WITH DIFFERENTIAL/PLATELET
Abs Immature Granulocytes: 0.18 10*3/uL — ABNORMAL HIGH (ref 0.00–0.07)
Basophils Absolute: 0 10*3/uL (ref 0.0–0.1)
Basophils Relative: 0 %
Eosinophils Absolute: 0 10*3/uL (ref 0.0–0.5)
Eosinophils Relative: 0 %
HCT: 38.6 % (ref 36.0–46.0)
Hemoglobin: 12.8 g/dL (ref 12.0–15.0)
Immature Granulocytes: 2 %
Lymphocytes Relative: 25 %
Lymphs Abs: 2.6 10*3/uL (ref 0.7–4.0)
MCH: 27.9 pg (ref 26.0–34.0)
MCHC: 33.2 g/dL (ref 30.0–36.0)
MCV: 84.1 fL (ref 80.0–100.0)
Monocytes Absolute: 0.7 10*3/uL (ref 0.1–1.0)
Monocytes Relative: 7 %
Neutro Abs: 6.7 10*3/uL (ref 1.7–7.7)
Neutrophils Relative %: 66 %
Platelets: 352 10*3/uL (ref 150–400)
RBC: 4.59 MIL/uL (ref 3.87–5.11)
RDW: 14.4 % (ref 11.5–15.5)
WBC: 10.2 10*3/uL (ref 4.0–10.5)
nRBC: 0 % (ref 0.0–0.2)

## 2019-04-20 LAB — FERRITIN: Ferritin: 98 ng/mL (ref 11–307)

## 2019-04-20 LAB — D-DIMER, QUANTITATIVE: D-Dimer, Quant: 0.28 ug{FEU}/mL (ref 0.00–0.50)

## 2019-04-20 LAB — GLUCOSE, CAPILLARY: Glucose-Capillary: 326 mg/dL — ABNORMAL HIGH (ref 70–99)

## 2019-04-20 LAB — C-REACTIVE PROTEIN: CRP: 1.1 mg/dL — ABNORMAL HIGH (ref <1.0)

## 2019-04-20 MED ORDER — GUAIFENESIN ER 600 MG PO TB12: 600.0000 mg | ORAL_TABLET | Freq: Two times a day (BID) | ORAL | Status: DC

## 2019-04-20 MED ORDER — GUAIFENESIN-DM 100-10 MG/5ML PO SYRP
15.0000 mL | ORAL_SOLUTION | Freq: Three times a day (TID) | ORAL | Status: DC
  Administered 2019-04-20 – 2019-04-21 (×3): 15 mL via ORAL
  Filled 2019-04-20 (×3): qty 15

## 2019-04-20 NOTE — Progress Notes (Signed)
[facility]  PROGRESS NOTE  [name] JKK:[id number] DOB: 01/06/1966 DOA: 04/16/2019 PCP: Sherald Hess., MD   Brief HPI:   53 year old female with a history of type 1 diabetes mellitus, hypertension, hypothyroidism, hyperlipidemia, CAD, anxiety came with some general history of shortness of breath, nonproductive cough, myalgia, arthralgia, decreased p.o. intake.  She was tested positive for Covid on 04/09/2019.  She has been on quarantine at home since that time.  She came with shortness of breath, O2 sats were 89 to 91% on room air.  She was placed on supplemental oxygen saturation 96- 98% on 2 L.  Chest x-ray showed bibasilar hazy opacities.Raised inflammatory markers, CRP 10.4, ferritin 100, LDH 163, fibrinogen 634. Started on remdesivir and dexamethasone    Subjective   Patient seen and examined, complains of coughing up yellow phlegm.  O2 sats 93% on room air.   Assessment/Plan:     1. Acute hypoxic respiratory failure-secondary to COVID-19 pneumonia, started on remdesivir and Decadron.  Chest x-ray showed bibasilar hazy opacities.  CRP is down to 1.1.  Ferritin 98, D-dimer 0. 28. continue zinc and vitamin C. Patient will be discharged after completing remdesivir.  2. Diabetes mellitus type 1-patient is currently on insulin pump.  Continue to use insulin pump, CBG before every meal and at bedtime.  3. Hypertension-blood pressure is stable, continue metoprolol.  4. Hypothyroidism-continue Synthroid.  5. Hyperlipidemia-continue statin   CBG: Recent Labs  Lab 04/19/19 0827 04/19/19 1153 04/19/19 1629 04/19/19 2102 04/20/19 0756  GLUCAP 156* 136* 84 360* 326*    CBC: Recent Labs  Lab 04/16/19 1143 04/17/19 0317 04/18/19 0456 04/19/19 0506 04/20/19 0737  WBC 10.0 8.4 12.2* 12.1* 10.2  NEUTROABS 7.9* 6.8 9.7* 9.1* 6.7  HGB 13.0 13.5 12.6 13.2 12.8  HCT 40.8 42.9 38.5 40.2 38.6  MCV 86.4 87.4 84.1 84.6 84.1  PLT 237 272 312 382 352    Basic  Metabolic Panel: Recent Labs  Lab 04/16/19 1143 04/17/19 0317 04/18/19 0456 04/19/19 0506 04/20/19 0737  NA 134* 134* 135 139 135  K 3.9 4.3 4.0 3.9 4.0  CL 100 103 99 105 100  CO2 22 21* 22 22 23   GLUCOSE 275* 311* 243* 170* 340*  BUN 10 11 12 13 14   CREATININE 0.72 0.68 0.74 0.82 0.90  CALCIUM 9.2 9.1 9.5 9.6 9.3  MG  --   --  1.9  --   --     COVID-19 Labs  Recent Labs    04/18/19 0456 04/19/19 0506 04/20/19 0737  DDIMER 0.46 0.31 0.28  FERRITIN 125 116 98  CRP 4.6* 1.9* 1.1*    Lab Results  Component Value Date   SARSCOV2NAA POSITIVE (A) 04/16/2019     DVT prophylaxis: Lovenox  Code Status: Full code  Family Communication: No family at bedside  Disposition Plan: likely home when medically ready for discharge     BMI  Estimated body mass index is 39.99 kg/m as calculated from the following:   Height as of this encounter: 5\' 4"  (1.626 m).   Weight as of this encounter: 105.7 kg.  Scheduled medications:  . aspirin  81 mg Oral Daily  . dexamethasone (DECADRON) injection  6 mg Intravenous Q24H  . enoxaparin (LOVENOX) injection  0.5 mg/kg Subcutaneous Q24H  . guaiFENesin-dextromethorphan  15 mL Oral TID  . insulin pump   Subcutaneous See admin instructions  . levothyroxine  125 mcg Oral q morning - 10a  . magnesium oxide  400 mg Oral  q morning - 10a  . metoprolol tartrate  12.5 mg Oral q morning - 10a  . mometasone-formoterol  2 puff Inhalation BID  . pantoprazole  40 mg Oral Daily  . rosuvastatin  10 mg Oral Daily  . sertraline  25 mg Oral Daily  . vitamin C  500 mg Oral Daily  . zinc sulfate  220 mg Oral Daily    Consultants:    Procedures:     Antibiotics:   Anti-infectives (From admission, onward)   Start     Dose/Rate Route Frequency Ordered Stop   04/18/19 1200  remdesivir 100 mg in sodium chloride 0.9 % 250 mL IVPB     100 mg 500 mL/hr over 30 Minutes Intravenous Every 24 hours 04/18/19 1101 04/22/19 1159   04/16/19 1630   remdesivir 200 mg in sodium chloride 0.9 % 250 mL IVPB     200 mg 500 mL/hr over 30 Minutes Intravenous Once 04/16/19 1626 04/16/19 1800       Objective   Vitals:   04/19/19 0825 04/19/19 1630 04/19/19 2239 04/20/19 0800  BP: 135/76 (!) 142/68 (!) 120/57 (!) 141/65  Pulse: 65 61 79 68  Resp:   18   Temp: 98 F (36.7 C) 98 F (36.7 C) 98.4 F (36.9 C) 97.6 F (36.4 C)  TempSrc: Oral  Oral Oral  SpO2: 96% 94% 93% 93%  Weight:      Height:        Intake/Output Summary (Last 24 hours) at 04/20/2019 1443 Last data filed at 04/20/2019 0945 Gross per 24 hour  Intake 240 ml  Output -  Net 240 ml   Filed Weights   04/16/19 1032  Weight: 105.7 kg     Physical Examination:  General-appears in no acute distress Heart-S1-S2, regular, no murmur auscultated Lungs-clear to auscultation bilaterally, no wheezing or crackles auscultated Abdomen-soft, nontender, no organomegaly Extremities-no edema in the lower extremities Neuro-alert, oriented x3, no focal deficit noted   Data Reviewed: I have personally reviewed following labs and imaging studies   Recent Results (from the past 240 hour(s))  SARS CORONAVIRUS 2 (TAT 6-24 HRS) Nasopharyngeal Nasopharyngeal Swab     Status: Abnormal   Collection Time: 04/16/19 10:41 AM   Specimen: Nasopharyngeal Swab  Result Value Ref Range Status   SARS Coronavirus 2 POSITIVE (A) NEGATIVE Final    Comment: RESULT CALLED TO, READ BACK BY AND VERIFIED WITH: B MYRICK,RN 2010 04/16/2019 D BRADLEY (NOTE) SARS-CoV-2 target nucleic acids are DETECTED. The SARS-CoV-2 RNA is generally detectable in upper and lower respiratory specimens during the acute phase of infection. Positive results are indicative of the presence of SARS-CoV-2 RNA. Clinical correlation with patient history and other diagnostic information is  necessary to determine patient infection status. Positive results do not rule out bacterial infection or co-infection with other  viruses.  The expected result is Negative. Fact Sheet for Patients: HairSlick.no Fact Sheet for Healthcare Providers: quierodirigir.com This test is not yet approved or cleared by the [place] FDA and  has been authorized for detection and/or diagnosis of SARS-CoV-2 by FDA under an Emergency Use Authorization (EUA). This EUA will remain  in effect (meaning this test can be used) for t he duration of the COVID-19 declaration under Section 564(b)(1) of the Act, 21 U.S.C. section 360bbb-3(b)(1), unless the authorization is terminated or revoked sooner. Performed at [facility], [address], [place], [place] [address]   Blood Culture (routine x 2)     Status: None (  Preliminary result)   Collection Time: 04/16/19 11:35 AM   Specimen: BLOOD RIGHT WRIST  Result Value Ref Range Status   Specimen Description BLOOD RIGHT WRIST  Final   Special Requests   Final    BOTTLES DRAWN AEROBIC AND ANAEROBIC Blood Culture adequate volume   Culture   Final    NO GROWTH 3 DAYS Performed at [facility], [address] [place], [place] [address]    Report Status PENDING  Incomplete  Blood Culture (routine x 2)     Status: None (Preliminary result)   Collection Time: 04/16/19 11:50 AM   Specimen: BLOOD RIGHT FOREARM  Result Value Ref Range Status   Specimen Description BLOOD RIGHT FOREARM  Final   Special Requests   Final    BOTTLES DRAWN AEROBIC AND ANAEROBIC Blood Culture adequate volume   Culture   Final    NO GROWTH 3 DAYS Performed at [facility], [address] [place], [place] [address]    Report Status PENDING  Incomplete     Liver Function Tests: Recent Labs  Lab 04/16/19 1143 04/17/19 0317 04/18/19 0456 04/19/19 0506 04/20/19 0737  AST 37 29 30 39 33  ALT 27 25 22 24 26   ALKPHOS 174* 169* 161* 142* 145*  BILITOT 0.6 0.7 0.6 0.5 0.6  PROT 7.4 7.4 6.9 6.8 6.3*  ALBUMIN 3.6 3.5 3.3* 3.2* 3.0*         Admission status: Inpatient: Based on patients clinical presentation and evaluation of above clinical data, I have made determination that patient meets Inpatient criteria at this time.   Meredeth IdeGagan S Schneur Crowson   [facility] Pager [phone]. If 7PM-7AM, please contact night-coverage at [email or web address], Office  [phone]  password [email or web address]  04/20/2019, 2:43 PM  LOS: 4 days

## 2019-04-21 LAB — CULTURE, BLOOD (ROUTINE X 2)
Culture: NO GROWTH
Culture: NO GROWTH
Special Requests: ADEQUATE
Special Requests: ADEQUATE

## 2019-04-21 LAB — CBC WITH DIFFERENTIAL/PLATELET
Abs Immature Granulocytes: 0.25 10*3/uL — ABNORMAL HIGH (ref 0.00–0.07)
Basophils Absolute: 0 10*3/uL (ref 0.0–0.1)
Basophils Relative: 0 %
Eosinophils Absolute: 0 10*3/uL (ref 0.0–0.5)
Eosinophils Relative: 0 %
HCT: 38.2 % (ref 36.0–46.0)
Hemoglobin: 12.5 g/dL (ref 12.0–15.0)
Immature Granulocytes: 3 %
Lymphocytes Relative: 19 %
Lymphs Abs: 1.6 10*3/uL (ref 0.7–4.0)
MCH: 27.8 pg (ref 26.0–34.0)
MCHC: 32.7 g/dL (ref 30.0–36.0)
MCV: 84.9 fL (ref 80.0–100.0)
Monocytes Absolute: 0.6 10*3/uL (ref 0.1–1.0)
Monocytes Relative: 6 %
Neutro Abs: 6.1 10*3/uL (ref 1.7–7.7)
Neutrophils Relative %: 72 %
Platelets: 324 10*3/uL (ref 150–400)
RBC: 4.5 MIL/uL (ref 3.87–5.11)
RDW: 14.5 % (ref 11.5–15.5)
WBC: 8.5 10*3/uL (ref 4.0–10.5)
nRBC: 0 % (ref 0.0–0.2)

## 2019-04-21 LAB — COMPREHENSIVE METABOLIC PANEL
ALT: 24 U/L (ref 0–44)
AST: 26 U/L (ref 15–41)
Albumin: 3 g/dL — ABNORMAL LOW (ref 3.5–5.0)
Alkaline Phosphatase: 131 U/L — ABNORMAL HIGH (ref 38–126)
Anion gap: 12 (ref 5–15)
BUN: 14 mg/dL (ref 6–20)
CO2: 25 mmol/L (ref 22–32)
Calcium: 9.3 mg/dL (ref 8.9–10.3)
Chloride: 99 mmol/L (ref 98–111)
Creatinine, Ser: 0.77 mg/dL (ref 0.44–1.00)
GFR calc Af Amer: >60 mL/min (ref >60)
GFR calc non Af Amer: >60 mL/min (ref >60)
Glucose, Bld: 275 mg/dL — ABNORMAL HIGH (ref 70–99)
Potassium: 4.4 mmol/L (ref 3.5–5.1)
Sodium: 136 mmol/L (ref 135–145)
Total Bilirubin: 0.5 mg/dL (ref 0.3–1.2)
Total Protein: 6.2 g/dL — ABNORMAL LOW (ref 6.5–8.1)

## 2019-04-21 LAB — D-DIMER, QUANTITATIVE: D-Dimer, Quant: 0.34 ug/mL-FEU (ref 0.00–0.50)

## 2019-04-21 LAB — C-REACTIVE PROTEIN: CRP: 1.4 mg/dL — ABNORMAL HIGH (ref <1.0)

## 2019-04-21 LAB — FERRITIN: Ferritin: 84 ng/mL (ref 11–307)

## 2019-04-21 LAB — GLUCOSE, CAPILLARY
Glucose-Capillary: 165 mg/dL — ABNORMAL HIGH (ref 70–99)
Glucose-Capillary: 129 mg/dL — ABNORMAL HIGH (ref 70–99)
Glucose-Capillary: 290 mg/dL — ABNORMAL HIGH (ref 70–99)
Glucose-Capillary: 238 mg/dL — ABNORMAL HIGH (ref 70–99)
Glucose-Capillary: 191 mg/dL — ABNORMAL HIGH (ref 70–99)

## 2019-04-21 MED ORDER — ALBUTEROL SULFATE HFA 108 (90 BASE) MCG/ACT IN AERS: 2.0000 | INHALATION_SPRAY | Freq: Four times a day (QID) | RESPIRATORY_TRACT | 1 refills | Status: AC | PRN

## 2019-04-21 MED ORDER — DEXAMETHASONE 6 MG PO TABS: 6.0000 mg | ORAL_TABLET | Freq: Every day | ORAL | 0 refills | Status: DC

## 2019-04-21 MED ORDER — ZINC SULFATE 220 (50 ZN) MG PO CAPS: 220.0000 mg | ORAL_CAPSULE | Freq: Every day | ORAL | 0 refills | Status: DC

## 2019-04-21 NOTE — Progress Notes (Signed)
Pt ready for discharge but family unable to get pt until 2pm. Education given and understood, summary given and IVs removed.

## 2019-04-21 NOTE — Discharge Summary (Signed)
Physician Discharge Summary  [name]:[id number] DOB: 12/02/1965 DOA: 04/16/2019  PCP: Sherald Hess., MD  Admit date: 04/16/2019 Discharge date: 04/21/2019  Time spent: 50 minutes  Recommendations for Outpatient Follow-up:  1. Need to quarantine for 21 days   Discharge Diagnoses:  Active Problems:   Pneumonia due to COVID-19 virus   Acute respiratory failure with hypoxia ([place])   Uncontrolled type 1 diabetes mellitus with hypoglycemia without coma, with long-term current use of insulin ([place])   Discharge Condition: Stable  Diet recommendation: Heart healthy diet  Filed Weights   04/16/19 1032  Weight: 105.7 kg    History of present illness:  53 year old female with a history of type 1 diabetes mellitus, hypertension, hypothyroidism, hyperlipidemia, CAD, anxiety came with some general history of shortness of breath, nonproductive cough, myalgia, arthralgia, decreased p.o. intake. She was tested positive for Covid on 04/09/2019. She has been on quarantine at home since that time. She came with shortness of breath, O2 sats were 89 to 91% on room air. She was placed on supplemental oxygen saturation 96- 98% on 2 L. Chest x-ray showed bibasilar hazy opacities.Raised inflammatory markers, CRP 10.4, ferritin 100, LDH 163, fibrinogen 634.   Started on remdesivir and dexamethasone  Hospital Course:   1. Acute hypoxic respiratory failure-secondary to COVID-19 pneumonia, started on remdesivir and Decadron.  Chest x-ray showed bibasilar hazy opacities.  CRP is down to 1.1.  Ferritin 98, D-dimer 0. 28. continue zinc and vitamin C. Patient will be discharged home today, she completed 5 days treatment of remdesivir in the hospital.  Will discharge on Decadron 6 mg p.o. daily for 5 more days.  Continue albuterol inhaler as needed.  At this time patient is not requiring oxygen.  O2 sats 91 to 93% on room air.  2. Diabetes mellitus type 1-patient is currently on insulin  pump.  Continue to use insulin pump, CBG before every meal and at bedtime.  3. Hypertension-blood pressure is stable, continue metoprolol.  4. Hypothyroidism-continue Synthroid.  5. Hyperlipidemia-continue statin   Procedures:    Consultations:    Discharge Exam: Vitals:   04/20/19 2236 04/21/19 0755  BP: (!) 127/57 (!) 143/64  Pulse: 63 67  Resp: 18 14  Temp: 98.3 F (36.8 C) 98.4 F (36.9 C)  SpO2: 93% 91%    General: Appears in no acute distress Cardiovascular: S1-S2, regular Respiratory: Scattered wheezing bilaterally  Discharge Instructions   Discharge Instructions    Diet - low sodium heart healthy   Complete by: As directed    Increase activity slowly   Complete by: As directed      Allergies as of 04/21/2019      Reactions   Metformin Anaphylaxis   Penicillins Anaphylaxis, Other (See Comments)   Has patient had a PCN reaction causing immediate rash, facial/tongue/throat swelling, SOB or lightheadedness with hypotension: Yes Has patient had a PCN reaction causing severe rash involving mucus membranes or skin necrosis: No Has patient had a PCN reaction that required hospitalization No Has patient had a PCN reaction occurring within the last 10 years: No If all of the above answers are "NO", then may proceed with Cephalosporin use.   Metoclopramide Other (See Comments)   Reaction:  Nightmares       Medication List    STOP taking these medications   Breo Ellipta 100-25 MCG/INH Aepb Generic drug: fluticasone furoate-vilanterol   celecoxib 200 MG capsule Commonly known as: CELEBREX   doxycycline 100 MG capsule Commonly known as:  VIBRAMYCIN   zinc gluconate 50 MG tablet     TAKE these medications   albuterol 108 (90 Base) MCG/ACT inhaler Commonly known as: Ventolin HFA Inhale 2 puffs into the lungs every 6 (six) hours as needed for wheezing or shortness of breath. What changed: Another medication with the same name was removed. Continue  taking this medication, and follow the directions you see here.   ALPRAZolam 0.25 MG tablet Commonly known as: XANAX Take 0.25 mg by mouth 2 (two) times daily as needed for anxiety or sleep.   aspirin 81 MG chewable tablet Chew 81 mg by mouth daily.   chlorhexidine 0.12 % solution Commonly known as: PERIDEX Use as directed 15 mLs in the mouth or throat 2 (two) times daily.   dexamethasone 6 MG tablet Commonly known as: DECADRON Take 1 tablet (6 mg total) by mouth daily.   furosemide 40 MG tablet Commonly known as: LASIX TAKE 1 TABLET (40 MG TOTAL) BY MOUTH DAILY. What changed:   how much to take  how to take this  when to take this  reasons to take this  additional instructions   hyoscyamine 0.125 MG SL tablet Commonly known as: LEVSIN SL PLACE 1 TABLET (0.125 MG) UNDER THE TONGUE ONCE TO TWICE A DAY, AS NEEDED What changed: See the new instructions.   insulin pump Soln Inject into the skin See admin instructions. Pt uses up to 150 units of NOVOLOG daily VIA PUMP   lansoprazole 30 MG disintegrating tablet Commonly known as: Prevacid SoluTab Take 1 tablet (30 mg total) by mouth daily.   levothyroxine 125 MCG tablet Commonly known as: SYNTHROID Take 125 mcg by mouth every morning.   Magnesium Oxide 500 MG Caps Take 1 capsule by mouth every morning.   metFORMIN 500 MG tablet Commonly known as: GLUCOPHAGE Take 1,000 mg by mouth at bedtime.   metoCLOPramide 5 MG tablet Commonly known as: REGLAN TAKE 1 TABLET (5 MG TOTAL) BY MOUTH 3 (THREE) TIMES DAILY AS NEEDED FOR NAUSEA.   metoprolol tartrate 25 MG tablet Commonly known as: LOPRESSOR TAKE 1/2 TABLET BY MOUTH EVERY DAY What changed: when to take this   nitroGLYCERIN 0.4 MG SL tablet Commonly known as: NITROSTAT Place 1 tablet (0.4 mg total) under the tongue every 5 (five) minutes as needed for chest pain.   NovoLOG 100 UNIT/ML injection Generic drug: insulin aspart Inject 100-150 Units into the skin  as directed. Use up to 100-150 units daily in insulin pump as needed   NYQUIL PO Take 30 mLs by mouth at bedtime as needed (for cough).   oxyCODONE-acetaminophen 5-325 MG tablet Commonly known as: PERCOCET/ROXICET Take 1 tablet by mouth 3 (three) times daily.   potassium chloride 10 MEQ tablet Commonly known as: KLOR-CON Take 1 tablet (10 mEq total) by mouth daily.   rosuvastatin 5 MG tablet Commonly known as: CRESTOR Take 1 tablet (5 mg total) by mouth daily. What changed: how much to take   sertraline 25 MG tablet Commonly known as: ZOLOFT Take 25 mg by mouth daily.   Suprep Bowel Prep Kit 17.5-3.13-1.6 GM/177ML Soln Generic drug: Na Sulfate-K Sulfate-Mg Sulf Suprep-Use as directed   Symbicort 160-4.5 MCG/ACT inhaler Generic drug: budesonide-formoterol Inhale 2 puffs into the lungs 2 (two) times daily.   vitamin C 500 MG tablet Commonly known as: ASCORBIC ACID Take 500 mg by mouth daily.   Vitamin D (Ergocalciferol) 1.25 MG (50000 UT) Caps capsule Commonly known as: DRISDOL Take 50,000 Units by mouth every Monday.  zinc sulfate 220 (50 Zn) MG capsule Take 1 capsule (220 mg total) by mouth daily. Start taking on: April 22, 2019      Allergies  Allergen Reactions  . Metformin Anaphylaxis  . Penicillins Anaphylaxis and Other (See Comments)    Has patient had a PCN reaction causing immediate rash, facial/tongue/throat swelling, SOB or lightheadedness with hypotension: Yes Has patient had a PCN reaction causing severe rash involving mucus membranes or skin necrosis: No Has patient had a PCN reaction that required hospitalization No Has patient had a PCN reaction occurring within the last 10 years: No If all of the above answers are "NO", then may proceed with Cephalosporin use.  . Metoclopramide Other (See Comments)    Reaction:  Nightmares       The results of significant diagnostics from this hospitalization (including imaging, microbiology, ancillary and  laboratory) are listed below for reference.    Significant Diagnostic Studies: Dg Chest Port 1 View  Result Date: 04/16/2019 CLINICAL DATA:  COVID-19 positive, fatigue EXAM: PORTABLE CHEST 1 VIEW COMPARISON:  12/06/2017 FINDINGS: Post CABG changes. Stable cardiomediastinal contours. Hazy airspace opacity within the peripheral aspect of the left lower lobe. No pleural effusion. No pneumothorax. IMPRESSION: Hazy airspace opacity within the peripheral aspect of the left lower lobe suspicious for viral pneumonia given the patient's history. Electronically Signed   By: Davina Poke M.D.   On: 04/16/2019 11:36    Microbiology: Recent Results (from the past 240 hour(s))  SARS CORONAVIRUS 2 (TAT 6-24 HRS) Nasopharyngeal Nasopharyngeal Swab     Status: Abnormal   Collection Time: 04/16/19 10:41 AM   Specimen: Nasopharyngeal Swab  Result Value Ref Range Status   SARS Coronavirus 2 POSITIVE (A) NEGATIVE Final    Comment: RESULT CALLED TO, READ BACK BY AND VERIFIED WITH: B MYRICK,RN 2010 04/16/2019 D BRADLEY (NOTE) SARS-CoV-2 target nucleic acids are DETECTED. The SARS-CoV-2 RNA is generally detectable in upper and lower respiratory specimens during the acute phase of infection. Positive results are indicative of the presence of SARS-CoV-2 RNA. Clinical correlation with patient history and other diagnostic information is  necessary to determine patient infection status. Positive results do not rule out bacterial infection or co-infection with other viruses.  The expected result is Negative. Fact Sheet for Patients: SugarRoll.be Fact Sheet for Healthcare Providers: https://www.woods-mathews.com/ This test is not yet approved or cleared by the [place] FDA and  has been authorized for detection and/or diagnosis of SARS-CoV-2 by FDA under an Emergency Use Authorization (EUA). This EUA will remain  in effect (meaning this test can be used) for t he  duration of the COVID-19 declaration under Section 564(b)(1) of the Act, 21 U.S.C. section 360bbb-3(b)(1), unless the authorization is terminated or revoked sooner. Performed at [facility], [place] [address], [place], [place] [address]   Blood Culture (routine x 2)     Status: None   Collection Time: 04/16/19 11:35 AM   Specimen: BLOOD RIGHT WRIST  Result Value Ref Range Status   Specimen Description BLOOD RIGHT WRIST  Final   Special Requests   Final    BOTTLES DRAWN AEROBIC AND ANAEROBIC Blood Culture adequate volume   Culture   Final    NO GROWTH 5 DAYS Performed at [facility], [address], [place], [place] [address]    Report Status 04/21/2019 FINAL  Final  Blood Culture (routine x 2)     Status: None   Collection Time: 04/16/19 11:50 AM   Specimen: BLOOD RIGHT FOREARM  Result Value Ref Range Status   Specimen Description BLOOD RIGHT FOREARM  Final   Special Requests   Final    BOTTLES DRAWN AEROBIC AND ANAEROBIC Blood Culture adequate volume   Culture   Final    NO GROWTH 5 DAYS Performed at [facility], [address] [place], [place] [address]    Report Status 04/21/2019 FINAL  Final     Labs: Basic Metabolic Panel: Recent Labs  Lab 04/17/19 0317 04/18/19 0456 04/19/19 0506 04/20/19 0737 04/21/19 0453  NA 134* 135 139 135 136  K 4.3 4.0 3.9 4.0 4.4  CL 103 99 105 100 99  CO2 21* _0 GLUCOSE 311* 243* 170* 340* 275*  BUN _1 CREATININE 0.68 0.74 0.82 0.90 0.77  CALCIUM 9.1 9.5 9.6 9.3 9.3  MG  --  1.9  --   --   --    Liver Function Tests: Recent Labs  Lab 04/17/19 0317 04/18/19 0456 04/19/19 0506 04/20/19 0737 04/21/19 0453  AST 29 30 39 33 26  ALT _2 ALKPHOS 169* 161* 142* 145* 131*  BILITOT 0.7 0.6 0.5 0.6 0.5  PROT 7.4 6.9 6.8 6.3* 6.2*  ALBUMIN 3.5 3.3* 3.2* 3.0* 3.0*   No results for input(s): LIPASE, AMYLASE in the last 168 hours. No results for input(s): AMMONIA in the last 168  hours. CBC: Recent Labs  Lab 04/17/19 0317 04/18/19 0456 04/19/19 0506 04/20/19 0737 04/21/19 0453  WBC 8.4 12.2* 12.1* 10.2 8.5  NEUTROABS 6.8 9.7* 9.1* 6.7 6.1  HGB 13.5 12.6 13.2 12.8 12.5  HCT 42.9 38.5 40.2 38.6 38.2  MCV 87.4 84.1 84.6 84.1 84.9  PLT 272 312 382 352 324    CBG: Recent Labs  Lab 04/19/19 1153 04/19/19 1629 04/19/19 2102 04/20/19 0756 04/21/19 0753  GLUCAP 136* 84 360* 326* 238*       Signed:  Oswald Hillock MD.  [facility] 04/21/2019, 10:04 AM

## 2019-04-21 NOTE — Discharge Instructions (Signed)
You need to quarantine at home for 21 days    Person Under Monitoring Name: [name]  Location: [address] [place] [id number]   Infection Prevention Recommendations for Individuals Confirmed to have, or Being Evaluated for, 2019 Novel Coronavirus (COVID-19) Infection Who Receive Care at Home  Individuals who are confirmed to have, or are being evaluated for, COVID-19 should follow the prevention steps below until a healthcare provider or local or state health department says they can return to normal activities.  Stay home except to get medical care You should restrict activities outside your home, except for getting medical care. Do not go to work, school, or public areas, and do not use public transportation or taxis.  Call ahead before visiting your doctor Before your medical appointment, call the healthcare provider and tell them that you have, or are being evaluated for, COVID-19 infection. This will help the healthcare providers office take steps to keep other people from getting infected. Ask your healthcare provider to call the local or state health department.  Monitor your symptoms Seek prompt medical attention if your illness is worsening (e.g., difficulty breathing). Before going to your medical appointment, call the healthcare provider and tell them that you have, or are being evaluated for, COVID-19 infection. Ask your healthcare provider to call the local or state health department.  Wear a facemask You should wear a facemask that covers your nose and mouth when you are in the same room with other people and when you visit a healthcare provider. People who live with or visit you should also wear a facemask while they are in the same room with you.  Separate yourself from other people in your home As much as possible, you should stay in a different room from other people in your home. Also, you should use a separate bathroom, if  available.  Avoid sharing household items You should not share dishes, drinking glasses, cups, eating utensils, towels, bedding, or other items with other people in your home. After using these items, you should wash them thoroughly with soap and water.  Cover your coughs and sneezes Cover your mouth and nose with a tissue when you cough or sneeze, or you can cough or sneeze into your sleeve. Throw used tissues in a lined trash can, and immediately wash your hands with soap and water for at least 20 seconds or use an alcohol-based hand rub.  Wash your [employer] Pacific Corporationhands Wash your hands often and thoroughly with soap and water for at least 20 seconds. You can use an alcohol-based hand sanitizer if soap and water are not available and if your hands are not visibly dirty. Avoid touching your eyes, nose, and mouth with unwashed hands.   Prevention Steps for Caregivers and Household Members of Individuals Confirmed to have, or Being Evaluated for, COVID-19 Infection Being Cared for in the Home  If you live with, or provide care at home for, a person confirmed to have, or being evaluated for, COVID-19 infection please follow these guidelines to prevent infection:  Follow healthcare providers instructions Make sure that you understand and can help the patient follow any healthcare provider instructions for all care.  Provide for the patients basic needs You should help the patient with basic needs in the home and provide support for getting groceries, prescriptions, and other personal needs.  Monitor the patients symptoms If they are getting sicker, call his or her medical provider and tell them that the patient has, or  is being evaluated for, COVID-19 infection. This will help the healthcare providers office take steps to keep other people from getting infected. Ask the healthcare provider to call the local or state health department.  Limit the number of people who have contact with the  patient  If possible, have only one caregiver for the patient.  Other household members should stay in another home or place of residence. If this is not possible, they should stay  in another room, or be separated from the patient as much as possible. Use a separate bathroom, if available.  Restrict visitors who do not have an essential need to be in the home.  Keep older adults, very young children, and other sick people away from the patient Keep older adults, very young children, and those who have compromised immune systems or chronic health conditions away from the patient. This includes people with chronic heart, lung, or kidney conditions, diabetes, and cancer.  Ensure good ventilation Make sure that shared spaces in the home have good air flow, such as from an air conditioner or an opened window, weather permitting.  Wash your hands often  Wash your hands often and thoroughly with soap and water for at least 20 seconds. You can use an alcohol based hand sanitizer if soap and water are not available and if your hands are not visibly dirty.  Avoid touching your eyes, nose, and mouth with unwashed hands.  Use disposable paper towels to dry your hands. If not available, use dedicated cloth towels and replace them when they become wet.  Wear a facemask and gloves  Wear a disposable facemask at all times in the room and gloves when you touch or have contact with the patients blood, body fluids, and/or secretions or excretions, such as sweat, saliva, sputum, nasal mucus, vomit, urine, or feces.  Ensure the mask fits over your nose and mouth tightly, and do not touch it during use.  Throw out disposable facemasks and gloves after using them. Do not reuse.  Wash your hands immediately after removing your facemask and gloves.  If your personal clothing becomes contaminated, carefully remove clothing and launder. Wash your hands after handling contaminated clothing.  Place all used  disposable facemasks, gloves, and other waste in a lined container before disposing them with other household waste.  Remove gloves and wash your hands immediately after handling these items.  Do not share dishes, glasses, or other household items with the patient  Avoid sharing household items. You should not share dishes, drinking glasses, cups, eating utensils, towels, bedding, or other items with a patient who is confirmed to have, or being evaluated for, COVID-19 infection.  After the person uses these items, you should wash them thoroughly with soap and water.  Wash laundry thoroughly  Immediately remove and wash clothes or bedding that have blood, body fluids, and/or secretions or excretions, such as sweat, saliva, sputum, nasal mucus, vomit, urine, or feces, on them.  Wear gloves when handling laundry from the patient.  Read and follow directions on labels of laundry or clothing items and detergent. In general, wash and dry with the warmest temperatures recommended on the label.  Clean all areas the individual has used often  Clean all touchable surfaces, such as counters, tabletops, doorknobs, bathroom fixtures, toilets, phones, keyboards, tablets, and bedside tables, every day. Also, clean any surfaces that may have blood, body fluids, and/or secretions or excretions on them.  Wear gloves when cleaning surfaces the patient has come in contact  with.  Use a diluted bleach solution (e.g., dilute bleach with 1 part bleach and 10 parts water) or a household disinfectant with a label that says EPA-registered for coronaviruses. To make a bleach solution at home, add 1 tablespoon of bleach to 1 quart (4 cups) of water. For a larger supply, add  cup of bleach to 1 gallon (16 cups) of water.  Read labels of cleaning products and follow recommendations provided on product labels. Labels contain instructions for safe and effective use of the cleaning product including precautions you should  take when applying the product, such as wearing gloves or eye protection and making sure you have good ventilation during use of the product.  Remove gloves and wash hands immediately after cleaning.  Monitor yourself for signs and symptoms of illness Caregivers and household members are considered close contacts, should monitor their health, and will be asked to limit movement outside of the home to the extent possible. Follow the monitoring steps for close contacts listed on the symptom monitoring form.   ? If you have additional questions, contact your local health department or call the epidemiologist on call at [phone] (available 24/7). ? This guidance is subject to change. For the most up-to-date guidance from [facility], please refer to their website: TripMetro.hu

## 2019-05-29 ENCOUNTER — Telehealth: Payer: Self-pay | Admitting: Cardiovascular Disease

## 2019-05-29 NOTE — Telephone Encounter (Signed)
Virtual Visit Pre-Appointment Phone Call  "(Name), I am calling you today to discuss your upcoming appointment. We are currently trying to limit exposure to the virus that causes COVID-19 by seeing patients at home rather than in the office."  "What is the BEST phone number to call the day of the visit?" - [phone]  1. Do you have or have access to (through a family member/friend) a smartphone with video capability that we can use for your visit?" a. If yes - list this number in appt notes as cell (if different from BEST phone #) and list the appointment type as a VIDEO visit in appointment notes b. If no - list the appointment type as a PHONE visit in appointment notes  2. Confirm consent - "In the setting of the current Covid19 crisis, you are scheduled for a (phone or video) visit with your provider on (date) at (time).  Just as we do with many in-office visits, in order for you to participate in this visit, we must obtain consent.  If you'd like, I can send this to your mychart (if signed up) or email for you to review.  Otherwise, I can obtain your verbal consent now.  All virtual visits are billed to your insurance company just like a normal visit would be.  By agreeing to a virtual visit, we'd like you to understand that the technology does not allow for your provider to perform an examination, and thus may limit your provider's ability to fully assess your condition. If your provider identifies any concerns that need to be evaluated in person, we will make arrangements to do so.  Finally, though the technology is pretty good, we cannot assure that it will always work on either your or our end, and in the setting of a video visit, we may have to convert it to a phone-only visit.  In either situation, we cannot ensure that we have a secure connection.  Are you willing to proceed?" STAFF: Did the patient verbally acknowledge consent to telehealth visit? Document YES/NO here: YES    3. Advise patient to be prepared - "Two hours prior to your appointment, go ahead and check your blood pressure, pulse, oxygen saturation, and your weight (if you have the equipment to check those) and write them all down. When your visit starts, your provider will ask you for this information. If you have an Apple Watch or Kardia device, please plan to have heart rate information ready on the day of your appointment. Please have a pen and paper handy nearby the day of the visit as well."  4. Give patient instructions for MyChart download to smartphone OR Doximity/Doxy.me as below if video visit (depending on what platform provider is using)  5. Inform patient they will receive a phone call 15 minutes prior to their appointment time (may be from unknown caller ID) so they should be prepared to answer    TELEPHONE CALL NOTE  Ovid Curd Holladay has been deemed a candidate for a follow-up tele-health visit to limit community exposure during the Covid-19 pandemic. I spoke with the patient via phone to ensure availability of phone/video source, confirm preferred email & phone number, and discuss instructions and expectations.  I reminded [name] to be prepared with any vital sign and/or heart rhythm information that could potentially be obtained via home monitoring, at the time of her visit. I reminded [name] to expect a phone call prior to her visit.  Vicky  T Slaughter 05/29/2019 4:34 PM   INSTRUCTIONS FOR DOWNLOADING THE MYCHART APP TO SMARTPHONE  - The patient must first make sure to have activated MyChart and know their login information - If Apple, go to [employer] and type in MyChart in the search bar and download the app. If Android, ask patient to go to [employer] and type in [place] in the search bar and download the app. The app is free but as with any other app downloads, their phone may require them to verify saved payment information or Apple/Android password.  -  The patient will need to then log into the app with their MyChart username and password, and select [facility] as their healthcare provider to link the account. When it is time for your visit, go to the MyChart app, find appointments, and click Begin Video Visit. Be sure to Select Allow for your device to access the Microphone and Camera for your visit. You will then be connected, and your provider will be with you shortly.  **If they have any issues connecting, or need assistance please contact MyChart service desk [phone])**  **If using a computer, in order to ensure the best quality for their visit they will need to use either of the following Internet Browsers: [employer], or Google Chrome**  IF USING DOXIMITY or DOXY.ME - The patient will receive a link just prior to their visit by text.     FULL LENGTH CONSENT FOR TELE-HEALTH VISIT   I hereby voluntarily request, consent and authorize [facility] and its employed or contracted physicians, physician assistants, nurse practitioners or other licensed health care professionals (the Practitioner), to provide me with telemedicine health care services (the Services") as deemed necessary by the treating Practitioner. I acknowledge and consent to receive the Services by the Practitioner via telemedicine. I understand that the telemedicine visit will involve communicating with the Practitioner through live audiovisual communication technology and the disclosure of certain medical information by electronic transmission. I acknowledge that I have been given the opportunity to request an in-person assessment or other available alternative prior to the telemedicine visit and am voluntarily participating in the telemedicine visit.  I understand that I have the right to withhold or withdraw my consent to the use of telemedicine in the course of my care at any time, without affecting my right to future care or treatment, and that the  Practitioner or I may terminate the telemedicine visit at any time. I understand that I have the right to inspect all information obtained and/or recorded in the course of the telemedicine visit and may receive copies of available information for a reasonable fee.  I understand that some of the potential risks of receiving the Services via telemedicine include:   Delay or interruption in medical evaluation due to technological equipment failure or disruption;  Information transmitted may not be sufficient (e.g. poor resolution of images) to allow for appropriate medical decision making by the Practitioner; and/or   In rare instances, security protocols could fail, causing a breach of personal health information.  Furthermore, I acknowledge that it is my responsibility to provide information about my medical history, conditions and care that is complete and accurate to the best of my ability. I acknowledge that Practitioner's advice, recommendations, and/or decision may be based on factors not within their control, such as incomplete or inaccurate data provided by me or distortions of diagnostic images or specimens that may result from electronic transmissions. I understand that the practice  of medicine is not an Chief Strategy Officer and that Practitioner makes no warranties or guarantees regarding treatment outcomes. I acknowledge that I will receive a copy of this consent concurrently upon execution via email to the email address I last provided but may also request a printed copy by calling the office of [facility].    I understand that my insurance will be billed for this visit.   I have read or had this consent read to me.  I understand the contents of this consent, which adequately explains the benefits and risks of the Services being provided via telemedicine.   I have been provided ample opportunity to ask questions regarding this consent and the Services and have had my questions answered to my  satisfaction.  I give my informed consent for the services to be provided through the use of telemedicine in my medical care  By participating in this telemedicine visit I agree to the above.

## 2019-05-30 ENCOUNTER — Encounter: Payer: Self-pay | Admitting: Cardiovascular Disease

## 2019-05-30 ENCOUNTER — Telehealth (INDEPENDENT_AMBULATORY_CARE_PROVIDER_SITE_OTHER): Payer: Medicare Other | Admitting: Cardiovascular Disease

## 2019-05-30 VITALS — BP 107/65 | HR 80 | Ht 64.0 in | Wt 235.0 lb

## 2019-05-30 DIAGNOSIS — E118 Type 2 diabetes mellitus with unspecified complications: Secondary | ICD-10-CM

## 2019-05-30 DIAGNOSIS — E785 Hyperlipidemia, unspecified: Secondary | ICD-10-CM

## 2019-05-30 DIAGNOSIS — I10 Essential (primary) hypertension: Secondary | ICD-10-CM

## 2019-05-30 DIAGNOSIS — I25708 Atherosclerosis of coronary artery bypass graft(s), unspecified, with other forms of angina pectoris: Secondary | ICD-10-CM | POA: Diagnosis not present

## 2019-05-30 DIAGNOSIS — Z794 Long term (current) use of insulin: Secondary | ICD-10-CM

## 2019-05-30 NOTE — Progress Notes (Signed)
Virtual Visit via Telephone Note   This visit type was conducted due to national recommendations for restrictions regarding the COVID-19 Pandemic (e.g. social distancing) in an effort to limit this patient's exposure and mitigate transmission in our community.  Due to her co-morbid illnesses, this patient is at least at moderate risk for complications without adequate follow up.  This format is felt to be most appropriate for this patient at this time.  The patient did not have access to video technology/had technical difficulties with video requiring transitioning to audio format only (telephone).  All issues noted in this document were discussed and addressed.  No physical exam could be performed with this format.  Please refer to the patient's chart for her  consent to telehealth for [facility].   Date:  05/30/2019   ID:  [name], [name] [id number], MRN [id number]  Patient Location: Home Provider Location: Home  PCP:  Sherald Hess., MD  Cardiologist:  Kate Sable, MD  Electrophysiologist:  None   Evaluation Performed:  Follow-Up Visit  Chief Complaint:  CAD  History of Present Illness:    [name] is a 54 y.o. female with coronary artery disease and history of CABG. She underwent coronary angiography in 2014 and all of her bypass grafts were patent at that time. She also has a history of left sided chest wall muscle spasm which responds to muscle relaxants.  Most recent echocardiogram performed on 06/14/2017 showed normal left ventricular systolic and diastolic function, LVEF 60 to 65%.  She was hospitalized for pneumonia due to COVID-10 April 2019.  She has a residual cough and exertional dyspnea. She denies exertional chest pain.  She got engaged in August 2020.  She was diagnosed with fatty liver. She has modified her diet and has been exercising and has lost 15 lbs.   Past Medical History:  Diagnosis Date  . Allergic rhinitis   . Allergy    . Anxiety   . Asthma   . Blood transfusion without reported diagnosis    2010 CABG  . CAD (coronary artery disease)    4v CABG, 9/10; NL LVF, status post followup Cardiolite November 2012 no ischemia ejection fraction 65%  . Cataract    are forming  . Diabetes mellitus    Scheduled to go on insulin pump  . Dyslipidemia     LDL 22 mg percent on Crestor  . Ejection fraction   . Esophageal stricture   . Gastroparesis due to DM ([place])   . GERD (gastroesophageal reflux disease)   . Glaucoma   . History of heart attack   . Hx of CABG    September, 2010  . Hyperlipidemia   . Hypertension   . Hypothyroidism   . Left-sided chest wall pain   . Myocardial infarction ([employer]) 2010   2008 x 2 stenst, 209 x 2 stents. 01-2009 CABG x 4    Past Surgical History:  Procedure Laterality Date  . ANGIOPLASTY     with stenting 3154,0086  . CARDIAC SURGERY    . CESAREAN SECTION    . CORONARY ARTERY BYPASS GRAFT  01/26/2009  . EYE SURGERY    . open heart surgery   2010  . TUBAL LIGATION       Current Meds  Medication Sig  . albuterol (VENTOLIN HFA) 108 (90 Base) MCG/ACT inhaler Inhale 2 puffs into the lungs every 6 (six) hours as needed for wheezing or shortness of breath.  . ALPRAZolam (XANAX) 0.25  MG tablet Take 0.25 mg by mouth 2 (two) times daily as needed for anxiety or sleep.   Marland Kitchen aspirin 81 MG chewable tablet Chew 81 mg by mouth daily.  . chlorhexidine (PERIDEX) 0.12 % solution Use as directed 15 mLs in the mouth or throat 2 (two) times daily.   . Fluticasone Furoate-Vilanterol (BREO ELLIPTA IN) Inhale into the lungs.  . furosemide (LASIX) 40 MG tablet TAKE 1 TABLET (40 MG TOTAL) BY MOUTH DAILY. (Patient taking differently: Take 20 mg by mouth daily as needed for fluid. )  . Insulin Human (INSULIN PUMP) SOLN Inject into the skin See admin instructions. Pt uses up to 150 units of NOVOLOG daily VIA PUMP  . lansoprazole (PREVACID SOLUTAB) 30 MG disintegrating tablet Take 1 tablet (30 mg total)  by mouth daily.  Marland Kitchen levothyroxine (SYNTHROID) 125 MCG tablet Take 125 mcg by mouth every morning.   . Magnesium Oxide 500 MG CAPS Take 1 capsule by mouth every morning.   . metoprolol tartrate (LOPRESSOR) 25 MG tablet TAKE 1/2 TABLET BY MOUTH EVERY DAY (Patient taking differently: Take 12.5 mg by mouth every morning. )  . nitroGLYCERIN (NITROSTAT) 0.4 MG SL tablet Place 1 tablet (0.4 mg total) under the tongue every 5 (five) minutes as needed for chest pain.  Marland Kitchen NOVOLOG 100 UNIT/ML injection Inject 100-150 Units into the skin as directed. Use up to 100-150 units daily in insulin pump as needed  . oxyCODONE-acetaminophen (PERCOCET/ROXICET) 5-325 MG tablet Take 1 tablet by mouth 3 (three) times daily.   . potassium chloride (K-DUR) 10 MEQ tablet Take 1 tablet (10 mEq total) by mouth daily.  . Pseudoeph-Doxylamine-DM-APAP (NYQUIL PO) Take 30 mLs by mouth at bedtime as needed (for cough).  . rosuvastatin (CRESTOR) 5 MG tablet Take 1 tablet (5 mg total) by mouth daily. (Patient taking differently: Take 10 mg by mouth daily. )  . vitamin C (ASCORBIC ACID) 500 MG tablet Take 500 mg by mouth daily.  . Vitamin D, Ergocalciferol, (DRISDOL) 1.25 MG (50000 UT) CAPS capsule Take 50,000 Units by mouth every Monday.   . zinc sulfate 220 (50 Zn) MG capsule Take 1 capsule (220 mg total) by mouth daily.  . [DISCONTINUED] dexamethasone (DECADRON) 6 MG tablet Take 1 tablet (6 mg total) by mouth daily.  . [DISCONTINUED] hyoscyamine (LEVSIN SL) 0.125 MG SL tablet PLACE 1 TABLET (0.125 MG) UNDER THE TONGUE ONCE TO TWICE A DAY, AS NEEDED (Patient taking differently: Take 0.125 mg by mouth 2 (two) times daily. )  . [DISCONTINUED] metFORMIN (GLUCOPHAGE) 500 MG tablet Take 1,000 mg by mouth at bedtime.  . [DISCONTINUED] sertraline (ZOLOFT) 25 MG tablet Take 25 mg by mouth daily.  . [DISCONTINUED] SUPREP BOWEL PREP KIT 17.5-3.13-1.6 GM/177ML SOLN Suprep-Use as directed     Allergies:   Metformin, Penicillins, and  Metoclopramide   Social History   Tobacco Use  . Smoking status: Former Smoker    Packs/day: 1.00    Years: 3.00    Pack years: 3.00    Types: Cigarettes    Start date: 03/14/1991    Quit date: 05/22/1993    Years since quitting: 26.0  . Smokeless tobacco: Never Used  . Tobacco comment:  Year Quit: 1995  Substance Use Topics  . Alcohol use: No    Alcohol/week: 0.0 standard drinks  . Drug use: No     Family Hx: The patient's family history includes Asthma in her father and paternal aunt; COPD in her father; Diabetes in her mother; Heart disease  in her father. There is no history of Colon cancer, Colon polyps, Esophageal cancer, Rectal cancer, or Stomach cancer.  ROS:   Please see the history of present illness.     All other systems reviewed and are negative.   Prior CV studies:   The following studies were reviewed today:  Reviewed above  Labs/Other Tests and Data Reviewed:    EKG:  No ECG reviewed.  Recent Labs: 04/18/2019: Magnesium 1.9 04/21/2019: ALT 24; BUN 14; Creatinine, Ser 0.77; Hemoglobin 12.5; Platelets 324; Potassium 4.4; Sodium 136   Recent Lipid Panel Lab Results  Component Value Date/Time   CHOL  01/24/2009 04:00 AM    185        ATP III CLASSIFICATION:  <200     mg/dL   Desirable  200-239  mg/dL   Borderline High  >=240    mg/dL   High          TRIG 193 (H) 04/16/2019 11:43 AM   HDL 34 (L) 01/24/2009 04:00 AM   CHOLHDL 5.4 01/24/2009 04:00 AM   LDLCALC (H) 01/24/2009 04:00 AM    115        Total Cholesterol/HDL:CHD Risk Coronary Heart Disease Risk Table                     Men   Women  1/2 Average Risk   3.4   3.3  Average Risk       5.0   4.4  2 X Average Risk   9.6   7.1  3 X Average Risk  23.4   11.0        Use the calculated Patient Ratio above and the CHD Risk Table to determine the patient's CHD Risk.        ATP III CLASSIFICATION (LDL):  <100     mg/dL   Optimal  100-129  mg/dL   Near or Above                    Optimal   130-159  mg/dL   Borderline  160-189  mg/dL   High  >190     mg/dL   Very High    Wt Readings from Last 3 Encounters:  05/30/19 235 lb (106.6 kg)  04/16/19 233 lb (105.7 kg)  01/15/19 244 lb 8 oz (110.9 kg)     Objective:    Vital Signs:  BP 107/65   Pulse 80   Ht '5\' 4"'$  (1.626 m)   Wt 235 lb (106.6 kg)   LMP 12/06/2017   BMI 40.34 kg/m    VITAL SIGNS:  reviewed  ASSESSMENT & PLAN:    1. CAD with CABG: Symptomatically stable Continue ASA.  She takes Lopressor 12.5 mg once daily because taking it twice daily exacerbates her asthma.  I previously prescribed bisoprolol but she remains on Lopressor as she did not tolerate it.  She takes rosuvastatin 10 mg daily.  2. Essential HTN: Controlled. No changes.  3. Hyperlipidemia:  She is taking rosuvastatin 10 mg daily.  I will obtain a copy of lipids from PCP.  4. Type 2 insulin-dependent diabetes mellitus:She remains on insulin and has aninsulin pump.    COVID-19 Education: The signs and symptoms of COVID-19 were discussed with the patient and how to seek care for testing (follow up with PCP or arrange E-visit).  The importance of social distancing was discussed today.  Time:   Today, I have spent 10 minutes with the patient  with telehealth technology discussing the above problems.     Medication Adjustments/Labs and Tests Ordered: Current medicines are reviewed at length with the patient today.  Concerns regarding medicines are outlined above.   Tests Ordered: No orders of the defined types were placed in this encounter.   Medication Changes: No orders of the defined types were placed in this encounter.   Follow Up:  Virtual Visit  in 6 month(s)  Signed, Kate Sable, MD  05/30/2019 3:59 PM    [facility]

## 2019-05-30 NOTE — Patient Instructions (Signed)
Your physician recommends that you schedule a follow-up appointment in : WITH DR [facility]  Your physician recommends that you continue on your current medications as directed. Please refer to the Current Medication list given to you today.  Thank you for choosing [facility]!!

## 2019-07-16 IMAGING — DX DG CHEST 2V
2 series · 2 of 2 positions shown · non-contrast
Comparison: 03/19/2017

CLINICAL DATA: Throat tightness

EXAM:
CHEST  2 VIEW

[chest pa]
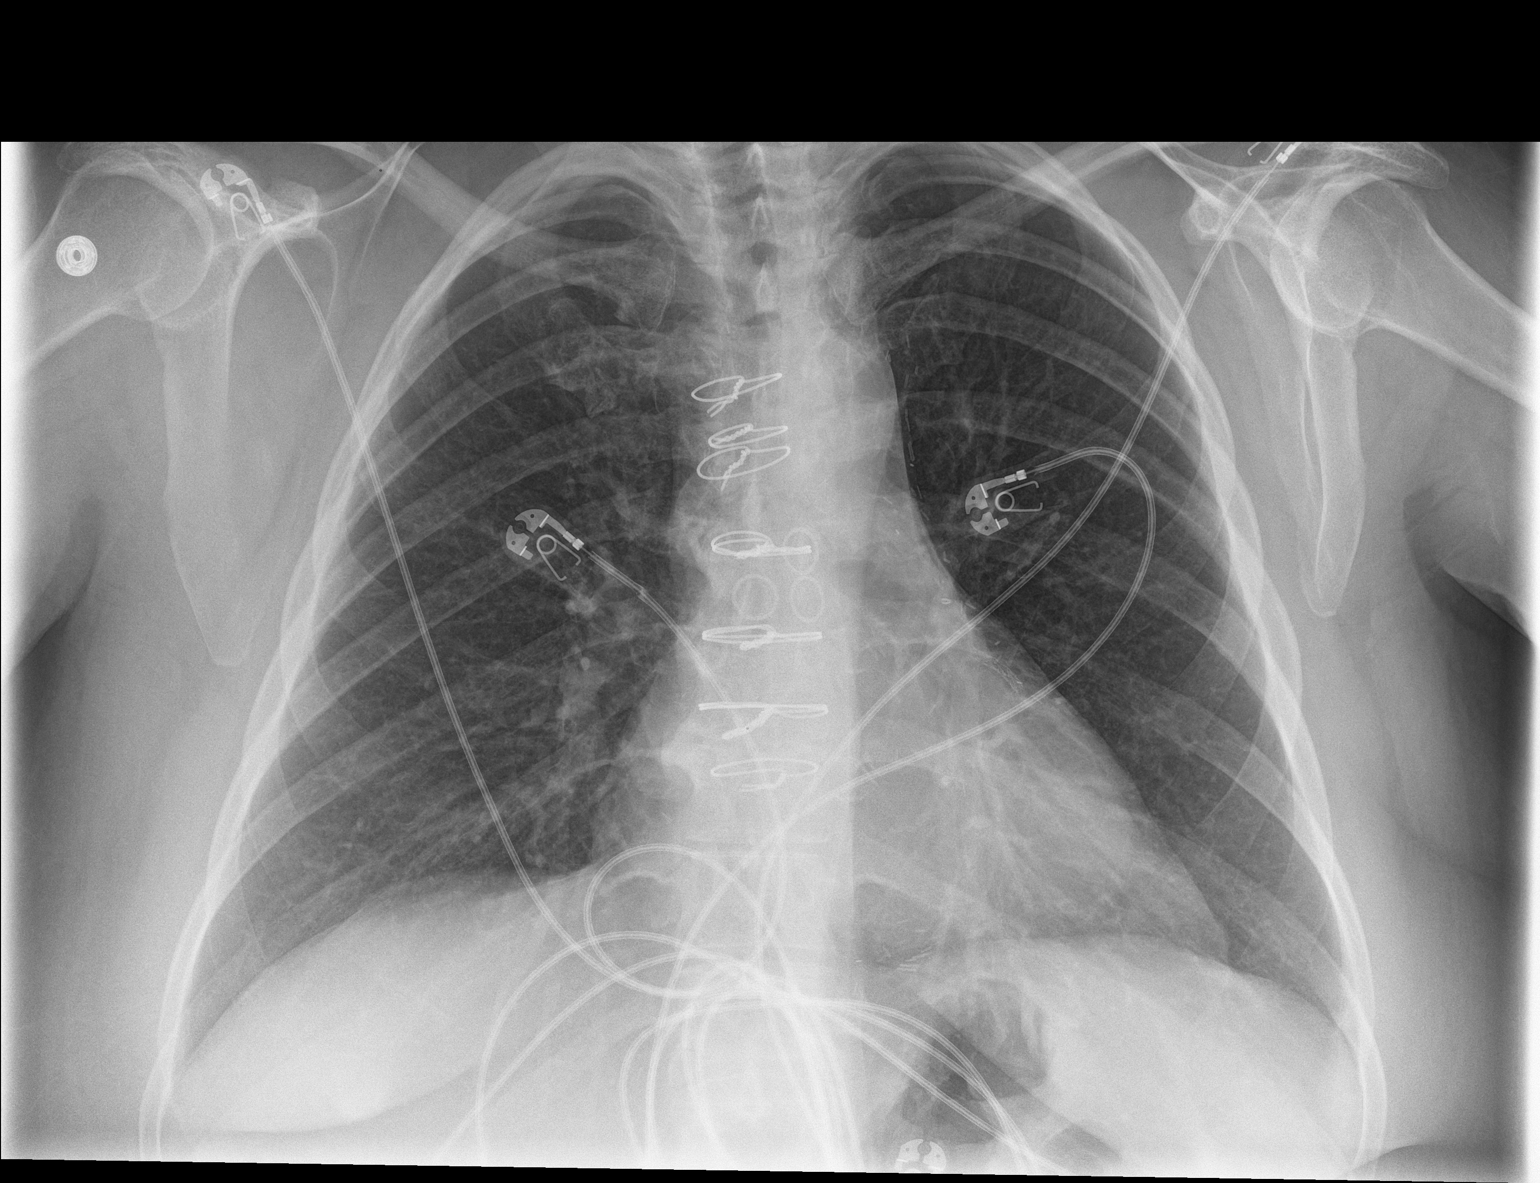

[chest lat]
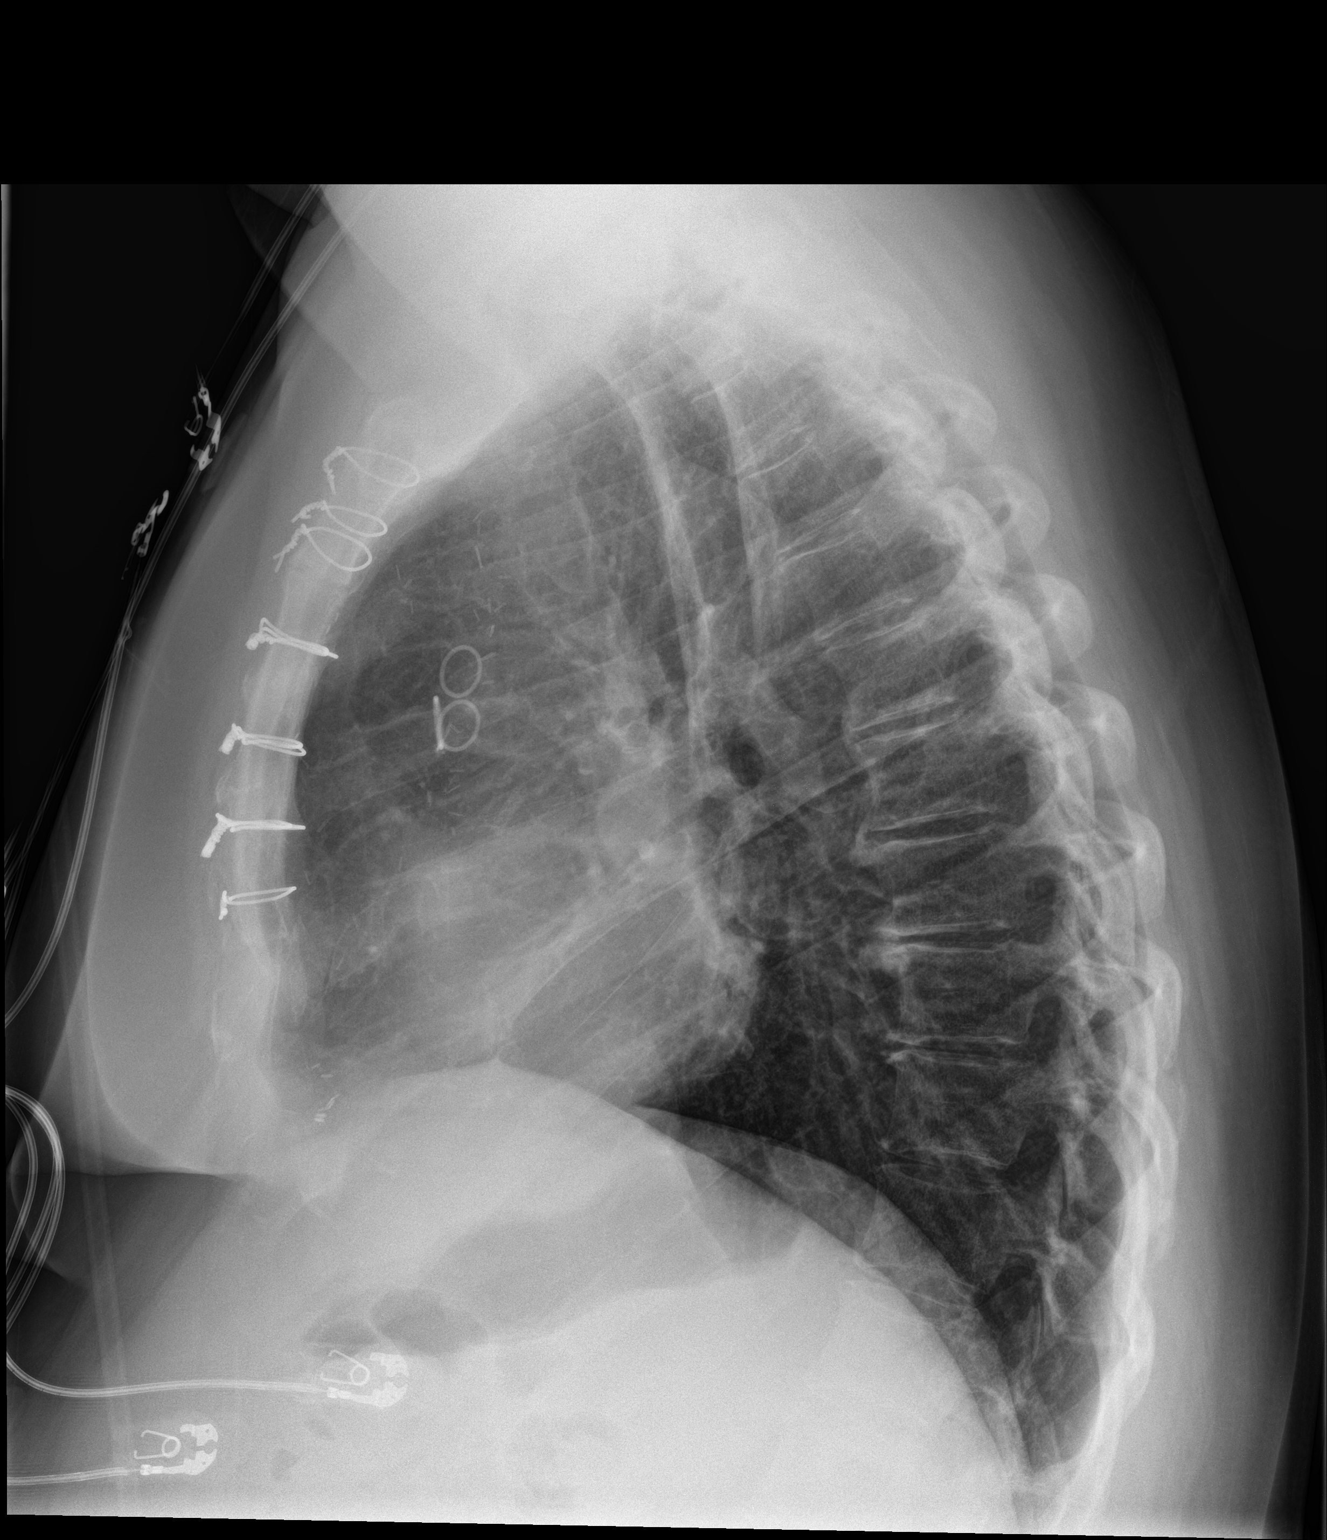

[2 of 2 positions shown; findings below may reference images not displayed]

FINDINGS: Prior CABG. Heart and mediastinal contours are within normal limits.
No focal opacities or effusions. No acute bony abnormality.
IMPRESSION: No active cardiopulmonary disease.

## 2019-07-16 IMAGING — CT CT NECK W/ CM
3 of 4 series · 13 of 33 positions shown, 16 images · IV contrast (iopamidol)
Comparison: None.

CLINICAL DATA: Sore throat stridor. Suspect tonsillitis or
epiglottitis

EXAM:
CT NECK WITH CONTRAST
TECHNIQUE: Multidetector CT imaging of the neck was performed using the
standard protocol following the bolus administration of intravenous
contrast.
CONTRAST:  75mL F2UI8H-NQQ IOPAMIDOL (F2UI8H-NQQ) INJECTION 61%

[Series 2: axial neck · axial · 0.45mm/px · z∈[+1166,+1314]mm · 5 of 112 slices shown, 7 images]
[im 19/112  soft-tissue]
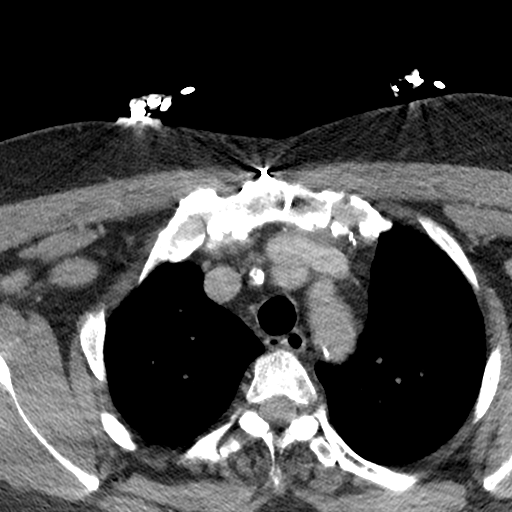
[im 19/112  bone]
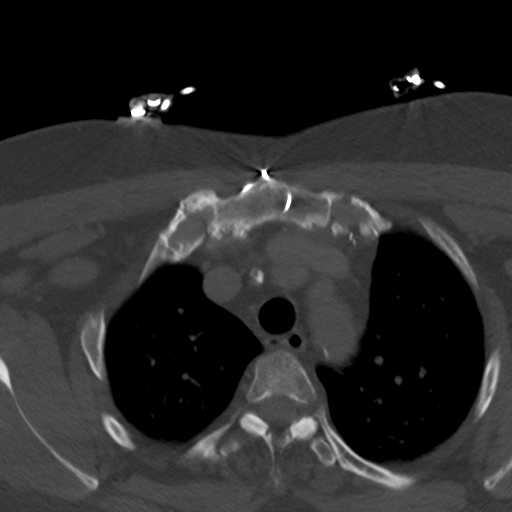
[im 38/112  bone]
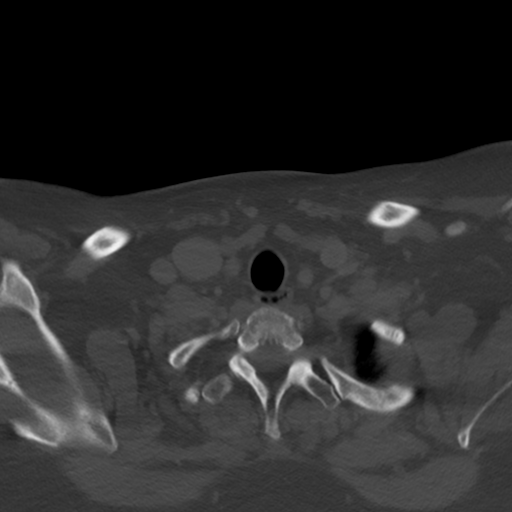
[im 56/112  bone]
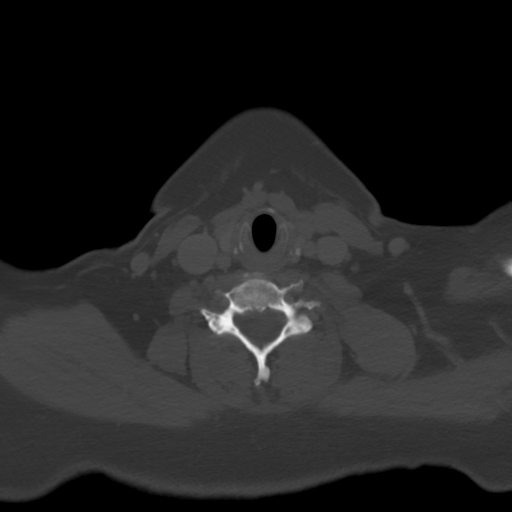
[im 75/112  bone]
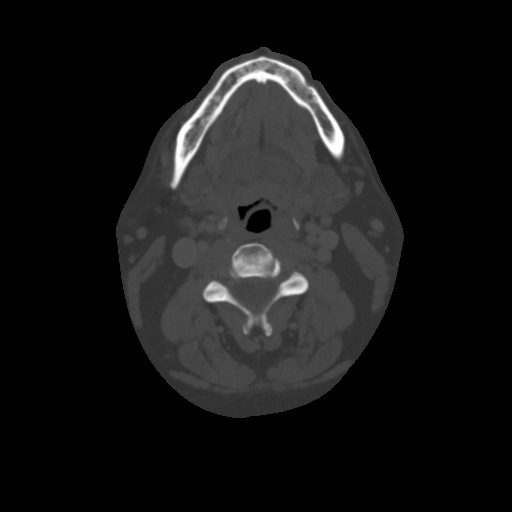
[im 93/112  soft-tissue]
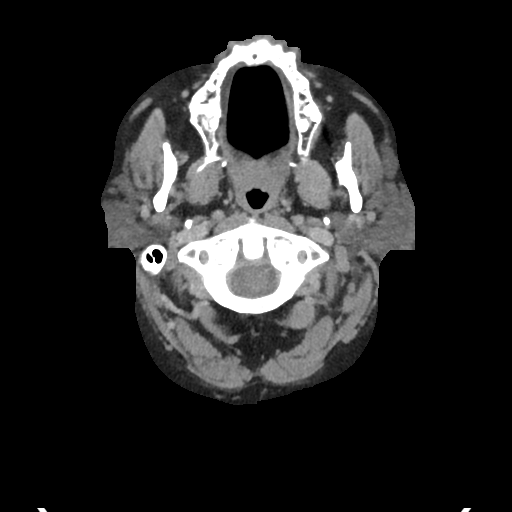
[im 93/112  bone]
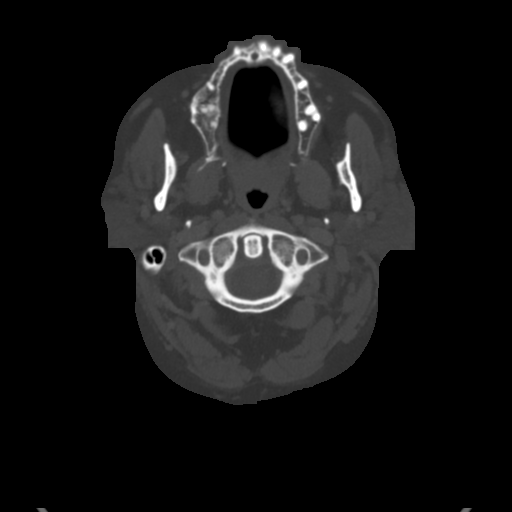

[Series 6: coronal neck · coronal · 0.34mm/px · 3 of 101 slices shown]
[im 21/101  bone]
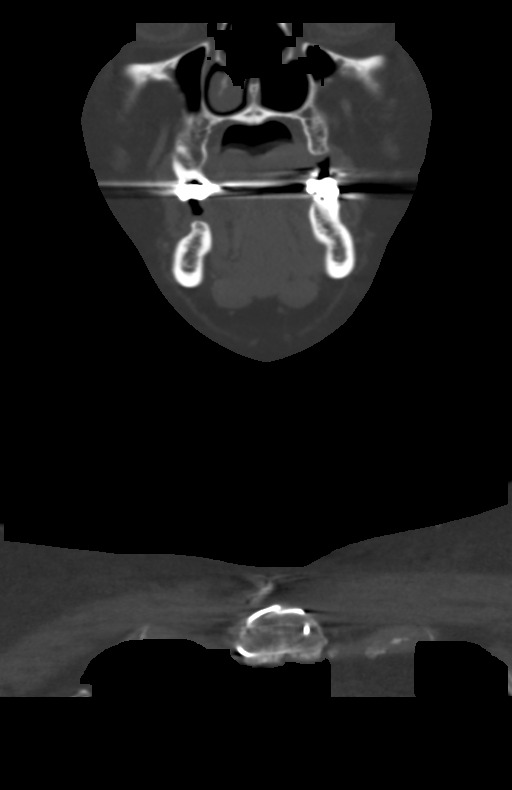
[im 41/101  bone]
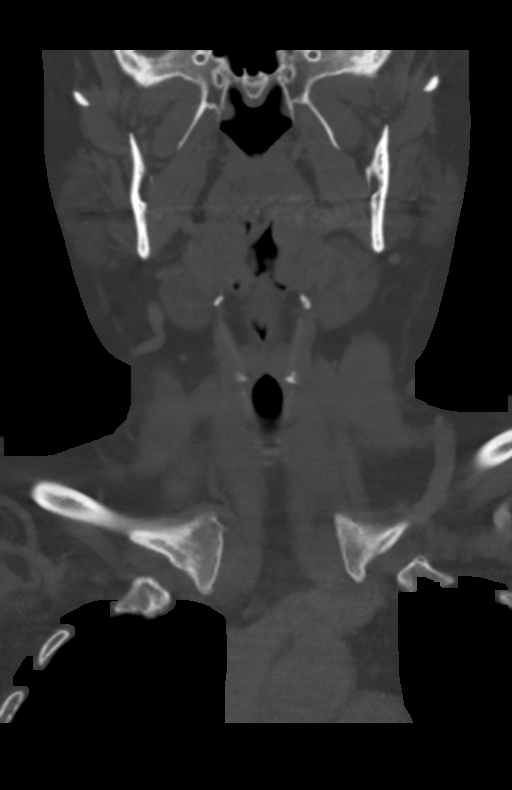
[im 61/101  bone]
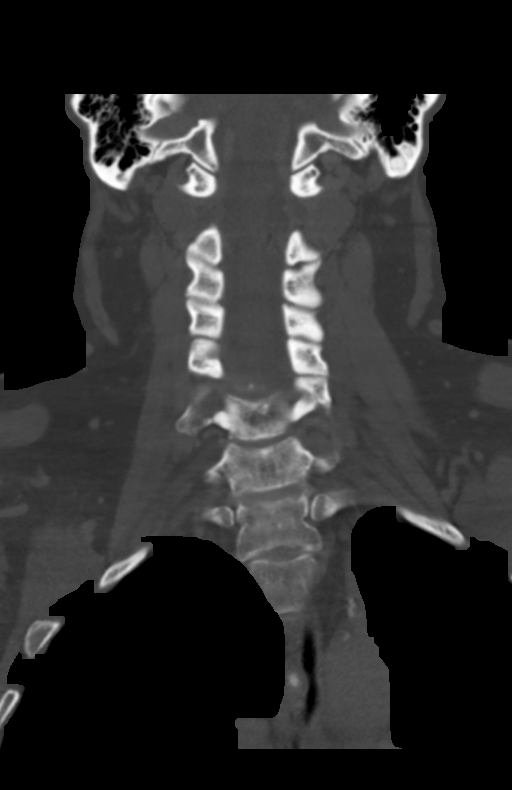

[Series 7: sagittal neck · sagittal · 0.40mm/px · 5 of 101 slices shown, 6 images]
[im 34/101  bone]
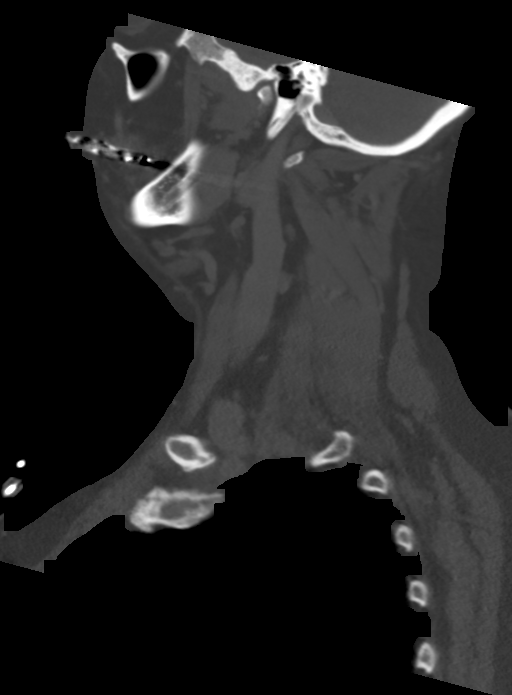
[im 42/101  bone]
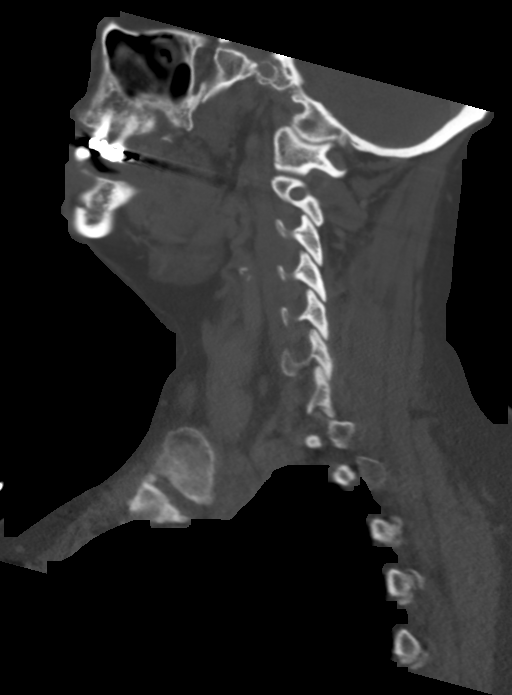
[im 51/101  soft-tissue]
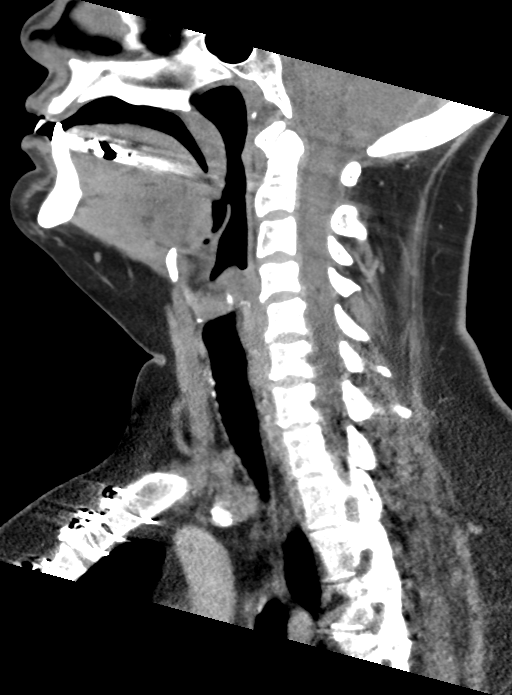
[im 51/101  bone]
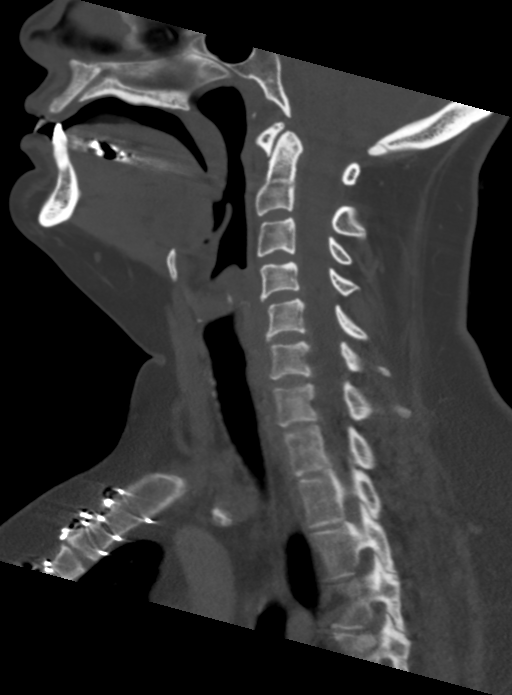
[im 59/101  bone]
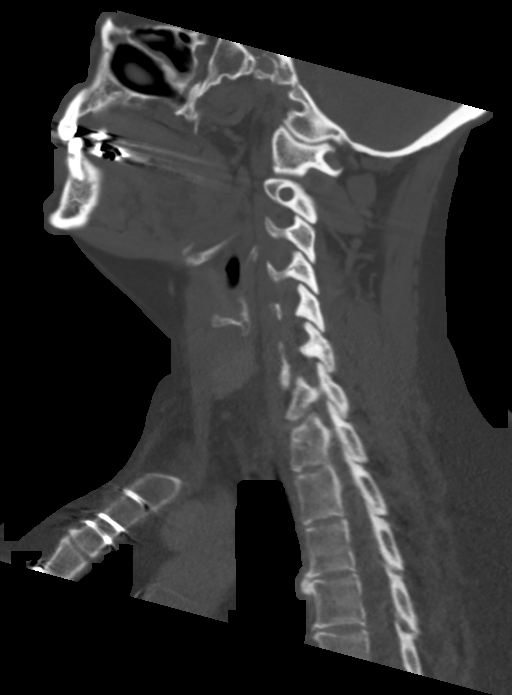
[im 67/101  bone]
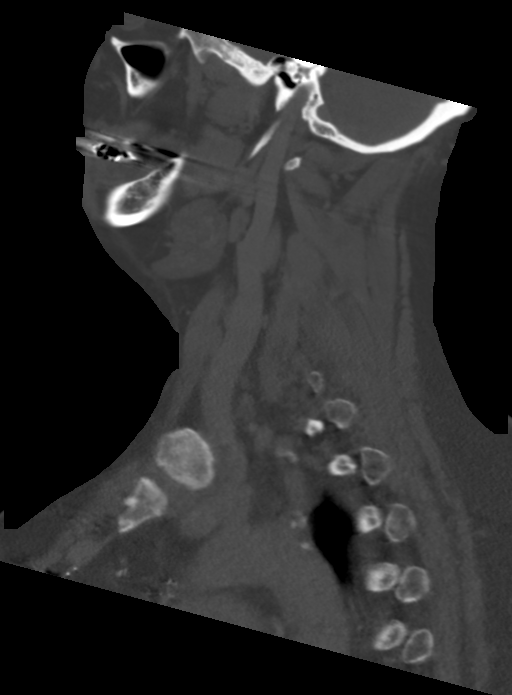

[13 of 33 positions shown; findings below may reference images not displayed]

FINDINGS: Pharynx and larynx: Normal. No mass or swelling. Normal tonsils.
Epiglottis normal.

Salivary glands: No inflammation, mass, or stone.

Thyroid: Negative

Lymph nodes: No pathologic or enlarged lymph nodes

Vascular: Mild atherosclerotic disease right carotid bifurcation.
Jugular vein patent bilaterally.

Limited intracranial: Negative

Visualized orbits: Negative

Mastoids and visualized paranasal sinuses: Negative

Skeleton: No acute skeletal abnormality.

Upper chest: Negative

Other: None
IMPRESSION: Negative CT neck. No evidence of tonsillitis or epiglottitis. Normal
airway.

## 2019-08-06 ENCOUNTER — Other Ambulatory Visit: Payer: Self-pay | Admitting: Cardiovascular Disease

## 2019-08-06 ENCOUNTER — Ambulatory Visit: Payer: Medicare Other | Admitting: Gastroenterology

## 2019-08-16 ENCOUNTER — Other Ambulatory Visit: Payer: Self-pay | Admitting: Gastroenterology

## 2019-08-19 ENCOUNTER — Other Ambulatory Visit: Payer: Self-pay | Admitting: Gastroenterology

## 2019-10-28 ENCOUNTER — Other Ambulatory Visit: Payer: Self-pay | Admitting: Gastroenterology

## 2019-10-28 MED ORDER — LANSOPRAZOLE 30 MG PO TBDD: 30.0000 mg | DELAYED_RELEASE_TABLET | Freq: Every day | ORAL | 1 refills | Status: DC

## 2019-10-28 NOTE — Telephone Encounter (Signed)
Prescription sent to patient's pharmacy.

## 2019-10-28 NOTE — Telephone Encounter (Signed)
Patient needs to refill Prevacid solutab. Please send it to [facility] pharmacy in [place].

## 2019-11-05 ENCOUNTER — Other Ambulatory Visit: Payer: Self-pay | Admitting: Cardiovascular Disease

## 2019-11-07 NOTE — Telephone Encounter (Signed)
Patient's pharmacy sent a prior auth request for lansoprazole. Note sent to pharmacy that patient needs appointment.

## 2019-11-10 ENCOUNTER — Telehealth: Payer: Self-pay | Admitting: Gastroenterology

## 2019-11-11 NOTE — Telephone Encounter (Signed)
LM for pt that we have not rec'd a PA request. Asked her to call ne back to discuss options.

## 2019-11-11 NOTE — Telephone Encounter (Signed)
Thanks Jan, I can't recall, is there a reason she can't take pills? I see some others on her list. IF she can take pills or capsules, can do omeprazole or protonix 40mg  once to twice daily. If she can't then okay to crack omeprazole open and swallow contents 40mg  once to twice daily. Thanks

## 2019-11-11 NOTE — Telephone Encounter (Signed)
Spoke to pt. Scheduled her for a OV on 7-30.  In the interim would you recommend something else in place of lansoprazole solutabs? Could she crack open omeprazole capsules?  Please advise. Thanks.

## 2019-11-12 MED ORDER — OMEPRAZOLE 40 MG PO CPDR: DELAYED_RELEASE_CAPSULE | ORAL | 1 refills | Status: DC

## 2019-11-12 NOTE — Telephone Encounter (Signed)
Called and spoke to pt.  Relayed instructions and send script for omep 40 mg once to twice daily and to open capsule and ingest contents. She expressed understanding.

## 2019-11-17 ENCOUNTER — Other Ambulatory Visit: Payer: Self-pay | Admitting: Cardiovascular Disease

## 2019-11-28 ENCOUNTER — Encounter: Payer: Self-pay | Admitting: Obstetrics & Gynecology

## 2019-11-28 ENCOUNTER — Other Ambulatory Visit (HOSPITAL_COMMUNITY)
Admission: RE | Admit: 2019-11-28 | Discharge: 2019-11-28 | Disposition: A | Discharge status: Home / Self Care | Payer: Medicare Other | Source: Ambulatory Visit | Attending: Obstetrics & Gynecology | Admitting: Obstetrics & Gynecology

## 2019-11-28 ENCOUNTER — Ambulatory Visit (INDEPENDENT_AMBULATORY_CARE_PROVIDER_SITE_OTHER): Payer: Medicare Other | Admitting: Obstetrics & Gynecology

## 2019-11-28 VITALS — BP 127/66 | HR 67 | Ht 63.0 in | Wt 240.5 lb

## 2019-11-28 DIAGNOSIS — Z1211 Encounter for screening for malignant neoplasm of colon: Secondary | ICD-10-CM

## 2019-11-28 DIAGNOSIS — Z01419 Encounter for gynecological examination (general) (routine) without abnormal findings: Secondary | ICD-10-CM | POA: Diagnosis present

## 2019-11-28 DIAGNOSIS — Z1212 Encounter for screening for malignant neoplasm of rectum: Secondary | ICD-10-CM

## 2019-11-28 DIAGNOSIS — Z9851 Tubal ligation status: Secondary | ICD-10-CM | POA: Diagnosis not present

## 2019-11-28 DIAGNOSIS — Z0000 Encounter for general adult medical examination without abnormal findings: Secondary | ICD-10-CM | POA: Diagnosis not present

## 2019-11-28 DIAGNOSIS — Z1151 Encounter for screening for human papillomavirus (HPV): Secondary | ICD-10-CM | POA: Diagnosis not present

## 2019-11-28 NOTE — Progress Notes (Signed)
Subjective:     [name] is a 54 y.o. female here for a routine exam.  Patient's last menstrual period was 09/22/2019. No obstetric history on file. Birth Control Method:  Tubal ligation Menstrual Calendar(currently): irregular  Current complaints: .   Current acute medical issues:  IDDM/ASCVD   Recent Gynecologic History Patient's last menstrual period was 09/22/2019. Last Pap: 2017,  +HPV Last mammogram: 2009,  normal  Past Medical History:  Diagnosis Date  . Allergic rhinitis   . Allergy   . Anxiety   . Asthma   . Blood transfusion without reported diagnosis    2010 CABG  . CAD (coronary artery disease)    4v CABG, 9/10; NL LVF, status post followup Cardiolite November 2012 no ischemia ejection fraction 65%  . Cataract    are forming  . Diabetes mellitus    Scheduled to go on insulin pump  . Dyslipidemia     LDL 22 mg percent on Crestor  . Ejection fraction   . Esophageal stricture   . Gastroparesis due to DM (HCC)   . GERD (gastroesophageal reflux disease)   . Glaucoma   . History of heart attack   . Hx of CABG    September, 2010  . Hyperlipidemia   . Hypertension   . Hypothyroidism   . Left-sided chest wall pain   . Myocardial infarction (HCC) 2010   2008 x 2 stenst, 209 x 2 stents. 01-2009 CABG x 4     Past Surgical History:  Procedure Laterality Date  . ANGIOPLASTY     with stenting 3329,5188  . CARDIAC SURGERY    . CESAREAN SECTION    . CORONARY ARTERY BYPASS GRAFT  01/26/2009  . EYE SURGERY    . open heart surgery   2010  . TUBAL LIGATION      OB History   No obstetric history on file.     Social History   Socioeconomic History  . Marital status: Single    Spouse name: Not on file  . Number of children: 1  . Years of education: Not on file  . Highest education level: Not on file  Occupational History  . Occupation: disabilied  Tobacco Use  . Smoking status: Former Smoker    Packs/day: 1.00    Years: 3.00    Pack years: 3.00     Types: Cigarettes    Start date: 03/14/1991    Quit date: 05/22/1993    Years since quitting: 26.5  . Smokeless tobacco: Never Used  . Tobacco comment:  Year Quit: 1995  Vaping Use  . Vaping Use: Never used  Substance and Sexual Activity  . Alcohol use: No    Alcohol/week: 0.0 standard drinks  . Drug use: No  . Sexual activity: Yes  Other Topics Concern  . Not on file  Social History Narrative   [facility] (03/19/17):   Originally from [place], [place]. Has always lived in [place]. Previously worked at [employer]. She is disabled due to her prior cardiac history. 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 No pets currently. No bird exposure. She reports she did have mold in a prior home that she lived in for 3 years. She has since moved to a new home. She enjoys walking.    Social Determinants of Health   Financial Resource Strain:   . Difficulty of Paying Living Expenses:   Food Insecurity:   . Worried About Radiation protection practitioner  of Food in the Last Year:   . Ran Out of Food in the Last Year:   Transportation Needs:   . Lack of Transportation (Medical):   Marland Kitchen. Lack of Transportation (Non-Medical):   Physical Activity:   . Days of Exercise per Week:   . Minutes of Exercise per Session:   Stress:   . Feeling of Stress :   Social Connections:   . Frequency of Communication with Friends and Family:   . Frequency of Social Gatherings with Friends and Family:   . Attends Religious Services:   . Active Member of Clubs or Organizations:   . Attends BankerClub or Organization Meetings:   Marland Kitchen. Marital Status:     Family History  Problem Relation Age of Onset  . Heart disease Father   . Asthma Father   . COPD Father   . Diabetes Mother   . Asthma Paternal Aunt   . Colon cancer Neg Hx   . Colon polyps Neg Hx   . Esophageal cancer Neg Hx   . Rectal cancer Neg Hx   . Stomach cancer Neg Hx      Current Outpatient Medications:  .  albuterol  (VENTOLIN HFA) 108 (90 Base) MCG/ACT inhaler, Inhale 2 puffs into the lungs every 6 (six) hours as needed for wheezing or shortness of breath., Disp: 8 g, Rfl: 1 .  ALPRAZolam (XANAX) 0.25 MG tablet, Take 0.25 mg by mouth 2 (two) times daily as needed for anxiety or sleep. , Disp: , Rfl: 1 .  aspirin 81 MG chewable tablet, Chew 81 mg by mouth daily., Disp: , Rfl:  .  furosemide (LASIX) 40 MG tablet, TAKE 1 TABLET BY MOUTH EVERY DAY, Disp: 30 tablet, Rfl: 0 .  Insulin Human (INSULIN PUMP) SOLN, Inject into the skin See admin instructions. Pt uses up to 150 units of NOVOLOG daily VIA PUMP, Disp: , Rfl:  .  lansoprazole (PREVACID SOLUTAB) 30 MG disintegrating tablet, Take 1 tablet (30 mg total) by mouth daily., Disp: 30 tablet, Rfl: 1 .  levothyroxine (SYNTHROID) 125 MCG tablet, Take 125 mcg by mouth every morning. , Disp: , Rfl:  .  Magnesium Oxide 500 MG CAPS, Take 1 capsule by mouth every morning. , Disp: , Rfl:  .  metoprolol tartrate (LOPRESSOR) 25 MG tablet, TAKE 1/2 TABLET BY MOUTH EVERY DAY, Disp: 45 tablet, Rfl: 0 .  nitroGLYCERIN (NITROSTAT) 0.4 MG SL tablet, Place 1 tablet (0.4 mg total) under the tongue every 5 (five) minutes as needed for chest pain., Disp: 25 tablet, Rfl: 3 .  NOVOLOG 100 UNIT/ML injection, Inject 100-150 Units into the skin as directed. Use up to 100-150 units daily in insulin pump as needed, Disp: , Rfl: 5 .  oxyCODONE-acetaminophen (PERCOCET/ROXICET) 5-325 MG tablet, Take 1 tablet by mouth 3 (three) times daily. , Disp: , Rfl: 0 .  potassium chloride (K-DUR) 10 MEQ tablet, Take 1 tablet (10 mEq total) by mouth daily., Disp: 30 tablet, Rfl: 6 .  Pseudoeph-Doxylamine-DM-APAP (NYQUIL PO), Take 30 mLs by mouth at bedtime as needed (for cough)., Disp: , Rfl:  .  rosuvastatin (CRESTOR) 5 MG tablet, Take 1 tablet (5 mg total) by mouth daily. (Patient taking differently: Take 10 mg by mouth daily. ), Disp: 30 tablet, Rfl: 6 .  vitamin C (ASCORBIC ACID) 500 MG tablet, Take 500 mg  by mouth daily., Disp: , Rfl:  .  Vitamin D, Ergocalciferol, (DRISDOL) 1.25 MG (50000 UT) CAPS capsule, Take 50,000 Units by mouth every Monday. ,  Disp: , Rfl:  .  zinc sulfate 220 (50 Zn) MG capsule, Take 1 capsule (220 mg total) by mouth daily., Disp: 10 capsule, Rfl: 0  Review of Systems  Review of Systems  Constitutional: Negative for fever, chills, weight loss, malaise/fatigue and diaphoresis.  HENT: Negative for hearing loss, ear pain, nosebleeds, congestion, sore throat, neck pain, tinnitus and ear discharge.   Eyes: Negative for blurred vision, double vision, photophobia, pain, discharge and redness.  Respiratory: Negative for cough, hemoptysis, sputum production, shortness of breath, wheezing and stridor.   Cardiovascular: Negative for chest pain, palpitations, orthopnea, claudication, leg swelling and PND.  Gastrointestinal: negative for abdominal pain. Negative for heartburn, nausea, vomiting, diarrhea, constipation, blood in stool and melena.  Genitourinary: Negative for dysuria, urgency, frequency, hematuria and flank pain.  Musculoskeletal: Negative for myalgias, back pain, joint pain and falls.  Skin: Negative for itching and rash.  Neurological: Negative for dizziness, tingling, tremors, sensory change, speech change, focal weakness, seizures, loss of consciousness, weakness and headaches.  Endo/Heme/Allergies: Negative for environmental allergies and polydipsia. Does not bruise/bleed easily.  Psychiatric/Behavioral: Negative for depression, suicidal ideas, hallucinations, memory loss and substance abuse. The patient is not nervous/anxious and does not have insomnia.        Objective:  Blood pressure 127/66, pulse 67, height 5\' 3"  (1.6 m), weight 240 lb 8 oz (109.1 kg), last menstrual period 09/22/2019.   Physical Exam  Vitals reviewed. Constitutional: She is oriented to person, place, and time. She appears well-developed and well-nourished.  HENT:  Head: Normocephalic and  atraumatic.        Right Ear: External ear normal.  Left Ear: External ear normal.  Nose: Nose normal.  Mouth/Throat: Oropharynx is clear and moist.  Eyes: Conjunctivae and EOM are normal. Pupils are equal, round, and reactive to light. Right eye exhibits no discharge. Left eye exhibits no discharge. No scleral icterus.  Neck: Normal range of motion. Neck supple. No tracheal deviation present. No thyromegaly present.  Cardiovascular: Normal rate, regular rhythm, normal heart sounds and intact distal pulses.  Exam reveals no gallop and no friction rub.   No murmur heard. Respiratory: Effort normal and breath sounds normal. No respiratory distress. She has no wheezes. She has no rales. She exhibits no tenderness.  GI: Soft. Bowel sounds are normal. She exhibits no distension and no mass. There is no tenderness. There is no rebound and no guarding.  Genitourinary:  Breasts no masses skin changes or nipple changes bilaterally      Vulva is normal without lesions Vagina is pink moist without discharge Cervix normal in appearance and pap is done Uterus is normal size shape and contour Adnexa is negative with normal sized ovaries  {Rectal    hemoccult negative, normal tone, no masses  Musculoskeletal: Normal range of motion. She exhibits no edema and no tenderness.  Neurological: She is alert and oriented to person, place, and time. She has normal reflexes. She displays normal reflexes. No cranial nerve deficit. She exhibits normal muscle tone. Coordination normal.  Skin: Skin is warm and dry. No rash noted. No erythema. No pallor.  Psychiatric: She has a normal mood and affect. Her behavior is normal. Judgment and thought content normal.       Medications Ordered at today's visit: No orders of the defined types were placed in this encounter.   Other orders placed at today's visit: No orders of the defined types were placed in this encounter.     Assessment:    Normal  Gyn exam.     Plan:    Contraception: tubal ligation. Mammogram ordered. Follow up in: 3 years.     No follow-ups on file.

## 2019-12-02 LAB — CYTOLOGY - PAP
Adequacy: ABSENT
Chlamydia: NEGATIVE
Comment: NEGATIVE
Comment: NEGATIVE
Comment: NORMAL
Diagnosis: NEGATIVE
High risk HPV: NEGATIVE
Neisseria Gonorrhea: NEGATIVE

## 2019-12-04 ENCOUNTER — Other Ambulatory Visit: Payer: Self-pay | Admitting: Gastroenterology

## 2019-12-19 ENCOUNTER — Encounter: Payer: Self-pay | Admitting: Gastroenterology

## 2019-12-19 ENCOUNTER — Ambulatory Visit (INDEPENDENT_AMBULATORY_CARE_PROVIDER_SITE_OTHER): Payer: Medicare Other | Admitting: Gastroenterology

## 2019-12-19 ENCOUNTER — Other Ambulatory Visit: Payer: Self-pay | Admitting: Gastroenterology

## 2019-12-19 VITALS — BP 124/60 | HR 73 | Ht 64.0 in | Wt 238.0 lb

## 2019-12-19 DIAGNOSIS — I25708 Atherosclerosis of coronary artery bypass graft(s), unspecified, with other forms of angina pectoris: Secondary | ICD-10-CM | POA: Diagnosis not present

## 2019-12-19 DIAGNOSIS — K760 Fatty (change of) liver, not elsewhere classified: Secondary | ICD-10-CM | POA: Diagnosis not present

## 2019-12-19 DIAGNOSIS — K3184 Gastroparesis: Secondary | ICD-10-CM

## 2019-12-19 DIAGNOSIS — Z860100 Personal history of colon polyps, unspecified: Secondary | ICD-10-CM

## 2019-12-19 DIAGNOSIS — K219 Gastro-esophageal reflux disease without esophagitis: Secondary | ICD-10-CM

## 2019-12-19 MED ORDER — METOCLOPRAMIDE HCL 5 MG PO TABS: 5.0000 mg | ORAL_TABLET | Freq: Three times a day (TID) | ORAL | 3 refills | Status: DC | PRN

## 2019-12-19 MED ORDER — LANSOPRAZOLE 30 MG PO TBDD: 30.0000 mg | DELAYED_RELEASE_TABLET | Freq: Every day | ORAL | 3 refills | Status: DC

## 2019-12-19 NOTE — Progress Notes (Signed)
HPI :  54 year old female here for a follow-up visit for GERD / gastroparesis, fatty liver.  She has a history of type 1 diabetes on insulin pump and coronary artery disease status post stenting and CABG. I followed her for history of gastroparesis and fatty liver.  At her last visit we recommended continuing Prevacid Solutab's for her reflux and I gave her some Reglan to use as needed for gastroparesis which had been working well for her in the past.  She had an intolerance to it initially in the past however we tried a low dose again and it worked pretty well and she tolerated it.  She has not been using it routinely, only as needed, and has not used it much recently at all.  She has been on domperidone in the past and did quite well on the regimen, however stopped it due to cost several months ago.  She states her gastroparesis is generally pretty well controlled, she is eating well, no nausea or vomiting.  Reflux well controlled on PPI.  She does not like taking pills, had been on Prevacid Solutab in this light, however insurance does not cover it that well, has been using omeprazole capsules cracking them open prior to ingestion, this has not worked quite as well for her and she wants to see if we can get her on the Solutab if possible.  In regards to her fatty liver we have been monitoring this over the past few years, her ALT has been normal.  Ultrasound in 2017 showed diffuse steatosis, she has history of morbid obesity and we had discussed weight loss, she is been doing really well with this since of last seen her, she has been working on this and has lost 15 pounds in the past 4 months or so, she is feeling better about this.  She denies any alcohol use.  She states [facility] obtained a FibroScan but we do not have the reports of that, will try to obtain that.  Otherwise she did not follow through with us for her last colonoscopy, [facility] as [facility].  She states she had 5 polyps removed and had a repeat in 3 years, we do not have that report on file.  She is otherwise doing well without any complaints today.  She states she wishes to continue her long-term GI care with our practice.   Prior workup: EGD 10/19/2016 - dilated with 18mm Savory with appropriate mucosal wrent at upper esophagus, 2cm HH EGD 04/19/16 - normal esophagus, empiric dilation with 18mm Savory showed small wrent in the upper esophagus, with normal stomach and duodenum, biopsies obtained showing normal small bowel and stomach EGD 03/2009 - mild esophagitis and GEJ stricture, dilated to 18mm GES 07/2011 - normal GES 2011 - normal  GES 04/14/2016 - delayed gastric emptying     Past Medical History:  Diagnosis Date  . Allergic rhinitis   . Allergy   . Anxiety   . Asthma   . Blood transfusion without reported diagnosis    2010 CABG  . CAD (coronary artery disease)    4v CABG, 9/10; NL LVF, status post followup Cardiolite November 2012 no ischemia ejection fraction 65%  . Cataract    are forming  . Diabetes mellitus    Scheduled to go on insulin pump  . Dyslipidemia     LDL 22 mg percent on Crestor  . Ejection fraction   . Esophageal stricture   .  Fatty liver   . Gastroparesis due to DM (HCC)   . GERD (gastroesophageal reflux disease)   . Glaucoma   . History of heart attack   . Hx of CABG    September, 2010  . Hyperlipidemia   . Hypertension   . Hypothyroidism   . Left-sided chest wall pain   . Myocardial infarction (HCC) 2010   2008 x 2 stenst, 209 x 2 stents. 01-2009 CABG x 4      Past Surgical History:  Procedure Laterality Date  . ANGIOPLASTY     with stenting 1191,4782  . CARDIAC SURGERY    . CESAREAN SECTION    . CORONARY ARTERY BYPASS GRAFT  01/26/2009  . EYE SURGERY    . open heart surgery   2010  . TUBAL LIGATION     Family History  Problem Relation Age of Onset  . Heart disease Father   . Asthma Father     . COPD Father   . Diabetes Mother   . Asthma Paternal Aunt   . Asthma Son   . Colon cancer Neg Hx   . Colon polyps Neg Hx   . Esophageal cancer Neg Hx   . Rectal cancer Neg Hx   . Stomach cancer Neg Hx    Social History   Tobacco Use  . Smoking status: Former Smoker    Packs/day: 1.00    Years: 3.00    Pack years: 3.00    Types: Cigarettes    Start date: 03/14/1991    Quit date: 05/22/1993    Years since quitting: 26.5  . Smokeless tobacco: Never Used  . Tobacco comment:  Year Quit: 1995  Vaping Use  . Vaping Use: Never used  Substance Use Topics  . Alcohol use: No    Alcohol/week: 0.0 standard drinks  . Drug use: No   Current Outpatient Medications  Medication Sig Dispense Refill  . albuterol (VENTOLIN HFA) 108 (90 Base) MCG/ACT inhaler Inhale 2 puffs into the lungs every 6 (six) hours as needed for wheezing or shortness of breath. 8 g 1  . ALPRAZolam (XANAX) 0.25 MG tablet Take 0.25 mg by mouth 2 (two) times daily as needed for anxiety or sleep.   1  . aspirin 81 MG chewable tablet Chew 81 mg by mouth daily.    . furosemide (LASIX) 40 MG tablet TAKE 1 TABLET BY MOUTH EVERY DAY 30 tablet 0  . Insulin Human (INSULIN PUMP) SOLN Inject into the skin See admin instructions. Pt uses up to 150 units of NOVOLOG daily VIA PUMP    . lansoprazole (PREVACID SOLUTAB) 30 MG disintegrating tablet Take 1 tablet (30 mg total) by mouth daily. 30 tablet 1  . levothyroxine (SYNTHROID) 125 MCG tablet Take 125 mcg by mouth every morning.     . Magnesium Oxide 500 MG CAPS Take 1 capsule by mouth every morning.     . metoprolol tartrate (LOPRESSOR) 25 MG tablet TAKE 1/2 TABLET BY MOUTH EVERY DAY 45 tablet 0  . nitroGLYCERIN (NITROSTAT) 0.4 MG SL tablet Place 1 tablet (0.4 mg total) under the tongue every 5 (five) minutes as needed for chest pain. 25 tablet 3  . NOVOLOG 100 UNIT/ML injection Inject 100-150 Units into the skin as directed. Use up to 100-150 units daily in insulin pump as needed  5   . omeprazole (PRILOSEC) 40 MG capsule OPEN 1 CAPSULE (40 MG) AND INGEST CONTENTS ONCE TO TWICE DAILY 60 capsule 1  . oxyCODONE-acetaminophen (PERCOCET/ROXICET) 5-325 MG  tablet Take 1 tablet by mouth 3 (three) times daily.   0  . potassium chloride (K-DUR) 10 MEQ tablet Take 1 tablet (10 mEq total) by mouth daily. 30 tablet 6  . Pseudoeph-Doxylamine-DM-APAP (NYQUIL PO) Take 30 mLs by mouth at bedtime as needed (for cough).    . rosuvastatin (CRESTOR) 5 MG tablet Take 1 tablet (5 mg total) by mouth daily. (Patient taking differently: Take 10 mg by mouth daily. ) 30 tablet 6  . TRELEGY ELLIPTA 100-62.5-25 MCG/INH AEPB Inhale 1 puff into the lungs daily.    . vitamin C (ASCORBIC ACID) 500 MG tablet Take 500 mg by mouth daily.    . Vitamin D, Ergocalciferol, (DRISDOL) 1.25 MG (50000 UT) CAPS capsule Take 50,000 Units by mouth every Monday.     . zinc sulfate 220 (50 Zn) MG capsule Take 1 capsule (220 mg total) by mouth daily. 10 capsule 0   No current facility-administered medications for this visit.   Allergies  Allergen Reactions  . Metformin Anaphylaxis  . Penicillins Anaphylaxis and Other (See Comments)    Has patient had a PCN reaction causing immediate rash, facial/tongue/throat swelling, SOB or lightheadedness with hypotension: Yes Has patient had a PCN reaction causing severe rash involving mucus membranes or skin necrosis: No Has patient had a PCN reaction that required hospitalization No Has patient had a PCN reaction occurring within the last 10 years: No If all of the above answers are "NO", then may proceed with Cephalosporin use.  . Metoclopramide Other (See Comments)    Reaction:  Nightmares      Review of Systems: All systems reviewed and negative except where noted in HPI.   Lab Results  Component Value Date   WBC 8.5 04/21/2019   HGB 12.5 04/21/2019   HCT 38.2 04/21/2019   MCV 84.9 04/21/2019   PLT 324 04/21/2019    Lab Results  Component Value Date   CREATININE  0.77 04/21/2019   BUN 14 04/21/2019   NA 136 04/21/2019   K 4.4 04/21/2019   CL 99 04/21/2019   CO2 25 04/21/2019    Lab Results  Component Value Date   ALT 24 04/21/2019   AST 26 04/21/2019   ALKPHOS 131 (H) 04/21/2019   BILITOT 0.5 04/21/2019     Physical Exam: BP (!) 124/60   Pulse 73   Ht 5\' 4"  (1.626 m)   Wt (!) 238 lb (108 kg)   BMI 40.85 kg/m  Constitutional: Pleasant,well-developed, female in no acute distress.. Neurological: Alert and oriented to person place and time. Skin: Skin is warm and dry. No rashes noted. Psychiatric: Normal mood and affect. Behavior is normal.   ASSESSMENT AND PLAN: 54 year old female here for reassessment the following:  Gastroparesis / GERD - as above, doing pretty well and has not been using much of any Reglan recently.  She tolerates low-dose intermittently, will refill Reglan 5 mg to use every 8 hours as needed, she only plans on using this if severe symptoms.  She was previously on domperidone did well but stopped it due to cost, will hold off on that due to that issue.  We will continue PPI for her reflux.  We discussed options, she would really prefer to use Prevacid Solutab's if we can get her on that, 30 mg a day, will see if her insurance will cover this.  She can follow-up with me as needed for this issue.  Fatty liver / morbid obesity - I have been following her for  this for the past few years, she has been working on weight loss and doing a good job recently, encouraged her to keep up the good work and continue to work on her weight loss plan as best she can.  We discussed spectrum of fatty liver, risk for Elita Boone and cirrhosis.  She reports she had a FibroScan done recently and will get that report.  Her liver enzymes are normal, I will see her once a year for this.  History of colon polyps - reportedly had a colonoscopy within the past year done at [facility], will reach out to them to get her report and clarify when she is due for  surveillance.  She agreed  Ileene Patrick, MD [facility]

## 2019-12-19 NOTE — Patient Instructions (Addendum)
If you are age 53 or older, your body mass index should be between 23-30. Your Body mass index is 40.85 kg/m. If this is out of the aforementioned range listed, please consider follow up with your Primary Care Provider.  If you are age 7 or younger, your body mass index should be between 19-25. Your Body mass index is 40.85 kg/m. If this is out of the aformentioned range listed, please consider follow up with your Primary Care Provider.   We have sent the following medications to your pharmacy for you to pick up at your convenience: Reglan 5 mg: Take every 8 hours as needed Prevacid Solutab 30mg : Take once a day  We will request your records from [facility].  Thank you for entrusting me with your care and for choosing [facility], Dr. [facility]

## 2019-12-21 NOTE — Telephone Encounter (Signed)
Prior Auth for Prevacid Solutab 30 mg approved via CoverMyMeds. Member ID Number: [id number] Medicare Part D Silver[employer] Choice . Key: [id number] - [place] Case ID: [id number]  From  09/22/2019 - 12/20/2020

## 2019-12-22 ENCOUNTER — Telehealth: Payer: Self-pay

## 2019-12-22 NOTE — Telephone Encounter (Signed)
PA submitted via CoverMyMeds for Prevacid solutab 30mg , once daily.  Approved  09-22-2019 through 12-20-2020 per [facility] Part D  Per chart review, patient has tried zegerid, Nexium, Pepcid 40 mg, Zantac 150 mg, Pantoprazole 40 mg and 20 mg in the past and has failed each of these. The only medication that has been effective in the past has been the Prevacid Solutab.   Phone number for patient's insurance is [phone]. Member ID: [id number]. Patient Diagnosis: Esophageal stenosis

## 2019-12-25 NOTE — Telephone Encounter (Signed)
Patient called states the medication is not approved please advise

## 2019-12-25 NOTE — Telephone Encounter (Signed)
Called and spoke to pharmacy. Lansoprazole generic will not go thorough at covered. They tried the Brand name Prevacid solutabs and it went through.  She doesn't have it in stock so will order it and let the patient know.

## 2020-01-01 ENCOUNTER — Other Ambulatory Visit: Payer: Self-pay | Admitting: Gastroenterology

## 2020-01-13 ENCOUNTER — Telehealth: Payer: Self-pay | Admitting: Gastroenterology

## 2020-01-13 NOTE — Telephone Encounter (Signed)
[place] results arrived - 02/13/19 - enlarged liver, fatty liver, normal CBD, normal pancreas - no obvious cirrhosis  Will continue to see patient as outlined in clinic note

## 2020-02-04 ENCOUNTER — Telehealth: Payer: Self-pay | Admitting: Gastroenterology

## 2020-02-04 ENCOUNTER — Other Ambulatory Visit: Payer: Self-pay | Admitting: Family Medicine

## 2020-02-04 MED ORDER — METOPROLOL TARTRATE 25 MG PO TABS: 12.5000 mg | ORAL_TABLET | Freq: Every day | ORAL | 0 refills | Status: DC

## 2020-02-04 NOTE — Telephone Encounter (Signed)
Records arrived from prior evaluation.  EGD 10/30/19 - Dr. Gwendalyn Ege - LA class A esophagitis, benign stricture at the GEJ dilated to 1mm  Colonoscopy 09/22/19 - Dr. Gwendalyn Ege - adequate prep - 4 small polyps, 3 of which were adenomas - repeat in 3 years   Jan can you please place a recall for repeat colonoscopy in 09/2022 for this patient. Thanks

## 2020-02-04 NOTE — Telephone Encounter (Signed)
     1. Which medications need to be refilled? (please list name of each medication and dose if known) METOPROLOL 25 MG   2. Which pharmacy/location (including street and city if local pharmacy) is medication to be sent to?  [facility] [place]   3. Do they need a 30 day or 90 day supply? 45

## 2020-02-05 NOTE — Telephone Encounter (Signed)
Recall colon placed for 09-2022

## 2020-02-19 ENCOUNTER — Other Ambulatory Visit: Payer: Self-pay | Admitting: Gastroenterology

## 2020-02-19 NOTE — Progress Notes (Deleted)
Cardiology Office Note  Date: 02/19/2020   ID: Kim, [name] June 29, 1965, MRN [id number]  PCP:  Frederich Chick., MD  Cardiologist:  No primary care provider on file. Electrophysiologist:  None   Chief Complaint: CAD  History of Present Illness: [name] is a 54 y.o. female with a history of CAD status post CABG, HTN, HLD, DM 2, NAFLD.  History of CABG.  Cardiac catheterization 2014 bypass grafts were patent.  History of left-sided chest wall muscle spasm responding to muscle relaxant.  Echocardiogram 06/14/2017 normal LV systolic and diastolic function.  EF 60 to 65%.  Hospitalized for Covid pneumonia in November 2020.  Last encounter with Dr. Purvis Sheffield 05/30/2019 via telemedicine visit.  She had a residual cough and exertional dyspnea.  She had modified her diet and had been exercising.  She had lost 15 pounds.  CAD was symptomatically stable.  She was continuing aspirin.  She was taking metoprolol 12.5 once daily only because taking it twice exacerbated her asthma..  She was continuing rosuvastatin 10 mg daily for hyperlipidemia.  Blood pressure was controlled and there were no changes.  She remained on insulin using an insulin pump.  Past Medical History:  Diagnosis Date   Allergic rhinitis    Allergy    Anxiety    Asthma    Blood transfusion without reported diagnosis    2010 CABG   CAD (coronary artery disease)    4v CABG, 9/10; NL LVF, status post followup Cardiolite November 2012 no ischemia ejection fraction 65%   Cataract    are forming   Diabetes mellitus    Scheduled to go on insulin pump   Dyslipidemia     LDL 22 mg percent on Crestor   Ejection fraction    Esophageal stricture    Fatty liver    Gastroparesis due to DM (HCC)    GERD (gastroesophageal reflux disease)    Glaucoma    History of heart attack    Hx of CABG    September, 2010   Hyperlipidemia    Hypertension    Hypothyroidism    Left-sided chest wall  pain    Myocardial infarction (HCC) 2010   2008 x 2 stenst, 209 x 2 stents. 01-2009 CABG x 4     Past Surgical History:  Procedure Laterality Date   ANGIOPLASTY     with stenting 2008,2009   CARDIAC SURGERY     CESAREAN SECTION     CORONARY ARTERY BYPASS GRAFT  01/26/2009   EYE SURGERY     open heart surgery   2010   TUBAL LIGATION      Current Outpatient Medications  Medication Sig Dispense Refill   albuterol (VENTOLIN HFA) 108 (90 Base) MCG/ACT inhaler Inhale 2 puffs into the lungs every 6 (six) hours as needed for wheezing or shortness of breath. 8 g 1   ALPRAZolam (XANAX) 0.25 MG tablet Take 0.25 mg by mouth 2 (two) times daily as needed for anxiety or sleep.   1   aspirin 81 MG chewable tablet Chew 81 mg by mouth daily.     furosemide (LASIX) 40 MG tablet TAKE 1 TABLET BY MOUTH EVERY DAY 30 tablet 0   Insulin Human (INSULIN PUMP) SOLN Inject into the skin See admin instructions. Pt uses up to 150 units of NOVOLOG daily VIA PUMP     levothyroxine (SYNTHROID) 125 MCG tablet Take 125 mcg by mouth every morning.      Magnesium Oxide 500  MG CAPS Take 1 capsule by mouth every morning.      metoCLOPramide (REGLAN) 5 MG tablet Take 1 tablet (5 mg total) by mouth every 8 (eight) hours as needed for nausea. 30 tablet 3   metoprolol tartrate (LOPRESSOR) 25 MG tablet Take 0.5 tablets (12.5 mg total) by mouth daily. 45 tablet 0   nitroGLYCERIN (NITROSTAT) 0.4 MG SL tablet Place 1 tablet (0.4 mg total) under the tongue every 5 (five) minutes as needed for chest pain. 25 tablet 3   NOVOLOG 100 UNIT/ML injection Inject 100-150 Units into the skin as directed. Use up to 100-150 units daily in insulin pump as needed  5   omeprazole (PRILOSEC) 40 MG capsule OPEN 1 CAPSULE (40 MG) AND INGEST CONTENTS ONCE TO TWICE DAILY 60 capsule 1   oxyCODONE-acetaminophen (PERCOCET/ROXICET) 5-325 MG tablet Take 1 tablet by mouth 3 (three) times daily.   0   potassium chloride (K-DUR) 10 MEQ  tablet Take 1 tablet (10 mEq total) by mouth daily. 30 tablet 6   PREVACID SOLUTAB 30 MG disintegrating tablet TAKE 1 TABLET BY MOUTH EVERY DAY 90 tablet 1   Pseudoeph-Doxylamine-DM-APAP (NYQUIL PO) Take 30 mLs by mouth at bedtime as needed (for cough).     rosuvastatin (CRESTOR) 5 MG tablet Take 1 tablet (5 mg total) by mouth daily. (Patient taking differently: Take 10 mg by mouth daily. ) 30 tablet 6   TRELEGY ELLIPTA 100-62.5-25 MCG/INH AEPB Inhale 1 puff into the lungs daily.     vitamin C (ASCORBIC ACID) 500 MG tablet Take 500 mg by mouth daily.     Vitamin D, Ergocalciferol, (DRISDOL) 1.25 MG (50000 UT) CAPS capsule Take 50,000 Units by mouth every Monday.      zinc sulfate 220 (50 Zn) MG capsule Take 1 capsule (220 mg total) by mouth daily. 10 capsule 0   No current facility-administered medications for this visit.   Allergies:  Metformin, Penicillins, and Metoclopramide   Social History: The patient  reports that she quit smoking about 26 years ago. Her smoking use included cigarettes. She started smoking about 28 years ago. She has a 3.00 pack-year smoking history. She has never used smokeless tobacco. She reports that she does not drink alcohol and does not use drugs.   Family History: The patient's family history includes Asthma in her father, paternal aunt, and son; COPD in her father; Diabetes in her mother; Heart disease in her father.   ROS:  Please see the history of present illness. Otherwise, complete review of systems is positive for {NONE DEFAULTED:18576::"none"}.  All other systems are reviewed and negative.   Physical Exam: VS:  There were no vitals taken for this visit., BMI There is no height or weight on file to calculate BMI.  Wt Readings from Last 3 Encounters:  12/19/19 (!) 238 lb (108 kg)  11/28/19 240 lb 8 oz (109.1 kg)  05/30/19 235 lb (106.6 kg)    General: Patient appears comfortable at rest. HEENT: Conjunctiva and lids normal, oropharynx clear with  moist mucosa. Neck: Supple, no elevated JVP or carotid bruits, no thyromegaly. Lungs: Clear to auscultation, nonlabored breathing at rest. Cardiac: Regular rate and rhythm, no S3 or significant systolic murmur, no pericardial rub. Abdomen: Soft, nontender, no hepatomegaly, bowel sounds present, no guarding or rebound. Extremities: No pitting edema, distal pulses 2+. Skin: Warm and dry. Musculoskeletal: No kyphosis. Neuropsychiatric: Alert and oriented x3, affect grossly appropriate.  ECG:  {EKG/Telemetry Strips [phone]}  Recent Labwork: 04/18/2019: Magnesium 1.9 04/21/2019:  ALT 24; AST 26; BUN 14; Creatinine, Ser 0.77; Hemoglobin 12.5; Platelets 324; Potassium 4.4; Sodium 136     Component Value Date/Time   CHOL  01/24/2009 0400    185        ATP III CLASSIFICATION:  <200     mg/dL   Desirable  202-542  mg/dL   Borderline High  >=706    mg/dL   High          TRIG 237 (H) 04/16/2019 1143   HDL 34 (L) 01/24/2009 0400   CHOLHDL 5.4 01/24/2009 0400   VLDL 36 01/24/2009 0400   LDLCALC (H) 01/24/2009 0400    115        Total Cholesterol/HDL:CHD Risk Coronary Heart Disease Risk Table                     Men   Women  1/2 Average Risk   3.4   3.3  Average Risk       5.0   4.4  2 X Average Risk   9.6   7.1  3 X Average Risk  23.4   11.0        Use the calculated Patient Ratio above and the CHD Risk Table to determine the patient's CHD Risk.        ATP III CLASSIFICATION (LDL):  <100     mg/dL   Optimal  628-315  mg/dL   Near or Above                    Optimal  130-159  mg/dL   Borderline  176-160  mg/dL   High  >737     mg/dL   Very High    Other Studies Reviewed Today:   Assessment and Plan:  1. CAD in native artery   2. Essential hypertension   3. Mixed hyperlipidemia   4. Diabetes mellitus type 2, insulin dependent (HCC)    1. CAD in native artery ***  2. Essential hypertension ***  3. Mixed hyperlipidemia ***  4. Diabetes mellitus type 2,  insulin dependent (HCC) ***  Medication Adjustments/Labs and Tests Ordered: Current medicines are reviewed at length with the patient today.  Concerns regarding medicines are outlined above.   Disposition: Follow-up with ***  Signed, Rennis Harding, NP 02/19/2020 11:20 PM    [facility] [address] [place], [place], [place] [address] Phone: [phone]; Fax: [phone]

## 2020-02-20 ENCOUNTER — Ambulatory Visit: Payer: Medicare Other | Admitting: Family Medicine

## 2020-02-23 ENCOUNTER — Other Ambulatory Visit: Payer: Self-pay | Admitting: Gastroenterology

## 2020-03-08 ENCOUNTER — Other Ambulatory Visit: Payer: Self-pay

## 2020-03-08 ENCOUNTER — Ambulatory Visit (INDEPENDENT_AMBULATORY_CARE_PROVIDER_SITE_OTHER): Payer: Medicare Other | Admitting: Podiatry

## 2020-03-08 ENCOUNTER — Ambulatory Visit (INDEPENDENT_AMBULATORY_CARE_PROVIDER_SITE_OTHER): Payer: Medicare Other

## 2020-03-08 DIAGNOSIS — S86111A Strain of other muscle(s) and tendon(s) of posterior muscle group at lower leg level, right leg, initial encounter: Secondary | ICD-10-CM

## 2020-03-08 DIAGNOSIS — M19071 Primary osteoarthritis, right ankle and foot: Secondary | ICD-10-CM | POA: Diagnosis not present

## 2020-03-08 DIAGNOSIS — S9001XA Contusion of right ankle, initial encounter: Secondary | ICD-10-CM

## 2020-03-08 NOTE — Progress Notes (Signed)
HPI: 54 y.o. female presenting today as a new patient referral from Dr. Elijah Birk, local podiatrist for evaluation of right ankle pain has been going on for approximately 2 months now.  Patient states that several years ago, approximately 3-4 years ago, she remembers stepping in a hole and injuring her ankle.  At that time she healed uneventfully and did not seek any medical treatment at that time.  Again approximately 6-8 weeks ago she stepped another hole again and has been experiencing ankle pain ever since.  She has been placed in a cam boot and receive steroidal injections and oral anti-inflammatories with minimal relief.  As soon as the patient gets out of the boot she begins to experience significant pain to the right ankle medial aspect.  She presents for further treatment evaluation  Past Medical History:  Diagnosis Date  . Allergic rhinitis   . Allergy   . Anxiety   . Asthma   . Blood transfusion without reported diagnosis    2010 CABG  . CAD (coronary artery disease)    4v CABG, 9/10; NL LVF, status post followup Cardiolite November 2012 no ischemia ejection fraction 65%  . Cataract    are forming  . Diabetes mellitus    Scheduled to go on insulin pump  . Dyslipidemia     LDL 22 mg percent on Crestor  . Ejection fraction   . Esophageal stricture   . Fatty liver   . Gastroparesis due to DM (HCC)   . GERD (gastroesophageal reflux disease)   . Glaucoma   . History of heart attack   . Hx of CABG    September, 2010  . Hyperlipidemia   . Hypertension   . Hypothyroidism   . Left-sided chest wall pain   . Myocardial infarction (HCC) 2010   2008 x 2 stenst, 209 x 2 stents. 01-2009 CABG x 4      Physical Exam: General: The patient is alert and oriented x3 in no acute distress.  Dermatology: Skin is warm, dry and supple bilateral lower extremities. Negative for open lesions or macerations.  Vascular: Palpable pedal pulses bilaterally.  Diffuse edema noted throughout the right  ankle capillary refill within normal limits.  Neurological: Epicritic and protective threshold grossly intact bilaterally.   Musculoskeletal Exam: Range of motion within normal limits to all pedal and ankle joints bilateral. Muscle strength 5/5 in all groups bilateral.  Tenderness to palpation along the posterior tibial tendon just posterior to the medial malleolus.  Radiographic Exam:  Normal osseous mineralization. Joint spaces preserved.  It appears that there is an old fracture fragmentation of the medial malleolus noted.  Diffuse joint space narrowing with moderate DJD noted left ankle  Assessment: 1.  Old fracture medial malleolus right 2.  Suspect traumatic posterior tibial tendon tear right   Plan of Care:  1. Patient evaluated. X-Rays reviewed.  2.  Explained to the patient that she is now had this injury for approximately 2 months now with minimal relief.  We need to order an MRI right ankle without contrast to rule out posterior tibial tendon tear versus the old fracture fragmentation of the medial malleolus 3.  Continue cam boot as needed 4.  Continue meloxicam as needed 5.  Return to clinic after MRI to review results and discuss different treatment options  *On permanent disability since 2010 secondary to DM type I, CAD       Felecia Shelling, DPM [facility]  Dr. Larena Glassman.  Amalia Hailey, DPM    [address], [place] [address]                Office [phone]  Fax [phone]

## 2020-03-21 ENCOUNTER — Ambulatory Visit
Admission: RE | Admit: 2020-03-21 | Discharge: 2020-03-21 | Disposition: A | Discharge status: Home / Self Care | Payer: Medicare Other | Source: Ambulatory Visit | Attending: Podiatry | Admitting: Podiatry

## 2020-03-21 ENCOUNTER — Other Ambulatory Visit: Payer: Self-pay

## 2020-03-21 DIAGNOSIS — S86111A Strain of other muscle(s) and tendon(s) of posterior muscle group at lower leg level, right leg, initial encounter: Secondary | ICD-10-CM

## 2020-03-21 DIAGNOSIS — M19071 Primary osteoarthritis, right ankle and foot: Secondary | ICD-10-CM

## 2020-04-22 ENCOUNTER — Encounter: Payer: Self-pay | Admitting: Cardiology

## 2020-04-22 ENCOUNTER — Encounter: Payer: Self-pay | Admitting: *Deleted

## 2020-04-22 NOTE — Progress Notes (Signed)
Cardiology Office Note   Date:  04/23/2020   ID:  [name], [name], [name], MRN [id number]  PCP:  Frederich Chick., MD  Cardiologist:   Rollene Rotunda, MD   Chief Complaint  Patient presents with   Chest Pain      History of Present Illness: [name] is a 54 y.o. female who presents for follow up of coronary artery disease and history of CABG. She underwent coronary angiography in 2014 and all of her bypass grafts were patent at that time. Most recent echocardiogram performed on 06/14/2017 showed normal left ventricular systolic and diastolic function, LVEF 60 to 00%.    The patient presents for evaluation. Her blood sugars still poorly controlled. I see a hemoglobin A1c of 9.4. She was recently started on Victoza. She has had weight gain because she is not been able to exercise. She has ankle pain and that sounds like she is going to need surgery. She has been describing some chest burning. This happens when she walks with a fast pace. It is in her left upper chest. It lasts for a few minutes after she stops exercising. Is reminiscent of her previous angina. She has not taken any nitroglycerin. She does not have any new shortness of breath, PND or orthopnea. She has no new palpitations, presyncope or syncope.   Past Medical History:  Diagnosis Date   Anxiety    Asthma    Blood transfusion without reported diagnosis    2010 CABG   CAD (coronary artery disease)    4v CABG, 9/10; NL LVF, status post followup Cardiolite November 2012 no ischemia ejection fraction 65%   Cataract    are forming   Diabetes mellitus    Dyslipidemia     LDL 22 mg percent on Crestor   Esophageal stricture    Fatty liver    Gastroparesis due to DM (HCC)    GERD (gastroesophageal reflux disease)    Glaucoma    Hyperlipidemia    Hypertension    Hypothyroidism    Left-sided chest wall pain    Myocardial infarction (HCC) 2010   2008 x 2 stents, 2009 x 2 stents.  01-2009 CABG x 4     Past Surgical History:  Procedure Laterality Date   ANGIOPLASTY     with stenting 2008,2009   CARDIAC SURGERY     CESAREAN SECTION     CORONARY ARTERY BYPASS GRAFT  01/26/2009   EYE SURGERY     open heart surgery   2010   TUBAL LIGATION       Current Outpatient Medications  Medication Sig Dispense Refill   albuterol (VENTOLIN HFA) 108 (90 Base) MCG/ACT inhaler Inhale 2 puffs into the lungs every 6 (six) hours as needed for wheezing or shortness of breath. 8 g 1   ALPRAZolam (XANAX) 0.25 MG tablet Take 0.25 mg by mouth 2 (two) times daily as needed for anxiety or sleep.   1   aspirin 81 MG chewable tablet Chew 81 mg by mouth daily.     furosemide (LASIX) 40 MG tablet TAKE 1 TABLET BY MOUTH EVERY DAY 30 tablet 0   Insulin Human (INSULIN PUMP) SOLN Inject into the skin See admin instructions. Pt uses up to 150 units of NOVOLOG daily VIA PUMP     levothyroxine (SYNTHROID) 125 MCG tablet Take 125 mcg by mouth every morning.      liraglutide (VICTOZA) 18 MG/3ML SOPN Inject 1.8 mg into the skin daily.  Magnesium Oxide 500 MG CAPS Take 1 capsule by mouth every morning.      metoCLOPramide (REGLAN) 5 MG tablet Take 1 tablet (5 mg total) by mouth every 8 (eight) hours as needed for nausea. 30 tablet 3   metoprolol tartrate (LOPRESSOR) 25 MG tablet Take 0.5 tablets (12.5 mg total) by mouth daily. 45 tablet 3   nitroGLYCERIN (NITROSTAT) 0.4 MG SL tablet Place 1 tablet (0.4 mg total) under the tongue every 5 (five) minutes x 3 doses as needed for chest pain (if no relief after 2nd dose, proceed to the ED for an evaluation or call 911). 25 tablet 3   NOVOLOG 100 UNIT/ML injection Inject 100-150 Units into the skin as directed. Use up to 100-150 units daily in insulin pump as needed  5   omeprazole (PRILOSEC) 40 MG capsule OPEN 1 CAPSULE (40 MG) AND INGEST CONTENTS ONCE TO TWICE DAILY 60 capsule 1   oxyCODONE-acetaminophen (PERCOCET/ROXICET) 5-325 MG tablet  Take 1 tablet by mouth 3 (three) times daily.   0   potassium chloride (K-DUR) 10 MEQ tablet Take 1 tablet (10 mEq total) by mouth daily. 30 tablet 6   PREVACID SOLUTAB 30 MG disintegrating tablet TAKE 1 TABLET BY MOUTH EVERY DAY 30 tablet 5   Pseudoeph-Doxylamine-DM-APAP (NYQUIL PO) Take 30 mLs by mouth at bedtime as needed (for cough).     rosuvastatin (CRESTOR) 5 MG tablet Take 1 tablet (5 mg total) by mouth daily. (Patient taking differently: Take 10 mg by mouth daily. ) 30 tablet 6   vitamin C (ASCORBIC ACID) 500 MG tablet Take 500 mg by mouth daily.     zinc sulfate 220 (50 Zn) MG capsule Take 1 capsule (220 mg total) by mouth daily. 10 capsule 0   No current facility-administered medications for this visit.    Allergies:   Metformin, Penicillins, and Metoclopramide    ROS:  Please see the history of present illness.   Otherwise, review of systems are positive for none.   All other systems are reviewed and negative.    PHYSICAL EXAM: VS:  BP (!) 150/70    Pulse 72    Ht 5\' 4"  (1.626 m)    Wt 249 lb (112.9 kg)    SpO2 97%    BMI 42.74 kg/m  , BMI Body mass index is 42.74 kg/m. GENERAL:  Well appearing NECK:  No jugular venous distention, waveform within normal limits, carotid upstroke brisk and symmetric, no bruits, no thyromegaly LUNGS:  Clear to auscultation bilaterally CHEST:  Well healed sternotomy scar. HEART:  PMI not displaced or sustained,S1 and S2 within normal limits, no S3, no S4, no clicks, no rubs, no murmurs ABD:  Flat, positive bowel sounds normal in frequency in pitch, no bruits, no rebound, no guarding, no midline pulsatile mass, no hepatomegaly, no splenomegaly EXT:  2 plus pulses throughout, no edema, no cyanosis no clubbing   EKG:  EKG is ordered today. The ekg ordered today demonstrates sinus rhythm, rate 72, axis within normal limits, intervals within normal limits, no acute ST-T wave changes.   Recent Labs: No results found for requested labs  within last 8760 hours.   Other studies Reviewed: Additional studies/ records that were reviewed today include: Labs. Review of the above records demonstrates:  Please see elsewhere in the note.     ASSESSMENT AND PLAN:  CAD:   The patient is having some burning chest discomfort and have significant uncontrolled risk factors. She needs stress testing but would not  be able walk on a treadmill. Therefore, she will have a [employer].  HTN: Her blood pressure is mildly elevated today but this is unusual and I reviewed recent readings. No change in therapy.  DYSLIPIDEMIA: She needs a fasting lipid profile. She can't really tolerate much more in the way of statins. She can't swallow anything and has to crush most of her pills. She would be a good candidate for PCSK9 if her LDL is not less than 70.  DM: She was recently started on Victoza which I think is a good move. We talked about diet. She is followed by an endocrinologist.  Current medicines are reviewed at length with the patient today.  The patient does not have concerns regarding medicines.  The following changes have been made:  no change  Labs/ tests ordered today include:   Orders Placed This Encounter  Procedures   NM Myocar Multi W/Spect W/Wall Motion / EF   Lipid panel   EKG 12-Lead     Disposition:   FU with me in six months in [place], Rollene Rotunda, MD  04/23/2020 3:58 PM    [facility]

## 2020-04-23 ENCOUNTER — Encounter: Payer: Self-pay | Admitting: *Deleted

## 2020-04-23 ENCOUNTER — Ambulatory Visit (INDEPENDENT_AMBULATORY_CARE_PROVIDER_SITE_OTHER): Payer: Medicare Other | Admitting: Cardiology

## 2020-04-23 ENCOUNTER — Encounter: Payer: Self-pay | Admitting: Cardiology

## 2020-04-23 ENCOUNTER — Other Ambulatory Visit: Payer: Self-pay | Admitting: *Deleted

## 2020-04-23 VITALS — BP 150/70 | HR 72 | Ht 64.0 in | Wt 249.0 lb

## 2020-04-23 DIAGNOSIS — E785 Hyperlipidemia, unspecified: Secondary | ICD-10-CM | POA: Diagnosis not present

## 2020-04-23 DIAGNOSIS — I25708 Atherosclerosis of coronary artery bypass graft(s), unspecified, with other forms of angina pectoris: Secondary | ICD-10-CM

## 2020-04-23 DIAGNOSIS — I10 Essential (primary) hypertension: Secondary | ICD-10-CM | POA: Diagnosis not present

## 2020-04-23 DIAGNOSIS — R079 Chest pain, unspecified: Secondary | ICD-10-CM | POA: Diagnosis not present

## 2020-04-23 MED ORDER — METOPROLOL TARTRATE 25 MG PO TABS
12.5000 mg | ORAL_TABLET | Freq: Every day | ORAL | 3 refills | Status: DC
Start: 2020-04-23 — End: 2021-06-29

## 2020-04-23 MED ORDER — NITROGLYCERIN 0.4 MG SL SUBL: 0.4000 mg | SUBLINGUAL_TABLET | SUBLINGUAL | 3 refills | Status: DC | PRN

## 2020-04-23 NOTE — Patient Instructions (Addendum)
Medication Instructions:   Your physician recommends that you continue on your current medications as directed. Please refer to the Current Medication list given to you today.  Labwork: Your physician recommends that you return for a FASTING lipid profile: Please do not eat or drink for at least 8 hours when you have this done. You may take your medications that morning with a sip of water.  Please have this done at [facility] on the same day as your stress test.  Testing/Procedures: Your physician has requested that you have a lexiscan myoview. For further information please visit [email or web address] Please follow instruction sheet, as given.  Follow-Up:  Your physician recommends that you schedule a follow-up appointment in: 6 months in [place].  Any Other Special Instructions Will Be Listed Below (If Applicable).  If you need a refill on your cardiac medications before your next appointment, please call your pharmacy.

## 2020-04-27 ENCOUNTER — Ambulatory Visit (HOSPITAL_COMMUNITY)
Admission: RE | Admit: 2020-04-27 | Discharge: 2020-04-27 | Disposition: A | Discharge status: Home / Self Care | Payer: Medicare Other | Source: Ambulatory Visit | Attending: Cardiology | Admitting: Cardiology

## 2020-04-27 ENCOUNTER — Encounter (HOSPITAL_COMMUNITY): Payer: Self-pay

## 2020-04-27 ENCOUNTER — Other Ambulatory Visit: Payer: Self-pay

## 2020-04-27 ENCOUNTER — Encounter (HOSPITAL_COMMUNITY)
Admission: RE | Admit: 2020-04-27 | Discharge: 2020-04-27 | Disposition: A | Discharge status: Home / Self Care | Payer: Medicare Other | Source: Ambulatory Visit | Attending: Cardiology | Admitting: Cardiology

## 2020-04-27 DIAGNOSIS — R079 Chest pain, unspecified: Secondary | ICD-10-CM | POA: Insufficient documentation

## 2020-04-27 LAB — NM MYOCAR MULTI W/SPECT W/WALL MOTION / EF
LV dias vol: 80 mL (ref 46–106)
LV sys vol: 22 mL
Peak HR: 113 {beats}/min
RATE: 0.55
Rest HR: 73 {beats}/min
SDS: 1
SRS: 1
SSS: 2
TID: 1.22

## 2020-04-27 MED ORDER — TECHNETIUM TC 99M TETROFOSMIN IV KIT
10.0000 | PACK | Freq: Once | INTRAVENOUS | Status: AC | PRN
  Administered 2020-04-27: 10.7 via INTRAVENOUS

## 2020-04-27 MED ORDER — TECHNETIUM TC 99M TETROFOSMIN IV KIT
30.0000 | PACK | Freq: Once | INTRAVENOUS | Status: AC | PRN
  Administered 2020-04-27: 32 via INTRAVENOUS

## 2020-04-27 MED ORDER — SODIUM CHLORIDE FLUSH 0.9 % IV SOLN
INTRAVENOUS | Status: AC
  Administered 2020-04-27: 10 mL via INTRAVENOUS
  Filled 2020-04-27: qty 10

## 2020-04-27 MED ORDER — REGADENOSON 0.4 MG/5ML IV SOLN
INTRAVENOUS | Status: AC
  Administered 2020-04-27: 0.4 mg via INTRAVENOUS
  Filled 2020-04-27: qty 5

## 2020-06-24 ENCOUNTER — Other Ambulatory Visit: Payer: Self-pay | Admitting: Gastroenterology

## 2020-06-25 ENCOUNTER — Other Ambulatory Visit: Payer: Self-pay | Admitting: Gastroenterology

## 2020-09-09 ENCOUNTER — Other Ambulatory Visit: Payer: Self-pay | Admitting: Gastroenterology

## 2020-10-10 ENCOUNTER — Emergency Department (HOSPITAL_COMMUNITY): Payer: Medicare Other

## 2020-10-10 ENCOUNTER — Encounter (HOSPITAL_COMMUNITY): Payer: Self-pay

## 2020-10-10 ENCOUNTER — Other Ambulatory Visit: Payer: Self-pay

## 2020-10-10 ENCOUNTER — Emergency Department (HOSPITAL_COMMUNITY)
Admission: EM | Admit: 2020-10-10 | Discharge: 2020-10-10 | Disposition: A | Discharge status: Home / Self Care | Payer: Medicare Other | Attending: Emergency Medicine | Admitting: Emergency Medicine

## 2020-10-10 DIAGNOSIS — Z87891 Personal history of nicotine dependence: Secondary | ICD-10-CM | POA: Insufficient documentation

## 2020-10-10 DIAGNOSIS — Z8616 Personal history of COVID-19: Secondary | ICD-10-CM | POA: Insufficient documentation

## 2020-10-10 DIAGNOSIS — E119 Type 2 diabetes mellitus without complications: Secondary | ICD-10-CM | POA: Diagnosis not present

## 2020-10-10 DIAGNOSIS — Z7982 Long term (current) use of aspirin: Secondary | ICD-10-CM | POA: Diagnosis not present

## 2020-10-10 DIAGNOSIS — Z79899 Other long term (current) drug therapy: Secondary | ICD-10-CM | POA: Insufficient documentation

## 2020-10-10 DIAGNOSIS — J45909 Unspecified asthma, uncomplicated: Secondary | ICD-10-CM | POA: Diagnosis not present

## 2020-10-10 DIAGNOSIS — I10 Essential (primary) hypertension: Secondary | ICD-10-CM | POA: Diagnosis not present

## 2020-10-10 DIAGNOSIS — I252 Old myocardial infarction: Secondary | ICD-10-CM | POA: Diagnosis not present

## 2020-10-10 DIAGNOSIS — E039 Hypothyroidism, unspecified: Secondary | ICD-10-CM | POA: Diagnosis not present

## 2020-10-10 DIAGNOSIS — Z794 Long term (current) use of insulin: Secondary | ICD-10-CM | POA: Diagnosis not present

## 2020-10-10 DIAGNOSIS — Z955 Presence of coronary angioplasty implant and graft: Secondary | ICD-10-CM | POA: Diagnosis not present

## 2020-10-10 DIAGNOSIS — I2510 Atherosclerotic heart disease of native coronary artery without angina pectoris: Secondary | ICD-10-CM | POA: Insufficient documentation

## 2020-10-10 DIAGNOSIS — M25572 Pain in left ankle and joints of left foot: Secondary | ICD-10-CM

## 2020-10-10 MED ORDER — OXYCODONE-ACETAMINOPHEN 5-325 MG PO TABS
1.0000 | ORAL_TABLET | Freq: Once | ORAL | Status: AC
  Administered 2020-10-10: 1 via ORAL
  Filled 2020-10-10: qty 1

## 2020-10-10 MED ORDER — INDOMETHACIN 25 MG PO CAPS
50.0000 mg | ORAL_CAPSULE | Freq: Once | ORAL | Status: AC
  Administered 2020-10-10: 50 mg via ORAL
  Filled 2020-10-10: qty 2

## 2020-10-10 MED ORDER — INDOMETHACIN 50 MG PO CAPS: 50.0000 mg | ORAL_CAPSULE | Freq: Two times a day (BID) | ORAL | 0 refills | Status: DC

## 2020-10-10 MED ORDER — OXYCODONE-ACETAMINOPHEN 5-325 MG PO TABS
1.0000 | ORAL_TABLET | ORAL | 0 refills | Status: DC | PRN
Start: 2020-10-10 — End: 2021-05-18

## 2020-10-10 NOTE — ED Triage Notes (Signed)
Patient complains of 1 week of left ankle swelling and pain with ambulation. Thinks she may have twisted. NAD

## 2020-10-10 NOTE — Discharge Instructions (Signed)
Use the crutches to be weight bearing as tolerated. Cool compresses to reduce the swelling.   Take medications as prescribed. Follow up with your orthopedist for further evaluation and management if symptoms persist.

## 2020-10-10 NOTE — ED Provider Notes (Signed)
Emergency Medicine Provider Triage Evaluation Note  [name] 55 y.o. [name] was evaluated in triage.  Pt complains of left ankle pain and swelling is been ongoing for about a week.  [name] states it has been worse and today had difficulty ambulating bearing weight on it.  [name] does not recall any specific injury but states [name] may have twisted it.  No fevers, warmth, erythema.  No numbness/weakness.   Review of Systems  Positive: Ankle pain, swelling Negative: Fever, numbness/weakness, warmth, erythema.   Physical Exam  BP 134/82   Pulse 70   Temp 98.2 F (36.8 C) (Oral)   Resp 18   Ht 5\' 4"  (1.626 m)   Wt 65.8 kg   SpO2 100%   BMI 24.89 kg/m  Gen:   Awake, no distress   HEENT:  Atraumatic  Resp:  Normal effort  Cardiac:  Normal rate. 2+ DP pulse bilatearlly. Abd:   Nondistended, nontender  MSK:   Moves extremities without difficulty  Neuro:  Speech clear   Other:   Diffuse tenderness noted to the medial malleolus of the left ankle with some mild overlying soft tissue swelling.  No overlying warmth, erythema.  [name] can wiggle all 5 toes without difficulty. Good distal cap refill. LLE is not dusky in appearance or cool to touch.  Medical Decision Making  Medically screening exam initiated at 11:19 AM  Appropriate orders placed.  [name] was informed that the remainder of the evaluation will be completed by another provider, this initial triage assessment does not replace that evaluation. They are counseled that they will need to remain in the ED until the completion of their workup, including full H&P and results of any tests.  Risks of leaving the emergency department prior to completion of treatment were discussed. Patient was advised to inform ED staff if they are leaving before their treatment is complete. The patient acknowledged these risks and time was allowed for questions.     The patient appears stable so that the remainder of the MSE may be completed by another  provider.   Clinical Impression  Ankle pain   Portions of this note were generated with Dragon dictation software. Dictation errors may occur despite best attempts at proofreading.      Katrinka Blazing, PA-C 10/10/20 1120    10/12/20, MD 10/10/20 [phone]

## 2020-10-10 NOTE — ED Provider Notes (Signed)
[facility] EMERGENCY DEPARTMENT Provider Note   CSN: [id number] Arrival date & time: 10/10/20  1038     History No chief complaint on file.   [name] is a 55 y.o. female.  Patient with history of T2DM, CAD, HTN, HLD presents with atraumatic swollen, painful left ankle. No history of gout. No fever or calf pain. She reports it has been progressive over 2 weeks and is now causing difficulty ambulating. No prior injury to the ankle.   The history is provided by the patient. No language interpreter was used.       Past Medical History:  Diagnosis Date  . Anxiety   . Asthma   . Blood transfusion without reported diagnosis    2010 CABG  . CAD (coronary artery disease)    4v CABG, 9/10; NL LVF, status post followup Cardiolite November 2012 no ischemia ejection fraction 65%  . Cataract    are forming  . Diabetes mellitus   . Dyslipidemia     LDL 22 mg percent on Crestor  . Esophageal stricture   . Fatty liver   . Gastroparesis due to DM (HCC)   . GERD (gastroesophageal reflux disease)   . Glaucoma   . Hyperlipidemia   . Hypertension   . Hypothyroidism   . Left-sided chest wall pain   . Myocardial infarction [facility]) 2010   2008 x 2 stents, 2009 x 2 stents. 01-2009 CABG x 4     Patient Active Problem List   Diagnosis Date Noted  . Acute respiratory failure with hypoxia (HCC) 04/17/2019  . Uncontrolled type 1 diabetes mellitus with hypoglycemia without coma, with long-term current use of insulin (HCC) 04/17/2019  . COVID-19 virus infection   . Pneumonia due to COVID-19 virus 04/16/2019  . Acute bronchitis 07/18/2017  . Oral thrush 04/19/2017  . Chronic seasonal allergic rhinitis 03/19/2017  . Diabetes mellitus with complication (HCC)   . Atypical chest pain   . Muscle spasm 08/20/2014  . Obesity, unspecified 08/03/2013  . Hx of CABG   . Ejection fraction   . HTN (hypertension) 08/22/2012  . Hypothyroidism 09/29/2011  . Edema 08/25/2011  .  Gastroparesis 09/07/2010  . Hyperlipidemia 02/21/2010  . Muscle spasm of left shoulder 04/29/2009  . PALPITATIONS 04/12/2009  . WHEEZING 04/12/2009  . GERD 01/19/2009  . Chest pain 02/04/2008  . Diabetes mellitus type 2, insulin dependent (HCC) 06/12/2007  . Anxiety disorder 06/12/2007  . Coronary artery disease involving coronary bypass graft of native heart 06/12/2007  . Asthma in adult 06/12/2007    Past Surgical History:  Procedure Laterality Date  . ANGIOPLASTY     with stenting 8119,1478  . CARDIAC SURGERY    . CESAREAN SECTION    . CORONARY ARTERY BYPASS GRAFT  01/26/2009  . EYE SURGERY    . open heart surgery   2010  . TUBAL LIGATION       OB History    Gravida  1   Para  1   Term      Preterm  1   AB      Living  1     SAB      IAB      Ectopic      Multiple      Live Births  1           Family History  Problem Relation Age of Onset  . Heart disease Father   . Asthma Father   .  COPD Father   . Diabetes Mother   . Asthma Paternal Aunt   . Asthma Son   . Colon cancer Neg Hx   . Colon polyps Neg Hx   . Esophageal cancer Neg Hx   . Rectal cancer Neg Hx   . Stomach cancer Neg Hx     Social History   Tobacco Use  . Smoking status: Former Smoker    Packs/day: 1.00    Years: 3.00    Pack years: 3.00    Types: Cigarettes    Start date: 03/14/1991    Quit date: 05/22/1993    Years since quitting: 27.4  . Smokeless tobacco: Never Used  . Tobacco comment:  Year Quit: 1995  Vaping Use  . Vaping Use: Never used  Substance Use Topics  . Alcohol use: No    Alcohol/week: 0.0 standard drinks  . Drug use: No    Home Medications Prior to Admission medications   Medication Sig Start Date End Date Taking? Authorizing Provider  albuterol (VENTOLIN HFA) 108 (90 Base) MCG/ACT inhaler Inhale 2 puffs into the lungs every 6 (six) hours as needed for wheezing or shortness of breath. 04/21/19   Meredeth IdeLama, Gagan S, MD  ALPRAZolam Prudy Feeler(XANAX) 0.25 MG tablet  Take 0.25 mg by mouth 2 (two) times daily as needed for anxiety or sleep.  11/21/17   [provider]  aspirin 81 MG chewable tablet Chew 81 mg by mouth daily.    [provider]  furosemide (LASIX) 40 MG tablet TAKE 1 TABLET BY MOUTH EVERY DAY 11/17/19   Laqueta LindenKoneswaran, Suresh A, MD  Insulin Human (INSULIN PUMP) SOLN Inject into the skin See admin instructions. Pt uses up to 150 units of NOVOLOG daily VIA PUMP    [provider]  lansoprazole (PREVACID SOLUTAB) 30 MG disintegrating tablet TAKE 1 TABLET BY MOUTH EVERY DAY 06/25/20   Armbruster, Willaim RayasSteven P, MD  levothyroxine (SYNTHROID) 125 MCG tablet Take 125 mcg by mouth every morning.  02/21/19   [provider]  liraglutide (VICTOZA) 18 MG/3ML SOPN Inject 1.8 mg into the skin daily.    [provider]  Magnesium Oxide 500 MG CAPS Take 1 capsule by mouth every morning.  04/07/19   [provider]  metoCLOPramide (REGLAN) 5 MG tablet Take 1 tablet (5 mg total) by mouth every 8 (eight) hours as needed for nausea. 12/19/19   Armbruster, Willaim RayasSteven P, MD  metoprolol tartrate (LOPRESSOR) 25 MG tablet Take 0.5 tablets (12.5 mg total) by mouth daily. 04/23/20   Rollene RotundaHochrein, James, MD  nitroGLYCERIN (NITROSTAT) 0.4 MG SL tablet Place 1 tablet (0.4 mg total) under the tongue every 5 (five) minutes x 3 doses as needed for chest pain (if no relief after 2nd dose, proceed to the ED for an evaluation or call 911). 04/23/20   Rollene RotundaHochrein, James, MD  NOVOLOG 100 UNIT/ML injection Inject 100-150 Units into the skin as directed. Use up to 100-150 units daily in insulin pump as needed 11/23/17   [provider]  oxyCODONE-acetaminophen (PERCOCET/ROXICET) 5-325 MG tablet Take 1 tablet by mouth 3 (three) times daily.  11/23/17   [provider]  potassium chloride (K-DUR) 10 MEQ tablet Take 1 tablet (10 mEq total) by mouth daily. 06/14/17   Laqueta LindenKoneswaran, Suresh A, MD  Pseudoeph-Doxylamine-DM-APAP (NYQUIL PO) Take 30 mLs by mouth  at bedtime as needed (for cough).    [provider]  rosuvastatin (CRESTOR) 5 MG tablet Take 1 tablet (5 mg total) by mouth daily. Patient taking  differently: Take 10 mg by mouth daily.  07/22/18   Laqueta Linden, MD  vitamin C (ASCORBIC ACID) 500 MG tablet Take 500 mg by mouth daily.    [provider]  zinc sulfate 220 (50 Zn) MG capsule Take 1 capsule (220 mg total) by mouth daily. 04/22/19   Meredeth Ide, MD    Allergies    Metformin, Penicillins, and Metoclopramide  Review of Systems   Review of Systems  Constitutional: Negative for fever.  Musculoskeletal:       See HPI.  Skin: Negative for color change and wound.  Neurological: Negative for numbness.    Physical Exam Updated Vital Signs BP 140/67 (BP Location: Left Arm)   Pulse 73   Temp 97.7 F (36.5 C) (Oral)   Resp 18   SpO2 96%   Physical Exam Vitals and nursing note reviewed.  Constitutional:      Appearance: She is well-developed.  Pulmonary:     Effort: Pulmonary effort is normal.  Musculoskeletal:     Cervical back: Normal range of motion.     Comments: Left ankle swollen medially without color change. The joint is not warm to touch. Pulse distally intact. No calf tenderness.   Skin:    General: Skin is warm and dry.     Findings: No erythema.  Neurological:     Mental Status: She is alert and oriented to person, place, and time.     ED Results / Procedures / Treatments   Labs (all labs ordered are listed, but only abnormal results are displayed) Labs Reviewed - No data to display  EKG None  Radiology DG Ankle Complete Left  Result Date: 10/10/2020 CLINICAL DATA:  55 year old female with 1 week of ankle pain and swelling. EXAM: LEFT ANKLE COMPLETE - 3+ VIEW COMPARISON:  None. FINDINGS: Multifocal degenerative spurring about the ankle, and also involving the calcaneus. Chronic appearing posttraumatic or degenerative ossific fragments at the anterior talus and medial  malleolus. Possible small joint effusion. Medial soft tissue swelling at the ankle. No acute osseous abnormality identified. IMPRESSION: 1. Medial soft tissue swelling and possible ankle joint effusion. 2. Advanced posttraumatic and/or degenerative changes but no acute osseous abnormality identified. Electronically Signed   By: Odessa Fleming M.D.   On: 10/10/2020 11:47    Procedures Procedures   Medications Ordered in ED Medications  oxyCODONE-acetaminophen (PERCOCET/ROXICET) 5-325 MG per tablet 1 tablet (has no administration in time range)  indomethacin (INDOCIN) capsule 50 mg (has no administration in time range)    ED Course  I have reviewed the triage vital signs and the nursing notes.  Pertinent labs & imaging results that were available during my care of the patient were reviewed by me and considered in my medical decision making (see chart for details).    MDM Rules/Calculators/A&P                          Patient to ED with c/o swollen and painful left ankle without injury.   DDx include gouty arthritis vs soft tissue inflammation. Will treat with Indocin, #8 Percocet for pain. Crutches provided. She is an established patient with [facility]. Will refer to them for further evaluation and management if symptoms persist/worsen.   Final Clinical Impression(s) / ED Diagnoses Final diagnoses:  None   1. Left ankle pain  Rx / DC Orders ED Discharge Orders    None       Elpidio Anis, Cordelia Poche 10/10/20  1349    Gerhard Munch, MD 10/10/20 1529

## 2020-10-26 NOTE — Progress Notes (Addendum)
Cardiology Office Note   Date:  10/27/2020   ID:  Kim, [name] 11-27-1965, MRN [id number]  PCP:  Frederich Chick., MD  Cardiologist:   Rollene Rotunda, MD   Chief Complaint  Patient presents with  . Coronary Artery Disease      History of Present Illness: [name] is a 55 y.o. female who presents for follow up of coronary artery disease and history of CABG. She underwent coronary angiography in 2014 and all of her bypass grafts were patent at that time. Most recent echocardiogram performed on 06/14/2017 showed normal left ventricular systolic and diastolic function, LVEF 60 to 81%.    She had a stress perfusion study in December that demonstrated no perfusion defects.  She continues to get some burning discomfort if she climbs up stairs to her apartment or walks quickly in the grocery store.  She does not take any NTG.  This is a stable pattern and no different than it was at the time of her stress test in Dec.     Past Medical History:  Diagnosis Date  . Anxiety   . Asthma   . Blood transfusion without reported diagnosis    2010 CABG  . CAD (coronary artery disease)    4v CABG, 9/10; NL LVF, status post followup Cardiolite November 2012 no ischemia ejection fraction 65%  . Cataract    are forming  . Diabetes mellitus   . Dyslipidemia     LDL 22 mg percent on Crestor  . Esophageal stricture   . Fatty liver   . Gastroparesis due to DM (HCC)   . GERD (gastroesophageal reflux disease)   . Glaucoma   . Hyperlipidemia   . Hypertension   . Hypothyroidism   . Left-sided chest wall pain   . Myocardial infarction [facility]) 2010   2008 x 2 stents, 2009 x 2 stents. 01-2009 CABG x 4     Past Surgical History:  Procedure Laterality Date  . ANGIOPLASTY     with stenting 4481,8563  . CARDIAC SURGERY    . CESAREAN SECTION    . CORONARY ARTERY BYPASS GRAFT  01/26/2009  . EYE SURGERY    . open heart surgery   2010  . TUBAL LIGATION       Current Outpatient  Medications  Medication Sig Dispense Refill  . albuterol (VENTOLIN HFA) 108 (90 Base) MCG/ACT inhaler Inhale 2 puffs into the lungs every 6 (six) hours as needed for wheezing or shortness of breath. 8 g 1  . ALPRAZolam (XANAX) 0.25 MG tablet Take 0.25 mg by mouth 2 (two) times daily as needed for anxiety or sleep.   1  . aspirin 81 MG chewable tablet Chew 81 mg by mouth daily.    . Fluticasone-Umeclidin-Vilant (TRELEGY ELLIPTA) 100-62.5-25 MCG/INH AEPB Inhale into the lungs daily.    . furosemide (LASIX) 40 MG tablet TAKE 1 TABLET BY MOUTH EVERY DAY 30 tablet 0  . indomethacin (INDOCIN) 50 MG capsule Take 1 capsule (50 mg total) by mouth 2 (two) times daily with a meal. 20 capsule 0  . Insulin Human (INSULIN PUMP) SOLN Inject into the skin See admin instructions. Pt uses up to 150 units of NOVOLOG daily VIA PUMP    . lansoprazole (PREVACID SOLUTAB) 30 MG disintegrating tablet TAKE 1 TABLET BY MOUTH EVERY DAY 30 tablet 5  . levothyroxine (SYNTHROID) 137 MCG tablet Take 137 mcg by mouth daily before breakfast.    . liraglutide (VICTOZA) 18  MG/3ML SOPN Inject 1.8 mg into the skin daily.    . Magnesium Oxide 500 MG CAPS Take 1 capsule by mouth every morning.     . metoprolol tartrate (LOPRESSOR) 25 MG tablet Take 0.5 tablets (12.5 mg total) by mouth daily. 45 tablet 3  . nitroGLYCERIN (NITROSTAT) 0.4 MG SL tablet Place 1 tablet (0.4 mg total) under the tongue every 5 (five) minutes x 3 doses as needed for chest pain (if no relief after 2nd dose, proceed to the ED for an evaluation or call 911). 25 tablet 3  . NOVOLOG 100 UNIT/ML injection Inject 100-150 Units into the skin as directed. Use up to 100-150 units daily in insulin pump as needed  5  . oxyCODONE-acetaminophen (PERCOCET/ROXICET) 5-325 MG tablet Take 1 tablet by mouth every 4 (four) hours as needed for severe pain. 8 tablet 0  . potassium chloride (K-DUR) 10 MEQ tablet Take 1 tablet (10 mEq total) by mouth daily. 30 tablet 6  .  Pseudoeph-Doxylamine-DM-APAP (NYQUIL PO) Take 30 mLs by mouth at bedtime as needed (for cough).    . rosuvastatin (CRESTOR) 20 MG tablet Take 20 mg by mouth daily.    . vitamin C (ASCORBIC ACID) 500 MG tablet Take 500 mg by mouth daily.    Marland Kitchen zinc sulfate 220 (50 Zn) MG capsule Take 1 capsule (220 mg total) by mouth daily. 10 capsule 0   No current facility-administered medications for this visit.    Allergies:   Metformin, Penicillins, and Metoclopramide    ROS:  Please see the history of present illness.   Otherwise, review of systems are positive for none.   All other systems are reviewed and negative.    PHYSICAL EXAM: VS:  BP 140/80   Pulse 80   Ht 5\' 4"  (1.626 m)   Wt 246 lb (111.6 kg)   BMI 42.23 kg/m  , BMI Body mass index is 42.23 kg/m. GENERAL:  Well appearing NECK:  No jugular venous distention, waveform within normal limits, carotid upstroke brisk and symmetric, no bruits, no thyromegaly LUNGS: Decreased breath sounds with mild scattered diffuse wheezing CHEST:  Well healed sternotomy scar. HEART:  PMI not displaced or sustained,S1 and S2 within normal limits, no S3, no S4, no clicks, no rubs, no murmurs ABD:  Flat, positive bowel sounds normal in frequency in pitch, no bruits, no rebound, no guarding, no midline pulsatile mass, no hepatomegaly, no splenomegaly EXT:  2 plus pulses throughout, no edema, no cyanosis no clubbing   EKG:  EKG is not ordered today.    Recent Labs: No results found for requested labs within last 8760 hours.   Other studies Reviewed: Additional studies/ records that were reviewed today include: Labs Review of the above records demonstrates:  Please see elsewhere in the note.     ASSESSMENT AND PLAN:  CAD:    The patient has no new sypmtoms.  No further cardiovascular testing is indicated.  We will continue with aggressive risk reduction and meds as listed.  She has a stable exertional angina pattern with a negative perfusion study done  to evaluate this.  She will continue with risk reduction.  Unfortunately she is not really participating very well in risk reduction.  HTN: Her blood pressure is checked at home and in the 120s systolic and diastolics are normal.  No change in therapy.   DYSLIPIDEMIA: She unfortunately cannot swallow the pills and cannot take a statin.  She has not been taking her Crestor.  I am  going to check a fasting lipid profile and refer her to our lipid clinic to start PCSK9.   DM:    Her A1c is 9 which is slightly better than previous.  She was prescribed Trulicity but has not started it and I have suggested that she should discuss goals of therapy.  She understands goals of diet.  She has not been exercising because she has some tendinitis and she needs to get this managed so she can get out and be active.   Current medicines are reviewed at length with the patient today.  The patient does not have concerns regarding medicines.  The following changes have been made: As above  Labs/ tests ordered today include:   Orders Placed This Encounter  Procedures  . Lipid panel  . AMB Referral to [facility]     Disposition:   FU with me in 12 months or sooner if needed   Signed, Rollene Rotunda, MD  10/27/2020 11:28 AM    [facility]

## 2020-10-27 ENCOUNTER — Encounter: Payer: Self-pay | Admitting: Cardiology

## 2020-10-27 ENCOUNTER — Other Ambulatory Visit: Payer: Self-pay

## 2020-10-27 ENCOUNTER — Ambulatory Visit (INDEPENDENT_AMBULATORY_CARE_PROVIDER_SITE_OTHER): Payer: Medicare Other | Admitting: Cardiology

## 2020-10-27 ENCOUNTER — Other Ambulatory Visit: Payer: Self-pay | Admitting: *Deleted

## 2020-10-27 VITALS — BP 140/80 | HR 80 | Ht 64.0 in | Wt 246.0 lb

## 2020-10-27 DIAGNOSIS — I25708 Atherosclerosis of coronary artery bypass graft(s), unspecified, with other forms of angina pectoris: Secondary | ICD-10-CM

## 2020-10-27 DIAGNOSIS — Z794 Long term (current) use of insulin: Secondary | ICD-10-CM

## 2020-10-27 DIAGNOSIS — I10 Essential (primary) hypertension: Secondary | ICD-10-CM | POA: Diagnosis not present

## 2020-10-27 DIAGNOSIS — E118 Type 2 diabetes mellitus with unspecified complications: Secondary | ICD-10-CM

## 2020-10-27 DIAGNOSIS — Z79899 Other long term (current) drug therapy: Secondary | ICD-10-CM

## 2020-10-27 DIAGNOSIS — E785 Hyperlipidemia, unspecified: Secondary | ICD-10-CM

## 2020-10-27 NOTE — Patient Instructions (Signed)
Medication Instructions:  The current medical regimen is effective;  continue present plan and medications.  *If you need a refill on your cardiac medications before your next appointment, please call your pharmacy*  Lab Work: Please have fasting Lipid panel.  If you have labs (blood work) drawn today and your tests are completely normal, you will receive your results only by: Marland Kitchen MyChart Message (if you have MyChart) OR . A paper copy in the mail If you have any lab test that is abnormal or we need to change your treatment, we will call you to review the results.  You have been referred to the Lipid Clinic at [address], [address], [place], [place].  You will be contacted to be scheduled.  Follow-Up: At [facility], you and your health needs are our priority.  As part of our continuing mission to provide you with exceptional heart care, we have created designated Provider Care Teams.  These Care Teams include your primary Cardiologist (physician) and Advanced Practice Providers (APPs -  Physician Assistants and Nurse Practitioners) who all work together to provide you with the care you need, when you need it.  We recommend signing up for the patient portal called "MyChart".  Sign up information is provided on this After Visit Summary.  MyChart is used to connect with patients for Virtual Visits (Telemedicine).  Patients are able to view lab/test results, encounter notes, upcoming appointments, etc.  Non-urgent messages can be sent to your provider as well.   To learn more about what you can do with MyChart, go to [email or web address].    Your next appointment:   1 year(s)  The format for your next appointment:   In Person  Provider:   Rollene Rotunda, MD   Thank you for choosing [facility]!!

## 2020-11-17 LAB — LIPID PANEL
Chol/HDL Ratio: 3.8 ratio (ref 0.0–4.4)
Cholesterol, Total: 211 mg/dL — ABNORMAL HIGH (ref 100–199)
HDL: 56 mg/dL (ref >39)
LDL Chol Calc (NIH): 133 mg/dL — ABNORMAL HIGH (ref 0–99)
Triglycerides: 122 mg/dL (ref 0–149)
VLDL Cholesterol Cal: 22 mg/dL (ref 5–40)

## 2020-11-18 ENCOUNTER — Ambulatory Visit: Payer: Medicare Other

## 2020-11-26 ENCOUNTER — Ambulatory Visit (INDEPENDENT_AMBULATORY_CARE_PROVIDER_SITE_OTHER): Payer: Medicare Other | Admitting: Pharmacist Clinician (PhC)/ Clinical Pharmacy Specialist

## 2020-11-26 ENCOUNTER — Other Ambulatory Visit: Payer: Self-pay

## 2020-11-26 DIAGNOSIS — I25708 Atherosclerosis of coronary artery bypass graft(s), unspecified, with other forms of angina pectoris: Secondary | ICD-10-CM

## 2020-11-26 DIAGNOSIS — E782 Mixed hyperlipidemia: Secondary | ICD-10-CM | POA: Diagnosis not present

## 2020-11-26 MED ORDER — ROSUVASTATIN CALCIUM 20 MG PO TABS: 20.0000 mg | ORAL_TABLET | Freq: Every day | ORAL | 3 refills | Status: DC

## 2020-11-26 NOTE — Progress Notes (Signed)
11/30/2020 [name] [birth year] [id number]   HPI:  [name] is a 55 y.o. female patient of Dr Antoine Poche, who presents today for a lipid clinic evaluation.  See pertinent past medical history below.  She has a strong history of coronary disease, with her first stents placed in 2008 at the age of 15.  Within a two year time period she had 4 stents placed on 2 separate occasions, then CABG x 4.  She is also an insulin dependent diabetic and has issues with swallowing, due to esophageal stricture.  She has to crush all medications or take in liquid form.  She has been prescribed rosuvastatin regularly for the past 10 years, however has not taken any in some time.  Looking back in Epic it was noted that she had the rosuvastatin decreased from 40 to 20 mg daily when her LDL was as low as 22.  By 2015 it was noted that her LDL was > 100 and she had been cutting back on rosuvastatin due to feeling poorly.  Unsure at what point she discontinued the medication.    Past Medical History: ASCVD DES x 2 (2008), DES x 2 (2009), CABG x 4 (2010)  DM1 - dx at 22 5/22 A1c 9.0 - on Victoza, Novolog pump  hypertension Elevated in office - on metoprolol tartrate  hypothyoridism 5/22 TSH 0.430 on levothyroxine 0.137  Esophageal stricture Causes difficulty in swallowing meds, states has esophageal stretching done about once a year.   Current Medications: none  Cholesterol Goals: LDL < 70   Intolerant/previously tried: none  Family history: father died from emphysema - 81; mom now 8 with DM2; 2 half brothers (dad) - no heart disease, 2 half sisters (mom) no heart disease; 1 son now 104 healthy  Diet: mostly eats breakfast and dinner, rarely lunch, not much for snacking; good mix of protein, not many vegetables;   Exercise:  not aable to do much, hairline fracture in left ankle  Labs: 6/22:  TC 211, TG 122, HDL 56, LDL 133   Current Outpatient Medications  Medication Sig Dispense Refill   albuterol  (VENTOLIN HFA) 108 (90 Base) MCG/ACT inhaler Inhale 2 puffs into the lungs every 6 (six) hours as needed for wheezing or shortness of breath. 8 g 1   ALPRAZolam (XANAX) 0.25 MG tablet Take 0.25 mg by mouth 2 (two) times daily as needed for anxiety or sleep.   1   aspirin 81 MG chewable tablet Chew 81 mg by mouth daily.     Fluticasone-Umeclidin-Vilant (TRELEGY ELLIPTA) 100-62.5-25 MCG/INH AEPB Inhale into the lungs daily.     furosemide (LASIX) 40 MG tablet TAKE 1 TABLET BY MOUTH EVERY DAY 30 tablet 0   indomethacin (INDOCIN) 50 MG capsule Take 1 capsule (50 mg total) by mouth 2 (two) times daily with a meal. 20 capsule 0   Insulin Human (INSULIN PUMP) SOLN Inject into the skin See admin instructions. Pt uses up to 150 units of NOVOLOG daily VIA PUMP     lansoprazole (PREVACID SOLUTAB) 30 MG disintegrating tablet TAKE 1 TABLET BY MOUTH EVERY DAY 30 tablet 5   levothyroxine (SYNTHROID) 137 MCG tablet Take 137 mcg by mouth daily before breakfast.     liraglutide (VICTOZA) 18 MG/3ML SOPN Inject 1.8 mg into the skin daily.     Magnesium Oxide 500 MG CAPS Take 1 capsule by mouth every morning.      metoprolol tartrate (LOPRESSOR) 25 MG tablet Take 0.5 tablets (12.5 mg total)  by mouth daily. 45 tablet 3   nitroGLYCERIN (NITROSTAT) 0.4 MG SL tablet Place 1 tablet (0.4 mg total) under the tongue every 5 (five) minutes x 3 doses as needed for chest pain (if no relief after 2nd dose, proceed to the ED for an evaluation or call 911). 25 tablet 3   NOVOLOG 100 UNIT/ML injection Inject 100-150 Units into the skin as directed. Use up to 100-150 units daily in insulin pump as needed  5   oxyCODONE-acetaminophen (PERCOCET/ROXICET) 5-325 MG tablet Take 1 tablet by mouth every 4 (four) hours as needed for severe pain. 8 tablet 0   potassium chloride (K-DUR) 10 MEQ tablet Take 1 tablet (10 mEq total) by mouth daily. 30 tablet 6   prednisoLONE (PRELONE) 15 MG/5ML SOLN Take 15 mg by mouth 2 (two) times daily.      Pseudoeph-Doxylamine-DM-APAP (NYQUIL PO) Take 30 mLs by mouth at bedtime as needed (for cough).     rosuvastatin (CRESTOR) 20 MG tablet Take 1 tablet (20 mg total) by mouth daily. 90 tablet 3   topiramate (TOPAMAX) 25 MG capsule Take 75 mg by mouth at bedtime.     triamcinolone ointment (KENALOG) 0.1 % Apply 1 application topically daily as needed.     vitamin C (ASCORBIC ACID) 500 MG tablet Take 500 mg by mouth daily.     zinc sulfate 220 (50 Zn) MG capsule Take 1 capsule (220 mg total) by mouth daily. 10 capsule 0   No current facility-administered medications for this visit.    Allergies  Allergen Reactions   Metformin Anaphylaxis   Penicillins Anaphylaxis and Other (See Comments)    Has patient had a PCN reaction causing immediate rash, facial/tongue/throat swelling, SOB or lightheadedness with hypotension: Yes Has patient had a PCN reaction causing severe rash involving mucus membranes or skin necrosis: No Has patient had a PCN reaction that required hospitalization No Has patient had a PCN reaction occurring within the last 10 years: No If all of the above answers are "NO", then may proceed with Cephalosporin use.   Metoclopramide Other (See Comments)    Reaction:  Nightmares     Past Medical History:  Diagnosis Date   Anxiety    Asthma    Blood transfusion without reported diagnosis    2010 CABG   CAD (coronary artery disease)    4v CABG, 9/10; NL LVF, status post followup Cardiolite November 2012 no ischemia ejection fraction 65%   Cataract    are forming   Diabetes mellitus    Dyslipidemia     LDL 22 mg percent on Crestor   Esophageal stricture    Fatty liver    Gastroparesis due to DM (HCC)    GERD (gastroesophageal reflux disease)    Glaucoma    Hyperlipidemia    Hypertension    Hypothyroidism    Left-sided chest wall pain    Myocardial infarction (HCC) 2010   2008 x 2 stents, 2009 x 2 stents. 01-2009 CABG x 4     Blood pressure 138/68, pulse 79, resp. rate  15, height 5\' 4"  (1.626 m), weight 246 lb 6.4 oz (111.8 kg), SpO2 98 %.   Hyperlipidemia Patient with ASCVD and hyperlipidemia, not at LDL goal.  She has been on any medication for some time.  Advised that she start the rosuvastatin 20 mg once daily and that it is okay to crush.  Once crushed she should mix with a spoonful of yogurt or applesauce, but not water.  She will  need to repeat labs after 2 months to look for any improvement in LDL.  Patient agreeable to plan.     Phillips Hay PharmD CPP [facility] [address] [place], [place] [address] [phone]

## 2020-11-26 NOTE — Patient Instructions (Signed)
Your Results:             Your most recent labs Goal  Total Cholesterol 211 < 200  Triglycerides 122 < 150  HDL (happy/good cholesterol) 56 > 40  LDL (lousy/bad cholesterol 133 < 55   Medication changes:  Start rosuvastatin 20 mg once daily.  Crush the tablet and add to 1-2 teaspoons of applesauce or yogurt.    Lab orders:  After 2 months, we will have you repeat cholesterol labs   Thank you for choosing [facility]

## 2020-11-30 ENCOUNTER — Encounter: Payer: Self-pay | Admitting: Pharmacist Clinician (PhC)/ Clinical Pharmacy Specialist

## 2020-11-30 NOTE — Assessment & Plan Note (Signed)
Patient with ASCVD and hyperlipidemia, not at LDL goal.  She has been on any medication for some time.  Advised that she start the rosuvastatin 20 mg once daily and that it is okay to crush.  Once crushed she should mix with a spoonful of yogurt or applesauce, but not water.  She will need to repeat labs after 2 months to look for any improvement in LDL.  Patient agreeable to plan.

## 2020-12-10 ENCOUNTER — Ambulatory Visit (INDEPENDENT_AMBULATORY_CARE_PROVIDER_SITE_OTHER): Payer: Medicare Other | Admitting: Podiatry

## 2020-12-10 ENCOUNTER — Other Ambulatory Visit: Payer: Self-pay | Admitting: Podiatry

## 2020-12-10 ENCOUNTER — Other Ambulatory Visit: Payer: Self-pay

## 2020-12-10 ENCOUNTER — Encounter: Payer: Self-pay | Admitting: Podiatry

## 2020-12-10 DIAGNOSIS — B351 Tinea unguium: Secondary | ICD-10-CM

## 2020-12-10 DIAGNOSIS — L853 Xerosis cutis: Secondary | ICD-10-CM | POA: Diagnosis not present

## 2020-12-10 MED ORDER — CICLOPIROX 8 % EX SOLN: Freq: Every day | CUTANEOUS | 0 refills | Status: DC

## 2020-12-10 MED ORDER — AMMONIUM LACTATE 12 % EX LOTN: 1.0000 "application " | TOPICAL_LOTION | CUTANEOUS | 0 refills | Status: DC | PRN

## 2020-12-10 NOTE — Progress Notes (Signed)
Subjective:  Patient ID: [name], female    DOB: August 29, 1965,  MRN: [id number]  Chief Complaint  Patient presents with   Diabetes    FOOT EXAM  NAIL FUNGUS  DRY CRACKED SKIN    55 y.o. female presents with the above complaint.  Patient presents with complaint of bilateral hallux thickened elongated dystrophic nails and bilateral dry skin to both plantar extremity.  Patient states that this has been going for quite some time the dry skin has been progressively getting worse over time.  She has not seen anyone else prior to seeing me.  She wanted to get it evaluated.  I discussed options.  She is occasionally puts lotion on.  She is not very compliant with it.  She also would like to discuss treatment for nail fungus.  She also has fatty liver and cannot do Lamisil.  She would like to discuss topical options   Review of Systems: Negative except as noted in the HPI. Denies N/V/F/Ch.  Past Medical History:  Diagnosis Date   Anxiety    Asthma    Blood transfusion without reported diagnosis    2010 CABG   CAD (coronary artery disease)    4v CABG, 9/10; NL LVF, status post followup Cardiolite November 2012 no ischemia ejection fraction 65%   Cataract    are forming   Diabetes mellitus    Dyslipidemia     LDL 22 mg percent on Crestor   Esophageal stricture    Fatty liver    Gastroparesis due to DM (HCC)    GERD (gastroesophageal reflux disease)    Glaucoma    Hyperlipidemia    Hypertension    Hypothyroidism    Left-sided chest wall pain    Myocardial infarction (HCC) 2010   2008 x 2 stents, 2009 x 2 stents. 01-2009 CABG x 4     Current Outpatient Medications:    ammonium lactate (AMLACTIN) 12 % lotion, Apply 1 application topically as needed for dry skin., Disp: 400 g, Rfl: 0   ciclopirox (PENLAC) 8 % solution, Apply topically at bedtime. Apply over nail and surrounding skin. Apply daily over previous coat. After seven (7) days, may remove with alcohol and continue  cycle., Disp: 6.6 mL, Rfl: 0   albuterol (VENTOLIN HFA) 108 (90 Base) MCG/ACT inhaler, Inhale 2 puffs into the lungs every 6 (six) hours as needed for wheezing or shortness of breath., Disp: 8 g, Rfl: 1   ALPRAZolam (XANAX) 0.25 MG tablet, Take 0.25 mg by mouth 2 (two) times daily as needed for anxiety or sleep. , Disp: , Rfl: 1   aspirin 81 MG chewable tablet, Chew 81 mg by mouth daily., Disp: , Rfl:    Fluticasone-Umeclidin-Vilant (TRELEGY ELLIPTA) 100-62.5-25 MCG/INH AEPB, Inhale into the lungs daily., Disp: , Rfl:    furosemide (LASIX) 40 MG tablet, TAKE 1 TABLET BY MOUTH EVERY DAY, Disp: 30 tablet, Rfl: 0   indomethacin (INDOCIN) 50 MG capsule, Take 1 capsule (50 mg total) by mouth 2 (two) times daily with a meal., Disp: 20 capsule, Rfl: 0   Insulin Human (INSULIN PUMP) SOLN, Inject into the skin See admin instructions. Pt uses up to 150 units of NOVOLOG daily VIA PUMP, Disp: , Rfl:    lansoprazole (PREVACID SOLUTAB) 30 MG disintegrating tablet, TAKE 1 TABLET BY MOUTH EVERY DAY, Disp: 30 tablet, Rfl: 5   levothyroxine (SYNTHROID) 137 MCG tablet, Take 137 mcg by mouth daily before breakfast., Disp: , Rfl:    liraglutide (VICTOZA) 18  MG/3ML SOPN, Inject 1.8 mg into the skin daily., Disp: , Rfl:    Magnesium Oxide 500 MG CAPS, Take 1 capsule by mouth every morning. , Disp: , Rfl:    metoprolol tartrate (LOPRESSOR) 25 MG tablet, Take 0.5 tablets (12.5 mg total) by mouth daily., Disp: 45 tablet, Rfl: 3   nitroGLYCERIN (NITROSTAT) 0.4 MG SL tablet, Place 1 tablet (0.4 mg total) under the tongue every 5 (five) minutes x 3 doses as needed for chest pain (if no relief after 2nd dose, proceed to the ED for an evaluation or call 911)., Disp: 25 tablet, Rfl: 3   NOVOLOG 100 UNIT/ML injection, Inject 100-150 Units into the skin as directed. Use up to 100-150 units daily in insulin pump as needed, Disp: , Rfl: 5   oxyCODONE-acetaminophen (PERCOCET/ROXICET) 5-325 MG tablet, Take 1 tablet by mouth every 4 (four)  hours as needed for severe pain., Disp: 8 tablet, Rfl: 0   potassium chloride (K-DUR) 10 MEQ tablet, Take 1 tablet (10 mEq total) by mouth daily., Disp: 30 tablet, Rfl: 6   prednisoLONE (PRELONE) 15 MG/5ML SOLN, Take 15 mg by mouth 2 (two) times daily., Disp: , Rfl:    Pseudoeph-Doxylamine-DM-APAP (NYQUIL PO), Take 30 mLs by mouth at bedtime as needed (for cough)., Disp: , Rfl:    rosuvastatin (CRESTOR) 20 MG tablet, Take 1 tablet (20 mg total) by mouth daily., Disp: 90 tablet, Rfl: 3   topiramate (TOPAMAX) 25 MG capsule, Take 75 mg by mouth at bedtime., Disp: , Rfl:    triamcinolone ointment (KENALOG) 0.1 %, Apply 1 application topically daily as needed., Disp: , Rfl:    vitamin C (ASCORBIC ACID) 500 MG tablet, Take 500 mg by mouth daily., Disp: , Rfl:    zinc sulfate 220 (50 Zn) MG capsule, Take 1 capsule (220 mg total) by mouth daily., Disp: 10 capsule, Rfl: 0  Social History   Tobacco Use  Smoking Status Former   Packs/day: 1.00   Years: 3.00   Pack years: 3.00   Types: Cigarettes   Start date: 03/14/1991   Quit date: 05/22/1993   Years since quitting: 27.5  Smokeless Tobacco Never  Tobacco Comments    Year Quit: 1995    Allergies  Allergen Reactions   Metformin Anaphylaxis   Penicillins Anaphylaxis and Other (See Comments)    Has patient had a PCN reaction causing immediate rash, facial/tongue/throat swelling, SOB or lightheadedness with hypotension: Yes Has patient had a PCN reaction causing severe rash involving mucus membranes or skin necrosis: No Has patient had a PCN reaction that required hospitalization No Has patient had a PCN reaction occurring within the last 10 years: No If all of the above answers are "NO", then may proceed with Cephalosporin use.   Metoclopramide Other (See Comments)    Reaction:  Nightmares    Objective:  There were no vitals filed for this visit. There is no height or weight on file to calculate BMI. Constitutional Well developed. Well  nourished.  Vascular Dorsalis pedis pulses palpable bilaterally. Posterior tibial pulses palpable bilaterally. Capillary refill normal to all digits.  No cyanosis or clubbing noted. Pedal hair growth normal.  Neurologic Normal speech. Oriented to person, place, and time. Epicritic sensation to light touch grossly present bilaterally.  Dermatologic Nails thickened elongated dystrophic toenails noted to bilateral hallux.  Mild pain on palpation Skin severe dry skin noted with underlying cracking of the skin without open ulceration.  Skin fissures noted.  No clinical signs of infection noted.  Orthopedic: Normal joint ROM without pain or crepitus bilaterally. No visible deformities. No bony tenderness.   Radiographs: None Assessment:   1. Onychomycosis due to dermatophyte   2. Xerosis of skin    Plan:  Patient was evaluated and treated and all questions answered.  Bilateral plantar skin xerosis -I explained to the patient the etiology of xerosis and various treatment options were extensively discussed.  I explained to the patient the importance of maintaining moisturization of the skin with application of over-the-counter lotion such as Eucerin or Luciderm.  Patient has failed over-the-counter lotion and therefore I believe given the nature of severe xerosis she will benefit from ammonium lactate.  Ammonium lactate was sent to the pharmacy.  Onychomycosis bilateral hallux -Educated the patient on the etiology of onychomycosis and various treatment options associated with improving the fungal load.  I explained to the patient that there is 3 treatment options available to treat the onychomycosis including topical, p.o., laser treatment.  Patient has elected to undergo topical medication as she is not a ideal candidate for oral medication.  I discussed with her to apply twice a day for 6 to 8 months.  Patient states understanding will do so.    No follow-ups on file.

## 2020-12-10 NOTE — Progress Notes (Signed)
am

## 2020-12-14 ENCOUNTER — Ambulatory Visit (INDEPENDENT_AMBULATORY_CARE_PROVIDER_SITE_OTHER): Payer: Medicare Other | Admitting: Adult Health

## 2020-12-14 ENCOUNTER — Encounter: Payer: Self-pay | Admitting: Adult Health

## 2020-12-14 ENCOUNTER — Other Ambulatory Visit: Payer: Self-pay

## 2020-12-14 VITALS — BP 132/64 | HR 71 | Ht 62.25 in | Wt 249.0 lb

## 2020-12-14 DIAGNOSIS — Z780 Asymptomatic menopausal state: Secondary | ICD-10-CM | POA: Diagnosis not present

## 2020-12-14 DIAGNOSIS — Z01419 Encounter for gynecological examination (general) (routine) without abnormal findings: Secondary | ICD-10-CM | POA: Insufficient documentation

## 2020-12-14 DIAGNOSIS — Z1211 Encounter for screening for malignant neoplasm of colon: Secondary | ICD-10-CM

## 2020-12-14 DIAGNOSIS — Z1231 Encounter for screening mammogram for malignant neoplasm of breast: Secondary | ICD-10-CM

## 2020-12-14 LAB — HEMOCCULT GUIAC POC 1CARD (OFFICE): Fecal Occult Blood, POC: NEGATIVE

## 2020-12-14 NOTE — Progress Notes (Signed)
Patient ID: [name], female   DOB: 02/08/1966, 55 y.o.   MRN: [id number] History of Present Illness: [name] is a 55 year old white female, engaged, PM, in for a well woman gyn exam. Lab Results  Component Value Date   DIAGPAP  11/28/2019    - Negative for intraepithelial lesion or malignancy (NILM)   HPVHIGH Negative 11/28/2019   PCP is Dr Malen Gauze.   Current Medications, Allergies, Past Medical History, Past Surgical History, Family History and Social History were reviewed in [employer] record.     Review of Systems: Patient denies any headaches, hearing loss, fatigue, blurred vision, shortness of breath, chest pain, abdominal pain, problems with bowel movements, urination, or intercourse. No joint pain or mood swings. Denies any vaginal bleeding     Physical Exam:BP 132/64 (BP Location: Right Arm, Patient Position: Sitting, Cuff Size: Large)   Pulse 71   Ht 5' 2.25" (1.581 m)   Wt 249 lb (112.9 kg)   LMP 09/22/2019   BMI 45.18 kg/m   General:  Well developed, well nourished, no acute distress Skin:  Warm and dry Neck:  Midline trachea, normal thyroid, good ROM, no lymphadenopathy Lungs; Clear to auscultation bilaterally Breast:  No dominant palpable mass, retraction, or nipple discharge Cardiovascular: Regular rate and rhythm Abdomen:  Soft, non tender, no hepatosplenomegaly Pelvic:  External genitalia is normal in appearance, no lesions.  The vagina is normal in appearance. Urethra has no lesions or masses. The cervix is smooth.  Uterus is felt to be normal size, shape, and contour.  No adnexal masses or tenderness noted.Bladder is non tender, no masses felt. Rectal: Good sphincter tone, no polyps, or hemorrhoids felt.  Hemoccult negative. Extremities/musculoskeletal:  No swelling or varicosities noted, no clubbing or cyanosis Psych:  No mood changes, alert and cooperative,seems happy AA is 0 Fall risk is low Depression screen [facility] 2/9  12/14/2020 11/28/2019  Decreased Interest 0 0  Down, Depressed, Hopeless 0 0  PHQ - 2 Score 0 0  Altered sleeping 1 0  Tired, decreased energy 1 1  Change in appetite 1 0  Feeling bad or failure about yourself  0 0  Trouble concentrating 0 0  Moving slowly or fidgety/restless 0 0  Suicidal thoughts 0 0  PHQ-9 Score 3 1  Difficult doing work/chores - Somewhat difficult  Some recent data might be hidden    GAD 7 : Generalized Anxiety Score 12/14/2020 11/28/2019  Nervous, Anxious, on Edge 0 0  Control/stop worrying 0 0  Worry too much - different things 0 0  Trouble relaxing 0 0  Restless 0 0  Easily annoyed or irritable 0 0  Afraid - awful might happen 0 0  Total GAD 7 Score 0 0      Upstream - 12/14/20 1144       Pregnancy Intention Screening   Does the patient want to become pregnant in the next year? No    Does the patient's partner want to become pregnant in the next year? No    Would the patient like to discuss contraceptive options today? No      Contraception Wrap Up   Current Method Female Sterilization    End Method Female Sterilization    Contraception Counseling Provided No            Examination chaperoned by [employer] RN  Impression and Plan: 1. Encounter for well woman exam with routine gynecological exam Physical in 1 year Pap in 2024 Labs with  PCP Colonoscopy per GI   2. Post-menopause   3. Encounter for screening fecal occult blood testing   4. Screening mammogram for breast cancer [facility]

## 2020-12-20 ENCOUNTER — Other Ambulatory Visit: Payer: Self-pay | Admitting: Gastroenterology

## 2020-12-21 ENCOUNTER — Other Ambulatory Visit: Payer: Self-pay | Admitting: Gastroenterology

## 2020-12-21 ENCOUNTER — Telehealth: Payer: Self-pay

## 2020-12-21 NOTE — Telephone Encounter (Signed)
[facility] indicated that medication is ot covered by insurance, generic or brand.  Submitted PA for Prevacid Solutabs once daily.   ID: [id number], Johnney Killian: [id number], PCN: [id number] and Grp: [id number]

## 2020-12-21 NOTE — Telephone Encounter (Signed)
[facility] [employer] PA approved for Prevacid Solutab through 12-21-2021.Member: [id number].  Medicare Part D [employer] customer care: [phone]

## 2021-01-31 ENCOUNTER — Telehealth: Payer: Self-pay

## 2021-01-31 DIAGNOSIS — E782 Mixed hyperlipidemia: Secondary | ICD-10-CM

## 2021-01-31 NOTE — Telephone Encounter (Signed)
Lmomed the pt that if she is taking crestor we need her to complete fasting lipids no appt required and to call back if they have questions

## 2021-01-31 NOTE — Telephone Encounter (Signed)
-----   Message from Rosalee Kaufman, RPH-CPP sent at 01/31/2021  9:06 AM EDT ----- Can you please check with patient and see if she is still taking rosuvastatin (crushing in yogurt).  If so, she will need lipid and hepatic labs.  Thank you Belenda Cruise ----- Message ----- From: Rosalee Kaufman, RPH-CPP Sent: 01/31/2021  12:00 AM EDT To: Rosalee Kaufman, RPH-CPP  Check to see if still on rosuvastatin (crushing tablets in yogurt) - will need labs

## 2021-03-14 ENCOUNTER — Ambulatory Visit: Payer: Medicare Other

## 2021-05-18 ENCOUNTER — Emergency Department (HOSPITAL_COMMUNITY): Payer: Medicare Other

## 2021-05-18 ENCOUNTER — Other Ambulatory Visit: Payer: Self-pay

## 2021-05-18 ENCOUNTER — Emergency Department (HOSPITAL_COMMUNITY)
Admission: EM | Admit: 2021-05-18 | Discharge: 2021-05-18 | Disposition: A | Discharge status: Home / Self Care | Payer: Medicare Other | Attending: Emergency Medicine | Admitting: Emergency Medicine

## 2021-05-18 ENCOUNTER — Encounter (HOSPITAL_COMMUNITY): Payer: Self-pay | Admitting: *Deleted

## 2021-05-18 DIAGNOSIS — I10 Essential (primary) hypertension: Secondary | ICD-10-CM | POA: Insufficient documentation

## 2021-05-18 DIAGNOSIS — Z7951 Long term (current) use of inhaled steroids: Secondary | ICD-10-CM | POA: Insufficient documentation

## 2021-05-18 DIAGNOSIS — Z87891 Personal history of nicotine dependence: Secondary | ICD-10-CM | POA: Diagnosis not present

## 2021-05-18 DIAGNOSIS — Z7982 Long term (current) use of aspirin: Secondary | ICD-10-CM | POA: Diagnosis not present

## 2021-05-18 DIAGNOSIS — J45909 Unspecified asthma, uncomplicated: Secondary | ICD-10-CM | POA: Insufficient documentation

## 2021-05-18 DIAGNOSIS — I2510 Atherosclerotic heart disease of native coronary artery without angina pectoris: Secondary | ICD-10-CM | POA: Diagnosis not present

## 2021-05-18 DIAGNOSIS — Y92002 Bathroom of unspecified non-institutional (private) residence single-family (private) house as the place of occurrence of the external cause: Secondary | ICD-10-CM | POA: Insufficient documentation

## 2021-05-18 DIAGNOSIS — Z8616 Personal history of COVID-19: Secondary | ICD-10-CM | POA: Diagnosis not present

## 2021-05-18 DIAGNOSIS — E039 Hypothyroidism, unspecified: Secondary | ICD-10-CM | POA: Insufficient documentation

## 2021-05-18 DIAGNOSIS — E1143 Type 2 diabetes mellitus with diabetic autonomic (poly)neuropathy: Secondary | ICD-10-CM | POA: Insufficient documentation

## 2021-05-18 DIAGNOSIS — M25561 Pain in right knee: Secondary | ICD-10-CM | POA: Diagnosis not present

## 2021-05-18 DIAGNOSIS — Z794 Long term (current) use of insulin: Secondary | ICD-10-CM | POA: Diagnosis not present

## 2021-05-18 DIAGNOSIS — Z955 Presence of coronary angioplasty implant and graft: Secondary | ICD-10-CM | POA: Insufficient documentation

## 2021-05-18 DIAGNOSIS — X501XXA Overexertion from prolonged static or awkward postures, initial encounter: Secondary | ICD-10-CM | POA: Diagnosis not present

## 2021-05-18 DIAGNOSIS — Z79899 Other long term (current) drug therapy: Secondary | ICD-10-CM | POA: Diagnosis not present

## 2021-05-18 MED ORDER — HYDROCODONE-ACETAMINOPHEN 5-325 MG PO TABS
ORAL_TABLET | ORAL | 0 refills | Status: DC
Start: 2021-05-18 — End: 2021-08-23

## 2021-05-18 MED ORDER — CANE MISC: 0 refills | Status: DC

## 2021-05-18 MED ORDER — NAPROXEN 500 MG PO TABS: 500.0000 mg | ORAL_TABLET | Freq: Two times a day (BID) | ORAL | 0 refills | Status: DC

## 2021-05-18 MED ORDER — HYDROMORPHONE HCL 1 MG/ML IJ SOLN
1.0000 mg | Freq: Once | INTRAMUSCULAR | Status: AC
  Administered 2021-05-18: 15:00:00 1 mg via INTRAMUSCULAR
  Filled 2021-05-18: qty 1

## 2021-05-18 NOTE — Discharge Instructions (Signed)
Wear the knee immobilizer as needed for standing or walking.  Use a cane as needed for standing.  Elevate your knee when possible.  Call the orthopedic provider listed to arrange follow-up appointment.

## 2021-05-18 NOTE — ED Triage Notes (Signed)
Pt arrived via [facility] for right knee pain after she twisted it after a shower.  Pt states she felt a pop and knee pain increased and not able to put weight on it.  Pt was getting ready for a doctor's appt to have same knee checked out.  Right knee pain x 3-4 days.

## 2021-05-18 NOTE — ED Provider Notes (Signed)
[facility] EMERGENCY DEPARTMENT Provider Note   CSN: [id number] Arrival date & time: 05/18/21  1129     History Chief Complaint  Patient presents with   Knee Pain    [name] is a 55 y.o. female.   Knee Pain Associated symptoms: no back pain, no fatigue and no fever        [name] is a 55 y.o. female with past medical history of hypertension, coronary artery disease, and type 2 diabetes who presents to the Emergency Department complaining of gradually worsening right knee pain x3 days.  Pain worse today after she twisted her knee in the shower.  She states that she felt a "pop" in her knee and has been unable to bear weight since that time.  Denies fall.  She describes aching throbbing pain to the front and back of her knee.  Back of the knee feels worse.  She denies swelling of her knee, redness, fever, chills, and calf pain.   Past Medical History:  Diagnosis Date   Anxiety    Asthma    Blood transfusion without reported diagnosis    2010 CABG   CAD (coronary artery disease)    4v CABG, 9/10; NL LVF, status post followup Cardiolite November 2012 no ischemia ejection fraction 65%   Cataract    are forming   Diabetes mellitus    Dyslipidemia     LDL 22 mg percent on Crestor   Esophageal stricture    Fatty liver    Gastroparesis due to DM (HCC)    GERD (gastroesophageal reflux disease)    Glaucoma    Hyperlipidemia    Hypertension    Hypothyroidism    Left-sided chest wall pain    Myocardial infarction (HCC) 2010   2008 x 2 stents, 2009 x 2 stents. 01-2009 CABG x 4     Patient Active Problem List   Diagnosis Date Noted   Screening mammogram for breast cancer 12/14/2020   Encounter for screening fecal occult blood testing 12/14/2020   Post-menopause 12/14/2020   Encounter for well woman exam with routine gynecological exam 12/14/2020   Acute respiratory failure with hypoxia (HCC) 04/17/2019   Uncontrolled type 1 diabetes mellitus with  hypoglycemia without coma, with long-term current use of insulin (HCC) 04/17/2019   COVID-19 virus infection    Pneumonia due to COVID-19 virus 04/16/2019   Acute bronchitis 07/18/2017   Oral thrush 04/19/2017   Chronic seasonal allergic rhinitis 03/19/2017   Diabetes mellitus with complication (HCC)    Atypical chest pain    Muscle spasm 08/20/2014   Obesity, unspecified 08/03/2013   Hx of CABG    Ejection fraction    HTN (hypertension) 08/22/2012   Hypothyroidism 09/29/2011   Edema 08/25/2011   Gastroparesis 09/07/2010   Hyperlipidemia 02/21/2010   Muscle spasm of left shoulder 04/29/2009   PALPITATIONS 04/12/2009   WHEEZING 04/12/2009   GERD 01/19/2009   Chest pain 02/04/2008   Diabetes mellitus type 2, insulin dependent (HCC) 06/12/2007   Anxiety disorder 06/12/2007   Coronary artery disease involving coronary bypass graft of native heart 06/12/2007   Asthma in adult 06/12/2007    Past Surgical History:  Procedure Laterality Date   ANGIOPLASTY     with stenting 2008,2009   CARDIAC SURGERY     CESAREAN SECTION     CORONARY ARTERY BYPASS GRAFT  01/26/2009   2008-2 stents; 2009-2 stents   EYE SURGERY     open heart surgery   2010  TUBAL LIGATION       OB History     Gravida  1   Para  1   Term      Preterm  1   AB      Living  1      SAB      IAB      Ectopic      Multiple      Live Births  1           Family History  Problem Relation Age of Onset   Heart disease Father    Asthma Father    COPD Father    Diabetes Mother    Asthma Paternal Aunt    Asthma Son    Colon cancer Neg Hx    Colon polyps Neg Hx    Esophageal cancer Neg Hx    Rectal cancer Neg Hx    Stomach cancer Neg Hx     Social History   Tobacco Use   Smoking status: Former    Packs/day: 1.00    Years: 3.00    Pack years: 3.00    Types: Cigarettes    Start date: 03/14/1991    Quit date: 05/22/1993    Years since quitting: 28.0   Smokeless tobacco: Never    Tobacco comments:     Year Quit: 1995  Vaping Use   Vaping Use: Never used  Substance Use Topics   Alcohol use: No    Alcohol/week: 0.0 standard drinks   Drug use: No    Home Medications Prior to Admission medications   Medication Sig Start Date End Date Taking? Authorizing Provider  HYDROcodone-acetaminophen (NORCO/VICODIN) 5-325 MG tablet Take one tab po q 4 hrs prn pain 05/18/21  Yes Eissa Buchberger, PA-C  Misc. Devices (CANE) MISC 1 standard cane for standing and ambulation.  Diagnosis right knee pain. 05/18/21  Yes Sidni Fusco, PA-C  naproxen (NAPROSYN) 500 MG tablet Take 1 tablet (500 mg total) by mouth 2 (two) times daily. Take with food 05/18/21  Yes Chimamanda Siegfried, PA-C  albuterol (VENTOLIN HFA) 108 (90 Base) MCG/ACT inhaler Inhale 2 puffs into the lungs every 6 (six) hours as needed for wheezing or shortness of breath. 04/21/19   Meredeth Ide, MD  ALPRAZolam Prudy Feeler) 0.25 MG tablet Take 0.25 mg by mouth 2 (two) times daily as needed for anxiety or sleep.  11/21/17   [provider]  ammonium lactate (AMLACTIN) 12 % lotion Apply 1 application topically as needed for dry skin. 12/10/20   Candelaria Stagers, DPM  aspirin 81 MG chewable tablet Chew 81 mg by mouth daily.    [provider]  ciclopirox (PENLAC) 8 % solution APPLY TOPICALLY AT BEDTIME. APPLY OVER NAIL AND SURROUNDING SKIN. APPLY DAILY OVER PREVIOUS COAT. AFTER SEVEN (7) DAYS, MAY REMOVE WITH ALCOHOL AND CONTINUE CYCLE. 12/10/20   Candelaria Stagers, DPM  Fluticasone-Umeclidin-Vilant (TRELEGY ELLIPTA) 100-62.5-25 MCG/INH AEPB Inhale into the lungs daily.    [provider]  furosemide (LASIX) 40 MG tablet TAKE 1 TABLET BY MOUTH EVERY DAY Patient taking differently: TAKE 1 TABLET BY MOUTH PRN 11/17/19   Laqueta Linden, MD  indomethacin (INDOCIN) 50 MG capsule Take 1 capsule (50 mg total) by mouth 2 (two) times daily with a meal. 10/10/20   Upstill, Melvenia Beam, PA-C  Insulin Human (INSULIN PUMP) SOLN  Inject into the skin See admin instructions. Pt uses up to 150 units of NOVOLOG daily VIA PUMP    [provider]  lansoprazole (PREVACID SOLUTAB) 30 MG disintegrating tablet Take 1 tablet (30 mg total) by mouth daily. Please schedule an office visit for further refills. thanks 12/20/20   Armbruster, Willaim Rayas, MD  levothyroxine (SYNTHROID) 137 MCG tablet Take 137 mcg by mouth daily before breakfast.    [provider]  liraglutide (VICTOZA) 18 MG/3ML SOPN Inject 1.8 mg into the skin daily.    [provider]  Magnesium Oxide 500 MG CAPS Take 1 capsule by mouth every morning.  Patient not taking: Reported on 12/14/2020 04/07/19   [provider]  metoprolol tartrate (LOPRESSOR) 25 MG tablet Take 0.5 tablets (12.5 mg total) by mouth daily. 04/23/20   Rollene Rotunda, MD  nitroGLYCERIN (NITROSTAT) 0.4 MG SL tablet Place 1 tablet (0.4 mg total) under the tongue every 5 (five) minutes x 3 doses as needed for chest pain (if no relief after 2nd dose, proceed to the ED for an evaluation or call 911). 04/23/20   Rollene Rotunda, MD  NOVOLOG 100 UNIT/ML injection Inject 100-150 Units into the skin as directed. Use up to 100-150 units daily in insulin pump as needed 11/23/17   [provider]  potassium chloride (K-DUR) 10 MEQ tablet Take 1 tablet (10 mEq total) by mouth daily. 06/14/17   Laqueta Linden, MD  prednisoLONE (PRELONE) 15 MG/5ML SOLN Take 15 mg by mouth 2 (two) times daily. 11/02/20   [provider]  Pseudoeph-Doxylamine-DM-APAP (NYQUIL PO) Take 30 mLs by mouth at bedtime as needed (for cough).    [provider]  rosuvastatin (CRESTOR) 20 MG tablet Take 1 tablet (20 mg total) by mouth daily. Patient not taking: Reported on 12/14/2020 11/26/20 02/24/21  Rollene Rotunda, MD  topiramate (TOPAMAX) 25 MG capsule Take 75 mg by mouth at bedtime. 11/09/20   [provider]  triamcinolone ointment (KENALOG) 0.1 % Apply 1 application topically  daily as needed. 11/02/20   [provider]  vitamin C (ASCORBIC ACID) 500 MG tablet Take 500 mg by mouth daily.    [provider]  zinc sulfate 220 (50 Zn) MG capsule Take 1 capsule (220 mg total) by mouth daily. 04/22/19   Meredeth Ide, MD    Allergies    Metformin, Penicillins, and Metoclopramide  Review of Systems   Review of Systems  Constitutional:  Negative for chills, fatigue and fever.  Respiratory:  Negative for shortness of breath.   Cardiovascular:  Negative for chest pain and leg swelling.  Musculoskeletal:  Positive for arthralgias (Right knee pain). Negative for back pain.  Skin:  Negative for color change and rash.  Neurological:  Negative for dizziness, weakness and numbness.  Hematological:  Does not bruise/bleed easily.  All other systems reviewed and are negative.  Physical Exam Updated Vital Signs BP (!) 150/86 (BP Location: Left Arm)    Pulse 86    Temp 98.5 F (36.9 C) (Oral)    Resp 18    Ht 5\' 3"  (1.6 m)    Wt 113.4 kg    LMP 09/22/2019    SpO2 96%    BMI 44.29 kg/m   Physical Exam Vitals and nursing note reviewed.  Constitutional:      General: She is not in acute distress.    Appearance: Normal appearance. She is not ill-appearing.  Cardiovascular:     Rate and Rhythm: Normal rate and regular rhythm.     Pulses: Normal pulses.  Pulmonary:     Effort: Pulmonary effort is normal.  Breath sounds: Normal breath sounds.  Chest:     Chest wall: No tenderness.  Musculoskeletal:        General: Tenderness and signs of injury present. No swelling or deformity.     Right knee: No effusion, erythema or crepitus. Decreased range of motion. Tenderness present over the PCL. No patellar tendon tenderness.     Right lower leg: No edema.     Left lower leg: No edema.     Comments: Tenderness palpation of the right popliteal fossa area.  No appreciable edema.  No palpable effusion of the knee joint.  Pain reproduced with valgus and varus  stress.  No erythema or excessive warmth.  Neurological:     Mental Status: She is alert.    ED Results / Procedures / Treatments   Labs (all labs ordered are listed, but only abnormal results are displayed) Labs Reviewed - No data to display  EKG None  Radiology US Venous Img Lower Unilateral Right  Result Date: 05/18/2021 CLINICAL DATA:  Pain EXAM: Right LOWER EXTREMITY VENOUS DOPPLER ULTRASOUND TECHNIQUE: Gray-scale sonography with compression, as well as color and duplex ultrasound, were performed to evaluate the deep venous system(s) from the level of the common femoral vein through the popliteal and proximal calf veins. COMPARISON:  None. FINDINGS: VENOUS Normal compressibility of the common femoral, superficial femoral, and popliteal veins, as well as the visualized calf veins. Visualized portions of profunda femoral vein and great saphenous vein unremarkable. No filling defects to suggest DVT on grayscale or color Doppler imaging. Doppler waveforms show normal direction of venous flow, normal respiratory plasticity and response to augmentation. Limited views of the contralateral common femoral vein are unremarkable. OTHER None. Limitations: none IMPRESSION: There is no evidence of deep venous thrombosis in right lower extremity. Electronically Signed   By: Ernie Avena M.D.   On: 05/18/2021 17:37   DG Knee Complete 4 Views Right  Result Date: 05/18/2021 CLINICAL DATA:  Right knee pain for 3-4 days EXAM: RIGHT KNEE - COMPLETE 4+ VIEW COMPARISON:  None. FINDINGS: No acute fracture or dislocation. No aggressive osseous lesion. Normal alignment. Dystrophic calcifications in the soft tissues along the medial aspect of the proximal lower leg. No radiopaque foreign body or soft tissue emphysema. IMPRESSION: No acute osseous injury of the right knee. Electronically Signed   By: Elige Ko M.D.   On: 05/18/2021 12:43    Procedures Procedures   Medications Ordered in ED Medications   HYDROmorphone (DILAUDID) injection 1 mg (1 mg Intramuscular Given 05/18/21 1508)    ED Course  I have reviewed the triage vital signs and the nursing notes.  Pertinent labs & imaging results that were available during my care of the patient were reviewed by me and considered in my medical decision making (see chart for details).    MDM Rules/Calculators/A&P                          Patient here with gradually worsening right knee pain x3 to 4 days. Pain became worse today after a twisting movement in the shower.  No reported fall.  Patient unable to bear weight to her right knee since this morning.  On exam, she has tenderness to the popliteal fossa.  No obvious laxity on exam.  No erythema or edema no palpable effusion of the knee.  X-ray of the knee without acute bony findings.  Since patient does have some posterior knee pain, we will proceed  with venous ultrasound to evaluate for possible DVT.    On recheck, patient resting comfortably.  Pain is improved after receiving IM Dilaudid here.  Venous imaging without evidence of DVT  Source of patient's posterior knee pain unclear at this time, could represent Baker's cyst or PCL injury.  She does report feeling better, there is no concerning symptoms to suggest septic joint.  I feel that she is appropriate for discharge home, will provide short course of pain medication and knee immobilizer.  Due to patient's body habitus I do not feel that crutches would be a good option, therefore prescription written for a cane to assist with ambulation.  She is agreeable to close outpatient follow-up with orthopedics.      Final Clinical Impression(s) / ED Diagnoses Final diagnoses:  Acute pain of right knee    Rx / DC Orders ED Discharge Orders          Ordered    HYDROcodone-acetaminophen (NORCO/VICODIN) 5-325 MG tablet        05/18/21 1752    naproxen (NAPROSYN) 500 MG tablet  2 times daily        05/18/21 1752    Misc. Devices [facility])  MISC        05/18/21 1752             Pauline Aus, PA-C 05/18/21 2355    Pollyann Savoy, MD 05/19/21 [phone]

## 2021-05-18 NOTE — ED Notes (Signed)
See triage notes. No obvious deformity or swelling noted to right knee/leg. Temp equal to left knee. Pedal pulses strong.

## 2021-05-25 ENCOUNTER — Encounter: Payer: Self-pay | Admitting: Orthopedic Surgery

## 2021-05-25 ENCOUNTER — Other Ambulatory Visit: Payer: Self-pay

## 2021-05-25 ENCOUNTER — Ambulatory Visit (INDEPENDENT_AMBULATORY_CARE_PROVIDER_SITE_OTHER): Payer: Medicare Other | Admitting: Orthopedic Surgery

## 2021-05-25 VITALS — BP 156/69 | HR 81 | Ht 65.0 in | Wt 254.4 lb

## 2021-05-25 DIAGNOSIS — S83241A Other tear of medial meniscus, current injury, right knee, initial encounter: Secondary | ICD-10-CM | POA: Diagnosis not present

## 2021-05-25 DIAGNOSIS — M25561 Pain in right knee: Secondary | ICD-10-CM

## 2021-05-25 MED ORDER — IBUPROFEN 800 MG PO TABS: 800.0000 mg | ORAL_TABLET | Freq: Three times a day (TID) | ORAL | 0 refills | Status: DC | PRN

## 2021-05-25 NOTE — Progress Notes (Signed)
Chief Complaint  Patient presents with   Knee Injury    DOI 05/18/21 seen in ED Pt felt a "pop" and pain when she turned in the shower   ER F/U  This is a 56 year old female who presents to [place] with acute right knee injury on December 28.  She was seen in the emergency room and comes here for her follow-up.  She says that she was having trouble with her right knee about 4 days before the incident.  On the 28th she got out of the shower twisted and felt a pop medial side of the right knee and had severe pain.  She said since that time she still has trouble weightbearing and has trouble bending the knee with medial pain  Her medical history has been reviewed  The ER record was reviewed and indicates basically the same story that she gave me  BP (!) 156/69    Pulse 81    Ht 5\' 5"  (1.651 m)    Wt 254 lb 6.4 oz (115.4 kg)    LMP 09/22/2019    BMI 42.33 kg/m   The patient meets the AMA guidelines for Morbid (severe) obesity with a BMI > 40.0 and I have recommended weight loss.  No developmental abnormalities are seen.  Her nutritional status shows that she is obese.  No gross deformities her grooming is normal.  The pulses and temperature of her right lower extremity are normal and there is no edema or tenderness at the ankle  She is walking with a cane and a cane and she has a limp  She is tender over the medial joint line.  The parapatellar structures seem normal.  She can bend the knee approximately 115 degrees with full extension the knee feels stable in the anterior and posterior plane some muscle tone is normal there are no skin lesions or bruising sensation is intact  She is oriented to time person and place and her mood and affect are normal  I was able to review her x-ray I interpret the x-ray in the following manner  4 views of the knee are obtained dated May 18, 2021  The alignment is normal the joint spaces are preserved there is no evidence of subluxation dislocation or  fracture I do not see any arthritic changes and no bone lesions are seen  Differential diagnosis acute knee sprain medial collateral ligament, torn medial meniscus, patellar subluxation  This is most likely a medial meniscus tear  The patient has several concerning issues regarding possible surgery  She is she takes Ventolin inhaler on an as-needed basis along with Trelegy indicating she has some upper respiratory issues and breathing problems, she is also hypothyroid and she has an insulin pump hypertension.  She is also on chronic opioid therapy  She had stents placed in 2008 and 2009 she has fatty liver disease  I told her based on these factors I would try to treat her nonoperatively leaving surgery as an option only in the last resort  In the meantime were going to institute nonoperative measures as follows  Ice twice a day Knee exercises Anti-inflammatories   Meds ordered this encounter  Medications   ibuprofen (ADVIL) 800 MG tablet    Sig: Take 1 tablet (800 mg total) by mouth every 8 (eight) hours as needed.    Dispense:  60 tablet    Refill:  0   Encounter Diagnoses  Name Primary?   Acute pain of right knee Yes  Acute medial meniscus tear of right knee, initial encounter     We arranged a follow-up visit and we will discuss symptoms and possibly need for MRI if not improved

## 2021-06-08 ENCOUNTER — Ambulatory Visit (INDEPENDENT_AMBULATORY_CARE_PROVIDER_SITE_OTHER): Payer: Medicare Other | Admitting: Orthopedic Surgery

## 2021-06-08 ENCOUNTER — Other Ambulatory Visit: Payer: Self-pay

## 2021-06-08 DIAGNOSIS — M25561 Pain in right knee: Secondary | ICD-10-CM

## 2021-06-08 DIAGNOSIS — S83241D Other tear of medial meniscus, current injury, right knee, subsequent encounter: Secondary | ICD-10-CM

## 2021-06-08 NOTE — Patient Instructions (Signed)
While we are working on your approval for MRI please go ahead and call to schedule your appointment with [facility] within at least one (1) week.   Central Scheduling [phone]  

## 2021-06-08 NOTE — Addendum Note (Signed)
Addended byCaffie Damme on: 06/08/2021 10:29 AM   Modules accepted: Orders

## 2021-06-08 NOTE — Progress Notes (Signed)
° °  Chief Complaint  Patient presents with   Knee Pain    2 wk follow up RT knee Pain is worse/can't apply pressure and it is in constant pain    This is a 56 year old female who is status post 2 stents back in 2008 and 2009  She felt a pop in her right knee on 05/18/2021 we treated her with NSAIDs and knee exercises with ice twice a day her knee is worse she cannot bear weight  Her pain is on the medial side her tenderness is on the medial side she has an effusion she has decreased range of motion especially cannot fully extend she has a positive McMurray's sign  Encounter Diagnoses  Name Primary?   Acute medial meniscus tear of right knee, subsequent encounter Yes   Acute pain of right knee      We recommend MRI right knee

## 2021-06-13 ENCOUNTER — Telehealth: Payer: Self-pay | Admitting: Orthopedic Surgery

## 2021-06-13 NOTE — Telephone Encounter (Signed)
Instructions  While we are working on your approval for MRI please go ahead and call to schedule your appointment with [facility] within at least one (1) week.    Central Scheduling [phone]      That's what I told her and they gave her at check out  Can do injection depending on MRI results  I called her she said she didn't get papers at check out  I gave her the number she voiced understanding

## 2021-06-13 NOTE — Telephone Encounter (Signed)
Patient called and left a voicemail stating she saw Dr. Romeo Apple last week and was told someone from our office would call her back with her appt for her MRI.  She said no one  has called her back yet.    She also wants a cortisone shot too.

## 2021-06-20 ENCOUNTER — Other Ambulatory Visit: Payer: Self-pay | Admitting: Orthopedic Surgery

## 2021-06-20 DIAGNOSIS — M25561 Pain in right knee: Secondary | ICD-10-CM

## 2021-06-23 ENCOUNTER — Ambulatory Visit (HOSPITAL_COMMUNITY)
Admission: RE | Admit: 2021-06-23 | Discharge: 2021-06-23 | Disposition: A | Discharge status: Home / Self Care | Payer: Medicare Other | Source: Ambulatory Visit | Attending: Orthopedic Surgery | Admitting: Orthopedic Surgery

## 2021-06-23 ENCOUNTER — Other Ambulatory Visit: Payer: Self-pay

## 2021-06-23 DIAGNOSIS — M25561 Pain in right knee: Secondary | ICD-10-CM | POA: Insufficient documentation

## 2021-06-23 DIAGNOSIS — S83241D Other tear of medial meniscus, current injury, right knee, subsequent encounter: Secondary | ICD-10-CM | POA: Insufficient documentation

## 2021-06-27 ENCOUNTER — Telehealth: Payer: Self-pay | Admitting: Orthopedic Surgery

## 2021-06-27 NOTE — Telephone Encounter (Signed)
Per Diane (on line) Call report from [facility] - requests to speak with nurse, clinic staff, or provider [phone])

## 2021-06-29 ENCOUNTER — Other Ambulatory Visit: Payer: Self-pay

## 2021-06-29 ENCOUNTER — Ambulatory Visit (INDEPENDENT_AMBULATORY_CARE_PROVIDER_SITE_OTHER): Payer: Medicare Other | Admitting: Orthopedic Surgery

## 2021-06-29 ENCOUNTER — Encounter: Payer: Self-pay | Admitting: Orthopedic Surgery

## 2021-06-29 DIAGNOSIS — S82131A Displaced fracture of medial condyle of right tibia, initial encounter for closed fracture: Secondary | ICD-10-CM

## 2021-06-29 DIAGNOSIS — M1711 Unilateral primary osteoarthritis, right knee: Secondary | ICD-10-CM

## 2021-06-29 DIAGNOSIS — S83241D Other tear of medial meniscus, current injury, right knee, subsequent encounter: Secondary | ICD-10-CM | POA: Diagnosis not present

## 2021-06-29 NOTE — Progress Notes (Signed)
Chief Complaint  Patient presents with   Results    Review MRI right knee    I have read the MRI please see my findings below  [name] is 56 years old she had atraumatic onset of pain in her right knee she eventually had an MRI which showed she has a torn medial meniscus, osteoarthritis, subchondral bone fracture proximal medial tibia  She is symptomatic in the medial side of the joint  MRI shows torn medial meniscus with meniscal extrusion, there is also subchondral bone edema in the proximal tibia consistent with subchondral fracture  She also has osteoarthritis in this area   Formal report  IMPRESSION: 1. Radial tear of the root of the posterior horn of the medial meniscus with peripheral meniscal extrusion. 2. Nondisplaced, nondepressed subchondral fracture of the periphery of the medial tibial plateau with severe surrounding marrow edema. 3. Tricompartmental cartilage abnormalities as described above.     Electronically Signed   By: Elige Ko M.D.   On: 06/25/2021 10:57  Assessment and plan   Encounter Diagnoses  Name Primary?   Closed fracture of medial portion of right tibial plateau, initial encounter Yes   Other tear of medial meniscus of right knee as current injury, subsequent encounter    Primary osteoarthritis of right knee     Unfortunately she has had some issues with her heart will see cardiology in follow-up  That precludes any surgery at this time  Recommend brace to handle the fracture  Recommend surgical intervention at a later date when medically able  She is already on some pain medication at this time does not need any at this point for me  She is placed in a playmaker brace to unload the area and she will use a cane to help with protected weightbearing

## 2021-07-14 NOTE — Progress Notes (Signed)
Cardiology Office Note   Date:  07/15/2021   ID:  [name], Full [birth year]-[name], Kim [id number]  PCP:  [name]., MD  Cardiologist:   Rollene Rotunda, MD   Chief Complaint  Patient presents with   Chest Pain          Cardiology Office Note   Date:  07/15/2021   ID:  [name], [name], [name], [name]  PCP:  [name]., MD  Cardiologist:   Rollene Rotunda, MD   Chief Complaint  Patient presents with   Chest Pain    History of Present Illness: [name] is a 56 y.o. female who presents for follow up of coronary artery disease and history of CABG. She underwent coronary angiography in 2014 and all of her bypass grafts were patent at that time. Most recent echocardiogram performed on 06/14/2017 showed normal left ventricular systolic and diastolic function, LVEF 60 to 19%.    She had a stress perfusion study in December that demonstrated no perfusion defects.  Since she was last seen she has had problems with her knee and is going to have to have knee surgery probably arthroscopic.  She is a little bit limited in her activities though she still has to go up stairs.  She has been getting some chest discomfort.  She describes as "right."  It is mid chest.  There is no radiation.  She thinks it is somewhat similar to her previous angina but not nearly as severe.  She does not have associated nausea vomiting or diaphoresis.  She has not had palpitations, presyncope or syncope.  She does have decreased exercise tolerance and she says she is much more fatigued with activities.  She has also been bothered by flares of her asthma and does not sleep as well.  She has had some increasing problems with asthma.   Past Medical History:  Diagnosis Date   Anxiety    Asthma    Blood transfusion without reported diagnosis    2010 CABG   CAD (coronary artery disease)    4v CABG, 9/[name]; NL LVF, status post followup Cardiolite November 2012 no  ischemia ejection fraction 65%   Cataract    are forming   Diabetes mellitus    Dyslipidemia     LDL 22 mg percent on Crestor   Esophageal stricture    Fatty liver    Gastroparesis due to DM (HCC)    GERD (gastroesophageal reflux disease)    Glaucoma    Hyperlipidemia    Hypertension    Hypothyroidism    Left-sided chest wall pain    Myocardial infarction (HCC) 2010   2008 x 2 stents, 2009 x 2 stents. 01-2009 CABG x 4     Past Surgical History:  Procedure Laterality Date   ANGIOPLASTY     with stenting 2008,2009   CARDIAC SURGERY     CESAREAN SECTION     CORONARY ARTERY BYPASS GRAFT  01/26/2009   2008-2 stents; 2009-2 stents   EYE SURGERY     open heart surgery   2010   TUBAL LIGATION       Current Outpatient Medications  Medication Sig Dispense Refill   albuterol (VENTOLIN HFA) 108 (90 Base) MCG/ACT inhaler Inhale 2 puffs into the lungs every 6 (six) hours as needed for wheezing or shortness of breath. 8 g 1   ALPRAZolam (XANAX) 0.25 MG tablet Take 0.25 mg by mouth 2 (two) times daily as  needed for anxiety or sleep.   1   ammonium lactate (AMLACTIN) 12 % lotion Apply 1 application topically as needed for dry skin. 400 g 0   aspirin 81 MG chewable tablet Chew 81 mg by mouth daily.     atenolol (TENORMIN) 25 MG tablet Take 25 mg by mouth daily.     ciclopirox (PENLAC) 8 % solution APPLY TOPICALLY AT BEDTIME. APPLY OVER NAIL AND SURROUNDING SKIN. APPLY DAILY OVER PREVIOUS COAT. AFTER SEVEN (7) DAYS, MAY REMOVE WITH ALCOHOL AND CONTINUE CYCLE. 6.6 mL 0   Fluticasone-Umeclidin-Vilant (TRELEGY ELLIPTA) 100-62.5-25 MCG/INH AEPB Inhale into the lungs daily.     HYDROcodone-acetaminophen (NORCO/VICODIN) 5-325 MG tablet Take one tab po q 4 hrs prn pain 12 tablet 0   ibuprofen (ADVIL) 800 MG tablet TAKE 1 TABLET BY MOUTH EVERY 8 HOURS AS NEEDED 60 tablet 0   Insulin Human (INSULIN PUMP) SOLN Inject into the skin See admin instructions. Pt uses up to 150 units of NOVOLOG daily VIA  PUMP     lansoprazole (PREVACID SOLUTAB) 30 MG disintegrating tablet Take 1 tablet (30 mg total) by mouth daily. Please schedule an office visit for further refills. thanks 30 tablet 2   levothyroxine (SYNTHROID) 137 MCG tablet Take 137 mcg by mouth daily before breakfast.     liraglutide (VICTOZA) 18 MG/3ML SOPN Inject 1.8 mg into the skin daily.     Magnesium Oxide 500 MG CAPS Take 1 capsule by mouth every morning.     Misc. Devices (CANE) MISC 1 standard cane for standing and ambulation.  Diagnosis right knee pain. 1 each 0   nitroGLYCERIN (NITROSTAT) 0.4 MG SL tablet Place 1 tablet (0.4 mg total) under the tongue every 5 (five) minutes x 3 doses as needed for chest pain (if no relief after 2nd dose, proceed to the ED for an evaluation or call 911). 25 tablet 3   NOVOLOG 100 UNIT/ML injection Inject 100-150 Units into the skin as directed. Use up to 100-150 units daily in insulin pump as needed  5   potassium chloride (K-DUR) [name] MEQ tablet Take 1 tablet ([name] mEq total) by mouth daily. 30 tablet 6   prednisoLONE (PRELONE) 15 MG/5ML SOLN Take 15 mg by mouth 2 (two) times daily.     rosuvastatin (CRESTOR) 20 MG tablet Take 1 tablet (20 mg total) by mouth daily. 90 tablet 3   topiramate (TOPAMAX) 25 MG capsule Take 75 mg by mouth at bedtime.     triamcinolone ointment (KENALOG) 0.1 % Apply 1 application topically daily as needed.     vitamin C (ASCORBIC ACID) 500 MG tablet Take 500 mg by mouth daily.     zinc sulfate 220 (50 Zn) MG capsule Take 1 capsule (220 mg total) by mouth daily. [name] capsule 0   furosemide (LASIX) 40 MG tablet TAKE 1 TABLET BY MOUTH EVERY DAY (Patient not taking: Reported on 07/15/2021) 30 tablet 0   naproxen (NAPROSYN) 500 MG tablet Take 1 tablet (500 mg total) by mouth 2 (two) times daily. Take with food (Patient not taking: Reported on 07/15/2021) 30 tablet 0   Pseudoeph-Doxylamine-DM-APAP (NYQUIL PO) Take 30 mLs by mouth at bedtime as needed (for cough). (Patient not taking:  Reported on 07/15/2021)     No current facility-administered medications for this visit.    Allergies:   Metformin, Penicillins, and Metoclopramide    ROS:  Please see the history of present illness.   Otherwise, review of systems are positive for none.  All other systems are reviewed and negative.    PHYSICAL EXAM: VS:  BP 112/60    Pulse 70    Ht 5\' 4"  (1.626 m)    Wt 248 lb 3.2 oz (112.6 kg)    LMP 09/22/2019    SpO2 96%    BMI 42.60 kg/m  , BMI Body mass index is 42.6 kg/m. GENERAL:  Well appearing NECK:  No jugular venous distention, waveform within normal limits, carotid upstroke brisk and symmetric, no bruits, no thyromegaly LYMPHATICS:  No cervical, inguinal adenopathy LUNGS: Few decreased breath sounds with fine expiratory wheezes. BACK:  No CVA tenderness CHEST:  Well healed sternotomy scar. HEART:  PMI not displaced or sustained,S1 and S2 within normal limits, no S3, no S4, no clicks, no rubs, no murmurs ABD:  Flat, positive bowel sounds normal in frequency in pitch, no bruits, no rebound, no guarding, no midline pulsatile mass, no hepatomegaly, no splenomegaly EXT:  2 plus pulses throughout, no edema, no cyanosis no clubbing    EKG:  EKG is ordered today. The ekg ordered today demonstrates sinus rhythm, rate 70, axis within normal limits, intervals within normal limits, no acute ST-T wave changes.   Recent Labs: No results found for requested labs within last 8760 hours.    Lipid Panel    Component Value Date/Time   CHOL 211 (H) 11/16/2020 1102   TRIG 122 11/16/2020 1102   HDL 56 11/16/2020 1102   CHOLHDL 3.8 11/16/2020 1102   CHOLHDL 5.4 01/24/2009 0400   VLDL 36 01/24/2009 0400   LDLCALC 133 (H) 11/16/2020 1102      Wt Readings from Last 3 Encounters:  07/15/21 248 lb 3.2 oz (112.6 kg)  05/25/21 254 lb 6.4 oz (115.4 kg)  05/18/21 250 lb (113.4 kg)      Other studies Reviewed: Additional studies/ records that were reviewed today include:  Labs. Review of the above records demonstrates:  Please see elsewhere in the note.     ASSESSMENT AND PLAN:  Preop: The patient is to have preop clearance but she does have some symptoms.  She has an extensive coronary disease history.  She has a low functional level.  Given all of this preoperative testing is indicated according to ACC/AHA guidelines.  She would be able to walk on a treadmill so she will have to have a [employer] Myoview.  CAD: This will be evaluated as above.  She needs aggressive risk reduction but she has not been able to participate in this.  HTN: Her blood pressure is at target.  She will continue the meds as listed.  Dyslipidemia: She was seen in our [facility] and I will get her reenrolled.  She was encouraged to pressure Crestor in applesauce but she could not even do that.  She cannot take an oral statin by her report.  I will try to get her on a PCSK9 inhibitor.  Her LDL is 133 certainly contributing to her significant risk of recurrent events.  DM: I will defer to her endocrinologist but unfortunately her A1c remains above 9.  She understands diet suggestions   Current medicines are reviewed at length with the patient today.  The patient does not have concerns regarding medicines.  The following changes have been made:  no change  Labs/ tests ordered today include:   Orders Placed This Encounter  Procedures   AMB Referral to [facility]   MYOCARDIAL PERFUSION IMAGING   EKG 12-Lead     Disposition:   FU  with in 1 year or sooner if needed   Signed, Rollene Rotunda, MD  07/15/2021 2:24 PM    [facility]

## 2021-07-15 ENCOUNTER — Other Ambulatory Visit: Payer: Self-pay | Admitting: Gastroenterology

## 2021-07-15 ENCOUNTER — Encounter: Payer: Self-pay | Admitting: Cardiology

## 2021-07-15 ENCOUNTER — Other Ambulatory Visit: Payer: Self-pay

## 2021-07-15 ENCOUNTER — Ambulatory Visit (INDEPENDENT_AMBULATORY_CARE_PROVIDER_SITE_OTHER): Payer: Medicare Other | Admitting: Cardiology

## 2021-07-15 VITALS — BP 112/60 | HR 70 | Ht 64.0 in | Wt 248.2 lb

## 2021-07-15 DIAGNOSIS — I10 Essential (primary) hypertension: Secondary | ICD-10-CM

## 2021-07-15 DIAGNOSIS — Z01810 Encounter for preprocedural cardiovascular examination: Secondary | ICD-10-CM

## 2021-07-15 DIAGNOSIS — E785 Hyperlipidemia, unspecified: Secondary | ICD-10-CM

## 2021-07-15 DIAGNOSIS — I25810 Atherosclerosis of coronary artery bypass graft(s) without angina pectoris: Secondary | ICD-10-CM

## 2021-07-15 DIAGNOSIS — E118 Type 2 diabetes mellitus with unspecified complications: Secondary | ICD-10-CM

## 2021-07-15 NOTE — Patient Instructions (Addendum)
Medication Instructions:  No Changes In Medications at this time.  *If you need a refill on your cardiac medications before your next appointment, please call your pharmacy*  Testing/Procedures: Your physician has requested that you have a lexiscan myoview. For further information please visit [email or web address] Please follow instruction sheet, as given. This will take place at [address]  Follow-Up: At [facility], you and your health needs are our priority.  As part of our continuing mission to provide you with exceptional heart care, we have created designated Provider Care Teams.  These Care Teams include your primary Cardiologist (physician) and Advanced Practice Providers (APPs -  Physician Assistants and Nurse Practitioners) who all work together to provide you with the care you need, when you need it.  We recommend signing up for the patient portal called "MyChart".  Sign up information is provided on this After Visit Summary.  MyChart is used to connect with patients for Virtual Visits (Telemedicine).  Patients are able to view lab/test results, encounter notes, upcoming appointments, etc.  Non-urgent messages can be sent to your provider as well.   To learn more about what you can do with MyChart, go to [email or web address].    PLEASE SCHEDULE APPOINTMENT WITH [facility] AT NEXT AVAILABLE FOR CHOLESTEROL MANAGEMENT  Your next appointment:   1 year(s)  The format for your next appointment:   In Person  Provider:   Rollene Rotunda, MD

## 2021-07-15 NOTE — Progress Notes (Deleted)
Cardiology Office Note   Date:  07/15/2021   ID:  Elizebeth KollerVirginia [name], DOB 04-25-1966, MRN [id number]  PCP:  Frederich ChickFoster, Robert M Jr., MD  Cardiologist:   Rollene RotundaJames Takyah Ciaramitaro, MD Referring:  ***  Chief Complaint  Patient presents with   Chest Pain      History of Present Illness: [name] is a 56 y.o. female who presents for ***     Past Medical History:  Diagnosis Date   Anxiety    Asthma    Blood transfusion without reported diagnosis    2010 CABG   CAD (coronary artery disease)    4v CABG, 9/10; NL LVF, status post followup Cardiolite November 2012 no ischemia ejection fraction 65%   Cataract    are forming   Diabetes mellitus    Dyslipidemia     LDL 22 mg percent on Crestor   Esophageal stricture    Fatty liver    Gastroparesis due to DM (HCC)    GERD (gastroesophageal reflux disease)    Glaucoma    Hyperlipidemia    Hypertension    Hypothyroidism    Left-sided chest wall pain    Myocardial infarction (HCC) 2010   2008 x 2 stents, 2009 x 2 stents. 01-2009 CABG x 4     Past Surgical History:  Procedure Laterality Date   ANGIOPLASTY     with stenting 2008,2009   CARDIAC SURGERY     CESAREAN SECTION     CORONARY ARTERY BYPASS GRAFT  01/26/2009   2008-2 stents; 2009-2 stents   EYE SURGERY     open heart surgery   2010   TUBAL LIGATION       Current Outpatient Medications  Medication Sig Dispense Refill   albuterol (VENTOLIN HFA) 108 (90 Base) MCG/ACT inhaler Inhale 2 puffs into the lungs every 6 (six) hours as needed for wheezing or shortness of breath. 8 g 1   ALPRAZolam (XANAX) 0.25 MG tablet Take 0.25 mg by mouth 2 (two) times daily as needed for anxiety or sleep.   1   ammonium lactate (AMLACTIN) 12 % lotion Apply 1 application topically as needed for dry skin. 400 g 0   aspirin 81 MG chewable tablet Chew 81 mg by mouth daily.     atenolol (TENORMIN) 25 MG tablet Take 25 mg by mouth daily.     ciclopirox (PENLAC) 8 % solution APPLY TOPICALLY AT  BEDTIME. APPLY OVER NAIL AND SURROUNDING SKIN. APPLY DAILY OVER PREVIOUS COAT. AFTER SEVEN (7) DAYS, MAY REMOVE WITH ALCOHOL AND CONTINUE CYCLE. 6.6 mL 0   Fluticasone-Umeclidin-Vilant (TRELEGY ELLIPTA) 100-62.5-25 MCG/INH AEPB Inhale into the lungs daily.     HYDROcodone-acetaminophen (NORCO/VICODIN) 5-325 MG tablet Take one tab po q 4 hrs prn pain 12 tablet 0   ibuprofen (ADVIL) 800 MG tablet TAKE 1 TABLET BY MOUTH EVERY 8 HOURS AS NEEDED 60 tablet 0   Insulin Human (INSULIN PUMP) SOLN Inject into the skin See admin instructions. Pt uses up to 150 units of NOVOLOG daily VIA PUMP     lansoprazole (PREVACID SOLUTAB) 30 MG disintegrating tablet Take 1 tablet (30 mg total) by mouth daily. Please schedule an office visit for further refills. thanks 30 tablet 2   levothyroxine (SYNTHROID) 137 MCG tablet Take 137 mcg by mouth daily before breakfast.     liraglutide (VICTOZA) 18 MG/3ML SOPN Inject 1.8 mg into the skin daily.     Magnesium Oxide 500 MG CAPS Take 1 capsule by mouth every morning.  Misc. Devices (CANE) MISC 1 standard cane for standing and ambulation.  Diagnosis right knee pain. 1 each 0   nitroGLYCERIN (NITROSTAT) 0.4 MG SL tablet Place 1 tablet (0.4 mg total) under the tongue every 5 (five) minutes x 3 doses as needed for chest pain (if no relief after 2nd dose, proceed to the ED for an evaluation or call 911). 25 tablet 3   NOVOLOG 100 UNIT/ML injection Inject 100-150 Units into the skin as directed. Use up to 100-150 units daily in insulin pump as needed  5   potassium chloride (K-DUR) 10 MEQ tablet Take 1 tablet (10 mEq total) by mouth daily. 30 tablet 6   prednisoLONE (PRELONE) 15 MG/5ML SOLN Take 15 mg by mouth 2 (two) times daily.     rosuvastatin (CRESTOR) 20 MG tablet Take 1 tablet (20 mg total) by mouth daily. 90 tablet 3   topiramate (TOPAMAX) 25 MG capsule Take 75 mg by mouth at bedtime.     triamcinolone ointment (KENALOG) 0.1 % Apply 1 application topically daily as needed.      vitamin C (ASCORBIC ACID) 500 MG tablet Take 500 mg by mouth daily.     zinc sulfate 220 (50 Zn) MG capsule Take 1 capsule (220 mg total) by mouth daily. 10 capsule 0   furosemide (LASIX) 40 MG tablet TAKE 1 TABLET BY MOUTH EVERY DAY (Patient not taking: Reported on 07/15/2021) 30 tablet 0   naproxen (NAPROSYN) 500 MG tablet Take 1 tablet (500 mg total) by mouth 2 (two) times daily. Take with food (Patient not taking: Reported on 07/15/2021) 30 tablet 0   Pseudoeph-Doxylamine-DM-APAP (NYQUIL PO) Take 30 mLs by mouth at bedtime as needed (for cough). (Patient not taking: Reported on 07/15/2021)     No current facility-administered medications for this visit.    Allergies:   Metformin, Penicillins, and Metoclopramide    Social History:  The patient  reports that she quit smoking about 28 years ago. Her smoking use included cigarettes. She started smoking about 30 years ago. She has a 3.00 pack-year smoking history. She has never used smokeless tobacco. She reports that she does not drink alcohol and does not use drugs.   Family History:  The patient's ***family history includes Asthma in her father, paternal aunt, and son; COPD in her father; Diabetes in her mother; Heart disease in her father.    ROS:  Please see the history of present illness.   Otherwise, review of systems are positive for {NONE DEFAULTED:18576}.   All other systems are reviewed and negative.    PHYSICAL EXAM: VS:  BP 112/60    Pulse 70    Ht 5\' 4"  (1.626 m)    Wt 248 lb 3.2 oz (112.6 kg)    LMP 09/22/2019    SpO2 96%    BMI 42.60 kg/m  , BMI Body mass index is 42.6 kg/m. GENERAL:  Well appearing HEENT:  Pupils equal round and reactive, fundi not visualized, oral mucosa unremarkable NECK:  No jugular venous distention, waveform within normal limits, carotid upstroke brisk and symmetric, no bruits, no thyromegaly LYMPHATICS:  No cervical, inguinal adenopathy LUNGS:  Clear to auscultation bilaterally BACK:  No CVA  tenderness CHEST:  Unremarkable HEART:  PMI not displaced or sustained,S1 and S2 within normal limits, no S3, no S4, no clicks, no rubs, *** murmurs ABD:  Flat, positive bowel sounds normal in frequency in pitch, no bruits, no rebound, no guarding, no midline pulsatile mass, no hepatomegaly, no splenomegaly EXT:  2 plus pulses throughout, no edema, no cyanosis no clubbing SKIN:  No rashes no nodules NEURO:  Cranial nerves II through XII grossly intact, motor grossly intact throughout PSYCH:  Cognitively intact, oriented to person place and time    EKG:  EKG {ACTION; IS/IS [id number]} ordered today. The ekg ordered today demonstrates ***   Recent Labs: No results found for requested labs within last 8760 hours.    Lipid Panel    Component Value Date/Time   CHOL 211 (H) 11/16/2020 1102   TRIG 122 11/16/2020 1102   HDL 56 11/16/2020 1102   CHOLHDL 3.8 11/16/2020 1102   CHOLHDL 5.4 01/24/2009 0400   VLDL 36 01/24/2009 0400   LDLCALC 133 (H) 11/16/2020 1102      Wt Readings from Last 3 Encounters:  07/15/21 248 lb 3.2 oz (112.6 kg)  05/25/21 254 lb 6.4 oz (115.4 kg)  05/18/21 250 lb (113.4 kg)      Other studies Reviewed: Additional studies/ records that were reviewed today include: ***. Review of the above records demonstrates:  Please see elsewhere in the note.  ***   ASSESSMENT AND PLAN:  ***   Current medicines are reviewed at length with the patient today.  The patient {ACTIONS; HAS/DOES NOT HAVE:19233} concerns regarding medicines.  The following changes have been made:  {PLAN; NO CHANGE:13088:s}  Labs/ tests ordered today include: ***  Orders Placed This Encounter  Procedures   AMB Referral to [facility]   MYOCARDIAL PERFUSION IMAGING   EKG 12-Lead     Disposition:   FU with ***    Signed, Rollene Rotunda, MD  07/15/2021 2:15 PM     [facility]      Cardiology Office Note   Date:  07/15/2021   ID:  Brae, Schaafsma [birth year], MRN [id number]  PCP:  Frederich Chick., MD  Cardiologist:   Rollene Rotunda, MD Referring:  ***  Chief Complaint  Patient presents with   Chest Pain      History of Present Illness: [name] is a 56 y.o. female who presents for ***     Past Medical History:  Diagnosis Date   Anxiety    Asthma    Blood transfusion without reported diagnosis    2010 CABG   CAD (coronary artery disease)    4v CABG, 9/10; NL LVF, status post followup Cardiolite November 2012 no ischemia ejection fraction 65%   Cataract    are forming   Diabetes mellitus    Dyslipidemia     LDL 22 mg percent on Crestor   Esophageal stricture    Fatty liver    Gastroparesis due to DM (HCC)    GERD (gastroesophageal reflux disease)    Glaucoma    Hyperlipidemia    Hypertension    Hypothyroidism    Left-sided chest wall pain    Myocardial infarction (HCC) 2010   2008 x 2 stents, 2009 x 2 stents. 01-2009 CABG x 4     Past Surgical History:  Procedure Laterality Date   ANGIOPLASTY     with stenting 2008,2009   CARDIAC SURGERY     CESAREAN SECTION     CORONARY ARTERY BYPASS GRAFT  01/26/2009   2008-2 stents; 2009-2 stents   EYE SURGERY     open heart surgery   2010   TUBAL LIGATION       Current Outpatient Medications  Medication Sig Dispense Refill   albuterol (VENTOLIN HFA) 108 (90 Base)  MCG/ACT inhaler Inhale 2 puffs into the lungs every 6 (six) hours as needed for wheezing or shortness of breath. 8 g 1   ALPRAZolam (XANAX) 0.25 MG tablet Take 0.25 mg by mouth 2 (two) times daily as needed for anxiety or sleep.   1   ammonium lactate (AMLACTIN) 12 % lotion Apply 1 application topically as needed for dry skin. 400 g 0   aspirin 81 MG chewable tablet Chew 81 mg by mouth daily.     atenolol (TENORMIN) 25 MG tablet Take 25 mg by mouth daily.     ciclopirox (PENLAC) 8 % solution APPLY TOPICALLY AT BEDTIME. APPLY OVER NAIL AND SURROUNDING SKIN. APPLY DAILY OVER PREVIOUS  COAT. AFTER SEVEN (7) DAYS, MAY REMOVE WITH ALCOHOL AND CONTINUE CYCLE. 6.6 mL 0   Fluticasone-Umeclidin-Vilant (TRELEGY ELLIPTA) 100-62.5-25 MCG/INH AEPB Inhale into the lungs daily.     HYDROcodone-acetaminophen (NORCO/VICODIN) 5-325 MG tablet Take one tab po q 4 hrs prn pain 12 tablet 0   ibuprofen (ADVIL) 800 MG tablet TAKE 1 TABLET BY MOUTH EVERY 8 HOURS AS NEEDED 60 tablet 0   Insulin Human (INSULIN PUMP) SOLN Inject into the skin See admin instructions. Pt uses up to 150 units of NOVOLOG daily VIA PUMP     lansoprazole (PREVACID SOLUTAB) 30 MG disintegrating tablet Take 1 tablet (30 mg total) by mouth daily. Please schedule an office visit for further refills. thanks 30 tablet 2   levothyroxine (SYNTHROID) 137 MCG tablet Take 137 mcg by mouth daily before breakfast.     liraglutide (VICTOZA) 18 MG/3ML SOPN Inject 1.8 mg into the skin daily.     Magnesium Oxide 500 MG CAPS Take 1 capsule by mouth every morning.     Misc. Devices (CANE) MISC 1 standard cane for standing and ambulation.  Diagnosis right knee pain. 1 each 0   nitroGLYCERIN (NITROSTAT) 0.4 MG SL tablet Place 1 tablet (0.4 mg total) under the tongue every 5 (five) minutes x 3 doses as needed for chest pain (if no relief after 2nd dose, proceed to the ED for an evaluation or call 911). 25 tablet 3   NOVOLOG 100 UNIT/ML injection Inject 100-150 Units into the skin as directed. Use up to 100-150 units daily in insulin pump as needed  5   potassium chloride (K-DUR) 10 MEQ tablet Take 1 tablet (10 mEq total) by mouth daily. 30 tablet 6   prednisoLONE (PRELONE) 15 MG/5ML SOLN Take 15 mg by mouth 2 (two) times daily.     rosuvastatin (CRESTOR) 20 MG tablet Take 1 tablet (20 mg total) by mouth daily. 90 tablet 3   topiramate (TOPAMAX) 25 MG capsule Take 75 mg by mouth at bedtime.     triamcinolone ointment (KENALOG) 0.1 % Apply 1 application topically daily as needed.     vitamin C (ASCORBIC ACID) 500 MG tablet Take 500 mg by mouth daily.      zinc sulfate 220 (50 Zn) MG capsule Take 1 capsule (220 mg total) by mouth daily. 10 capsule 0   furosemide (LASIX) 40 MG tablet TAKE 1 TABLET BY MOUTH EVERY DAY (Patient not taking: Reported on 07/15/2021) 30 tablet 0   naproxen (NAPROSYN) 500 MG tablet Take 1 tablet (500 mg total) by mouth 2 (two) times daily. Take with food (Patient not taking: Reported on 07/15/2021) 30 tablet 0   Pseudoeph-Doxylamine-DM-APAP (NYQUIL PO) Take 30 mLs by mouth at bedtime as needed (for cough). (Patient not taking: Reported on 07/15/2021)     No  current facility-administered medications for this visit.    Allergies:   Metformin, Penicillins, and Metoclopramide    Social History:  The patient  reports that she quit smoking about 28 years ago. Her smoking use included cigarettes. She started smoking about 30 years ago. She has a 3.00 pack-year smoking history. She has never used smokeless tobacco. She reports that she does not drink alcohol and does not use drugs.   Family History:  The patient's ***family history includes Asthma in her father, paternal aunt, and son; COPD in her father; Diabetes in her mother; Heart disease in her father.    ROS:  Please see the history of present illness.   Otherwise, review of systems are positive for {NONE DEFAULTED:18576}.   All other systems are reviewed and negative.    PHYSICAL EXAM: VS:  BP 112/60    Pulse 70    Ht 5\' 4"  (1.626 m)    Wt 248 lb 3.2 oz (112.6 kg)    LMP 09/22/2019    SpO2 96%    BMI 42.60 kg/m  , BMI Body mass index is 42.6 kg/m. GENERAL:  Well appearing HEENT:  Pupils equal round and reactive, fundi not visualized, oral mucosa unremarkable NECK:  No jugular venous distention, waveform within normal limits, carotid upstroke brisk and symmetric, no bruits, no thyromegaly LYMPHATICS:  No cervical, inguinal adenopathy LUNGS:  Clear to auscultation bilaterally BACK:  No CVA tenderness CHEST:  Unremarkable HEART:  PMI not displaced or sustained,S1 and  S2 within normal limits, no S3, no S4, no clicks, no rubs, *** murmurs ABD:  Flat, positive bowel sounds normal in frequency in pitch, no bruits, no rebound, no guarding, no midline pulsatile mass, no hepatomegaly, no splenomegaly EXT:  2 plus pulses throughout, no edema, no cyanosis no clubbing SKIN:  No rashes no nodules NEURO:  Cranial nerves II through XII grossly intact, motor grossly intact throughout PSYCH:  Cognitively intact, oriented to person place and time    EKG:  EKG {ACTION; IS/IS [id number]} ordered today. The ekg ordered today demonstrates ***   Recent Labs: No results found for requested labs within last 8760 hours.    Lipid Panel    Component Value Date/Time   CHOL 211 (H) 11/16/2020 1102   TRIG 122 11/16/2020 1102   HDL 56 11/16/2020 1102   CHOLHDL 3.8 11/16/2020 1102   CHOLHDL 5.4 01/24/2009 0400   VLDL 36 01/24/2009 0400   LDLCALC 133 (H) 11/16/2020 1102      Wt Readings from Last 3 Encounters:  07/15/21 248 lb 3.2 oz (112.6 kg)  05/25/21 254 lb 6.4 oz (115.4 kg)  05/18/21 250 lb (113.4 kg)      Other studies Reviewed: Additional studies/ records that were reviewed today include: ***. Review of the above records demonstrates:  Please see elsewhere in the note.  ***   ASSESSMENT AND PLAN:  ***   Current medicines are reviewed at length with the patient today.  The patient {ACTIONS; HAS/DOES NOT HAVE:19233} concerns regarding medicines.  The following changes have been made:  {PLAN; NO CHANGE:13088:s}  Labs/ tests ordered today include: ***  Orders Placed This Encounter  Procedures   [facility]   MYOCARDIAL PERFUSION IMAGING   EKG 12-Lead     Disposition:   FU with ***    Signed, Rollene RotundaJames Daiden Coltrane, MD  07/15/2021 2:15 PM    [facility]

## 2021-07-18 ENCOUNTER — Telehealth: Payer: Self-pay

## 2021-07-18 DIAGNOSIS — E785 Hyperlipidemia, unspecified: Secondary | ICD-10-CM

## 2021-07-18 MED ORDER — PRALUENT 150 MG/ML ~~LOC~~ SOAJ: 150.0000 mg | SUBCUTANEOUS | 11 refills | Status: DC

## 2021-07-18 NOTE — Telephone Encounter (Signed)
Called and unable to lmom pt that praluent was approved, rx sent, and unable to let them know to complete post 4th dose labs, and to let us know if the medication is cost prohibitive.

## 2021-07-19 NOTE — Addendum Note (Signed)
Addended by: Bea Laura B on: 07/19/2021 08:36 AM   Modules accepted: Orders

## 2021-07-19 NOTE — Addendum Note (Signed)
Addended by: Rollene Rotunda on: 07/19/2021 10:23 AM   Modules accepted: Orders

## 2021-07-20 ENCOUNTER — Telehealth (HOSPITAL_COMMUNITY): Payer: Self-pay | Admitting: *Deleted

## 2021-07-20 NOTE — Telephone Encounter (Signed)
Attempted to leave a message on voicemail in reference to upcoming appointment scheduled for 07/27/21 but no answer and voice mailbox full.[name], [name] ?No mychart ? ?

## 2021-07-27 ENCOUNTER — Ambulatory Visit (HOSPITAL_COMMUNITY): Payer: Medicare Other | Attending: Cardiology

## 2021-07-27 ENCOUNTER — Other Ambulatory Visit: Payer: Self-pay

## 2021-07-27 DIAGNOSIS — Z01810 Encounter for preprocedural cardiovascular examination: Secondary | ICD-10-CM | POA: Insufficient documentation

## 2021-07-27 DIAGNOSIS — I25810 Atherosclerosis of coronary artery bypass graft(s) without angina pectoris: Secondary | ICD-10-CM | POA: Diagnosis not present

## 2021-07-27 DIAGNOSIS — E785 Hyperlipidemia, unspecified: Secondary | ICD-10-CM | POA: Diagnosis present

## 2021-07-27 MED ORDER — TECHNETIUM TC 99M TETROFOSMIN IV KIT
30.5000 | PACK | Freq: Once | INTRAVENOUS | Status: AC | PRN
  Administered 2021-07-27: 30.5 via INTRAVENOUS
  Filled 2021-07-27: qty 31

## 2021-07-27 MED ORDER — REGADENOSON 0.4 MG/5ML IV SOLN
0.4000 mg | Freq: Once | INTRAVENOUS | Status: AC
  Administered 2021-07-27: 0.4 mg via INTRAVENOUS

## 2021-07-28 ENCOUNTER — Ambulatory Visit (HOSPITAL_COMMUNITY): Payer: Medicare Other | Attending: Internal Medicine

## 2021-07-28 LAB — MYOCARDIAL PERFUSION IMAGING
LV dias vol: 22 mL (ref 46–106)
LV sys vol: 80 mL
Nuc Stress EF: 72 %
Peak HR: 114 {beats}/min
Rest HR: 92 {beats}/min
Rest Nuclear Isotope Dose: 30.6 mCi
SDS: 4
SRS: 0
SSS: 4
ST Depression (mm): 0 mm
Stress Nuclear Isotope Dose: 30.5 mCi
TID: 0.93

## 2021-07-28 MED ORDER — TECHNETIUM TC 99M TETROFOSMIN IV KIT
30.6000 | PACK | Freq: Once | INTRAVENOUS | Status: AC | PRN
  Administered 2021-07-28: 30.6 via INTRAVENOUS
  Filled 2021-07-28: qty 31

## 2021-08-08 ENCOUNTER — Other Ambulatory Visit: Payer: Self-pay | Admitting: Gastroenterology

## 2021-08-08 ENCOUNTER — Telehealth: Payer: Self-pay

## 2021-08-08 NOTE — Telephone Encounter (Signed)
Lmom the pt that they shouldn't need another lipid appt as they should already be approved for the praluent and to call and let us know if they are having issues or if they would like to schedule.  ?

## 2021-08-10 ENCOUNTER — Ambulatory Visit: Payer: Medicare Other

## 2021-08-11 ENCOUNTER — Ambulatory Visit: Payer: Medicare Other | Admitting: Cardiology

## 2021-08-12 ENCOUNTER — Telehealth: Payer: Self-pay | Admitting: Gastroenterology

## 2021-08-12 MED ORDER — LANSOPRAZOLE 30 MG PO TBDD: 30.0000 mg | DELAYED_RELEASE_TABLET | Freq: Every day | ORAL | 0 refills | Status: DC

## 2021-08-12 NOTE — Telephone Encounter (Signed)
Inbound call from patient requesting a refill for Prevacid Solutab sent to [facility] in [place]. Patient have OV with APP 4/3 ?

## 2021-08-12 NOTE — Telephone Encounter (Signed)
30 day supply sent to pharmacy. Patient must keep appointment for further refills. ?

## 2021-08-22 ENCOUNTER — Ambulatory Visit (INDEPENDENT_AMBULATORY_CARE_PROVIDER_SITE_OTHER): Payer: Medicare Other | Admitting: Physician Assistant

## 2021-08-22 ENCOUNTER — Telehealth: Payer: Self-pay

## 2021-08-22 ENCOUNTER — Encounter: Payer: Self-pay | Admitting: Physician Assistant

## 2021-08-22 VITALS — BP 118/64 | HR 73 | Ht 66.0 in | Wt 243.6 lb

## 2021-08-22 DIAGNOSIS — K219 Gastro-esophageal reflux disease without esophagitis: Secondary | ICD-10-CM

## 2021-08-22 DIAGNOSIS — Z86010 Personal history of colonic polyps: Secondary | ICD-10-CM | POA: Diagnosis not present

## 2021-08-22 DIAGNOSIS — I25810 Atherosclerosis of coronary artery bypass graft(s) without angina pectoris: Secondary | ICD-10-CM

## 2021-08-22 MED ORDER — LANSOPRAZOLE 30 MG PO TBDD: 30.0000 mg | DELAYED_RELEASE_TABLET | Freq: Every day | ORAL | 3 refills | Status: DC

## 2021-08-22 MED ORDER — PLENVU 140 G PO SOLR: 1.0000 | ORAL | 0 refills | Status: DC

## 2021-08-22 NOTE — Patient Instructions (Signed)
If you are age 55 or younger, your body mass index should be between 19-25. Your Body mass index is 39.32 kg/m?Marland Kitchen If this is out of the aformentioned range listed, please consider follow up with your Primary Care Provider.  ?________________________________________________________ ? ?The [facility] providers would like to encourage you to use [facility] to communicate with providers for non-urgent requests or questions.  Due to long hold times on the telephone, sending your provider a message by [facility] may be a faster and more efficient way to get a response.  Please allow 48 business hours for a response.  Please remember that this is for non-urgent requests.  ?_______________________________________________________ ? ?You have been scheduled for a colonoscopy. Please follow written instructions given to you at your visit today.  ?Please pick up your prep supplies at the pharmacy within the next 1-3 days. ?If you use inhalers (even only as needed), please bring them with you on the day of your procedure. ? ?Continue Prevacid 1 before breakfast ? ?Thank you for entrusting me with your care and choosing [facility]. ? ?Mike Gip, PA-C ?

## 2021-08-22 NOTE — Progress Notes (Signed)
Agree with assessment and plan as outlined.  

## 2021-08-22 NOTE — Progress Notes (Signed)
? ?Subjective:  ? ? Patient ID: [name], female    DOB: 03/27/1966, 56 y.o.   MRN: [id number] ? ?HPI ?[place] is a pleasant 56 year old female, established with Dr. Havery Moros.  She was last seen here in 2018 when she underwent EGD.  She has history of chronic GERD, and has had prior esophageal dilations. ?At EGD in 2018 she had a 2 cm hiatal hernia, no stricture or ring appreciated but due to symptoms of dysphagia she had empiric dilation to 18 mm. ?She is maintained on chronic Prevacid 30 mg daily. ?She comes in today for medication refills, and also has questions about repeat EGD.  She is asking whether she is due to have another endoscopy. ?She says as long as she stays on the Prevacid she does fine and has no symptoms of acid reflux heartburn or indigestion.  No current complaints of dysphagia or odynophagia.  She has been on Vick toes over the past 3 months and has lost about 10 pounds and says that some of her upper abdominal bloating symptoms have resolved.  She does carry diagnosis of gastroparesis, has not been on medication and does not have any current symptoms of nausea vomiting or early satiety. ?She did have prior colonoscopy done in [place] about 3 years ago She says that she had colon polyps and was told she should have a follow-up in 3 years.  I do not have copies of those reports. ?Patient has history of insulin-dependent diabetes mellitus, insulin pump in place, hypothyroidism, asthma, anxiety, coronary artery disease status post remote stents and then CABG.  No current antiplatelets or blood thinners, and also with history of hypertension. ? ?Review of Systems.Pertinent positive and negative review of systems were noted in the above HPI section.  All other review of systems was otherwise negative.  ? ?Outpatient Encounter Medications as of 08/22/2021  ?Medication Sig  ? albuterol (VENTOLIN HFA) 108 (90 Base) MCG/ACT inhaler Inhale 2 puffs into the lungs every 6 (six) hours as  needed for wheezing or shortness of breath.  ? Alirocumab (PRALUENT) 150 MG/ML SOAJ Inject 150 mg into the skin every 14 (fourteen) days.  ? ALPRAZolam (XANAX) 0.25 MG tablet Take 0.25 mg by mouth 2 (two) times daily as needed for anxiety or sleep.   ? ammonium lactate (AMLACTIN) 12 % lotion Apply 1 application topically as needed for dry skin.  ? aspirin 81 MG chewable tablet Chew 81 mg by mouth daily.  ? atenolol (TENORMIN) 25 MG tablet Take 25 mg by mouth daily.  ? ciclopirox (PENLAC) 8 % solution APPLY TOPICALLY AT BEDTIME. APPLY OVER NAIL AND SURROUNDING SKIN. APPLY DAILY OVER PREVIOUS COAT. AFTER SEVEN (7) DAYS, MAY REMOVE WITH ALCOHOL AND CONTINUE CYCLE.  ? Fluticasone-Umeclidin-Vilant (TRELEGY ELLIPTA) 100-62.5-25 MCG/INH AEPB Inhale into the lungs daily.  ? furosemide (LASIX) 40 MG tablet TAKE 1 TABLET BY MOUTH EVERY DAY  ? HYDROcodone-acetaminophen (NORCO/VICODIN) 5-325 MG tablet Take one tab po q 4 hrs prn pain  ? ibuprofen (ADVIL) 800 MG tablet TAKE 1 TABLET BY MOUTH EVERY 8 HOURS AS NEEDED  ? Insulin Human (INSULIN PUMP) SOLN Inject into the skin See admin instructions. Pt uses up to 150 units of NOVOLOG daily VIA PUMP  ? levothyroxine (SYNTHROID) 137 MCG tablet Take 137 mcg by mouth daily before breakfast.  ? liraglutide (VICTOZA) 18 MG/3ML SOPN Inject 1.8 mg into the skin daily.  ? Magnesium Oxide 500 MG CAPS Take 1 capsule by mouth every morning.  ? Misc. Devices (  CANE) MISC 1 standard cane for standing and ambulation.  Diagnosis right knee pain.  ? nitroGLYCERIN (NITROSTAT) 0.4 MG SL tablet Place 1 tablet (0.4 mg total) under the tongue every 5 (five) minutes x 3 doses as needed for chest pain (if no relief after 2nd dose, proceed to the ED for an evaluation or call 911).  ? NOVOLOG 100 UNIT/ML injection Inject 100-150 Units into the skin as directed. Use up to 100-150 units daily in insulin pump as needed  ? PEG-KCl-NaCl-NaSulf-Na Asc-C (PLENVU) 140 g SOLR Take 1 kit by mouth as directed.  ?  potassium chloride (K-DUR) 10 MEQ tablet Take 1 tablet (10 mEq total) by mouth daily.  ? prednisoLONE (PRELONE) 15 MG/5ML SOLN Take 15 mg by mouth 2 (two) times daily.  ? Pseudoeph-Doxylamine-DM-APAP (NYQUIL PO) Take 30 mLs by mouth at bedtime as needed (for cough).  ? topiramate (TOPAMAX) 25 MG capsule Take 75 mg by mouth at bedtime.  ? triamcinolone ointment (KENALOG) 0.1 % Apply 1 application topically daily as needed.  ? vitamin C (ASCORBIC ACID) 500 MG tablet Take 500 mg by mouth daily.  ? zinc sulfate 220 (50 Zn) MG capsule Take 1 capsule (220 mg total) by mouth daily.  ? [DISCONTINUED] lansoprazole (PREVACID SOLUTAB) 30 MG disintegrating tablet Take 1 tablet (30 mg total) by mouth daily. Please keep appointment for further refills  ? lansoprazole (PREVACID SOLUTAB) 30 MG disintegrating tablet Take 1 tablet (30 mg total) by mouth daily.  ? naproxen (NAPROSYN) 500 MG tablet Take 1 tablet (500 mg total) by mouth 2 (two) times daily. Take with food (Patient not taking: Reported on 08/22/2021)  ? rosuvastatin (CRESTOR) 20 MG tablet Take 1 tablet (20 mg total) by mouth daily.  ? ?No facility-administered encounter medications on file as of 08/22/2021.  ? ?Allergies  ?Allergen Reactions  ? Metformin Anaphylaxis  ? Penicillins Anaphylaxis and Other (See Comments)  ?  Has patient had a PCN reaction causing immediate rash, facial/tongue/throat swelling, SOB or lightheadedness with hypotension: Yes ?Has patient had a PCN reaction causing severe rash involving mucus membranes or skin necrosis: No ?Has patient had a PCN reaction that required hospitalization No ?Has patient had a PCN reaction occurring within the last 10 years: No ?If all of the above answers are "NO", then may proceed with Cephalosporin use.  ? Metoclopramide Other (See Comments)  ?  Reaction:  Nightmares   ? ?Patient Active Problem List  ? Diagnosis Date Noted  ? Screening mammogram for breast cancer 12/14/2020  ? Encounter for screening fecal occult blood  testing 12/14/2020  ? Post-menopause 12/14/2020  ? Encounter for well woman exam with routine gynecological exam 12/14/2020  ? Acute respiratory failure with hypoxia ([place]) 04/17/2019  ? Uncontrolled type 1 diabetes mellitus with hypoglycemia without coma, with long-term current use of insulin ([place]) 04/17/2019  ? COVID-19 virus infection   ? Pneumonia due to COVID-19 virus 04/16/2019  ? 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 ([place]) 03/01/2019  ? Acute bronchitis 07/18/2017  ? Oral thrush 04/19/2017  ? Chronic seasonal allergic rhinitis 03/19/2017  ? Diabetic retinopathy of both eyes without macular edema associated with type 2 diabetes mellitus ([place]) 08/30/2016  ? Mild persistent asthma without complication 02/63/7858  ? Diabetes mellitus with complication ([place])   ? Atypical chest pain   ? Muscle spasm 08/20/2014  ? Plantar fasciitis of right foot 03/04/2014  ? Obesity, unspecified 08/03/2013  ? Hx of CABG   ? Ejection fraction   ?  HTN (hypertension) 08/22/2012  ? Adjustment disorder with mixed anxiety and depressed mood 03/05/2012  ? Insulin pump in place 02/06/2012  ? Hypothyroidism 09/29/2011  ? Edema 08/25/2011  ? Gastroparesis 09/07/2010  ? Vitamin D deficiency 08/10/2010  ? Hyperlipidemia 02/21/2010  ? Muscle spasm of left shoulder 04/29/2009  ? PALPITATIONS 04/12/2009  ? WHEEZING 04/12/2009  ? GERD 01/19/2009  ? Chest pain 02/04/2008  ? Diabetes mellitus type 2, insulin dependent ([place]) 06/12/2007  ? Anxiety disorder 06/12/2007  ? Coronary artery disease involving coronary bypass graft of native heart 06/12/2007  ? Asthma in adult 06/12/2007  ? ?Social History  ? ?Socioeconomic History  ? Marital status: Single  ?  Spouse name: Not on file  ? Number of children: 1  ? Years of education: Not on file  ? Highest education level: Not on file  ?Occupational History  ? Occupation: disabilied  ?Tobacco Use  ? Smoking status: Former  ?  Packs/day: 1.00  ?  Years: 3.00  ?  Pack years: 3.00  ?   Types: Cigarettes  ?  Start date: 03/14/1991  ?  Quit date: 05/22/1993  ?  Years since quitting: 28.2  ? Smokeless tobacco: Never  ? Tobacco comments:  ?   Year Quit: 1995  ?Vaping Use  ? Vaping Use: Never [place]

## 2021-08-22 NOTE — Telephone Encounter (Signed)
[name] ?[birth year] ?[id number] ? ? ?Dear Dr. Dr Dorene Ar,  ? ? ?Amy Esterwood, PA-C has scheduled the above individual for a(n) Colonoscopy at 10/18/2021 on 11:00 am.  Our records show that this patient is on insulin therapy via an insulin pump. ? ?Our colonoscopy prep protocol requires that: ? ?? the patient must be on a clear liquid diet the entire day prior to the procedure date as well as the morning of the procedure ?? the patient must be NPO for 3 to 4 hours prior to the procedure  ?? the patient must consume a PEG 3350 solution to prepare for the procedure. ? ?Please advise [place] of any adjustments that need to be made to the patient?s insulin pump therapy prior to the above procedure date.   ? ?Please route or fax your response to [phone] Retia Passe, CMA .  If you have any questions, please call me at [phone]. ? ?Thank you for your help with this matter. ? ?Sincerely, ? ?@signature @ ? ? ? ?Physician Recommendation:  _____________________________________________________________________ ? ?______________________________________________________________________ ? ?______________________________________________________________________ ? ?______________________________________________________________________ ? ?

## 2021-08-23 ENCOUNTER — Encounter: Payer: Self-pay | Admitting: Pharmacist

## 2021-08-23 ENCOUNTER — Ambulatory Visit (INDEPENDENT_AMBULATORY_CARE_PROVIDER_SITE_OTHER): Payer: Medicare Other | Admitting: Pharmacist

## 2021-08-23 VITALS — BP 112/62 | HR 73 | Resp 17 | Ht 64.0 in | Wt 244.8 lb

## 2021-08-23 DIAGNOSIS — I25810 Atherosclerosis of coronary artery bypass graft(s) without angina pectoris: Secondary | ICD-10-CM | POA: Diagnosis not present

## 2021-08-23 DIAGNOSIS — E785 Hyperlipidemia, unspecified: Secondary | ICD-10-CM

## 2021-08-23 MED ORDER — ROSUVASTATIN CALCIUM 20 MG PO TABS: 20.0000 mg | ORAL_TABLET | Freq: Every day | ORAL | 3 refills | Status: DC

## 2021-08-23 MED ORDER — PRALUENT 150 MG/ML ~~LOC~~ SOAJ: 150.0000 mg | SUBCUTANEOUS | 3 refills | Status: DC

## 2021-08-23 NOTE — Patient Instructions (Addendum)
It was nice meeting you today ? ?We would like your LDL (bad cholesterol) to be less than 55 ? ?Please continue your Praluent every 2 weeks and your rosuvastatin 20mg  daily ? ?Please update your cholesterol panel in about 2-3 months ? ?Please call with my any questions! ? ? , PharmD, BCACP, CDCES, CPP ?[address], [address] ?[place], [place], [place] ?Phone: [phone], Fax: [phone]  ? ? ? ? ?

## 2021-08-23 NOTE — Progress Notes (Signed)
Patient ID: [name]                 DOB: 1965-11-21                    MRN: [id number] ? ? ? ? ?HPI: ?[name] is a 56 y.o. female patient referred to lipid clinic by Dr Percival Spanish. PMH is significant for CAD, hx of CABG, HTN, HLD, and T1DM.  Has previously been prescribed Praluent and rosuvastatin but has not been taking them. ? ?Patient presents today in good spirits.  Has not been taking her atorvastatin $RemoveBeforeDE'20mg'[id number]$  once daily because she was worried it may have been causing muscle pains but she was not sure. Has issues swallowing tablets so she had been crushing medication and placing in apple sauce. ? ?Brought praluent pen with her because she was not sure how to administer.   ? ?Wears insulin pump and uses Libre CGM.  Has been receiving steroid injections due to knee pain which she said has increased her blood sugars. ? ?Current Medications:  ?Rosuvastatin $RemoveBefore'20mg'YzeWhqdIjEbBn$  daily ? ?Intolerances: N/A ?Risk Factors:  ?CAD ?HTN ?DM ?Hx of CABG ? ?LDL goal: <55 ? ?Labs: TC 211, Trigs 122, HDL 56, LDL 133 (11/16/20) ? ?Past Medical History:  ?Diagnosis Date  ? Anxiety   ? Asthma   ? Blood transfusion without reported diagnosis   ? 2010 CABG  ? CAD (coronary artery disease)   ? 4v CABG, 9/10; NL LVF, status post followup Cardiolite November 2012 no ischemia ejection fraction 65%  ? Cataract   ? are forming  ? Diabetes mellitus   ? Dyslipidemia   ?  LDL 22 mg percent on Crestor  ? Esophageal stricture   ? Fatty liver   ? Gastroparesis due to DM [facility])   ? GERD (gastroesophageal reflux disease)   ? Glaucoma   ? Hyperlipidemia   ? Hypertension   ? Hypothyroidism   ? Knee injury, right, initial encounter   ? Left-sided chest wall pain   ? Myocardial infarction [facility]) 2010  ? 2008 x 2 stents, 2009 x 2 stents. 01-2009 CABG x 4   ? ? ?Current Outpatient Medications on File Prior to Visit  ?Medication Sig Dispense Refill  ? albuterol (VENTOLIN HFA) 108 (90 Base) MCG/ACT inhaler Inhale 2 puffs into the lungs every 6 (six) hours as  needed for wheezing or shortness of breath. 8 g 1  ? Alirocumab (PRALUENT) 150 MG/ML SOAJ Inject 150 mg into the skin every 14 (fourteen) days. 2 mL 11  ? ALPRAZolam (XANAX) 0.25 MG tablet Take 0.25 mg by mouth 2 (two) times daily as needed for anxiety or sleep.   1  ? ammonium lactate (AMLACTIN) 12 % lotion Apply 1 application topically as needed for dry skin. 400 g 0  ? aspirin 81 MG chewable tablet Chew 81 mg by mouth daily.    ? atenolol (TENORMIN) 25 MG tablet Take 25 mg by mouth daily.    ? ciclopirox (PENLAC) 8 % solution APPLY TOPICALLY AT BEDTIME. APPLY OVER NAIL AND SURROUNDING SKIN. APPLY DAILY OVER PREVIOUS COAT. AFTER SEVEN (7) DAYS, MAY REMOVE WITH ALCOHOL AND CONTINUE CYCLE. 6.6 mL 0  ? Fluticasone-Umeclidin-Vilant (TRELEGY ELLIPTA) 100-62.5-25 MCG/INH AEPB Inhale into the lungs daily.    ? furosemide (LASIX) 40 MG tablet TAKE 1 TABLET BY MOUTH EVERY DAY 30 tablet 0  ? HYDROcodone-acetaminophen (NORCO/VICODIN) 5-325 MG tablet Take one tab po q 4 hrs prn pain 12 tablet 0  ?  ibuprofen (ADVIL) 800 MG tablet TAKE 1 TABLET BY MOUTH EVERY 8 HOURS AS NEEDED 60 tablet 0  ? Insulin Human (INSULIN PUMP) SOLN Inject into the skin See admin instructions. Pt uses up to 150 units of NOVOLOG daily VIA PUMP    ? lansoprazole (PREVACID SOLUTAB) 30 MG disintegrating tablet Take 1 tablet (30 mg total) by mouth daily. 90 tablet 3  ? levothyroxine (SYNTHROID) 137 MCG tablet Take 137 mcg by mouth daily before breakfast.    ? liraglutide (VICTOZA) 18 MG/3ML SOPN Inject 1.8 mg into the skin daily.    ? Magnesium Oxide 500 MG CAPS Take 1 capsule by mouth every morning.    ? Misc. Devices (CANE) MISC 1 standard cane for standing and ambulation.  Diagnosis right knee pain. 1 each 0  ? naproxen (NAPROSYN) 500 MG tablet Take 1 tablet (500 mg total) by mouth 2 (two) times daily. Take with food (Patient not taking: Reported on 08/22/2021) 30 tablet 0  ? nitroGLYCERIN (NITROSTAT) 0.4 MG SL tablet Place 1 tablet (0.4 mg total) under  the tongue every 5 (five) minutes x 3 doses as needed for chest pain (if no relief after 2nd dose, proceed to the ED for an evaluation or call 911). 25 tablet 3  ? NOVOLOG 100 UNIT/ML injection Inject 100-150 Units into the skin as directed. Use up to 100-150 units daily in insulin pump as needed  5  ? PEG-KCl-NaCl-NaSulf-Na Asc-C (PLENVU) 140 g SOLR Take 1 kit by mouth as directed. 1 each 0  ? potassium chloride (K-DUR) 10 MEQ tablet Take 1 tablet (10 mEq total) by mouth daily. 30 tablet 6  ? prednisoLONE (PRELONE) 15 MG/5ML SOLN Take 15 mg by mouth 2 (two) times daily.    ? Pseudoeph-Doxylamine-DM-APAP (NYQUIL PO) Take 30 mLs by mouth at bedtime as needed (for cough).    ? rosuvastatin (CRESTOR) 20 MG tablet Take 1 tablet (20 mg total) by mouth daily. 90 tablet 3  ? topiramate (TOPAMAX) 25 MG capsule Take 75 mg by mouth at bedtime.    ? triamcinolone ointment (KENALOG) 0.1 % Apply 1 application topically daily as needed.    ? vitamin C (ASCORBIC ACID) 500 MG tablet Take 500 mg by mouth daily.    ? zinc sulfate 220 (50 Zn) MG capsule Take 1 capsule (220 mg total) by mouth daily. 10 capsule 0  ? ?No current facility-administered medications on file prior to visit.  ? ? ?Allergies  ?Allergen Reactions  ? Metformin Anaphylaxis  ? Penicillins Anaphylaxis and Other (See Comments)  ?  Has patient had a PCN reaction causing immediate rash, facial/tongue/throat swelling, SOB or lightheadedness with hypotension: Yes ?Has patient had a PCN reaction causing severe rash involving mucus membranes or skin necrosis: No ?Has patient had a PCN reaction that required hospitalization No ?Has patient had a PCN reaction occurring within the last 10 years: No ?If all of the above answers are "NO", then may proceed with Cephalosporin use.  ? Metoclopramide Other (See Comments)  ?  Reaction:  Nightmares   ? ? ?Assessment/Plan: ? ?1. Hyperlipidemia - Patient LDL 133 which is above goal of <55. Aggressive goal chosen due to T1DM, CAD and hx  of CABG.  Unclear if patient was having muscle pains due to rosuvastatin. Recommended she restart and patient was agreeable. ? ?Needs further lipid lowering and patient brought her Praluent pen.  Using demo pen, educated patient on mechanism of action, storage, site selection, administration, and possible adverse effects.  Patient  was able to self inject in the room using her medication.  Will continue Praluent every 2 weeks and recheck lipid panel in 2-3 months. ? ?Restart rosuvastatin 20mg  daily ?Start Praluent 150mg  sq q 14 days ?Recheck lipid panel in 2-3 months ? ?Karren Cobble, PharmD, BCACP, [place], CPP ?[address], [address] ?[place], [place], [address] ?Phone: [phone], Fax: [phone]  ?  ?

## 2021-09-26 NOTE — Telephone Encounter (Signed)
Dr. Dorene Ar has advised patient to set temporary rate at 80% or 2.0 units/hour when starting liquid diet. Patient has been contacted and is aware. ?

## 2021-10-18 ENCOUNTER — Encounter: Payer: Medicare Other | Admitting: Gastroenterology

## 2021-11-11 ENCOUNTER — Other Ambulatory Visit: Payer: Self-pay

## 2021-11-17 ENCOUNTER — Telehealth: Payer: Self-pay

## 2021-11-17 NOTE — Telephone Encounter (Signed)
-----   Message from Sammuel Cooper, PA-C sent at 11/14/2021 12:55 PM EDT ----- Regarding: FW: Review old records Please call pt and let her know we received her old records from [facility] and she had a colonoscopy 5 /2021 with 4 small polyps removed which were Tubular adenomas - so she would need a follow up Colon at 3 year interval which will not be until 5 /2024. When she was seen in the office we did not have those records and she is scheduled for Colonoscopy with Dr Adela Lank - we can Cancel colonoscopy as she is not due until next year.  Also on EGD from 10/2019 she had mild esophagitis, bx raised possible Barretts - we can plan to do a Colon and EGD in one year  with Dr Adela Lank ----- Message ----- From: Benancio Deeds, MD Sent: 11/13/2021   8:06 AM EDT To: Sammuel Cooper, PA-C Subject: RE: Review old records                         Got it, that sounds fine. Agree with your recommendations.  ----- Message ----- From: Peterson Ao Sent: 11/11/2021  12:40 PM EDT To: Benancio Deeds, MD Subject: Review old records                             Brett Canales, please pull up my office note on this patient from April 2023-I finally received her old records from [facility] and put results in an addendum on that note.  I do not think she needs colonoscopy or EGD now and would be indicated for 3-year interval follow-up for both which would be May June 2024  Just wanted be sure you agree with that and then we could cancel the colonoscopy that had been scheduled at that office visit and still has not been done

## 2021-11-17 NOTE — Telephone Encounter (Signed)
Spoke with the patient. Explained the change in the plan of care. She agrees to this change. Colonoscopy cancelled. Recall edited.

## 2021-11-23 ENCOUNTER — Encounter: Payer: Medicare Other | Admitting: Gastroenterology

## 2021-11-27 DIAGNOSIS — M2391 Unspecified internal derangement of right knee: Secondary | ICD-10-CM | POA: Insufficient documentation

## 2021-11-30 ENCOUNTER — Telehealth: Payer: Self-pay | Admitting: Cardiology

## 2021-11-30 MED ORDER — ATENOLOL 25 MG PO TABS: 25.0000 mg | ORAL_TABLET | Freq: Every day | ORAL | 2 refills | Status: DC

## 2021-11-30 NOTE — Telephone Encounter (Signed)
*  STAT* If patient is at the pharmacy, call can be transferred to refill team.   1. Which medications need to be refilled? (please list name of each medication and dose if known)   atenolol (TENORMIN) 25 MG tablet  cyclobenzaprine (FLEXERIL) 10 MG tablet  2. Which pharmacy/location (including street and city if local pharmacy) is medication to be sent to?  [id number] - [place], [place] - [address]  3. Do they need a 30 day or 90 day supply? 90 day supply   Only has 1 days worth of medication left.

## 2021-12-01 NOTE — Telephone Encounter (Signed)
   Pt is calling back to f/u. Only one refill sent yesterday.pt also need cyclobenzaprine (FLEXERIL) 10 MG tablet

## 2021-12-01 NOTE — Telephone Encounter (Signed)
Left message for pt, we do not refill flexeril

## 2021-12-31 ENCOUNTER — Encounter (HOSPITAL_COMMUNITY): Payer: Self-pay

## 2021-12-31 ENCOUNTER — Inpatient Hospital Stay (HOSPITAL_COMMUNITY)
Admission: EM | Admit: 2021-12-31 | Discharge: 2022-01-05 | DRG: 252 | Disposition: A | Discharge status: Home / Self Care | Payer: Medicare Other | Attending: Neurology | Admitting: Neurology

## 2021-12-31 ENCOUNTER — Emergency Department (HOSPITAL_COMMUNITY): Payer: Medicare Other

## 2021-12-31 ENCOUNTER — Other Ambulatory Visit: Payer: Self-pay

## 2021-12-31 ENCOUNTER — Encounter (HOSPITAL_COMMUNITY): Payer: Self-pay | Admitting: Emergency Medicine

## 2021-12-31 DIAGNOSIS — I63412 Cerebral infarction due to embolism of left middle cerebral artery: Secondary | ICD-10-CM | POA: Diagnosis not present

## 2021-12-31 DIAGNOSIS — I25700 Atherosclerosis of coronary artery bypass graft(s), unspecified, with unstable angina pectoris: Secondary | ICD-10-CM | POA: Diagnosis present

## 2021-12-31 DIAGNOSIS — R079 Chest pain, unspecified: Secondary | ICD-10-CM | POA: Diagnosis not present

## 2021-12-31 DIAGNOSIS — R4701 Aphasia: Secondary | ICD-10-CM | POA: Diagnosis not present

## 2021-12-31 DIAGNOSIS — Z6841 Body Mass Index (BMI) 40.0 and over, adult: Secondary | ICD-10-CM | POA: Diagnosis not present

## 2021-12-31 DIAGNOSIS — K219 Gastro-esophageal reflux disease without esophagitis: Secondary | ICD-10-CM | POA: Diagnosis present

## 2021-12-31 DIAGNOSIS — Z79890 Hormone replacement therapy (postmenopausal): Secondary | ICD-10-CM

## 2021-12-31 DIAGNOSIS — I5030 Unspecified diastolic (congestive) heart failure: Secondary | ICD-10-CM | POA: Diagnosis present

## 2021-12-31 DIAGNOSIS — I25110 Atherosclerotic heart disease of native coronary artery with unstable angina pectoris: Secondary | ICD-10-CM | POA: Diagnosis present

## 2021-12-31 DIAGNOSIS — I252 Old myocardial infarction: Secondary | ICD-10-CM

## 2021-12-31 DIAGNOSIS — G9782 Other postprocedural complications and disorders of nervous system: Secondary | ICD-10-CM | POA: Diagnosis not present

## 2021-12-31 DIAGNOSIS — Z888 Allergy status to other drugs, medicaments and biological substances status: Secondary | ICD-10-CM

## 2021-12-31 DIAGNOSIS — I63512 Cerebral infarction due to unspecified occlusion or stenosis of left middle cerebral artery: Secondary | ICD-10-CM | POA: Diagnosis present

## 2021-12-31 DIAGNOSIS — K760 Fatty (change of) liver, not elsewhere classified: Secondary | ICD-10-CM | POA: Diagnosis present

## 2021-12-31 DIAGNOSIS — I25810 Atherosclerosis of coronary artery bypass graft(s) without angina pectoris: Secondary | ICD-10-CM | POA: Diagnosis present

## 2021-12-31 DIAGNOSIS — I110 Hypertensive heart disease with heart failure: Secondary | ICD-10-CM | POA: Diagnosis not present

## 2021-12-31 DIAGNOSIS — E119 Type 2 diabetes mellitus without complications: Secondary | ICD-10-CM

## 2021-12-31 DIAGNOSIS — E039 Hypothyroidism, unspecified: Secondary | ICD-10-CM | POA: Diagnosis present

## 2021-12-31 DIAGNOSIS — Z8249 Family history of ischemic heart disease and other diseases of the circulatory system: Secondary | ICD-10-CM

## 2021-12-31 DIAGNOSIS — Z87891 Personal history of nicotine dependence: Secondary | ICD-10-CM | POA: Diagnosis not present

## 2021-12-31 DIAGNOSIS — E785 Hyperlipidemia, unspecified: Secondary | ICD-10-CM | POA: Diagnosis present

## 2021-12-31 DIAGNOSIS — I2510 Atherosclerotic heart disease of native coronary artery without angina pectoris: Secondary | ICD-10-CM | POA: Diagnosis not present

## 2021-12-31 DIAGNOSIS — I214 Non-ST elevation (NSTEMI) myocardial infarction: Principal | ICD-10-CM | POA: Diagnosis present

## 2021-12-31 DIAGNOSIS — Z794 Long term (current) use of insulin: Secondary | ICD-10-CM | POA: Diagnosis not present

## 2021-12-31 DIAGNOSIS — E1165 Type 2 diabetes mellitus with hyperglycemia: Secondary | ICD-10-CM | POA: Diagnosis present

## 2021-12-31 DIAGNOSIS — Z20822 Contact with and (suspected) exposure to covid-19: Secondary | ICD-10-CM | POA: Diagnosis present

## 2021-12-31 DIAGNOSIS — I10 Essential (primary) hypertension: Secondary | ICD-10-CM | POA: Diagnosis not present

## 2021-12-31 DIAGNOSIS — Z951 Presence of aortocoronary bypass graft: Secondary | ICD-10-CM

## 2021-12-31 DIAGNOSIS — R29706 NIHSS score 6: Secondary | ICD-10-CM | POA: Diagnosis not present

## 2021-12-31 DIAGNOSIS — K7460 Unspecified cirrhosis of liver: Secondary | ICD-10-CM | POA: Diagnosis present

## 2021-12-31 DIAGNOSIS — Z825 Family history of asthma and other chronic lower respiratory diseases: Secondary | ICD-10-CM

## 2021-12-31 DIAGNOSIS — Z7982 Long term (current) use of aspirin: Secondary | ICD-10-CM

## 2021-12-31 DIAGNOSIS — E669 Obesity, unspecified: Secondary | ICD-10-CM | POA: Diagnosis present

## 2021-12-31 DIAGNOSIS — Z79899 Other long term (current) drug therapy: Secondary | ICD-10-CM

## 2021-12-31 DIAGNOSIS — Y838 Other surgical procedures as the cause of abnormal reaction of the patient, or of later complication, without mention of misadventure at the time of the procedure: Secondary | ICD-10-CM | POA: Diagnosis not present

## 2021-12-31 DIAGNOSIS — Z880 Allergy status to penicillin: Secondary | ICD-10-CM

## 2021-12-31 DIAGNOSIS — Z833 Family history of diabetes mellitus: Secondary | ICD-10-CM | POA: Diagnosis not present

## 2021-12-31 DIAGNOSIS — Z955 Presence of coronary angioplasty implant and graft: Secondary | ICD-10-CM

## 2021-12-31 DIAGNOSIS — Z9641 Presence of insulin pump (external) (internal): Secondary | ICD-10-CM | POA: Diagnosis present

## 2021-12-31 LAB — CBC
HCT: 41.5 % (ref 36.0–46.0)
Hemoglobin: 13.2 g/dL (ref 12.0–15.0)
MCH: 27.8 pg (ref 26.0–34.0)
MCHC: 31.8 g/dL (ref 30.0–36.0)
MCV: 87.6 fL (ref 80.0–100.0)
Platelets: 329 10*3/uL (ref 150–400)
RBC: 4.74 MIL/uL (ref 3.87–5.11)
RDW: 15.1 % (ref 11.5–15.5)
WBC: 11.3 10*3/uL — ABNORMAL HIGH (ref 4.0–10.5)
nRBC: 0 % (ref 0.0–0.2)

## 2021-12-31 LAB — GLUCOSE, CAPILLARY
Glucose-Capillary: 424 mg/dL — ABNORMAL HIGH (ref 70–99)
Glucose-Capillary: 380 mg/dL — ABNORMAL HIGH (ref 70–99)

## 2021-12-31 LAB — HEMOGLOBIN A1C
Hgb A1c MFr Bld: 7.8 % — ABNORMAL HIGH (ref 4.8–5.6)
Mean Plasma Glucose: 177.16 mg/dL

## 2021-12-31 LAB — BASIC METABOLIC PANEL
Anion gap: 8 (ref 5–15)
BUN: 7 mg/dL (ref 6–20)
CO2: 24 mmol/L (ref 22–32)
Calcium: 9.6 mg/dL (ref 8.9–10.3)
Chloride: 108 mmol/L (ref 98–111)
Creatinine, Ser: 0.67 mg/dL (ref 0.44–1.00)
GFR, Estimated: >60 mL/min (ref >60)
Glucose, Bld: 157 mg/dL — ABNORMAL HIGH (ref 70–99)
Potassium: 3.6 mmol/L (ref 3.5–5.1)
Sodium: 140 mmol/L (ref 135–145)

## 2021-12-31 LAB — TROPONIN I (HIGH SENSITIVITY)
Troponin I (High Sensitivity): 127 ng/L (ref <18)
Troponin I (High Sensitivity): 127 ng/L (ref <18)
Troponin I (High Sensitivity): 178 ng/L (ref <18)
Troponin I (High Sensitivity): 188 ng/L (ref <18)
Troponin I (High Sensitivity): 207 ng/L (ref <18)
Troponin I (High Sensitivity): 186 ng/L (ref <18)

## 2021-12-31 LAB — HEPARIN LEVEL (UNFRACTIONATED)
Heparin Unfractionated: 0.28 IU/mL — ABNORMAL LOW (ref 0.30–0.70)
Heparin Unfractionated: 0.28 IU/mL — ABNORMAL LOW (ref 0.30–0.70)

## 2021-12-31 LAB — CBG MONITORING, ED
Glucose-Capillary: 329 mg/dL — ABNORMAL HIGH (ref 70–99)
Glucose-Capillary: 270 mg/dL — ABNORMAL HIGH (ref 70–99)

## 2021-12-31 LAB — HIV ANTIBODY (ROUTINE TESTING W REFLEX): HIV Screen 4th Generation wRfx: NONREACTIVE

## 2021-12-31 MED ORDER — PANTOPRAZOLE SODIUM 40 MG PO TBEC
40.0000 mg | DELAYED_RELEASE_TABLET | Freq: Every day | ORAL | Status: DC
  Administered 2021-12-31 – 2022-01-05 (×6): 40 mg via ORAL
  Filled 2021-12-31 (×5): qty 1

## 2021-12-31 MED ORDER — MONTELUKAST SODIUM 10 MG PO TABS
10.0000 mg | ORAL_TABLET | Freq: Every day | ORAL | Status: DC
  Administered 2021-12-31 – 2022-01-05 (×6): 10 mg via ORAL
  Filled 2021-12-31 (×6): qty 1

## 2021-12-31 MED ORDER — ATENOLOL 25 MG PO TABS
25.0000 mg | ORAL_TABLET | Freq: Every day | ORAL | Status: DC
  Administered 2021-12-31: 25 mg via ORAL
  Filled 2021-12-31: qty 1

## 2021-12-31 MED ORDER — GABAPENTIN 100 MG PO CAPS
200.0000 mg | ORAL_CAPSULE | Freq: Every day | ORAL | Status: DC
  Administered 2021-12-31 – 2022-01-04 (×5): 200 mg via ORAL
  Filled 2021-12-31 (×5): qty 2

## 2021-12-31 MED ORDER — ACETAMINOPHEN 650 MG RE SUPP: 650.0000 mg | Freq: Four times a day (QID) | RECTAL | Status: DC | PRN

## 2021-12-31 MED ORDER — HEPARIN (PORCINE) 25000 UT/250ML-% IV SOLN
1550.0000 [IU]/h | INTRAVENOUS | Status: DC
  Administered 2021-12-31: 1400 [IU]/h via INTRAVENOUS
  Administered 2021-12-31: 1200 [IU]/h via INTRAVENOUS
  Administered 2022-01-01 – 2022-01-02 (×2): 1400 [IU]/h via INTRAVENOUS
  Administered 2022-01-03: 1550 [IU]/h via INTRAVENOUS
  Filled 2021-12-31 (×5): qty 250

## 2021-12-31 MED ORDER — NITROGLYCERIN 0.4 MG SL SUBL
0.4000 mg | SUBLINGUAL_TABLET | SUBLINGUAL | Status: DC | PRN
  Filled 2021-12-31: qty 1

## 2021-12-31 MED ORDER — ONDANSETRON HCL 4 MG/2ML IJ SOLN: 4.0000 mg | Freq: Four times a day (QID) | INTRAMUSCULAR | Status: DC | PRN

## 2021-12-31 MED ORDER — LEVOTHYROXINE SODIUM 25 MCG PO TABS
137.0000 ug | ORAL_TABLET | Freq: Every day | ORAL | Status: DC
  Administered 2021-12-31 – 2022-01-05 (×6): 137 ug via ORAL
  Filled 2021-12-31 (×7): qty 1

## 2021-12-31 MED ORDER — OXYCODONE-ACETAMINOPHEN 5-325 MG PO TABS
1.0000 | ORAL_TABLET | Freq: Three times a day (TID) | ORAL | Status: DC | PRN
  Administered 2022-01-03 – 2022-01-04 (×2): 1 via ORAL
  Filled 2021-12-31 (×2): qty 1

## 2021-12-31 MED ORDER — SODIUM CHLORIDE 0.9 % IV SOLN: 250.0000 mL | INTRAVENOUS | Status: DC | PRN

## 2021-12-31 MED ORDER — NITROGLYCERIN 0.4 MG SL SUBL
0.4000 mg | SUBLINGUAL_TABLET | SUBLINGUAL | Status: DC | PRN
Start: 2021-12-31 — End: 2022-01-05

## 2021-12-31 MED ORDER — LIRAGLUTIDE 18 MG/3ML ~~LOC~~ SOPN
1.8000 mg | PEN_INJECTOR | Freq: Every day | SUBCUTANEOUS | Status: DC
Start: 2021-12-31 — End: 2021-12-31

## 2021-12-31 MED ORDER — ROSUVASTATIN CALCIUM 20 MG PO TABS
20.0000 mg | ORAL_TABLET | Freq: Every day | ORAL | Status: DC
  Administered 2021-12-31 – 2022-01-05 (×5): 20 mg via ORAL
  Filled 2021-12-31 (×6): qty 1

## 2021-12-31 MED ORDER — ALPRAZOLAM 0.25 MG PO TABS
0.2500 mg | ORAL_TABLET | Freq: Two times a day (BID) | ORAL | Status: DC | PRN
Start: 2021-12-31 — End: 2022-01-05

## 2021-12-31 MED ORDER — INSULIN ASPART 100 UNIT/ML IJ SOLN
0.0000 [IU] | Freq: Every day | INTRAMUSCULAR | Status: DC
  Administered 2021-12-31: 5 [IU] via SUBCUTANEOUS
  Administered 2022-01-01: 3 [IU] via SUBCUTANEOUS

## 2021-12-31 MED ORDER — FLUTICASONE FUROATE-VILANTEROL 100-25 MCG/ACT IN AEPB
1.0000 | INHALATION_SPRAY | Freq: Every day | RESPIRATORY_TRACT | Status: DC
  Administered 2021-12-31 – 2022-01-05 (×5): 1 via RESPIRATORY_TRACT
  Filled 2021-12-31 (×4): qty 28

## 2021-12-31 MED ORDER — ASPIRIN 81 MG PO CHEW
81.0000 mg | CHEWABLE_TABLET | Freq: Every day | ORAL | Status: DC
  Administered 2021-12-31 – 2022-01-05 (×5): 81 mg via ORAL
  Filled 2021-12-31 (×5): qty 1

## 2021-12-31 MED ORDER — SODIUM CHLORIDE 0.9% FLUSH
3.0000 mL | Freq: Two times a day (BID) | INTRAVENOUS | Status: DC
Start: 2021-12-31 — End: 2022-01-05
  Administered 2021-12-31 – 2022-01-05 (×7): 3 mL via INTRAVENOUS

## 2021-12-31 MED ORDER — ACETAMINOPHEN 325 MG PO TABS
650.0000 mg | ORAL_TABLET | Freq: Four times a day (QID) | ORAL | Status: DC | PRN
  Administered 2022-01-02 (×2): 650 mg via ORAL
  Filled 2021-12-31 (×2): qty 2

## 2021-12-31 MED ORDER — SODIUM CHLORIDE 0.9% FLUSH
3.0000 mL | INTRAVENOUS | Status: DC | PRN
Start: 2021-12-31 — End: 2022-01-05

## 2021-12-31 MED ORDER — ASPIRIN 81 MG PO CHEW
324.0000 mg | CHEWABLE_TABLET | Freq: Once | ORAL | Status: AC
  Administered 2021-12-31: 324 mg via ORAL
  Filled 2021-12-31: qty 4

## 2021-12-31 MED ORDER — ONDANSETRON HCL 4 MG PO TABS: 4.0000 mg | ORAL_TABLET | Freq: Four times a day (QID) | ORAL | Status: DC | PRN

## 2021-12-31 MED ORDER — ALBUTEROL SULFATE HFA 108 (90 BASE) MCG/ACT IN AERS
2.0000 | INHALATION_SPRAY | Freq: Four times a day (QID) | RESPIRATORY_TRACT | Status: DC | PRN
  Administered 2021-12-31: 2 via RESPIRATORY_TRACT
  Filled 2021-12-31: qty 6.7

## 2021-12-31 MED ORDER — INSULIN ASPART 100 UNIT/ML IJ SOLN
5.0000 [IU] | Freq: Once | INTRAMUSCULAR | Status: AC
  Administered 2021-12-31: 5 [IU] via SUBCUTANEOUS

## 2021-12-31 MED ORDER — FUROSEMIDE 40 MG PO TABS
40.0000 mg | ORAL_TABLET | Freq: Every day | ORAL | Status: DC
  Administered 2021-12-31 – 2022-01-05 (×5): 40 mg via ORAL
  Filled 2021-12-31 (×6): qty 1

## 2021-12-31 MED ORDER — INSULIN ASPART 100 UNIT/ML IJ SOLN
0.0000 [IU] | Freq: Three times a day (TID) | INTRAMUSCULAR | Status: DC
  Administered 2021-12-31: 11 [IU] via SUBCUTANEOUS
  Administered 2022-01-01: 8 [IU] via SUBCUTANEOUS
  Administered 2022-01-01: 11 [IU] via SUBCUTANEOUS
  Administered 2022-01-01: 8 [IU] via SUBCUTANEOUS
  Filled 2021-12-31: qty 1

## 2021-12-31 MED ORDER — HEPARIN BOLUS VIA INFUSION
4000.0000 [IU] | Freq: Once | INTRAVENOUS | Status: AC
  Administered 2021-12-31: 4000 [IU] via INTRAVENOUS

## 2021-12-31 MED ORDER — UMECLIDINIUM BROMIDE 62.5 MCG/ACT IN AEPB
1.0000 | INHALATION_SPRAY | Freq: Every day | RESPIRATORY_TRACT | Status: DC
  Administered 2021-12-31 – 2022-01-05 (×5): 1 via RESPIRATORY_TRACT
  Filled 2021-12-31 (×4): qty 7

## 2021-12-31 NOTE — Progress Notes (Signed)
ANTICOAGULATION CONSULT NOTE - Consult  Pharmacy Consult for Heparin  Indication: chest pain/ACS  Allergies  Allergen Reactions   Metformin Anaphylaxis   Penicillins Anaphylaxis and Other (See Comments)    Has patient had a PCN reaction causing immediate rash, facial/tongue/throat swelling, SOB or lightheadedness with hypotension: Yes Has patient had a PCN reaction causing severe rash involving mucus membranes or skin necrosis: No Has patient had a PCN reaction that required hospitalization No Has patient had a PCN reaction occurring within the last 10 years: No If all of the above answers are "NO", then may proceed with Cephalosporin use.   Metoclopramide Other (See Comments)    Reaction:  Nightmares     Patient Measurements: Height: 5\' 4"  (162.6 cm) (Simultaneous filing. User may not have seen previous data.) Weight: 108.9 kg (240 lb) (Simultaneous filing. User may not have seen previous data.) IBW/kg (Calculated) : 54.7  Vital Signs: BP: 136/63 (08/12 1030) Pulse Rate: 77 (08/12 1030)  Labs: Recent Labs    12/31/21 0203 12/31/21 0403 12/31/21 0602 12/31/21 0757 12/31/21 1359  HGB 13.2  --   --   --   --   HCT 41.5  --   --   --   --   PLT 329  --   --   --   --   HEPARINUNFRC  --   --   --   --  0.28*  CREATININE 0.67  --   --   --   --   TROPONINIHS 127* 127* 178* 188*  --      Estimated Creatinine Clearance: 97 mL/min (by C-G formula based on SCr of 0.67 mg/dL).   Medical History: Past Medical History:  Diagnosis Date   Anxiety    Asthma    Blood transfusion without reported diagnosis    2010 CABG   CAD (coronary artery disease)    4v CABG, 9/10; NL LVF, status post followup Cardiolite November 2012 no ischemia ejection fraction 65%   Cataract    are forming   Diabetes mellitus    Dyslipidemia     LDL 22 mg percent on Crestor   Esophageal stricture    Fatty liver    Gastroparesis due to DM ([facility])    GERD (gastroesophageal reflux disease)     Glaucoma    Hyperlipidemia    Hypertension    Hypothyroidism    Knee injury, right, initial encounter    Left-sided chest wall pain    Myocardial infarction ([facility]) 2010   2008 x 2 stents, 2009 x 2 stents. 01-2009 CABG x 4      Assessment: 56 y/o F with cardiac history in the ED with "burning" in chest, mildly elevated trop. Lab above reviewed.   HL 0.28 subtherapeutic   Goal of Therapy:  Heparin level 0.3-0.7 units/ml Monitor platelets by anticoagulation protocol: Yes   Plan:  Increase heparin drip to 1400 units/hr 2100 Heparin level  Daily CBC/Heparin level Monitor for bleeding  58, PharmD, [facility] Clinical Pharmacist

## 2021-12-31 NOTE — ED Notes (Signed)
Pt needs meds crushed.

## 2021-12-31 NOTE — ED Notes (Signed)
Date and time results received: 12/31/21 0705   Test: Troponin Critical Value: 178  Name of Provider Notified: Dr. Jacqulyn Bath  Orders Received? Or Actions Taken?: notified

## 2021-12-31 NOTE — ED Provider Notes (Signed)
[facility] EMERGENCY DEPARTMENT Provider Note   CSN: [id number] Arrival date & time: 12/31/21  0101     History  Chief Complaint  Patient presents with   Chest Pain    [name] is a 56 y.o. female.  Chest pain twice in last few days. Sharp, burning and felt like indigestion but tonight did not improve. No dyspnea, diaphoresis, light headedness, radiation or nausea. Has CAD but never had a heart attack. Has 4 stents and a 4 vessel CABG in the past. Myoview with Dr. Percival Spanish in march was reassuring. CP free now.    Chest Pain      Home Medications Prior to Admission medications   Medication Sig Start Date End Date Taking? Authorizing Provider  albuterol (VENTOLIN HFA) 108 (90 Base) MCG/ACT inhaler Inhale 2 puffs into the lungs every 6 (six) hours as needed for wheezing or shortness of breath. 04/21/19   Oswald Hillock, MD  Alirocumab (PRALUENT) 150 MG/ML SOAJ Inject 150 mg into the skin every 14 (fourteen) days. 08/23/21   Minus Breeding, MD  ALPRAZolam Duanne Moron) 0.25 MG tablet Take 0.25 mg by mouth 2 (two) times daily as needed for anxiety or sleep.  11/21/17   [provider]  ammonium lactate (AMLACTIN) 12 % lotion Apply 1 application topically as needed for dry skin. 12/10/20   Felipa Furnace, DPM  aspirin 81 MG chewable tablet Chew 81 mg by mouth daily.    [provider]  atenolol (TENORMIN) 25 MG tablet Take 1 tablet (25 mg total) by mouth daily. 11/30/21   Minus Breeding, MD  Cholecalciferol (VITAMIN D3) 125 MCG (5000 UT) CAPS Take 1 tablet by mouth daily.    [provider]  ciclopirox (PENLAC) 8 % solution APPLY TOPICALLY AT BEDTIME. APPLY OVER NAIL AND SURROUNDING SKIN. APPLY DAILY OVER PREVIOUS COAT. AFTER SEVEN (7) DAYS, MAY REMOVE WITH ALCOHOL AND CONTINUE CYCLE. 12/10/20   Felipa Furnace, DPM  cyclobenzaprine (FLEXERIL) 10 MG tablet Take 10 mg by mouth 3 (three) times daily. 08/16/21   [provider]  Fluticasone-Umeclidin-Vilant  (TRELEGY ELLIPTA) 100-62.5-25 MCG/INH AEPB Inhale into the lungs daily.    [provider]  furosemide (LASIX) 40 MG tablet TAKE 1 TABLET BY MOUTH EVERY DAY 11/17/19   Herminio Commons, MD  gabapentin (NEURONTIN) 100 MG capsule Take 200 mg by mouth at bedtime. 07/28/21   [provider]  ibuprofen (ADVIL) 800 MG tablet TAKE 1 TABLET BY MOUTH EVERY 8 HOURS AS NEEDED 06/21/21   Carole Civil, MD  Insulin Human (INSULIN PUMP) SOLN Inject into the skin See admin instructions. Pt uses up to 150 units of NOVOLOG daily VIA PUMP    [provider]  lansoprazole (PREVACID SOLUTAB) 30 MG disintegrating tablet Take 1 tablet (30 mg total) by mouth daily. 08/22/21   Esterwood, Amy S, PA-C  levothyroxine (SYNTHROID) 137 MCG tablet Take 137 mcg by mouth daily before breakfast.    [provider]  liraglutide (VICTOZA) 18 MG/3ML SOPN Inject 1.8 mg into the skin daily.    [provider]  Magnesium Oxide 500 MG CAPS Take 1 capsule by mouth every morning. 04/07/19   [provider]  Misc. Devices (CANE) MISC 1 standard cane for standing and ambulation.  Diagnosis right knee pain. 05/18/21   Triplett, Tammy, PA-C  montelukast (SINGULAIR) 10 MG tablet Take 10 mg by mouth daily. 07/15/21   [provider]  nitroGLYCERIN (NITROSTAT) 0.4 MG SL tablet Place 1 tablet (0.4  mg total) under the tongue every 5 (five) minutes x 3 doses as needed for chest pain (if no relief after 2nd dose, proceed to the ED for an evaluation or call 911). 04/23/20   Minus Breeding, MD  NOVOLOG 100 UNIT/ML injection Inject 100-150 Units into the skin as directed. Use up to 100-150 units daily in insulin pump as needed 11/23/17   [provider]  oxyCODONE-acetaminophen (PERCOCET/ROXICET) 5-325 MG tablet Take 1 tablet by mouth 3 (three) times daily as needed.    [provider]  PEG-KCl-NaCl-NaSulf-Na Asc-C (PLENVU) 140 g SOLR Take 1 kit by mouth as directed. 08/22/21    Esterwood, Amy S, PA-C  potassium chloride (K-DUR) 10 MEQ tablet Take 1 tablet (10 mEq total) by mouth daily. 06/14/17   Herminio Commons, MD  prednisoLONE (PRELONE) 15 MG/5ML SOLN Take 15 mg by mouth 2 (two) times daily. 11/02/20   [provider]  Pseudoeph-Doxylamine-DM-APAP (NYQUIL PO) Take 30 mLs by mouth at bedtime as needed (for cough).    [provider]  rosuvastatin (CRESTOR) 20 MG tablet Take 1 tablet (20 mg total) by mouth daily. 08/23/21 11/21/21  Minus Breeding, MD  triamcinolone ointment (KENALOG) 0.1 % Apply 1 application topically daily as needed. 11/02/20   [provider]  vitamin C (ASCORBIC ACID) 500 MG tablet Take 500 mg by mouth daily.    [provider]  zinc sulfate 220 (50 Zn) MG capsule Take 1 capsule (220 mg total) by mouth daily. 04/22/19   Oswald Hillock, MD      Allergies    Metformin, Penicillins, and Metoclopramide    Review of Systems   Review of Systems  Cardiovascular:  Positive for chest pain.    Physical Exam Updated Vital Signs BP (!) 100/50   Pulse 77   Temp 97.8 F (36.6 C) (Oral)   Resp 19   Ht $R'5\' 4"'ng$  (1.626 m) Comment: Simultaneous filing. User may not have seen previous data.  Wt 108.9 kg Comment: Simultaneous filing. User may not have seen previous data.  LMP 09/22/2019   SpO2 98%   BMI 41.20 kg/m  Physical Exam Vitals and nursing note reviewed.  Constitutional:      Appearance: She is well-developed.  HENT:     Head: Normocephalic and atraumatic.  Cardiovascular:     Rate and Rhythm: Normal rate and regular rhythm.  Pulmonary:     Effort: No respiratory distress.     Breath sounds: Normal breath sounds. No stridor. No decreased breath sounds.  Chest:     Chest wall: No mass or tenderness.  Abdominal:     General: There is no distension.     Palpations: Abdomen is soft.  Musculoskeletal:     Cervical back: Normal range of motion.     Right lower leg: No edema.     Left lower leg: No edema.   Skin:    General: Skin is warm and dry.  Neurological:     Mental Status: She is alert.     ED Results / Procedures / Treatments   Labs (all labs ordered are listed, but only abnormal results are displayed) Labs Reviewed  BASIC METABOLIC PANEL - Abnormal; Notable for the following components:      Result Value   Glucose, Bld 157 (*)    All other components within normal limits  CBC - Abnormal; Notable for the following components:   WBC 11.3 (*)    All other components within normal limits  TROPONIN  I (HIGH SENSITIVITY) - Abnormal; Notable for the following components:   Troponin I (High Sensitivity) 127 (*)    All other components within normal limits  TROPONIN I (HIGH SENSITIVITY) - Abnormal; Notable for the following components:   Troponin I (High Sensitivity) 127 (*)    All other components within normal limits  TROPONIN I (HIGH SENSITIVITY) - Abnormal; Notable for the following components:   Troponin I (High Sensitivity) 178 (*)    All other components within normal limits  HEPARIN LEVEL (UNFRACTIONATED)    EKG EKG Interpretation  Date/Time:  Saturday December 31 2021 01:10:49 EDT Ventricular Rate:  72 PR Interval:  223 QRS Duration: 100 QT Interval:  390 QTC Calculation: 427 R Axis:   70 Text Interpretation: Sinus rhythm Prolonged PR interval Borderline repolarization abnormality Confirmed by Merrily Pew [phone]) on 12/31/2021 1:53:40 AM  Radiology DG Chest Portable 1 View  Result Date: 12/31/2021 CLINICAL DATA:  Reflux symptoms for several days, initial encounter EXAM: PORTABLE CHEST 1 VIEW COMPARISON:  04/16/2019 FINDINGS: Cardiac shadow is stable. Postsurgical changes are again seen. The lungs are clear bilaterally. No bony abnormality is noted. IMPRESSION: No active disease. Electronically Signed   By: Inez Catalina M.D.   On: 12/31/2021 01:23    Procedures .Critical Care  Performed by: Merrily Pew, MD Authorized by: Merrily Pew, MD   Critical care  provider statement:    Critical care time (minutes):  30   Critical care was necessary to treat or prevent imminent or life-threatening deterioration of the following conditions:  Cardiac failure   Critical care was time spent personally by me on the following activities:  Development of treatment plan with patient or surrogate, discussions with consultants, evaluation of patient's response to treatment, examination of patient, ordering and review of laboratory studies, ordering and review of radiographic studies, ordering and performing treatments and interventions, pulse oximetry, re-evaluation of patient's condition and review of old charts     Medications Ordered in ED Medications  nitroGLYCERIN (NITROSTAT) SL tablet 0.4 mg (has no administration in time range)  heparin ADULT infusion 100 units/mL (25000 units/269mL) (1,200 Units/hr Intravenous New Bag/Given 12/31/21 6045)  aspirin chewable tablet 324 mg (324 mg Oral Given 12/31/21 0337)  heparin bolus via infusion 4,000 Units (4,000 Units Intravenous Bolus from Bag 12/31/21 0612)    ED Course/ Medical Decision Making/ A&P                           Medical Decision Making Risk OTC drugs. Prescription drug management. Decision regarding hospitalization.   Delta troponins flat at 127 but some question if it was the same blood or taken too close to each other. Talked to cardiology, Dr. Chalmers Cater, who recommended heparin, echo, trend troponins and admit to hospitalist here. D/w TRH for admission.   Third trop back at 178. Dr. Manuella Ghazi called back and discussed the case, will plan for admission to [facility] so she can get a cardiac consultation with the rising troponin. Already on heparin.   Final Clinical Impression(s) / ED Diagnoses Final diagnoses:  NSTEMI (non-ST elevated myocardial infarction) [facility])    Rx / [place] Orders ED Discharge Orders     None         Dreonna Hussein, Corene Cornea, MD 12/31/21 [phone]

## 2021-12-31 NOTE — Progress Notes (Signed)
ANTICOAGULATION CONSULT NOTE - Initial Consult  Pharmacy Consult for Heparin  Indication: chest pain/ACS  Allergies  Allergen Reactions   Metformin Anaphylaxis   Penicillins Anaphylaxis and Other (See Comments)    Has patient had a PCN reaction causing immediate rash, facial/tongue/throat swelling, SOB or lightheadedness with hypotension: Yes Has patient had a PCN reaction causing severe rash involving mucus membranes or skin necrosis: No Has patient had a PCN reaction that required hospitalization No Has patient had a PCN reaction occurring within the last 10 years: No If all of the above answers are "NO", then may proceed with Cephalosporin use.   Metoclopramide Other (See Comments)    Reaction:  Nightmares     Patient Measurements: Height: 5\' 4"  (162.6 cm) (Simultaneous filing. User may not have seen previous data.) Weight: 108.9 kg (240 lb) (Simultaneous filing. User may not have seen previous data.) IBW/kg (Calculated) : 54.7  Vital Signs: Temp: 97.8 F (36.6 C) (08/12 0108) Temp Source: Oral (08/12 0108) BP: 100/50 (08/12 0700) Pulse Rate: 77 (08/12 0700)  Labs: Recent Labs    12/31/21 0203 12/31/21 0403 12/31/21 0602  HGB 13.2  --   --   HCT 41.5  --   --   PLT 329  --   --   CREATININE 0.67  --   --   TROPONINIHS 127* 127* 178*    Estimated Creatinine Clearance: 97 mL/min (by C-G formula based on SCr of 0.67 mg/dL).   Medical History: Past Medical History:  Diagnosis Date   Anxiety    Asthma    Blood transfusion without reported diagnosis    2010 CABG   CAD (coronary artery disease)    4v CABG, 9/10; NL LVF, status post followup Cardiolite November 2012 no ischemia ejection fraction 65%   Cataract    are forming   Diabetes mellitus    Dyslipidemia     LDL 22 mg percent on Crestor   Esophageal stricture    Fatty liver    Gastroparesis due to DM (HCC)    GERD (gastroesophageal reflux disease)    Glaucoma    Hyperlipidemia    Hypertension     Hypothyroidism    Knee injury, right, initial encounter    Left-sided chest wall pain    Myocardial infarction (HCC) 2010   2008 x 2 stents, 2009 x 2 stents. 01-2009 CABG x 4      Assessment: 56 y/o F with cardiac history in the ED with "burning" in chest, mildly elevated trop. Lab above reviewed.    Goal of Therapy:  Heparin level 0.3-0.7 units/ml Monitor platelets by anticoagulation protocol: Yes   Plan:  Heparin 4000 units BOLUS Start heparin drip at 1200 units/hr 1400 Heparin level Daily CBC/Heparin level Monitor for bleeding  58, PharmD, BCPS Clinical Pharmacist Phone: [phone]

## 2021-12-31 NOTE — ED Triage Notes (Signed)
Pt bib EMS with c/o "burning sensation in her heart since Wednesday".

## 2021-12-31 NOTE — H&P (Signed)
History and Physical    [place] EUM:[id number] DOB: 11-Aug-1965 DOA: 12/31/2021  PCP: [facility] [place]   Patient coming from: Home  Chief Complaint: Burning in chest  HPI: [name] is a 56 y.o. female with medical history significant for CAD with prior stents and CABG four-vessel in 2010, obesity, recurrent esophageal strictures, GERD, hypothyroidism, and type 2 diabetes who presented to the ED with worsening burning sensation to her chest that started approximately 3 days ago.  She states that she will have burning sensation occasionally, but this has been more prolonged and more intense than usual.  She denies any diaphoresis, shortness of breath, nausea, vomiting, lower extremity edema, cough, fevers, or chills.  She denies any exertional symptoms and does state that she has some radiation of her burning sensation into her back into the side of her rib cage.  She does have planned EGD for esophageal dilation upcoming this Tuesday and gets this done yearly.  She states that she is mostly tolerating liquids at this point.   ED Course: Vital signs stable and patient is afebrile.  Initial troponins were noted to be 127, but have increased to 178 on repeat.  EKG with sinus rhythm and no acute abnormalities noted.  EDP has discussed case with cardiology who feels that patient could remain at [facility] on heparin infusion, but this was prior to noted troponin elevation.  Her symptoms are currently stable.  Review of Systems: Reviewed as noted above, otherwise negative.  Past Medical History:  Diagnosis Date   Anxiety    Asthma    Blood transfusion without reported diagnosis    2010 CABG   CAD (coronary artery disease)    4v CABG, 9/10; NL LVF, status post followup Cardiolite November 2012 no ischemia ejection fraction 65%   Cataract    are forming   Diabetes mellitus    Dyslipidemia     LDL 22 mg percent on Crestor   Esophageal stricture    Fatty liver     Gastroparesis due to DM ([place])    GERD (gastroesophageal reflux disease)    Glaucoma    Hyperlipidemia    Hypertension    Hypothyroidism    Knee injury, right, initial encounter    Left-sided chest wall pain    Myocardial infarction ([place]) 2010   2008 x 2 stents, 2009 x 2 stents. 01-2009 CABG x 4     Past Surgical History:  Procedure Laterality Date   ANGIOPLASTY     with stenting 2008,2009   CARDIAC SURGERY     CESAREAN SECTION     CORONARY ARTERY BYPASS GRAFT  01/26/2009   2008-2 stents; 2009-2 stents   EYE SURGERY     open heart surgery   2010   TUBAL LIGATION       reports that she quit smoking about 28 years ago. Her smoking use included cigarettes. She started smoking about 30 years ago. She has a 3.00 pack-year smoking history. She has never used smokeless tobacco. She reports that she does not drink alcohol and does not use drugs.  Allergies  Allergen Reactions   Metformin Anaphylaxis   Penicillins Anaphylaxis and Other (See Comments)    Has patient had a PCN reaction causing immediate rash, facial/tongue/throat swelling, SOB or lightheadedness with hypotension: Yes Has patient had a PCN reaction causing severe rash involving mucus membranes or skin necrosis: No Has patient had a PCN reaction that required hospitalization No Has patient had a PCN reaction occurring  within the last 10 years: No If all of the above answers are "NO", then may proceed with Cephalosporin use.   Metoclopramide Other (See Comments)    Reaction:  Nightmares     Family History  Problem Relation Age of Onset   Heart disease Father    Asthma Father    COPD Father    Diabetes Mother    Asthma Paternal Aunt    Asthma Son    Colon cancer Neg Hx    Colon polyps Neg Hx    Esophageal cancer Neg Hx    Rectal cancer Neg Hx    Stomach cancer Neg Hx     Prior to Admission medications   Medication Sig Start Date End Date Taking? Authorizing Provider  albuterol (VENTOLIN HFA) 108 (90 Base)  MCG/ACT inhaler Inhale 2 puffs into the lungs every 6 (six) hours as needed for wheezing or shortness of breath. 04/21/19   Oswald Hillock, MD  Alirocumab (PRALUENT) 150 MG/ML SOAJ Inject 150 mg into the skin every 14 (fourteen) days. 08/23/21   Minus Breeding, MD  ALPRAZolam Duanne Moron) 0.25 MG tablet Take 0.25 mg by mouth 2 (two) times daily as needed for anxiety or sleep.  11/21/17   [provider]  ammonium lactate (AMLACTIN) 12 % lotion Apply 1 application topically as needed for dry skin. 12/10/20   Felipa Furnace, DPM  aspirin 81 MG chewable tablet Chew 81 mg by mouth daily.    [provider]  atenolol (TENORMIN) 25 MG tablet Take 1 tablet (25 mg total) by mouth daily. 11/30/21   Minus Breeding, MD  Cholecalciferol (VITAMIN D3) 125 MCG (5000 UT) CAPS Take 1 tablet by mouth daily.    [provider]  ciclopirox (PENLAC) 8 % solution APPLY TOPICALLY AT BEDTIME. APPLY OVER NAIL AND SURROUNDING SKIN. APPLY DAILY OVER PREVIOUS COAT. AFTER SEVEN (7) DAYS, MAY REMOVE WITH ALCOHOL AND CONTINUE CYCLE. 12/10/20   Felipa Furnace, DPM  cyclobenzaprine (FLEXERIL) 10 MG tablet Take 10 mg by mouth 3 (three) times daily. 08/16/21   [provider]  Fluticasone-Umeclidin-Vilant (TRELEGY ELLIPTA) 100-62.5-25 MCG/INH AEPB Inhale into the lungs daily.    [provider]  furosemide (LASIX) 40 MG tablet TAKE 1 TABLET BY MOUTH EVERY DAY 11/17/19   Herminio Commons, MD  gabapentin (NEURONTIN) 100 MG capsule Take 200 mg by mouth at bedtime. 07/28/21   [provider]  ibuprofen (ADVIL) 800 MG tablet TAKE 1 TABLET BY MOUTH EVERY 8 HOURS AS NEEDED 06/21/21   Carole Civil, MD  Insulin Human (INSULIN PUMP) SOLN Inject into the skin See admin instructions. Pt uses up to 150 units of NOVOLOG daily VIA PUMP    [provider]  lansoprazole (PREVACID SOLUTAB) 30 MG disintegrating tablet Take 1 tablet (30 mg total) by mouth daily. 08/22/21   Esterwood, Amy S, PA-C   levothyroxine (SYNTHROID) 137 MCG tablet Take 137 mcg by mouth daily before breakfast.    [provider]  liraglutide (VICTOZA) 18 MG/3ML SOPN Inject 1.8 mg into the skin daily.    [provider]  Magnesium Oxide 500 MG CAPS Take 1 capsule by mouth every morning. 04/07/19   [provider]  Misc. Devices (CANE) MISC 1 standard cane for standing and ambulation.  Diagnosis right knee pain. 05/18/21   Triplett, Tammy, PA-C  montelukast (SINGULAIR) 10 MG tablet Take 10 mg by mouth daily. 07/15/21   [provider]  nitroGLYCERIN (NITROSTAT) 0.4 MG SL tablet Place 1  tablet (0.4 mg total) under the tongue every 5 (five) minutes x 3 doses as needed for chest pain (if no relief after 2nd dose, proceed to the ED for an evaluation or call 911). 04/23/20   Minus Breeding, MD  NOVOLOG 100 UNIT/ML injection Inject 100-150 Units into the skin as directed. Use up to 100-150 units daily in insulin pump as needed 11/23/17   [provider]  oxyCODONE-acetaminophen (PERCOCET/ROXICET) 5-325 MG tablet Take 1 tablet by mouth 3 (three) times daily as needed.    [provider]  PEG-KCl-NaCl-NaSulf-Na Asc-C (PLENVU) 140 g SOLR Take 1 kit by mouth as directed. 08/22/21   Esterwood, Amy S, PA-C  potassium chloride (K-DUR) 10 MEQ tablet Take 1 tablet (10 mEq total) by mouth daily. 06/14/17   Herminio Commons, MD  prednisoLONE (PRELONE) 15 MG/5ML SOLN Take 15 mg by mouth 2 (two) times daily. 11/02/20   [provider]  Pseudoeph-Doxylamine-DM-APAP (NYQUIL PO) Take 30 mLs by mouth at bedtime as needed (for cough).    [provider]  rosuvastatin (CRESTOR) 20 MG tablet Take 1 tablet (20 mg total) by mouth daily. 08/23/21 11/21/21  Minus Breeding, MD  triamcinolone ointment (KENALOG) 0.1 % Apply 1 application topically daily as needed. 11/02/20   [provider]  vitamin C (ASCORBIC ACID) 500 MG tablet Take 500 mg by mouth daily.    [provider]  zinc sulfate 220 (50 Zn) MG capsule Take 1 capsule (220 mg total) by mouth daily. 04/22/19   Oswald Hillock, MD    Physical Exam: Vitals:   12/31/21 0600 12/31/21 0630 12/31/21 0700 12/31/21 0730  BP: (!) 124/96 (!) 128/59 (!) 100/50 135/69  Pulse: 74 76 77 77  Resp: _0 Temp:      TempSrc:      SpO2: 96% 99% 98% 92%  Weight:      Height:        Constitutional: NAD, calm, comfortable, obese Vitals:   12/31/21 0600 12/31/21 0630 12/31/21 0700 12/31/21 0730  BP: (!) 124/96 (!) 128/59 (!) 100/50 135/69  Pulse: 74 76 77 77  Resp: _1 Temp:      TempSrc:      SpO2: 96% 99% 98% 92%  Weight:      Height:       Eyes: lids and conjunctivae normal Neck: normal, supple Respiratory: clear to auscultation bilaterally. Normal respiratory effort. No accessory muscle use.  Cardiovascular: Regular rate and rhythm, no murmurs. Abdomen: no tenderness, no distention. Bowel sounds positive.  Musculoskeletal:  No edema. Skin: no rashes, lesions, ulcers.  Psychiatric: Flat affect  Labs on Admission: I have personally reviewed following labs and imaging studies  CBC: Recent Labs  Lab 12/31/21 0203  WBC 11.3*  HGB 13.2  HCT 41.5  MCV 87.6  PLT 409   Basic Metabolic Panel: Recent Labs  Lab 12/31/21 0203  NA 140  K 3.6  CL 108  CO2 24  GLUCOSE 157*  BUN 7  CREATININE 0.67  CALCIUM 9.6   GFR: Estimated Creatinine Clearance: 97 mL/min (by C-G formula based on SCr of 0.67 mg/dL). Liver Function Tests: No results for input(s): "AST", "ALT", "ALKPHOS", "BILITOT", "PROT", "ALBUMIN" in the last 168 hours. No results for input(s): "LIPASE", "AMYLASE" in the last 168 hours. No results for input(s): "AMMONIA" in the last 168 hours. Coagulation Profile: No results for input(s): "INR", "PROTIME" in the last 168 hours. Cardiac Enzymes: No results for input(s): "  CKTOTAL", "CKMB", "CKMBINDEX", "TROPONINI" in the last 168 hours. BNP (last 3 results) No results for  input(s): "PROBNP" in the last 8760 hours. HbA1C: No results for input(s): "HGBA1C" in the last 72 hours. CBG: No results for input(s): "GLUCAP" in the last 168 hours. Lipid Profile: No results for input(s): "CHOL", "HDL", "LDLCALC", "TRIG", "CHOLHDL", "LDLDIRECT" in the last 72 hours. Thyroid Function Tests: No results for input(s): "TSH", "T4TOTAL", "FREET4", "T3FREE", "THYROIDAB" in the last 72 hours. Anemia Panel: No results for input(s): "VITAMINB12", "FOLATE", "FERRITIN", "TIBC", "IRON", "RETICCTPCT" in the last 72 hours. Urine analysis:    Component Value Date/Time   COLORURINE YELLOW 04/13/2016 0319   APPEARANCEUR CLEAR 04/13/2016 0319   LABSPEC 1.009 04/13/2016 0319   PHURINE 7.5 04/13/2016 0319   GLUCOSEU NEGATIVE 04/13/2016 0319   HGBUR NEGATIVE 04/13/2016 0319   BILIRUBINUR NEGATIVE 04/13/2016 0319   KETONESUR NEGATIVE 04/13/2016 0319   PROTEINUR NEGATIVE 04/13/2016 0319   UROBILINOGEN 0.2 08/21/2011 1741   NITRITE NEGATIVE 04/13/2016 0319   LEUKOCYTESUR NEGATIVE 04/13/2016 0319    Radiological Exams on Admission: DG Chest Portable 1 View  Result Date: 12/31/2021 CLINICAL DATA:  Reflux symptoms for several days, initial encounter EXAM: PORTABLE CHEST 1 VIEW COMPARISON:  04/16/2019 FINDINGS: Cardiac shadow is stable. Postsurgical changes are again seen. The lungs are clear bilaterally. No bony abnormality is noted. IMPRESSION: No active disease. Electronically Signed   By: Inez Catalina M.D.   On: 12/31/2021 01:23    EKG: Independently reviewed. SR 72bpm.  Assessment/Plan Principal Problem:   NSTEMI (non-ST elevated myocardial infarction) ([place]) Active Problems:   Diabetes mellitus type 2, insulin dependent ([place])   Hyperlipidemia   Coronary artery disease involving coronary bypass graft of native heart   GERD   Hypothyroidism   HTN (hypertension)   Hx of CABG   Obesity, unspecified    Elevated troponin with concern for NSTEMI with prior history of  CAD/CABG -Appreciate cardiology evaluation with transfer to [place] to trend troponin and monitor on telemetry -Continue heparin infusion  Type 2 diabetes with associated neuropathy -SSI -Check hemoglobin A1c -Gabapentin for neuropathy  History of esophageal stricture/GERD -EGD planned for upcoming Tuesday for dilation -Continue PPI  Hypertension -Continue atenolol  Hypothyroidism -Continue levothyroxine  Dyslipidemia -Continue rosuvastatin  Morbid obesity -BMI 41.20 -Lifestyle changes outpatient   DVT prophylaxis: Heparin drip Code Status: Full Family Communication: None at bedside Disposition Plan:Transfer to [facility] for Cardiology evaluation Consults called:Cardiology Admission status: Inpatient, Cardiac  Severity of Illness: The appropriate patient status for this patient is INPATIENT. Inpatient status is judged to be reasonable and necessary in order to provide the required intensity of service to ensure the patient's safety. The patient's presenting symptoms, physical exam findings, and initial radiographic and laboratory data in the context of their chronic comorbidities is felt to place them at high risk for further clinical deterioration. Furthermore, it is not anticipated that the patient will be medically stable for discharge from the hospital within 2 midnights of admission.   * I certify that at the point of admission it is my clinical judgment that the patient will require inpatient hospital care spanning beyond 2 midnights from the point of admission due to high intensity of service, high risk for further deterioration and high frequency of surveillance required.*   Pratik D Shah DO [facility]  If 7PM-7AM, please contact night-coverage [email or web address]  12/31/2021, 8:03 AM

## 2021-12-31 NOTE — ED Triage Notes (Signed)
Pt c/o chest burning and GERD for the past 3 days. Pt states the burning after eating pizza for dinner. Hx of cardiac stents and CABG.

## 2021-12-31 NOTE — Progress Notes (Signed)
Arrived to 6E04 via [facility] stretcher for transfer admission from [facility]. Denies CP, SOB or other c/o. Oriented to room//unit. Call bell in reach. Encouraged to call prn needs etc. Verbalized understanding. Cardiac Monitor [phone] MX [phone] verified with telemetry.

## 2022-01-01 ENCOUNTER — Inpatient Hospital Stay (HOSPITAL_COMMUNITY): Payer: Medicare Other

## 2022-01-01 DIAGNOSIS — Z794 Long term (current) use of insulin: Secondary | ICD-10-CM

## 2022-01-01 DIAGNOSIS — R079 Chest pain, unspecified: Secondary | ICD-10-CM | POA: Diagnosis not present

## 2022-01-01 DIAGNOSIS — I214 Non-ST elevation (NSTEMI) myocardial infarction: Secondary | ICD-10-CM | POA: Diagnosis not present

## 2022-01-01 DIAGNOSIS — E119 Type 2 diabetes mellitus without complications: Secondary | ICD-10-CM | POA: Diagnosis not present

## 2022-01-01 DIAGNOSIS — I10 Essential (primary) hypertension: Secondary | ICD-10-CM | POA: Diagnosis not present

## 2022-01-01 LAB — ECHOCARDIOGRAM COMPLETE
Area-P 1/2: 3.99 cm2
Height: 64 in
S' Lateral: 3 cm
Weight: 3756.8 oz

## 2022-01-01 LAB — TROPONIN I (HIGH SENSITIVITY)
Troponin I (High Sensitivity): 179 ng/L (ref <18)
Troponin I (High Sensitivity): 116 ng/L (ref <18)
Troponin I (High Sensitivity): 137 ng/L (ref <18)

## 2022-01-01 LAB — BASIC METABOLIC PANEL
Anion gap: 9 (ref 5–15)
BUN: 7 mg/dL (ref 6–20)
CO2: 25 mmol/L (ref 22–32)
Calcium: 10.1 mg/dL (ref 8.9–10.3)
Chloride: 103 mmol/L (ref 98–111)
Creatinine, Ser: 0.71 mg/dL (ref 0.44–1.00)
GFR, Estimated: >60 mL/min (ref >60)
Glucose, Bld: 222 mg/dL — ABNORMAL HIGH (ref 70–99)
Potassium: 3.9 mmol/L (ref 3.5–5.1)
Sodium: 137 mmol/L (ref 135–145)

## 2022-01-01 LAB — HEPARIN LEVEL (UNFRACTIONATED)
Heparin Unfractionated: 0.35 IU/mL (ref 0.30–0.70)
Heparin Unfractionated: 0.34 IU/mL (ref 0.30–0.70)

## 2022-01-01 LAB — CBC
HCT: 42.2 % (ref 36.0–46.0)
Hemoglobin: 14.1 g/dL (ref 12.0–15.0)
MCH: 28 pg (ref 26.0–34.0)
MCHC: 33.4 g/dL (ref 30.0–36.0)
MCV: 83.7 fL (ref 80.0–100.0)
Platelets: 374 10*3/uL (ref 150–400)
RBC: 5.04 MIL/uL (ref 3.87–5.11)
RDW: 15 % (ref 11.5–15.5)
WBC: 12.2 10*3/uL — ABNORMAL HIGH (ref 4.0–10.5)
nRBC: 0 % (ref 0.0–0.2)

## 2022-01-01 LAB — MAGNESIUM: Magnesium: 2 mg/dL (ref 1.7–2.4)

## 2022-01-01 LAB — GLUCOSE, CAPILLARY
Glucose-Capillary: 257 mg/dL — ABNORMAL HIGH (ref 70–99)
Glucose-Capillary: 266 mg/dL — ABNORMAL HIGH (ref 70–99)
Glucose-Capillary: 270 mg/dL — ABNORMAL HIGH (ref 70–99)
Glucose-Capillary: 300 mg/dL — ABNORMAL HIGH (ref 70–99)
Glucose-Capillary: 335 mg/dL — ABNORMAL HIGH (ref 70–99)

## 2022-01-01 MED ORDER — ORAL CARE MOUTH RINSE: 15.0000 mL | OROMUCOSAL | Status: DC | PRN

## 2022-01-01 MED ORDER — INSULIN DETEMIR 100 UNIT/ML ~~LOC~~ SOLN
20.0000 [IU] | Freq: Two times a day (BID) | SUBCUTANEOUS | Status: DC
  Administered 2022-01-01: 20 [IU] via SUBCUTANEOUS
  Filled 2022-01-01 (×3): qty 0.2

## 2022-01-01 MED ORDER — INSULIN DETEMIR 100 UNIT/ML ~~LOC~~ SOLN
10.0000 [IU] | Freq: Two times a day (BID) | SUBCUTANEOUS | Status: DC
  Administered 2022-01-01: 10 [IU] via SUBCUTANEOUS
  Filled 2022-01-01 (×2): qty 0.1

## 2022-01-01 MED ORDER — PERFLUTREN LIPID MICROSPHERE: 1.0000 mL | INTRAVENOUS | Status: AC | PRN

## 2022-01-01 MED ORDER — METOPROLOL TARTRATE 25 MG PO TABS
25.0000 mg | ORAL_TABLET | Freq: Two times a day (BID) | ORAL | Status: DC
  Administered 2022-01-01 – 2022-01-05 (×9): 25 mg via ORAL
  Filled 2022-01-01 (×9): qty 1

## 2022-01-01 MED ORDER — INSULIN DETEMIR 100 UNIT/ML ~~LOC~~ SOLN: 5.0000 [IU] | Freq: Two times a day (BID) | SUBCUTANEOUS | Status: DC

## 2022-01-01 MED ORDER — INSULIN ASPART 100 UNIT/ML IJ SOLN
3.0000 [IU] | Freq: Three times a day (TID) | INTRAMUSCULAR | Status: DC
Start: 2022-01-01 — End: 2022-01-01

## 2022-01-01 MED ORDER — PERFLUTREN LIPID MICROSPHERE
1.0000 mL | INTRAVENOUS | Status: AC | PRN
  Administered 2022-01-01: 2 mL via INTRAVENOUS

## 2022-01-01 MED ORDER — INSULIN ASPART 100 UNIT/ML IJ SOLN
6.0000 [IU] | Freq: Three times a day (TID) | INTRAMUSCULAR | Status: DC
Start: 2022-01-01 — End: 2022-01-02
  Administered 2022-01-01 (×2): 6 [IU] via SUBCUTANEOUS

## 2022-01-01 MED ORDER — INSULIN ASPART 100 UNIT/ML IJ SOLN
4.0000 [IU] | Freq: Three times a day (TID) | INTRAMUSCULAR | Status: DC
  Administered 2022-01-01: 4 [IU] via SUBCUTANEOUS

## 2022-01-01 NOTE — Progress Notes (Signed)
ANTICOAGULATION CONSULT NOTE - Follow Up  Pharmacy Consult for Heparin  Indication: chest pain/ACS  Allergies  Allergen Reactions   Metformin Anaphylaxis   Penicillins Anaphylaxis and Other (See Comments)    Has patient had a PCN reaction causing immediate rash, facial/tongue/throat swelling, SOB or lightheadedness with hypotension: Yes Has patient had a PCN reaction causing severe rash involving mucus membranes or skin necrosis: No Has patient had a PCN reaction that required hospitalization No Has patient had a PCN reaction occurring within the last 10 years: No If all of the above answers are "NO", then may proceed with Cephalosporin use.   Metoclopramide Other (See Comments)    Reaction:  Nightmares     Patient Measurements: Height: 5\' 4"  (162.6 cm) Weight: 106.5 kg (234 lb 12.8 oz) IBW/kg (Calculated) : 54.7  Vital Signs: Temp: 97.8 F (36.6 C) (08/13 1120) Temp Source: Oral (08/13 1120) BP: 131/72 (08/13 1120) Pulse Rate: 72 (08/13 1120)  Labs: Recent Labs    12/31/21 0203 12/31/21 0403 12/31/21 1359 12/31/21 1540 12/31/21 2044 01/01/22 0209 01/01/22 0947  HGB 13.2  --   --   --   --   --   --   HCT 41.5  --   --   --   --   --   --   PLT 329  --   --   --   --   --   --   HEPARINUNFRC  --   --  0.28*  --   --   --  0.34  CREATININE 0.67  --   --   --   --   --   --   TROPONINIHS 127*   < >  --  207* 186* 179*  --    < > = values in this interval not displayed.     Estimated Creatinine Clearance: 95.7 mL/min (by C-G formula based on SCr of 0.67 mg/dL).   Medical History: Past Medical History:  Diagnosis Date   Anxiety    Asthma    Blood transfusion without reported diagnosis    2010 CABG   CAD (coronary artery disease)    4v CABG, 9/10; NL LVF, status post followup Cardiolite November 2012 no ischemia ejection fraction 65%   Cataract    are forming   Diabetes mellitus    Dyslipidemia     LDL 22 mg percent on Crestor   Esophageal stricture     Fatty liver    Gastroparesis due to DM (HCC)    GERD (gastroesophageal reflux disease)    Glaucoma    Hyperlipidemia    Hypertension    Hypothyroidism    Knee injury, right, initial encounter    Left-sided chest wall pain    Myocardial infarction ([facility]) 2010   2008 x 2 stents, 2009 x 2 stents. 01-2009 CABG x 4      Assessment: 56 y/o F with cardiac history in the ED with "burning" in chest, mildly elevated trop. Pharmacy to dose heparin for ACS. Hgb and plts WNL. Heparin level came back at 0.34 (therapeutic). Spoke with RN no issues with the infusion or s/sx of bleeding.  Goal of Therapy:  Heparin level 0.3-0.7 units/ml Monitor platelets by anticoagulation protocol: Yes   Plan:  Continue heparin drip at 1400 units/hr Repeat heparin level in 6 hours  Daily CBC/Heparin level Monitor for bleeding  56, PharmD. [facility] Acute Care PGY-1 01/01/2022 11:45 AM

## 2022-01-01 NOTE — Progress Notes (Signed)
[facility] night cross cover note:   I was notified by RN that patient's qhs CBG demonstrated 424, prompting receipt of 5 units of NovoLog per sliding scale.  Repeat CBG showed interval trend down to 380.  I subsequently requested that the patient receive 5 additional units of NovoLog x1, with repeat CBG in 1 hour.  Result of repeat CBG reflects improving blood sugar, now down to 300.  will refrain from additional sliding scale insulin or Accu-Cheks leading up to scheduled AM CBG/sliding scale coverage, as ordered for the morning per existing qac/hs accuchecks.     Newton Pigg, DO Hospitalist

## 2022-01-01 NOTE — Progress Notes (Signed)
[facility] PROGRESS NOTE    Progress Note  [name]  WCH:[id number] DOB: 1966/04/30 DOA: 12/31/2021 [facility]CP: [facility]     Brief Narrative:   [name] is an 56 y.o. female past medical history significant for a CABG in 2010 history of stent placement obesity diabetes mellitus type 2 comes into the ED for worsening burning sensation of her chest started 3 days prior to admission twelve-lead EKG showed no acute abnormalities cardiac biomarkers were mildly elevated and she had a more leukocytosis it was discussed with cardiology who requested [facility].   Assessment/Plan:   Atypical chest pain with elevated troponins: Started on IV heparin, aspirin, change her atenolol to metoprolol. Cardiac biomarkers have basically been flat. 2D echo is pending. Continue nitro sublingual as needed for chest pain. Cardiology recommendations are pending.  Diabetes mellitus type 2, insulin dependent (HCC) Continue Neurontin, A1c 7.8. Hyperglycemic on admission. On long-acting insulin continue sliding scale and meal coverage.  GERD: EGD planned as an outpatient to stay for dilation. Continue PPI.  Hypothyroidism: Continue Synthroid.  Dyslipidemia: Continue Crestor.  Morbid obesity: With a BMI greater than 20 counseled.*   DVT prophylaxis: Heparin Family Communication:none Status is: Inpatient Remains inpatient appropriate because: Atypical chest pain and elevated troponins.    Code Status:     Code Status Orders  (From admission, onward)           Start     Ordered   12/31/21 0807  Full code  Continuous        12/31/21 0808           Code Status History     Date Active Date Inactive Code Status Order ID Comments User Context   04/17/2019 0303 04/21/2019 1718 Full Code [id number]  Onnie Boer, MD ED   04/13/2016 0931 04/16/2016 1711 Full Code [id number]  Russella Dar, NP Inpatient   08/26/2013 0959 08/27/2013  0430 Full Code [id number]  Andreas Newport, MD HOV         IV Access:   Peripheral IV   Procedures and diagnostic studies:   DG Chest Portable 1 View  Result Date: 12/31/2021 CLINICAL DATA:  Reflux symptoms for several days, initial encounter EXAM: PORTABLE CHEST 1 VIEW COMPARISON:  04/16/2019 FINDINGS: Cardiac shadow is stable. Postsurgical changes are again seen. The lungs are clear bilaterally. No bony abnormality is noted. IMPRESSION: No active disease. Electronically Signed   By: Alcide Clever M.D.   On: 12/31/2021 01:23     Medical Consultants:   None.   Subjective:    [name] she denies any chest pain or shortness of breath this morning.  Objective:    Vitals:   12/31/21 1700 12/31/21 1832 12/31/21 2215 01/01/22 0538  BP: (!) 103/91 (!) 141/71 131/64 130/67  Pulse: 81 87 80 86  Resp: 16 18 16 16   Temp:  98.1 F (36.7 C) 98.1 F (36.7 C) 97.8 F (36.6 C)  TempSrc:  Oral Oral Oral  SpO2: 99% 97% 97% 98%  Weight:  106.5 kg    Height:  5\' 4"  (1.626 m)     SpO2: 98 %   Intake/Output Summary (Last 24 hours) at 01/01/2022 0749 Last data filed at 01/01/2022 0600 Gross per 24 hour  Intake 480 ml  Output --  Net 480 ml   Filed Weights   12/31/21 0104 12/31/21 1832  Weight: 108.9 kg 106.5 kg    Exam: General exam: In no acute distress.  Respiratory system: Good air movement and clear to auscultation. Cardiovascular system: S1 & S2 heard, RRR. No JVD. Gastrointestinal system: Abdomen is nondistended, soft and nontender.  Extremities: No pedal edema. Skin: No rashes, lesions or ulcers Psychiatry: Judgement and insight appear normal. Mood & affect appropriate.    Data Reviewed:    Labs: Basic Metabolic Panel: Recent Labs  Lab 12/31/21 0203  NA 140  K 3.6  CL 108  CO2 24  GLUCOSE 157*  BUN 7  CREATININE 0.67  CALCIUM 9.6   GFR Estimated Creatinine Clearance: 95.7 mL/min (by C-G formula based on SCr of 0.67 mg/dL). Liver Function  Tests: No results for input(s): "AST", "ALT", "ALKPHOS", "BILITOT", "PROT", "ALBUMIN" in the last 168 hours. No results for input(s): "LIPASE", "AMYLASE" in the last 168 hours. No results for input(s): "AMMONIA" in the last 168 hours. Coagulation profile No results for input(s): "INR", "PROTIME" in the last 168 hours. COVID-19 Labs  No results for input(s): "DDIMER", "FERRITIN", "LDH", "CRP" in the last 72 hours.  Lab Results  Component Value Date   SARSCOV2NAA POSITIVE (A) 04/16/2019    CBC: Recent Labs  Lab 12/31/21 0203  WBC 11.3*  HGB 13.2  HCT 41.5  MCV 87.6  PLT 329   Cardiac Enzymes: No results for input(s): "CKTOTAL", "CKMB", "CKMBINDEX", "TROPONINI" in the last 168 hours. BNP (last 3 results) No results for input(s): "PROBNP" in the last 8760 hours. CBG: Recent Labs  Lab 12/31/21 1208 12/31/21 1723 12/31/21 2153 12/31/21 2253 01/01/22 0012  GLUCAP 329* 270* 424* 380* 300*   D-Dimer: No results for input(s): "DDIMER" in the last 72 hours. Hgb A1c: Recent Labs    12/31/21 0231  HGBA1C 7.8*   Lipid Profile: No results for input(s): "CHOL", "HDL", "LDLCALC", "TRIG", "CHOLHDL", "LDLDIRECT" in the last 72 hours. Thyroid function studies: No results for input(s): "TSH", "T4TOTAL", "T3FREE", "THYROIDAB" in the last 72 hours.  Invalid input(s): "FREET3" Anemia work up: No results for input(s): "VITAMINB12", "FOLATE", "FERRITIN", "TIBC", "IRON", "RETICCTPCT" in the last 72 hours. Sepsis Labs: Recent Labs  Lab 12/31/21 0203  WBC 11.3*   Microbiology No results found for this or any previous visit (from the past 240 hour(s)).   Medications:    aspirin  81 mg Oral Daily   atenolol  25 mg Oral Daily   fluticasone furoate-vilanterol  1 puff Inhalation Daily   And   umeclidinium bromide  1 puff Inhalation Daily   furosemide  40 mg Oral Daily   gabapentin  200 mg Oral QHS   insulin aspart  0-15 Units Subcutaneous TID WC   insulin aspart  0-5 Units  Subcutaneous QHS   levothyroxine  137 mcg Oral QAC breakfast   montelukast  10 mg Oral Daily   pantoprazole  40 mg Oral Daily   rosuvastatin  20 mg Oral Daily   sodium chloride flush  3 mL Intravenous Q12H   Continuous Infusions:  sodium chloride     heparin 1,400 Units/hr (12/31/21 2113)      LOS: 1 day   Marinda Elk  [facility]  01/01/2022, 7:49 AM

## 2022-01-02 DIAGNOSIS — I214 Non-ST elevation (NSTEMI) myocardial infarction: Secondary | ICD-10-CM | POA: Diagnosis not present

## 2022-01-02 DIAGNOSIS — E119 Type 2 diabetes mellitus without complications: Secondary | ICD-10-CM | POA: Diagnosis not present

## 2022-01-02 DIAGNOSIS — Z794 Long term (current) use of insulin: Secondary | ICD-10-CM | POA: Diagnosis not present

## 2022-01-02 DIAGNOSIS — I10 Essential (primary) hypertension: Secondary | ICD-10-CM | POA: Diagnosis not present

## 2022-01-02 LAB — GLUCOSE, CAPILLARY
Glucose-Capillary: 252 mg/dL — ABNORMAL HIGH (ref 70–99)
Glucose-Capillary: 245 mg/dL — ABNORMAL HIGH (ref 70–99)
Glucose-Capillary: 320 mg/dL — ABNORMAL HIGH (ref 70–99)
Glucose-Capillary: 389 mg/dL — ABNORMAL HIGH (ref 70–99)
Glucose-Capillary: 252 mg/dL — ABNORMAL HIGH (ref 70–99)
Glucose-Capillary: 145 mg/dL — ABNORMAL HIGH (ref 70–99)

## 2022-01-02 LAB — HEPARIN LEVEL (UNFRACTIONATED): Heparin Unfractionated: 0.25 IU/mL — ABNORMAL LOW (ref 0.30–0.70)

## 2022-01-02 MED ORDER — INSULIN ASPART 100 UNIT/ML IJ SOLN
6.0000 [IU] | Freq: Three times a day (TID) | INTRAMUSCULAR | Status: DC
  Administered 2022-01-02 – 2022-01-05 (×7): 6 [IU] via SUBCUTANEOUS

## 2022-01-02 MED ORDER — INSULIN ASPART 100 UNIT/ML IJ SOLN: 0.0000 [IU] | Freq: Three times a day (TID) | INTRAMUSCULAR | Status: DC

## 2022-01-02 MED ORDER — INSULIN ASPART 100 UNIT/ML IJ SOLN
0.0000 [IU] | Freq: Three times a day (TID) | INTRAMUSCULAR | Status: DC
  Administered 2022-01-02: 5 [IU] via SUBCUTANEOUS
  Administered 2022-01-02: 15 [IU] via SUBCUTANEOUS
  Administered 2022-01-02: 8 [IU] via SUBCUTANEOUS
  Administered 2022-01-03: 5 [IU] via SUBCUTANEOUS
  Administered 2022-01-04 (×2): 8 [IU] via SUBCUTANEOUS
  Administered 2022-01-04: 5 [IU] via SUBCUTANEOUS
  Administered 2022-01-05: 3 [IU] via SUBCUTANEOUS
  Administered 2022-01-05: 2 [IU] via SUBCUTANEOUS

## 2022-01-02 MED ORDER — ASPIRIN 81 MG PO CHEW
81.0000 mg | CHEWABLE_TABLET | ORAL | Status: AC
  Administered 2022-01-03: 81 mg via ORAL
  Filled 2022-01-02: qty 1

## 2022-01-02 MED ORDER — SODIUM CHLORIDE 0.9% FLUSH: 3.0000 mL | INTRAVENOUS | Status: DC | PRN

## 2022-01-02 MED ORDER — SODIUM CHLORIDE 0.9 % WEIGHT BASED INFUSION: 1.0000 mL/kg/h | INTRAVENOUS | Status: DC

## 2022-01-02 MED ORDER — INSULIN ASPART 100 UNIT/ML IJ SOLN
0.0000 [IU] | Freq: Every day | INTRAMUSCULAR | Status: DC
  Administered 2022-01-03: 2 [IU] via SUBCUTANEOUS

## 2022-01-02 MED ORDER — SODIUM CHLORIDE 0.9% FLUSH
3.0000 mL | Freq: Two times a day (BID) | INTRAVENOUS | Status: DC
  Administered 2022-01-03 – 2022-01-05 (×4): 3 mL via INTRAVENOUS

## 2022-01-02 MED ORDER — SODIUM CHLORIDE 0.9 % IV SOLN: 250.0000 mL | INTRAVENOUS | Status: DC | PRN

## 2022-01-02 MED ORDER — SODIUM CHLORIDE 0.9 % WEIGHT BASED INFUSION
3.0000 mL/kg/h | INTRAVENOUS | Status: AC
  Administered 2022-01-03: 3 mL/kg/h via INTRAVENOUS

## 2022-01-02 MED ORDER — INSULIN DETEMIR 100 UNIT/ML ~~LOC~~ SOLN
40.0000 [IU] | Freq: Two times a day (BID) | SUBCUTANEOUS | Status: DC
  Administered 2022-01-02 – 2022-01-05 (×6): 40 [IU] via SUBCUTANEOUS
  Filled 2022-01-02 (×10): qty 0.4

## 2022-01-02 NOTE — Progress Notes (Signed)
[facility] PROGRESS NOTE    Progress Note  [name]  JOA:[id number] DOB: 10-Jan-1966 DOA: 12/31/2021 PCP: [facility]     Brief Narrative:   [name] is an 56 y.o. female past medical history significant for a CABG in 2010 history of stent placement obesity diabetes mellitus type 2 comes into the ED for worsening burning sensation of her chest started 3 days prior to admission twelve-lead EKG showed no acute abnormalities cardiac biomarkers were mildly elevated and she had a more leukocytosis it was discussed with cardiology who requested transfer to [facility].   Assessment/Plan:   Atypical chest pain with elevated troponins: Started on IV heparin, aspirin, change her atenolol to metoprolol. Cardio biomarkers are trending down. 2D echo showed an EF of 60% with grade 1 diastolic heart failure Continue nitro sublingual as needed for chest pain. Cardiology recommendations are pending.  Diabetes mellitus type 2, insulin dependent (HCC) Continue Neurontin, A1c 7.8. Hyperglycemic on admission. On long-acting insulin continue sliding scale and meal coverage.  GERD: EGD planned as an outpatient to stay for dilation. Continue PPI.  Hypothyroidism: Continue Synthroid.  Dyslipidemia: Continue Crestor.  Morbid obesity: With a BMI greater than 20 counseled.*   DVT prophylaxis: Heparin Family Communication:none Status is: Inpatient Remains inpatient appropriate because: Atypical chest pain and elevated troponins.    Code Status:     Code Status Orders  (From admission, onward)           Start     Ordered   12/31/21 0807  Full code  Continuous        12/31/21 0808           Code Status History     Date Active Date Inactive Code Status Order ID Comments User Context   04/17/2019 0303 04/21/2019 1718 Full Code [id number]  Onnie Boer, MD ED   04/13/2016 0931 04/16/2016 1711 Full Code [id number]  Russella Dar, NP  Inpatient   08/26/2013 0959 08/27/2013 0430 Full Code [id number]  Andreas Newport, MD HOV         IV Access:   Peripheral IV   Procedures and diagnostic studies:   ECHOCARDIOGRAM COMPLETE  Result Date: 01/01/2022    ECHOCARDIOGRAM REPORT   Patient Name:   [name] Date of Exam: 01/01/2022 Medical Rec #:  [id number]        Height:       64.0 in Accession #:    [id number]       Weight:       234.8 lb Date of Birth:  [birth year]       BSA:          2.094 m Patient Age:    55 years         BP:           131/72 mmHg Patient Gender: F                HR:           77 bpm. Exam Location:  Inpatient Procedure: 2D Echo, Cardiac Doppler, Color Doppler and Intracardiac            Opacification Agent Indications:    Chest pain  History:        Patient has prior history of Echocardiogram examinations, most                 recent 08/15/2017. CHF, Prior CABG; Signs/Symptoms:Edema.  Sonographer:    Roosvelt Maser RDCS  Referring Phys: [place]  1. Left ventricular ejection fraction, by estimation, is 60 to 65%. The left ventricle has normal function. The left ventricle has no regional wall motion abnormalities. There is mild concentric left ventricular hypertrophy. Left ventricular diastolic parameters are consistent with Grade I diastolic dysfunction (impaired relaxation).  2. Right ventricular systolic function was not well visualized. The right ventricular size is normal.  3. The mitral valve is normal in structure. Mild mitral valve regurgitation. No evidence of mitral stenosis.  4. The aortic valve is tricuspid. Aortic valve regurgitation is not visualized. Aortic valve sclerosis is present, with no evidence of aortic valve stenosis.  5. The inferior vena cava is normal in size with greater than 50% respiratory variability, suggesting right atrial pressure of 3 mmHg. Comparison(s): Compared to prior echo report in 2019, there is no signfiicant change. FINDINGS  Left Ventricle: Left  ventricular ejection fraction, by estimation, is 60 to 65%. The left ventricle has normal function. The left ventricle has no regional wall motion abnormalities. The left ventricular internal cavity size was normal in size. There is  mild concentric left ventricular hypertrophy. Left ventricular diastolic parameters are consistent with Grade I diastolic dysfunction (impaired relaxation). Right Ventricle: The right ventricular size is normal. No increase in right ventricular wall thickness. Right ventricular systolic function was not well visualized. Left Atrium: Left atrial size was normal in size. Right Atrium: Right atrial size was normal in size. Pericardium: There is no evidence of pericardial effusion. Mitral Valve: The mitral valve is normal in structure. There is mild thickening of the mitral valve leaflet(s). Mild mitral valve regurgitation. No evidence of mitral valve stenosis. Tricuspid Valve: The tricuspid valve is normal in structure. Tricuspid valve regurgitation is trivial. Aortic Valve: The aortic valve is tricuspid. Aortic valve regurgitation is not visualized. Aortic valve sclerosis is present, with no evidence of aortic valve stenosis. Pulmonic Valve: The pulmonic valve was not well visualized. Pulmonic valve regurgitation is trivial. Aorta: The aortic root and ascending aorta are structurally normal, with no evidence of dilitation. Venous: The inferior vena cava is normal in size with greater than 50% respiratory variability, suggesting right atrial pressure of 3 mmHg. IAS/Shunts: The atrial septum is grossly normal.  LEFT VENTRICLE PLAX 2D LVIDd:         4.40 cm   Diastology LVIDs:         3.00 cm   LV e' medial:    8.05 cm/s LV PW:         1.20 cm   LV E/e' medial:  9.4 LV IVS:        1.20 cm   LV e' lateral:   12.60 cm/s LVOT diam:     2.10 cm   LV E/e' lateral: 6.0 LV SV:         62 LV SV Index:   29 LVOT Area:     3.46 cm  RIGHT VENTRICLE RV Basal diam:  3.50 cm LEFT ATRIUM           Index         RIGHT ATRIUM           Index LA diam:      3.70 cm 1.77 cm/m   RA Area:     20.70 cm LA Vol (A4C): 51.8 ml 24.73 ml/m  RA Volume:   55.40 ml  26.45 ml/m  AORTIC VALVE LVOT Vmax:   86.50 cm/s LVOT Vmean:  58.300 cm/s LVOT VTI:  0.178 m  AORTA Ao Root diam: 2.90 cm Ao Asc diam:  2.70 cm MITRAL VALVE MV Area (PHT): 3.99 cm    SHUNTS MV Decel Time: 190 msec    Systemic VTI:  0.18 m MV E velocity: 75.80 cm/s  Systemic Diam: 2.10 cm MV A velocity: 82.10 cm/s MV E/A ratio:  0.92 Gwyndolyn Kaufman MD Electronically signed by Gwyndolyn Kaufman MD Signature Date/Time: 01/01/2022/5:23:22 PM    Final      Medical Consultants:   None.   Subjective:    [name] denies any chest pain or shortness of breath  Objective:    Vitals:   01/01/22 1120 01/01/22 2050 01/02/22 0050 01/02/22 0610  BP: 131/72 127/60 (!) 119/46 137/71  Pulse: 72 74 70 72  Resp: 17 18 17 16   Temp: 97.8 F (36.6 C) 97.8 F (36.6 C) 98.1 F (36.7 C) 98.1 F (36.7 C)  TempSrc: Oral Oral Oral Oral  SpO2: 100% 98% 98% 96%  Weight:      Height:       SpO2: 96 %   Intake/Output Summary (Last 24 hours) at 01/02/2022 0717 Last data filed at 01/02/2022 0336 Gross per 24 hour  Intake 894.01 ml  Output --  Net 894.01 ml    Filed Weights   12/31/21 0104 12/31/21 1832  Weight: 108.9 kg 106.5 kg    Exam: General exam: In no acute distress. Respiratory system: Good air movement and clear to auscultation. Cardiovascular system: S1 & S2 heard, RRR. No JVD. Gastrointestinal system: Abdomen is nondistended, soft and nontender.  Extremities: No pedal edema. Skin: No rashes, lesions or ulcers Psychiatry: Judgement and insight appear normal. Mood & affect appropriate. Data Reviewed:    Labs: Basic Metabolic Panel: Recent Labs  Lab 12/31/21 0203  NA 140  K 3.6  CL 108  CO2 24  GLUCOSE 157*  BUN 7  CREATININE 0.67  CALCIUM 9.6    GFR Estimated Creatinine Clearance: 95.7 mL/min (by C-G formula  based on SCr of 0.67 mg/dL). Liver Function Tests: No results for input(s): "AST", "ALT", "ALKPHOS", "BILITOT", "PROT", "ALBUMIN" in the last 168 hours. No results for input(s): "LIPASE", "AMYLASE" in the last 168 hours. No results for input(s): "AMMONIA" in the last 168 hours. Coagulation profile No results for input(s): "INR", "PROTIME" in the last 168 hours. COVID-19 Labs  No results for input(s): "DDIMER", "FERRITIN", "LDH", "CRP" in the last 72 hours.  Lab Results  Component Value Date   SARSCOV2NAA POSITIVE (A) 04/16/2019    CBC: Recent Labs  Lab 12/31/21 0203  WBC 11.3*  HGB 13.2  HCT 41.5  MCV 87.6  PLT 329    Cardiac Enzymes: No results for input(s): "CKTOTAL", "CKMB", "CKMBINDEX", "TROPONINI" in the last 168 hours. BNP (last 3 results) No results for input(s): "PROBNP" in the last 8760 hours. CBG: Recent Labs  Lab 01/01/22 0012 01/01/22 0801 01/01/22 1119 01/01/22 1709 01/01/22 2048  GLUCAP 300* 270* 335* 257* 266*    D-Dimer: No results for input(s): "DDIMER" in the last 72 hours. Hgb A1c: Recent Labs    12/31/21 0231  HGBA1C 7.8*    Lipid Profile: No results for input(s): "CHOL", "HDL", "LDLCALC", "TRIG", "CHOLHDL", "LDLDIRECT" in the last 72 hours. Thyroid function studies: No results for input(s): "TSH", "T4TOTAL", "T3FREE", "THYROIDAB" in the last 72 hours.  Invalid input(s): "FREET3" Anemia work up: No results for input(s): "VITAMINB12", "FOLATE", "FERRITIN", "TIBC", "IRON", "RETICCTPCT" in the last 72 hours. Sepsis Labs: Recent Labs  Lab 12/31/21 0203  WBC 11.3*    Microbiology No results found for this or any previous visit (from the past 240 hour(s)).   Medications:    aspirin  81 mg Oral Daily   fluticasone furoate-vilanterol  1 puff Inhalation Daily   And   umeclidinium bromide  1 puff Inhalation Daily   furosemide  40 mg Oral Daily   gabapentin  200 mg Oral QHS   insulin aspart  0-15 Units Subcutaneous TID WC    insulin aspart  0-5 Units Subcutaneous QHS   insulin aspart  6 Units Subcutaneous TID WC   insulin detemir  20 Units Subcutaneous BID   levothyroxine  137 mcg Oral QAC breakfast   metoprolol tartrate  25 mg Oral BID   montelukast  10 mg Oral Daily   pantoprazole  40 mg Oral Daily   rosuvastatin  20 mg Oral Daily   sodium chloride flush  3 mL Intravenous Q12H   Continuous Infusions:  sodium chloride     heparin 1,400 Units/hr (01/02/22 0706)      LOS: 2 days   Marinda Elk  [facility]  01/02/2022, 7:17 AM

## 2022-01-02 NOTE — Progress Notes (Signed)
ANTICOAGULATION CONSULT NOTE - Follow Up  Pharmacy Consult for Heparin  Indication: chest pain/ACS  Allergies  Allergen Reactions   Metformin Anaphylaxis   Penicillins Anaphylaxis and Other (See Comments)    Has patient had a PCN reaction causing immediate rash, facial/tongue/throat swelling, SOB or lightheadedness with hypotension: Yes Has patient had a PCN reaction causing severe rash involving mucus membranes or skin necrosis: No Has patient had a PCN reaction that required hospitalization No Has patient had a PCN reaction occurring within the last 10 years: No If all of the above answers are "NO", then may proceed with Cephalosporin use.   Metoclopramide Other (See Comments)    Reaction:  Nightmares     Patient Measurements: Height: 5\' 4"  (162.6 cm) Weight: 106.5 kg (234 lb 12.8 oz) IBW/kg (Calculated) : 54.7  Vital Signs: Temp: 98.1 F (36.7 C) (08/14 0610) Temp Source: Oral (08/14 0610) BP: 134/74 (08/14 0927) Pulse Rate: 75 (08/14 0927)  Labs: Recent Labs    12/31/21 0203 12/31/21 0403 01/01/22 0209 01/01/22 0947 01/01/22 1501 01/02/22 0658  HGB 13.2  --  14.1  --   --   --   HCT 41.5  --  42.2  --   --   --   PLT 329  --  374  --   --   --   HEPARINUNFRC  --    < > 0.35 0.34  --  0.25*  CREATININE 0.67  --  0.71  --   --   --   TROPONINIHS 127*   < > 179* 116* 137*  --    < > = values in this interval not displayed.     Estimated Creatinine Clearance: 95.7 mL/min (by C-G formula based on SCr of 0.71 mg/dL).   Medical History: Past Medical History:  Diagnosis Date   Anxiety    Asthma    Blood transfusion without reported diagnosis    2010 CABG   CAD (coronary artery disease)    4v CABG, 9/10; NL LVF, status post followup Cardiolite November 2012 no ischemia ejection fraction 65%   Cataract    are forming   Diabetes mellitus    Dyslipidemia     LDL 22 mg percent on Crestor   Esophageal stricture    Fatty liver    Gastroparesis due to DM (HCC)     GERD (gastroesophageal reflux disease)    Glaucoma    Hyperlipidemia    Hypertension    Hypothyroidism    Knee injury, right, initial encounter    Left-sided chest wall pain    Myocardial infarction (HCC) 2010   2008 x 2 stents, 2009 x 2 stents. 01-2009 CABG x 4      Assessment: 56 y/o F with cardiac history in the ED with "burning" in chest, mildly elevated trop. Pharmacy to dose heparin for ACS.   -Heparin level 0.25 (subtherapeutic)  Goal of Therapy:  Heparin level 0.3-0.7 units/ml Monitor platelets by anticoagulation protocol: Yes   Plan:  -Increase heparin to 1550 units/hr -heparin level and CBC in am  58, PharmD Clinical Pharmacist **Pharmacist phone directory can now be found on [email or web address] ([email or web address]).  Listed under [facility].

## 2022-01-02 NOTE — Care Management (Signed)
  Transition of Care [facility]) Screening Note   Patient Details  Name: [name] Date of Birth: 13-Sep-1965   Transition of Care [facility]) CM/SW Contact:    Gala Lewandowsky, RN Phone Number: 01/02/2022, 11:53 AM    Transition of Care Department [facility]) has reviewed the patient and no TOC needs have been identified at this time. We will continue to monitor patient advancement through interdisciplinary progression rounds. If new patient transition needs arise, please place a TOC consult.

## 2022-01-02 NOTE — Consult Note (Addendum)
Cardiology Consultation:   Patient ID: ALLINA RICHES MRN: [id number]; DOB: 11-27-1965  Admit date: 12/31/2021 Date of Consult: 01/02/2022  PCP:  [facility]   [facility] Providers Cardiologist:  Rollene Rotunda, MD     Patient Profile:   Kim FARNWORTH is a 56 y.o. female with a hx of CAD status post 4v CABG (LIMA to LAD, SVG to diagonal, SVG to OM, SVG to PDA), hypertension, hyperlipidemia, diabetes, GERD, morbid obesity, hypothyroidism who is being seen 01/02/2022 for the evaluation of chest pain at the request of Dr. David Stall  History of Present Illness:   [name] is a 56 year old female with past medical history noted above.  She is followed by Dr. Antoine Poche as an outpatient.  Underwent four-vessel CABG in 2010 with normal LVEF at that time.  Underwent cardiac catheterization 2014 with all of her bypass grafts patent at that time.  Echocardiogram 05/2017 showed LVEF of 60 to 65%.  Had a stress test 04/2021 was low risk with no perfusion defects.  Was seen in the office 06/2021 with main complaint being knee pain, scheduled to have outpatient knee surgery.  Did report some chest tightness from time to time.  Was noted to have a low functional capacity secondary to her knee pain.  She was set up for repeat nuclear stress test which was done 07/20/2021 and low risk with normal systolic function.  Notes indicate she has been statin intolerant and it was recommended she be seen in the lipid clinic to consider initiation of PCSK9 inhibitor.   She was seen in the lipid clinic 08/2021 and agreeable to resume her rosuvastatin, as well as started on Praluent.   She presented to the ED on 8/12 with complaints of chest pain.  She reports for several days prior to admission she had been having a centralized burning sensation in her chest.  This was prolonged for several hours at a time when present.  No shortness of breath, dizziness, palpitations or nausea or vomiting.  Labs in  the ED showed sodium 141, potassium 3.6, creatinine 0.6, high-sensitivity troponin 127>>178>>188>>207, WBC 11.3, hemoglobin 13.2.  EKG showed sinus rhythm, 72 bpm, borderline prolonged PR interval.  Chest x-ray negative.  She was started on IV heparin and transferred to [facility] for [facility] evaluation.  Echocardiogram 8/13 with LVEF of 60 to 65%, no regional wall motion abnormality, grade 1 diastolic dysfunction, normal RV size.  Cardiology now asked to evaluate.  In talking with patient she reports this feels different from her prior anginal symptoms.  Episodes are not intense and prior anginal symptoms were more of a chest pain/pressure with dyspnea whereas this is a burning sensation.  She was scheduled to have an outpatient EGD for esophageal dilatation 8/15 which she reports is done on a yearly basis.  States that the burning sensation did feel very similar to her usual GERD symptoms.   Also reports she has been following in a weight loss clinic and is down 20 pounds.  Also follows with endocrinology for her diabetes and most recent A1c was 8.2 at visit 12/07/2021.  She is currently on insulin pump.  Past Medical History:  Diagnosis Date   Anxiety    Asthma    Blood transfusion without reported diagnosis    2010 CABG   CAD (coronary artery disease)    4v CABG, 9/10; NL LVF, status post followup Cardiolite November 2012 no ischemia ejection fraction 65%   Cataract    are forming  Diabetes mellitus    Dyslipidemia     LDL 22 mg percent on Crestor   Esophageal stricture    Fatty liver    Gastroparesis due to DM (HCC)    GERD (gastroesophageal reflux disease)    Glaucoma    Hyperlipidemia    Hypertension    Hypothyroidism    Knee injury, right, initial encounter    Left-sided chest wall pain    Myocardial infarction [facility]) 2010   2008 x 2 stents, 2009 x 2 stents. 01-2009 CABG x 4     Past Surgical History:  Procedure Laterality Date   ANGIOPLASTY     with stenting 2008,2009    CARDIAC SURGERY     CESAREAN SECTION     CORONARY ARTERY BYPASS GRAFT  01/26/2009   2008-2 stents; 2009-2 stents   EYE SURGERY     open heart surgery   2010   TUBAL LIGATION       Home Medications:  Prior to Admission medications   Medication Sig Start Date End Date Taking? Authorizing Provider  albuterol (VENTOLIN HFA) 108 (90 Base) MCG/ACT inhaler Inhale 2 puffs into the lungs every 6 (six) hours as needed for wheezing or shortness of breath. 04/21/19  Yes Oswald Hillock, MD  ALPRAZolam Duanne Moron) 0.25 MG tablet Take 0.25 mg by mouth 2 (two) times daily as needed for anxiety or sleep.  11/21/17  Yes [provider]  ammonium lactate (AMLACTIN) 12 % lotion Apply 1 application topically as needed for dry skin. 12/10/20  Yes Felipa Furnace, DPM  atenolol (TENORMIN) 25 MG tablet Take 1 tablet (25 mg total) by mouth daily. 11/30/21  Yes Minus Breeding, MD  Cholecalciferol (VITAMIN D3) 125 MCG (5000 UT) CAPS Take 1 tablet by mouth daily.   Yes [provider]  ciclopirox (PENLAC) 8 % solution APPLY TOPICALLY AT BEDTIME. APPLY OVER NAIL AND SURROUNDING SKIN. APPLY DAILY OVER PREVIOUS COAT. AFTER SEVEN (7) DAYS, MAY REMOVE WITH ALCOHOL AND CONTINUE CYCLE. 12/10/20  Yes Felipa Furnace, DPM  cyclobenzaprine (FLEXERIL) 10 MG tablet Take 10 mg by mouth 3 (three) times daily. 08/16/21  Yes [provider]  Fluticasone-Umeclidin-Vilant (TRELEGY ELLIPTA) 100-62.5-25 MCG/INH AEPB Inhale into the lungs daily.   Yes [provider]  ibuprofen (ADVIL) 800 MG tablet TAKE 1 TABLET BY MOUTH EVERY 8 HOURS AS NEEDED Patient taking differently: Take 400 mg by mouth daily as needed for moderate pain. 06/21/21  Yes Carole Civil, MD  Insulin Human (INSULIN PUMP) SOLN Inject into the skin See admin instructions. Pt uses up to 150 units of NOVOLOG daily VIA PUMP   Yes [provider]  lansoprazole (PREVACID SOLUTAB) 30 MG disintegrating tablet Take 1 tablet (30 mg total) by mouth  daily. 08/22/21  Yes Esterwood, Amy S, PA-C  levothyroxine (SYNTHROID) 137 MCG tablet Take 137 mcg by mouth daily before breakfast.   Yes [provider]  liraglutide (VICTOZA) 18 MG/3ML SOPN Inject 1.8 mg into the skin daily.   Yes [provider]  NOVOLOG 100 UNIT/ML injection Inject 100-150 Units into the skin as directed. Use up to 100-150 units daily in insulin pump as needed 11/23/17  Yes [provider]  oxyCODONE-acetaminophen (PERCOCET/ROXICET) 5-325 MG tablet Take 1 tablet by mouth 3 (three) times daily as needed.   Yes [provider]  prednisoLONE (PRELONE) 15 MG/5ML SOLN Take 15 mg by mouth 2 (two) times daily. 11/02/20  Yes [provider]  Pseudoeph-Doxylamine-DM-APAP (NYQUIL PO) Take 30 mLs by  mouth at bedtime as needed (for cough).   Yes [provider]  rosuvastatin (CRESTOR) 20 MG tablet Take 1 tablet (20 mg total) by mouth daily. 08/23/21 12/31/21 Yes Aveleen Nevers, Jeneen Rinks, MD  triamcinolone ointment (KENALOG) 0.1 % Apply 1 application topically daily as needed. 11/02/20  Yes [provider]  Alirocumab (PRALUENT) 150 MG/ML SOAJ Inject 150 mg into the skin every 14 (fourteen) days. 08/23/21   Minus Breeding, MD  furosemide (LASIX) 40 MG tablet TAKE 1 TABLET BY MOUTH EVERY DAY Patient not taking: Reported on 12/31/2021 11/17/19   Herminio Commons, MD  Magnesium Oxide 500 MG CAPS Take 1 capsule by mouth every morning. Patient not taking: Reported on 12/31/2021 04/07/19   [provider]  Misc. Devices (CANE) MISC 1 standard cane for standing and ambulation.  Diagnosis right knee pain. 05/18/21   Triplett, Tammy, PA-C  nitroGLYCERIN (NITROSTAT) 0.4 MG SL tablet Place 1 tablet (0.4 mg total) under the tongue every 5 (five) minutes x 3 doses as needed for chest pain (if no relief after 2nd dose, proceed to the ED for an evaluation or call 911). Patient not taking: Reported on 12/31/2021 04/23/20   Minus Breeding, MD     Inpatient Medications: Scheduled Meds:  aspirin  81 mg Oral Daily   fluticasone furoate-vilanterol  1 puff Inhalation Daily   And   umeclidinium bromide  1 puff Inhalation Daily   furosemide  40 mg Oral Daily   gabapentin  200 mg Oral QHS   insulin aspart  0-15 Units Subcutaneous TID WC   insulin aspart  0-5 Units Subcutaneous QHS   insulin aspart  6 Units Subcutaneous TID WC   insulin detemir  40 Units Subcutaneous BID   levothyroxine  137 mcg Oral QAC breakfast   metoprolol tartrate  25 mg Oral BID   montelukast  10 mg Oral Daily   pantoprazole  40 mg Oral Daily   rosuvastatin  20 mg Oral Daily   sodium chloride flush  3 mL Intravenous Q12H   Continuous Infusions:  sodium chloride     heparin 1,550 Units/hr (01/02/22 1129)   PRN Meds: sodium chloride, acetaminophen **OR** acetaminophen, albuterol, ALPRAZolam, nitroGLYCERIN, nitroGLYCERIN, ondansetron **OR** ondansetron (ZOFRAN) IV, mouth rinse, oxyCODONE-acetaminophen, sodium chloride flush  Allergies:    Allergies  Allergen Reactions   Metformin Anaphylaxis   Penicillins Anaphylaxis and Other (See Comments)    Has patient had a PCN reaction causing immediate rash, facial/tongue/throat swelling, SOB or lightheadedness with hypotension: Yes Has patient had a PCN reaction causing severe rash involving mucus membranes or skin necrosis: No Has patient had a PCN reaction that required hospitalization No Has patient had a PCN reaction occurring within the last 10 years: No If all of the above answers are "NO", then may proceed with Cephalosporin use.   Metoclopramide Other (See Comments)    Reaction:  Nightmares     Social History:   Social History   Socioeconomic History   Marital status: Single    Spouse name: Not on file   Number of children: 1   Years of education: Not on file   Highest education level: Not on file  Occupational History   Occupation: disabilied  Tobacco Use   Smoking status: Former     Packs/day: 1.00    Years: 3.00    Total pack years: 3.00    Types: Cigarettes    Start date: 03/14/1991    Quit date: 05/22/1993    Years since quitting: 35.6  Smokeless tobacco: Never   Tobacco comments:     Year Quit: 1995  Vaping Use   Vaping Use: Never used  Substance and Sexual Activity   Alcohol use: No    Alcohol/week: 0.0 standard drinks of alcohol   Drug use: No   Sexual activity: Yes    Birth control/protection: Surgical, Post-menopausal    Comment: tubal  Other Topics Concern   Not on file  Social History Narrative   [facility] (03/19/17):   Originally from [place], [place]. Has always lived in [place]. Previously worked at [employer] She is disabled due to her prior cardiac history. 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 No pets currently. No bird exposure. She reports she did have mold in a prior home that she lived in for 3 years. She has since moved to a new home. She enjoys walking.    Social Determinants of Health   Financial Resource Strain: Medium Risk (12/14/2020)   Overall Financial Resource Strain (CARDIA)    Difficulty of Paying Living Expenses: Somewhat hard  Food Insecurity: Food Insecurity Present (12/14/2020)   Hunger Vital Sign    Worried About Running Out of Food in the Last Year: Sometimes true    Ran Out of Food in the Last Year: Sometimes true  Transportation Needs: No Transportation Needs (12/14/2020)   PRAPARE - Hydrologist (Medical): No    Lack of Transportation (Non-Medical): No  Physical Activity: Insufficiently Active (12/14/2020)   Exercise Vital Sign    Days of Exercise per Week: 3 days    Minutes of Exercise per Session: 30 min  Stress: No Stress Concern Present (12/14/2020)   [place]    Feeling of Stress : Only a little  Social Connections: Moderately Integrated (12/14/2020)   Social  Connection and Isolation Panel [NHANES]    Frequency of Communication with Friends and Family: More than three times a week    Frequency of Social Gatherings with Friends and Family: Once a week    Attends Religious Services: More than 4 times per year    Active Member of [employer] or Organizations: Yes    Attends Archivist Meetings: 1 to 4 times per year    Marital Status: Divorced  Human resources officer Violence: Not At Risk (12/14/2020)   Humiliation, Afraid, Rape, and Kick questionnaire    Fear of Current or Ex-Partner: No    Emotionally Abused: No    Physically Abused: No    Sexually Abused: No    Family History:    Family History  Problem Relation Age of Onset   Heart disease Father    Asthma Father    COPD Father    Diabetes Mother    Asthma Paternal Aunt    Asthma Son    Colon cancer Neg Hx    Colon polyps Neg Hx    Esophageal cancer Neg Hx    Rectal cancer Neg Hx    Stomach cancer Neg Hx      ROS:  Please see the history of present illness.   All other ROS reviewed and negative.     Physical Exam/Data:   Vitals:   01/01/22 2050 01/02/22 0050 01/02/22 0610 01/02/22 0927  BP: 127/60 (!) 119/46 137/71 134/74  Pulse: 74 70 72 75  Resp: 18 17 16    Temp: 97.8 F (36.6 C) 98.1 F (36.7 C) 98.1 F (36.7  C)   TempSrc: Oral Oral Oral   SpO2: 98% 98% 96%   Weight:      Height:        Intake/Output Summary (Last 24 hours) at 01/02/2022 1331 Last data filed at 01/02/2022 1300 Gross per 24 hour  Intake 1632.01 ml  Output --  Net 1632.01 ml      12/31/2021    6:32 PM 12/31/2021    1:04 AM 08/23/2021    8:54 AM  Last 3 Weights  Weight (lbs) 234 lb 12.8 oz 240 lb 244 lb 12.8 oz  Weight (kg) 106.505 kg 108.863 kg 111.041 kg     Body mass index is 40.3 kg/m.  General:  Well nourished, well developed, in no acute distress HEENT: normal Neck: no JVD Vascular: No carotid bruits; Distal pulses 2+ bilaterally Cardiac:  normal S1, S2; RRR; no murmur  Lungs: Mild  expiratory wheeze Abd: soft, nontender, no hepatomegaly  Ext: no edema Musculoskeletal:  No deformities, BUE and BLE strength normal and equal Skin: warm and dry  Neuro:  CNs 2-12 intact, no focal abnormalities noted Psych:  Normal affect   EKG:  The EKG was personally reviewed and demonstrates: sinus rhythm, 72 bpm, borderline prolonged PR interval  Telemetry:  Telemetry was personally reviewed and demonstrates:  Sinus Rhythm  Relevant CV Studies:  Echo: 01/01/2022  IMPRESSIONS     1. Left ventricular ejection fraction, by estimation, is 60 to 65%. The  left ventricle has normal function. The left ventricle has no regional  wall motion abnormalities. There is mild concentric left ventricular  hypertrophy. Left ventricular diastolic  parameters are consistent with Grade I diastolic dysfunction (impaired  relaxation).   2. Right ventricular systolic function was not well visualized. The right  ventricular size is normal.   3. The mitral valve is normal in structure. Mild mitral valve  regurgitation. No evidence of mitral stenosis.   4. The aortic valve is tricuspid. Aortic valve regurgitation is not  visualized. Aortic valve sclerosis is present, with no evidence of aortic  valve stenosis.   5. The inferior vena cava is normal in size with greater than 50%  respiratory variability, suggesting right atrial pressure of 3 mmHg.   Comparison(s): Compared to prior echo report in 2019, there is no  signfiicant change.   FINDINGS   Left Ventricle: Left ventricular ejection fraction, by estimation, is 60  to 65%. The left ventricle has normal function. The left ventricle has no  regional wall motion abnormalities. The left ventricular internal cavity  size was normal in size. There is   mild concentric left ventricular hypertrophy. Left ventricular diastolic  parameters are consistent with Grade I diastolic dysfunction (impaired  relaxation).   Right Ventricle: The right  ventricular size is normal. No increase in  right ventricular wall thickness. Right ventricular systolic function was  not well visualized.   Left Atrium: Left atrial size was normal in size.   Right Atrium: Right atrial size was normal in size.   Pericardium: There is no evidence of pericardial effusion.   Mitral Valve: The mitral valve is normal in structure. There is mild  thickening of the mitral valve leaflet(s). Mild mitral valve  regurgitation. No evidence of mitral valve stenosis.   Tricuspid Valve: The tricuspid valve is normal in structure. Tricuspid  valve regurgitation is trivial.   Aortic Valve: The aortic valve is tricuspid. Aortic valve regurgitation is  not visualized. Aortic valve sclerosis is present, with no evidence of  aortic  valve stenosis.   Pulmonic Valve: The pulmonic valve was not well visualized. Pulmonic valve  regurgitation is trivial.   Aorta: The aortic root and ascending aorta are structurally normal, with  no evidence of dilitation.   Venous: The inferior vena cava is normal in size with greater than 50%  respiratory variability, suggesting right atrial pressure of 3 mmHg.   IAS/Shunts: The atrial septum is grossly normal.   Laboratory Data:  High Sensitivity Troponin:   Recent Labs  Lab 12/31/21 1540 12/31/21 2044 01/01/22 0209 01/01/22 0947 01/01/22 1501  TROPONINIHS 207* 186* 179* 116* 137*     Chemistry Recent Labs  Lab 12/31/21 0203 01/01/22 0209  NA 140 137  K 3.6 3.9  CL 108 103  CO2 24 25  GLUCOSE 157* 222*  BUN 7 7  CREATININE 0.67 0.71  CALCIUM 9.6 10.1  MG  --  2.0  GFRNONAA >60 >60  ANIONGAP 8 9    No results for input(s): "PROT", "ALBUMIN", "AST", "ALT", "ALKPHOS", "BILITOT" in the last 168 hours. Lipids No results for input(s): "CHOL", "TRIG", "HDL", "LABVLDL", "LDLCALC", "CHOLHDL" in the last 168 hours.  Hematology Recent Labs  Lab 12/31/21 0203 01/01/22 0209  WBC 11.3* 12.2*  RBC 4.74 5.04  HGB 13.2  14.1  HCT 41.5 42.2  MCV 87.6 83.7  MCH 27.8 28.0  MCHC 31.8 33.4  RDW 15.1 15.0  PLT 329 374   Thyroid No results for input(s): "TSH", "FREET4" in the last 168 hours.  BNPNo results for input(s): "BNP", "PROBNP" in the last 168 hours.  DDimer No results for input(s): "DDIMER" in the last 168 hours.   Radiology/Studies:  ECHOCARDIOGRAM COMPLETE  Result Date: 01/01/2022    ECHOCARDIOGRAM REPORT   Patient Name:   [name] Date of Exam: 01/01/2022 Medical Rec #:  [id number]        Height:       64.0 in Accession #:    [id number]       Weight:       234.8 lb Date of Birth:  [birth year]       BSA:          2.094 m Patient Age:    55 years         BP:           131/72 mmHg Patient Gender: F                HR:           77 bpm. Exam Location:  Inpatient Procedure: 2D Echo, Cardiac Doppler, Color Doppler and Intracardiac            Opacification Agent Indications:    Chest pain  History:        Patient has prior history of Echocardiogram examinations, most                 recent 08/15/2017. CHF, Prior CABG; Signs/Symptoms:Edema.  Sonographer:    Roosvelt Maser RDCS Referring Phys: [phone] ABRAHAM FELIZ ORTIZ IMPRESSIONS  1. Left ventricular ejection fraction, by estimation, is 60 to 65%. The left ventricle has normal function. The left ventricle has no regional wall motion abnormalities. There is mild concentric left ventricular hypertrophy. Left ventricular diastolic parameters are consistent with Grade I diastolic dysfunction (impaired relaxation).  2. Right ventricular systolic function was not well visualized. The right ventricular size is normal.  3. The mitral valve is normal in structure. Mild mitral valve regurgitation. No evidence of mitral stenosis.  4. The aortic valve is tricuspid. Aortic valve regurgitation is not visualized. Aortic valve sclerosis is present, with no evidence of aortic valve stenosis.  5. The inferior vena cava is normal in size with greater than 50% respiratory variability,  suggesting right atrial pressure of 3 mmHg. Comparison(s): Compared to prior echo report in 2019, there is no signfiicant change. FINDINGS  Left Ventricle: Left ventricular ejection fraction, by estimation, is 60 to 65%. The left ventricle has normal function. The left ventricle has no regional wall motion abnormalities. The left ventricular internal cavity size was normal in size. There is  mild concentric left ventricular hypertrophy. Left ventricular diastolic parameters are consistent with Grade I diastolic dysfunction (impaired relaxation). Right Ventricle: The right ventricular size is normal. No increase in right ventricular wall thickness. Right ventricular systolic function was not well visualized. Left Atrium: Left atrial size was normal in size. Right Atrium: Right atrial size was normal in size. Pericardium: There is no evidence of pericardial effusion. Mitral Valve: The mitral valve is normal in structure. There is mild thickening of the mitral valve leaflet(s). Mild mitral valve regurgitation. No evidence of mitral valve stenosis. Tricuspid Valve: The tricuspid valve is normal in structure. Tricuspid valve regurgitation is trivial. Aortic Valve: The aortic valve is tricuspid. Aortic valve regurgitation is not visualized. Aortic valve sclerosis is present, with no evidence of aortic valve stenosis. Pulmonic Valve: The pulmonic valve was not well visualized. Pulmonic valve regurgitation is trivial. Aorta: The aortic root and ascending aorta are structurally normal, with no evidence of dilitation. Venous: The inferior vena cava is normal in size with greater than 50% respiratory variability, suggesting right atrial pressure of 3 mmHg. IAS/Shunts: The atrial septum is grossly normal.  LEFT VENTRICLE PLAX 2D LVIDd:         4.40 cm   Diastology LVIDs:         3.00 cm   LV e' medial:    8.05 cm/s LV PW:         1.20 cm   LV E/e' medial:  9.4 LV IVS:        1.20 cm   LV e' lateral:   12.60 cm/s LVOT diam:      2.10 cm   LV E/e' lateral: 6.0 LV SV:         62 LV SV Index:   29 LVOT Area:     3.46 cm  RIGHT VENTRICLE RV Basal diam:  3.50 cm LEFT ATRIUM           Index        RIGHT ATRIUM           Index LA diam:      3.70 cm 1.77 cm/m   RA Area:     20.70 cm LA Vol (A4C): 51.8 ml 24.73 ml/m  RA Volume:   55.40 ml  26.45 ml/m  AORTIC VALVE LVOT Vmax:   86.50 cm/s LVOT Vmean:  58.300 cm/s LVOT VTI:    0.178 m  AORTA Ao Root diam: 2.90 cm Ao Asc diam:  2.70 cm MITRAL VALVE MV Area (PHT): 3.99 cm    SHUNTS MV Decel Time: 190 msec    Systemic VTI:  0.18 m MV E velocity: 75.80 cm/s  Systemic Diam: 2.10 cm MV A velocity: 82.10 cm/s MV E/A ratio:  0.92 Gwyndolyn Kaufman MD Electronically signed by Gwyndolyn Kaufman MD Signature Date/Time: 01/01/2022/5:23:22 PM    Final    DG Chest Portable 1 View  Result Date:  12/31/2021 CLINICAL DATA:  Reflux symptoms for several days, initial encounter EXAM: PORTABLE CHEST 1 VIEW COMPARISON:  04/16/2019 FINDINGS: Cardiac shadow is stable. Postsurgical changes are again seen. The lungs are clear bilaterally. No bony abnormality is noted. IMPRESSION: No active disease. Electronically Signed   By: Inez Catalina M.D.   On: 12/31/2021 01:23     Assessment and Plan:   RAINBOW NAIDU is a 56 y.o. female with a hx of CAD status post 4v CABG (LIMA to LAD, SVG to diagonal, SVG to OM, SVG to PDA), hypertension, hyperlipidemia, diabetes, GERD, morbid obesity, hypothyroidism who is being seen 01/02/2022 for the evaluation of chest pain at the request of Dr. Aileen Fass  Atypical chest pain Elevated Troponin CAD s/p 4v CABG  -- Last cardiac catheterization 2014 with patent grafts.  Last nuclear stress test 07/2021 which was low risk with no ischemia.  Presented with several days of centralized chest burning to [facility], ED.  High-sensitivity troponins were cycled and noted in the 100-200 range.  EKG without ischemia.  She has been treated with IV heparin.  Echocardiogram was done and showed  LVEF of 60 to 65%, no regional wall motion abnormality, grade 1 diastolic dysfunction.  She reports symptoms are quite similar to what she is experienced with her reflux in the past, and is due to have another EGD with esophageal dilatation tomorrow.  Though unclear why her troponins were elevated. Was able to ambulate in the hallway yesterday without chest pain, but felt fatigued. With recent low risk nuclear stress test, likely needs definitive cath. Will review with MD. -- on ASA, IV heparin, BB, statin (praluent)  HTN: Well-controlled -- Continue metoprolol 25 mg twice daily  HLD: No recent lipid panel -- On Crestor 20 mg daily as well as Praluent  DM: Hemoglobin A1c 7.8 -- Follows with endocrine as an outpatient -- Currently using insulin pump  Per Primary GERD Morbid obesity Hypothyroidism   For questions or updates, please contact [facility] Please consult [email or web address] for contact info under    Signed, Reino Bellis, NP  01/02/2022 1:31 PM  History and all data above reviewed.  Patient examined.  I agree with the findings as above.   The patient has a long history of coronary artery disease as described above.  She presents with chest discomfort that is substernal and burning.  There are no associated symptoms.  There is no radiation.  Is been going on for about 3 days off and on.  It was increased with exertion.  She said there is some elements of it that are consistent with her previous angina.  She is not having any new shortness of breath, PND or orthopnea.  She had some weight loss.  She has been trying to lose weight.    The patient exam reveals COR: Regular rate and rhythm, no murmurs,  Lungs: There to auscultation bilaterally,  Abd: Positive bowel sounds normal frequency pitch, bruits, rebound, guarding, Ext 2+ pulses, no edema.  All available labs, radiology testing, previous records reviewed. Agree with documented assessment and plan.   Unstable angina: The patient  needs cardiac catheterization.   She has close follow-up of her diabetes.  She is on dual therapy for her dyslipidemia.  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  Jeneen Rinks Aviela Blundell  4:08 PM  01/02/2022

## 2022-01-02 NOTE — H&P (View-Only) (Signed)
Cardiology Consultation:   Patient ID: [name] MRN: [id number]; DOB: 11-27-1965  Admit date: 12/31/2021 Date of Consult: 01/02/2022  PCP:  [facility]   [facility] Providers Cardiologist:  Rollene Rotunda, MD     Patient Profile:   [name] is a 56 y.o. female with a hx of CAD status post 4v CABG (LIMA to LAD, SVG to diagonal, SVG to OM, SVG to PDA), hypertension, hyperlipidemia, diabetes, GERD, morbid obesity, hypothyroidism who is being seen 01/02/2022 for the evaluation of chest pain at the request of Dr. David Stall  History of Present Illness:   [name] is a 56 year old female with past medical history noted above.  She is followed by Dr. Antoine Poche as an outpatient.  Underwent four-vessel CABG in 2010 with normal LVEF at that time.  Underwent cardiac catheterization 2014 with all of her bypass grafts patent at that time.  Echocardiogram 05/2017 showed LVEF of 60 to 65%.  Had a stress test 04/2021 was low risk with no perfusion defects.  Was seen in the office 06/2021 with main complaint being knee pain, scheduled to have outpatient knee surgery.  Did report some chest tightness from time to time.  Was noted to have a low functional capacity secondary to her knee pain.  She was set up for repeat nuclear stress test which was done 07/20/2021 and low risk with normal systolic function.  Notes indicate she has been statin intolerant and it was recommended she be seen in the lipid clinic to consider initiation of PCSK9 inhibitor.   She was seen in the lipid clinic 08/2021 and agreeable to resume her rosuvastatin, as well as started on Praluent.   She presented to the ED on 8/12 with complaints of chest pain.  She reports for several days prior to admission she had been having a centralized burning sensation in her chest.  This was prolonged for several hours at a time when present.  No shortness of breath, dizziness, palpitations or nausea or vomiting.  Labs in  the ED showed sodium 141, potassium 3.6, creatinine 0.6, high-sensitivity troponin 127>>178>>188>>207, WBC 11.3, hemoglobin 13.2.  EKG showed sinus rhythm, 72 bpm, borderline prolonged PR interval.  Chest x-ray negative.  She was started on IV heparin and transferred to [facility] for further evaluation.  Echocardiogram 8/13 with LVEF of 60 to 65%, no regional wall motion abnormality, grade 1 diastolic dysfunction, normal RV size.  Cardiology now asked to evaluate.  In talking with patient she reports this feels different from her prior anginal symptoms.  Episodes are not intense and prior anginal symptoms were more of a chest pain/pressure with dyspnea whereas this is a burning sensation.  She was scheduled to have an outpatient EGD for esophageal dilatation 8/15 which she reports is done on a yearly basis.  States that the burning sensation did feel very similar to her usual GERD symptoms.   Also reports she has been following in a weight loss clinic and is down 20 pounds.  Also follows with endocrinology for her diabetes and most recent A1c was 8.2 at visit 12/07/2021.  She is currently on insulin pump.  Past Medical History:  Diagnosis Date   Anxiety    Asthma    Blood transfusion without reported diagnosis    2010 CABG   CAD (coronary artery disease)    4v CABG, 9/10; NL LVF, status post followup Cardiolite November 2012 no ischemia ejection fraction 65%   Cataract    are forming  Diabetes mellitus    Dyslipidemia     LDL 22 mg percent on Crestor   Esophageal stricture    Fatty liver    Gastroparesis due to DM (HCC)    GERD (gastroesophageal reflux disease)    Glaucoma    Hyperlipidemia    Hypertension    Hypothyroidism    Knee injury, right, initial encounter    Left-sided chest wall pain    Myocardial infarction [facility]) 2010   2008 x 2 stents, 2009 x 2 stents. 01-2009 CABG x 4     Past Surgical History:  Procedure Laterality Date   ANGIOPLASTY     with stenting 2008,2009    CARDIAC SURGERY     CESAREAN SECTION     CORONARY ARTERY BYPASS GRAFT  01/26/2009   2008-2 stents; 2009-2 stents   EYE SURGERY     open heart surgery   2010   TUBAL LIGATION       Home Medications:  Prior to Admission medications   Medication Sig Start Date End Date Taking? Authorizing Provider  albuterol (VENTOLIN HFA) 108 (90 Base) MCG/ACT inhaler Inhale 2 puffs into the lungs every 6 (six) hours as needed for wheezing or shortness of breath. 04/21/19  Yes Oswald Hillock, MD  ALPRAZolam Duanne Moron) 0.25 MG tablet Take 0.25 mg by mouth 2 (two) times daily as needed for anxiety or sleep.  11/21/17  Yes [provider]  ammonium lactate (AMLACTIN) 12 % lotion Apply 1 application topically as needed for dry skin. 12/10/20  Yes Felipa Furnace, DPM  atenolol (TENORMIN) 25 MG tablet Take 1 tablet (25 mg total) by mouth daily. 11/30/21  Yes Minus Breeding, MD  Cholecalciferol (VITAMIN D3) 125 MCG (5000 UT) CAPS Take 1 tablet by mouth daily.   Yes [provider]  ciclopirox (PENLAC) 8 % solution APPLY TOPICALLY AT BEDTIME. APPLY OVER NAIL AND SURROUNDING SKIN. APPLY DAILY OVER PREVIOUS COAT. AFTER SEVEN (7) DAYS, MAY REMOVE WITH ALCOHOL AND CONTINUE CYCLE. 12/10/20  Yes Felipa Furnace, DPM  cyclobenzaprine (FLEXERIL) 10 MG tablet Take 10 mg by mouth 3 (three) times daily. 08/16/21  Yes [provider]  Fluticasone-Umeclidin-Vilant (TRELEGY ELLIPTA) 100-62.5-25 MCG/INH AEPB Inhale into the lungs daily.   Yes [provider]  ibuprofen (ADVIL) 800 MG tablet TAKE 1 TABLET BY MOUTH EVERY 8 HOURS AS NEEDED Patient taking differently: Take 400 mg by mouth daily as needed for moderate pain. 06/21/21  Yes Carole Civil, MD  Insulin Human (INSULIN PUMP) SOLN Inject into the skin See admin instructions. Pt uses up to 150 units of NOVOLOG daily VIA PUMP   Yes [provider]  lansoprazole (PREVACID SOLUTAB) 30 MG disintegrating tablet Take 1 tablet (30 mg total) by mouth  daily. 08/22/21  Yes Esterwood, Amy S, PA-C  levothyroxine (SYNTHROID) 137 MCG tablet Take 137 mcg by mouth daily before breakfast.   Yes [provider]  liraglutide (VICTOZA) 18 MG/3ML SOPN Inject 1.8 mg into the skin daily.   Yes [provider]  NOVOLOG 100 UNIT/ML injection Inject 100-150 Units into the skin as directed. Use up to 100-150 units daily in insulin pump as needed 11/23/17  Yes [provider]  oxyCODONE-acetaminophen (PERCOCET/ROXICET) 5-325 MG tablet Take 1 tablet by mouth 3 (three) times daily as needed.   Yes [provider]  prednisoLONE (PRELONE) 15 MG/5ML SOLN Take 15 mg by mouth 2 (two) times daily. 11/02/20  Yes [provider]  Pseudoeph-Doxylamine-DM-APAP (NYQUIL PO) Take 30 mLs by  mouth at bedtime as needed (for cough).   Yes [provider]  rosuvastatin (CRESTOR) 20 MG tablet Take 1 tablet (20 mg total) by mouth daily. 08/23/21 12/31/21 Yes Dontavious Emily, Kim Rinks, MD  triamcinolone ointment (KENALOG) 0.1 % Apply 1 application topically daily as needed. 11/02/20  Yes [provider]  Alirocumab (PRALUENT) 150 MG/ML SOAJ Inject 150 mg into the skin every 14 (fourteen) days. 08/23/21   Minus Breeding, MD  furosemide (LASIX) 40 MG tablet TAKE 1 TABLET BY MOUTH EVERY DAY Patient not taking: Reported on 12/31/2021 11/17/19   Herminio Commons, MD  Magnesium Oxide 500 MG CAPS Take 1 capsule by mouth every morning. Patient not taking: Reported on 12/31/2021 04/07/19   [provider]  Misc. Devices (CANE) MISC 1 standard cane for standing and ambulation.  Diagnosis right knee pain. 05/18/21   Triplett, Tammy, PA-C  nitroGLYCERIN (NITROSTAT) 0.4 MG SL tablet Place 1 tablet (0.4 mg total) under the tongue every 5 (five) minutes x 3 doses as needed for chest pain (if no relief after 2nd dose, proceed to the ED for an evaluation or call 911). Patient not taking: Reported on 12/31/2021 04/23/20   Minus Breeding, MD     Inpatient Medications: Scheduled Meds:  aspirin  81 mg Oral Daily   fluticasone furoate-vilanterol  1 puff Inhalation Daily   And   umeclidinium bromide  1 puff Inhalation Daily   furosemide  40 mg Oral Daily   gabapentin  200 mg Oral QHS   insulin aspart  0-15 Units Subcutaneous TID WC   insulin aspart  0-5 Units Subcutaneous QHS   insulin aspart  6 Units Subcutaneous TID WC   insulin detemir  40 Units Subcutaneous BID   levothyroxine  137 mcg Oral QAC breakfast   metoprolol tartrate  25 mg Oral BID   montelukast  10 mg Oral Daily   pantoprazole  40 mg Oral Daily   rosuvastatin  20 mg Oral Daily   sodium chloride flush  3 mL Intravenous Q12H   Continuous Infusions:  sodium chloride     heparin 1,550 Units/hr (01/02/22 1129)   PRN Meds: sodium chloride, acetaminophen **OR** acetaminophen, albuterol, ALPRAZolam, nitroGLYCERIN, nitroGLYCERIN, ondansetron **OR** ondansetron (ZOFRAN) IV, mouth rinse, oxyCODONE-acetaminophen, sodium chloride flush  Allergies:    Allergies  Allergen Reactions   Metformin Anaphylaxis   Penicillins Anaphylaxis and Other (See Comments)    Has patient had a PCN reaction causing immediate rash, facial/tongue/throat swelling, SOB or lightheadedness with hypotension: Yes Has patient had a PCN reaction causing severe rash involving mucus membranes or skin necrosis: No Has patient had a PCN reaction that required hospitalization No Has patient had a PCN reaction occurring within the last 10 years: No If all of the above answers are "NO", then may proceed with Cephalosporin use.   Metoclopramide Other (See Comments)    Reaction:  Nightmares     Social History:   Social History   Socioeconomic History   Marital status: Single    Spouse name: Not on file   Number of children: 1   Years of education: Not on file   Highest education level: Not on file  Occupational History   Occupation: disabilied  Tobacco Use   Smoking status: Former     Packs/day: 1.00    Years: 3.00    Total pack years: 3.00    Types: Cigarettes    Start date: 03/14/1991    Quit date: 05/22/1993    Years since quitting: 46.6  Smokeless tobacco: Never   Tobacco comments:     Year Quit: 1995  Vaping Use   Vaping Use: Never used  Substance and Sexual Activity   Alcohol use: No    Alcohol/week: 0.0 standard drinks of alcohol   Drug use: No   Sexual activity: Yes    Birth control/protection: Surgical, Post-menopausal    Comment: tubal  Other Topics Concern   Not on file  Social History Narrative   [facility] (03/19/17):   Originally from [place], [place]. Has always lived in [place]. Previously worked at [employer] She is disabled due to her prior cardiac history. 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 No pets currently. No bird exposure. She reports she did have mold in a prior home that she lived in for 3 years. She has since moved to a new home. She enjoys walking.    Social Determinants of Health   Financial Resource Strain: Medium Risk (12/14/2020)   Overall Financial Resource Strain (CARDIA)    Difficulty of Paying Living Expenses: Somewhat hard  Food Insecurity: Food Insecurity Present (12/14/2020)   Hunger Vital Sign    Worried About Running Out of Food in the Last Year: Sometimes true    Ran Out of Food in the Last Year: Sometimes true  Transportation Needs: No Transportation Needs (12/14/2020)   PRAPARE - Hydrologist (Medical): No    Lack of Transportation (Non-Medical): No  Physical Activity: Insufficiently Active (12/14/2020)   Exercise Vital Sign    Days of Exercise per Week: 3 days    Minutes of Exercise per Session: 30 min  Stress: No Stress Concern Present (12/14/2020)   [place]    Feeling of Stress : Only a little  Social Connections: Moderately Integrated (12/14/2020)   Social  Connection and Isolation Panel [NHANES]    Frequency of Communication with Friends and Family: More than three times a week    Frequency of Social Gatherings with Friends and Family: Once a week    Attends Religious Services: More than 4 times per year    Active Member of [employer] or Organizations: Yes    Attends Archivist Meetings: 1 to 4 times per year    Marital Status: Divorced  Human resources officer Violence: Not At Risk (12/14/2020)   Humiliation, Afraid, Rape, and Kick questionnaire    Fear of Current or Ex-Partner: No    Emotionally Abused: No    Physically Abused: No    Sexually Abused: No    Family History:    Family History  Problem Relation Age of Onset   Heart disease Father    Asthma Father    COPD Father    Diabetes Mother    Asthma Paternal Aunt    Asthma Son    Colon cancer Neg Hx    Colon polyps Neg Hx    Esophageal cancer Neg Hx    Rectal cancer Neg Hx    Stomach cancer Neg Hx      ROS:  Please see the history of present illness.   All other ROS reviewed and negative.     Physical Exam/Data:   Vitals:   01/01/22 2050 01/02/22 0050 01/02/22 0610 01/02/22 0927  BP: 127/60 (!) 119/46 137/71 134/74  Pulse: 74 70 72 75  Resp: 18 17 16    Temp: 97.8 F (36.6 C) 98.1 F (36.7 C) 98.1 F (36.7  C)   TempSrc: Oral Oral Oral   SpO2: 98% 98% 96%   Weight:      Height:        Intake/Output Summary (Last 24 hours) at 01/02/2022 1331 Last data filed at 01/02/2022 1300 Gross per 24 hour  Intake 1632.01 ml  Output --  Net 1632.01 ml      12/31/2021    6:32 PM 12/31/2021    1:04 AM 08/23/2021    8:54 AM  Last 3 Weights  Weight (lbs) 234 lb 12.8 oz 240 lb 244 lb 12.8 oz  Weight (kg) 106.505 kg 108.863 kg 111.041 kg     Body mass index is 40.3 kg/m.  General:  Well nourished, well developed, in no acute distress HEENT: normal Neck: no JVD Vascular: No carotid bruits; Distal pulses 2+ bilaterally Cardiac:  normal S1, S2; RRR; no murmur  Lungs: Mild  expiratory wheeze Abd: soft, nontender, no hepatomegaly  Ext: no edema Musculoskeletal:  No deformities, BUE and BLE strength normal and equal Skin: warm and dry  Neuro:  CNs 2-12 intact, no focal abnormalities noted Psych:  Normal affect   EKG:  The EKG was personally reviewed and demonstrates: sinus rhythm, 72 bpm, borderline prolonged PR interval  Telemetry:  Telemetry was personally reviewed and demonstrates:  Sinus Rhythm  Relevant CV Studies:  Echo: 01/01/2022  IMPRESSIONS     1. Left ventricular ejection fraction, by estimation, is 60 to 65%. The  left ventricle has normal function. The left ventricle has no regional  wall motion abnormalities. There is mild concentric left ventricular  hypertrophy. Left ventricular diastolic  parameters are consistent with Grade I diastolic dysfunction (impaired  relaxation).   2. Right ventricular systolic function was not well visualized. The right  ventricular size is normal.   3. The mitral valve is normal in structure. Mild mitral valve  regurgitation. No evidence of mitral stenosis.   4. The aortic valve is tricuspid. Aortic valve regurgitation is not  visualized. Aortic valve sclerosis is present, with no evidence of aortic  valve stenosis.   5. The inferior vena cava is normal in size with greater than 50%  respiratory variability, suggesting right atrial pressure of 3 mmHg.   Comparison(s): Compared to prior echo report in 2019, there is no  signfiicant change.   FINDINGS   Left Ventricle: Left ventricular ejection fraction, by estimation, is 60  to 65%. The left ventricle has normal function. The left ventricle has no  regional wall motion abnormalities. The left ventricular internal cavity  size was normal in size. There is   mild concentric left ventricular hypertrophy. Left ventricular diastolic  parameters are consistent with Grade I diastolic dysfunction (impaired  relaxation).   Right Ventricle: The right  ventricular size is normal. No increase in  right ventricular wall thickness. Right ventricular systolic function was  not well visualized.   Left Atrium: Left atrial size was normal in size.   Right Atrium: Right atrial size was normal in size.   Pericardium: There is no evidence of pericardial effusion.   Mitral Valve: The mitral valve is normal in structure. There is mild  thickening of the mitral valve leaflet(s). Mild mitral valve  regurgitation. No evidence of mitral valve stenosis.   Tricuspid Valve: The tricuspid valve is normal in structure. Tricuspid  valve regurgitation is trivial.   Aortic Valve: The aortic valve is tricuspid. Aortic valve regurgitation is  not visualized. Aortic valve sclerosis is present, with no evidence of  aortic  valve stenosis.   Pulmonic Valve: The pulmonic valve was not well visualized. Pulmonic valve  regurgitation is trivial.   Aorta: The aortic root and ascending aorta are structurally normal, with  no evidence of dilitation.   Venous: The inferior vena cava is normal in size with greater than 50%  respiratory variability, suggesting right atrial pressure of 3 mmHg.   IAS/Shunts: The atrial septum is grossly normal.   Laboratory Data:  High Sensitivity Troponin:   Recent Labs  Lab 12/31/21 1540 12/31/21 2044 01/01/22 0209 01/01/22 0947 01/01/22 1501  TROPONINIHS 207* 186* 179* 116* 137*     Chemistry Recent Labs  Lab 12/31/21 0203 01/01/22 0209  NA 140 137  K 3.6 3.9  CL 108 103  CO2 24 25  GLUCOSE 157* 222*  BUN 7 7  CREATININE 0.67 0.71  CALCIUM 9.6 10.1  MG  --  2.0  GFRNONAA >60 >60  ANIONGAP 8 9    No results for input(s): "PROT", "ALBUMIN", "AST", "ALT", "ALKPHOS", "BILITOT" in the last 168 hours. Lipids No results for input(s): "CHOL", "TRIG", "HDL", "LABVLDL", "LDLCALC", "CHOLHDL" in the last 168 hours.  Hematology Recent Labs  Lab 12/31/21 0203 01/01/22 0209  WBC 11.3* 12.2*  RBC 4.74 5.04  HGB 13.2  14.1  HCT 41.5 42.2  MCV 87.6 83.7  MCH 27.8 28.0  MCHC 31.8 33.4  RDW 15.1 15.0  PLT 329 374   Thyroid No results for input(s): "TSH", "FREET4" in the last 168 hours.  BNPNo results for input(s): "BNP", "PROBNP" in the last 168 hours.  DDimer No results for input(s): "DDIMER" in the last 168 hours.   Radiology/Studies:  ECHOCARDIOGRAM COMPLETE  Result Date: 01/01/2022    ECHOCARDIOGRAM REPORT   Patient Name:   [name] Date of Exam: 01/01/2022 Medical Rec #:  [id number]        Height:       64.0 in Accession #:    [id number]       Weight:       234.8 lb Date of Birth:  [birth year]       BSA:          2.094 m Patient Age:    55 years         BP:           131/72 mmHg Patient Gender: F                HR:           77 bpm. Exam Location:  Inpatient Procedure: 2D Echo, Cardiac Doppler, Color Doppler and Intracardiac            Opacification Agent Indications:    Chest pain  History:        Patient has prior history of Echocardiogram examinations, most                 recent 08/15/2017. CHF, Prior CABG; Signs/Symptoms:Edema.  Sonographer:    Roosvelt Maser RDCS Referring Phys: [phone] [name] IMPRESSIONS  1. Left ventricular ejection fraction, by estimation, is 60 to 65%. The left ventricle has normal function. The left ventricle has no regional wall motion abnormalities. There is mild concentric left ventricular hypertrophy. Left ventricular diastolic parameters are consistent with Grade I diastolic dysfunction (impaired relaxation).  2. Right ventricular systolic function was not well visualized. The right ventricular size is normal.  3. The mitral valve is normal in structure. Mild mitral valve regurgitation. No evidence of mitral stenosis.  4. The aortic valve is tricuspid. Aortic valve regurgitation is not visualized. Aortic valve sclerosis is present, with no evidence of aortic valve stenosis.  5. The inferior vena cava is normal in size with greater than 50% respiratory variability,  suggesting right atrial pressure of 3 mmHg. Comparison(s): Compared to prior echo report in 2019, there is no signfiicant change. FINDINGS  Left Ventricle: Left ventricular ejection fraction, by estimation, is 60 to 65%. The left ventricle has normal function. The left ventricle has no regional wall motion abnormalities. The left ventricular internal cavity size was normal in size. There is  mild concentric left ventricular hypertrophy. Left ventricular diastolic parameters are consistent with Grade I diastolic dysfunction (impaired relaxation). Right Ventricle: The right ventricular size is normal. No increase in right ventricular wall thickness. Right ventricular systolic function was not well visualized. Left Atrium: Left atrial size was normal in size. Right Atrium: Right atrial size was normal in size. Pericardium: There is no evidence of pericardial effusion. Mitral Valve: The mitral valve is normal in structure. There is mild thickening of the mitral valve leaflet(s). Mild mitral valve regurgitation. No evidence of mitral valve stenosis. Tricuspid Valve: The tricuspid valve is normal in structure. Tricuspid valve regurgitation is trivial. Aortic Valve: The aortic valve is tricuspid. Aortic valve regurgitation is not visualized. Aortic valve sclerosis is present, with no evidence of aortic valve stenosis. Pulmonic Valve: The pulmonic valve was not well visualized. Pulmonic valve regurgitation is trivial. Aorta: The aortic root and ascending aorta are structurally normal, with no evidence of dilitation. Venous: The inferior vena cava is normal in size with greater than 50% respiratory variability, suggesting right atrial pressure of 3 mmHg. IAS/Shunts: The atrial septum is grossly normal.  LEFT VENTRICLE PLAX 2D LVIDd:         4.40 cm   Diastology LVIDs:         3.00 cm   LV e' medial:    8.05 cm/s LV PW:         1.20 cm   LV E/e' medial:  9.4 LV IVS:        1.20 cm   LV e' lateral:   12.60 cm/s LVOT diam:      2.10 cm   LV E/e' lateral: 6.0 LV SV:         62 LV SV Index:   29 LVOT Area:     3.46 cm  RIGHT VENTRICLE RV Basal diam:  3.50 cm LEFT ATRIUM           Index        RIGHT ATRIUM           Index LA diam:      3.70 cm 1.77 cm/m   RA Area:     20.70 cm LA Vol (A4C): 51.8 ml 24.73 ml/m  RA Volume:   55.40 ml  26.45 ml/m  AORTIC VALVE LVOT Vmax:   86.50 cm/s LVOT Vmean:  58.300 cm/s LVOT VTI:    0.178 m  AORTA Ao Root diam: 2.90 cm Ao Asc diam:  2.70 cm MITRAL VALVE MV Area (PHT): 3.99 cm    SHUNTS MV Decel Time: 190 msec    Systemic VTI:  0.18 m MV E velocity: 75.80 cm/s  Systemic Diam: 2.10 cm MV A velocity: 82.10 cm/s MV E/A ratio:  0.92 Kim Kaufman MD Electronically signed by Kim Kaufman MD Signature Date/Time: 01/01/2022/5:23:22 PM    Final    DG Chest Portable 1 View  Result Date:  12/31/2021 CLINICAL DATA:  Reflux symptoms for several days, initial encounter EXAM: PORTABLE CHEST 1 VIEW COMPARISON:  04/16/2019 FINDINGS: Cardiac shadow is stable. Postsurgical changes are again seen. The lungs are clear bilaterally. No bony abnormality is noted. IMPRESSION: No active disease. Electronically Signed   By: Inez Catalina M.D.   On: 12/31/2021 01:23     Assessment and Plan:   [name] is a 56 y.o. female with a hx of CAD status post 4v CABG (LIMA to LAD, SVG to diagonal, SVG to OM, SVG to PDA), hypertension, hyperlipidemia, diabetes, GERD, morbid obesity, hypothyroidism who is being seen 01/02/2022 for the evaluation of chest pain at the request of Dr. Aileen Fass  Atypical chest pain Elevated Troponin CAD s/p 4v CABG  -- Last cardiac catheterization 2014 with patent grafts.  Last nuclear stress test 07/2021 which was low risk with no ischemia.  Presented with several days of centralized chest burning to [facility], ED.  High-sensitivity troponins were cycled and noted in the 100-200 range.  EKG without ischemia.  She has been treated with IV heparin.  Echocardiogram was done and showed  LVEF of 60 to 65%, no regional wall motion abnormality, grade 1 diastolic dysfunction.  She reports symptoms are quite similar to what she is experienced with her reflux in the past, and is due to have another EGD with esophageal dilatation tomorrow.  Though unclear why her troponins were elevated. Was able to ambulate in the hallway yesterday without chest pain, but felt fatigued. With recent low risk nuclear stress test, likely needs definitive cath. Will review with MD. -- on ASA, IV heparin, BB, statin (praluent)  HTN: Well-controlled -- Continue metoprolol 25 mg twice daily  HLD: No recent lipid panel -- On Crestor 20 mg daily as well as Praluent  DM: Hemoglobin A1c 7.8 -- Follows with endocrine as an outpatient -- Currently using insulin pump  Per Primary GERD Morbid obesity Hypothyroidism   For questions or updates, please contact [facility] Please consult [email or web address] for contact info under    Signed, Reino Bellis, NP  01/02/2022 1:31 PM  History and all data above reviewed.  Patient examined.  I agree with the findings as above.   The patient has a long history of coronary artery disease as described above.  She presents with chest discomfort that is substernal and burning.  There are no associated symptoms.  There is no radiation.  Is been going on for about 3 days off and on.  It was increased with exertion.  She said there is some elements of it that are consistent with her previous angina.  She is not having any new shortness of breath, PND or orthopnea.  She had some weight loss.  She has been trying to lose weight.    The patient exam reveals COR: Regular rate and rhythm, no murmurs,  Lungs: There to auscultation bilaterally,  Abd: Positive bowel sounds normal frequency pitch, bruits, rebound, guarding, Ext 2+ pulses, no edema.  All available labs, radiology testing, previous records reviewed. Agree with documented assessment and plan.   Unstable angina: The patient  needs cardiac catheterization.   She has close follow-up of her diabetes.  She is on dual therapy for her dyslipidemia.  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  [name]  4:08 PM  01/02/2022

## 2022-01-03 ENCOUNTER — Inpatient Hospital Stay (HOSPITAL_COMMUNITY): Payer: Medicare Other

## 2022-01-03 ENCOUNTER — Encounter (HOSPITAL_COMMUNITY)
Admission: EM | Disposition: A | Discharge status: Home / Self Care | Payer: Self-pay | Source: Home / Self Care | Attending: Internal Medicine

## 2022-01-03 ENCOUNTER — Inpatient Hospital Stay (HOSPITAL_COMMUNITY): Payer: Medicare Other | Admitting: Anesthesiology

## 2022-01-03 DIAGNOSIS — Z794 Long term (current) use of insulin: Secondary | ICD-10-CM | POA: Diagnosis not present

## 2022-01-03 DIAGNOSIS — I10 Essential (primary) hypertension: Secondary | ICD-10-CM | POA: Diagnosis not present

## 2022-01-03 DIAGNOSIS — I63512 Cerebral infarction due to unspecified occlusion or stenosis of left middle cerebral artery: Secondary | ICD-10-CM

## 2022-01-03 DIAGNOSIS — Z87891 Personal history of nicotine dependence: Secondary | ICD-10-CM | POA: Diagnosis not present

## 2022-01-03 DIAGNOSIS — I214 Non-ST elevation (NSTEMI) myocardial infarction: Secondary | ICD-10-CM | POA: Diagnosis not present

## 2022-01-03 DIAGNOSIS — I2510 Atherosclerotic heart disease of native coronary artery without angina pectoris: Secondary | ICD-10-CM | POA: Diagnosis not present

## 2022-01-03 DIAGNOSIS — E119 Type 2 diabetes mellitus without complications: Secondary | ICD-10-CM | POA: Diagnosis not present

## 2022-01-03 HISTORY — PX: IR US GUIDE VASC ACCESS RIGHT: IMG2390

## 2022-01-03 HISTORY — PX: IR CT HEAD LTD: IMG2386

## 2022-01-03 HISTORY — PX: IR PERCUTANEOUS ART THROMBECTOMY/INFUSION INTRACRANIAL INC DIAG ANGIO: IMG6087

## 2022-01-03 HISTORY — PX: LEFT HEART CATH AND CORS/GRAFTS ANGIOGRAPHY: CATH118250

## 2022-01-03 HISTORY — PX: RADIOLOGY WITH ANESTHESIA: SHX6223

## 2022-01-03 LAB — GLUCOSE, CAPILLARY
Glucose-Capillary: 117 mg/dL — ABNORMAL HIGH (ref 70–99)
Glucose-Capillary: 110 mg/dL — ABNORMAL HIGH (ref 70–99)
Glucose-Capillary: 148 mg/dL — ABNORMAL HIGH (ref 70–99)
Glucose-Capillary: 208 mg/dL — ABNORMAL HIGH (ref 70–99)
Glucose-Capillary: 224 mg/dL — ABNORMAL HIGH (ref 70–99)

## 2022-01-03 LAB — APTT: aPTT: 26 seconds (ref 24–36)

## 2022-01-03 LAB — CBC
HCT: 48.5 % — ABNORMAL HIGH (ref 36.0–46.0)
Hemoglobin: 15.5 g/dL — ABNORMAL HIGH (ref 12.0–15.0)
MCH: 27.5 pg (ref 26.0–34.0)
MCHC: 32 g/dL (ref 30.0–36.0)
MCV: 86.1 fL (ref 80.0–100.0)
Platelets: 318 10*3/uL (ref 150–400)
RBC: 5.63 MIL/uL — ABNORMAL HIGH (ref 3.87–5.11)
RDW: 14.6 % (ref 11.5–15.5)
WBC: 11.5 10*3/uL — ABNORMAL HIGH (ref 4.0–10.5)
nRBC: 0 % (ref 0.0–0.2)

## 2022-01-03 LAB — CREATININE, SERUM
Creatinine, Ser: 0.72 mg/dL (ref 0.44–1.00)
GFR, Estimated: >60 mL/min (ref >60)

## 2022-01-03 LAB — HEPARIN LEVEL (UNFRACTIONATED)
Heparin Unfractionated: 0.4 IU/mL (ref 0.30–0.70)
Heparin Unfractionated: <0.1 IU/mL — ABNORMAL LOW (ref 0.30–0.70)

## 2022-01-03 LAB — SARS CORONAVIRUS 2 BY RT PCR: SARS Coronavirus 2 by RT PCR: NEGATIVE

## 2022-01-03 SURGERY — IR WITH ANESTHESIA
Anesthesia: General

## 2022-01-03 SURGERY — LEFT HEART CATH AND CORS/GRAFTS ANGIOGRAPHY
Anesthesia: LOCAL

## 2022-01-03 MED ORDER — HEPARIN (PORCINE) IN NACL 1000-0.9 UT/500ML-% IV SOLN
INTRAVENOUS | Status: AC
  Filled 2022-01-03: qty 1000

## 2022-01-03 MED ORDER — ESMOLOL HCL 100 MG/10ML IV SOLN
INTRAVENOUS | Status: DC | PRN
  Administered 2022-01-03: 20 mg via INTRAVENOUS

## 2022-01-03 MED ORDER — FENTANYL CITRATE (PF) 100 MCG/2ML IJ SOLN
INTRAMUSCULAR | Status: AC | PRN
Start: 2022-01-03 — End: 2022-01-03
  Administered 2022-01-03 (×2): 25 ug via INTRAVENOUS

## 2022-01-03 MED ORDER — MIDAZOLAM HCL 2 MG/2ML IJ SOLN
INTRAMUSCULAR | Status: AC | PRN
  Administered 2022-01-03: 1 mg via INTRAVENOUS
  Administered 2022-01-03: 2 mg via INTRAVENOUS

## 2022-01-03 MED ORDER — SODIUM CHLORIDE 0.9 % IV SOLN: INTRAVENOUS | Status: DC | PRN

## 2022-01-03 MED ORDER — LIDOCAINE HCL (PF) 1 % IJ SOLN
INTRAMUSCULAR | Status: AC | PRN
  Administered 2022-01-03: 5 mL

## 2022-01-03 MED ORDER — FENTANYL CITRATE (PF) 100 MCG/2ML IJ SOLN
INTRAMUSCULAR | Status: AC
  Filled 2022-01-03: qty 2

## 2022-01-03 MED ORDER — SODIUM CHLORIDE 0.9 % IV SOLN: INTRAVENOUS | Status: DC

## 2022-01-03 MED ORDER — PROPOFOL 10 MG/ML IV BOLUS
INTRAVENOUS | Status: DC | PRN
  Administered 2022-01-03: 100 mg via INTRAVENOUS

## 2022-01-03 MED ORDER — IOHEXOL 350 MG/ML SOLN
INTRAVENOUS | Status: AC | PRN
  Administered 2022-01-03: 125 mL

## 2022-01-03 MED ORDER — ENOXAPARIN SODIUM 40 MG/0.4ML IJ SOSY: 40.0000 mg | PREFILLED_SYRINGE | INTRAMUSCULAR | Status: DC

## 2022-01-03 MED ORDER — DEXAMETHASONE SODIUM PHOSPHATE 10 MG/ML IJ SOLN
INTRAMUSCULAR | Status: DC | PRN
  Administered 2022-01-03: 5 mg via INTRAVENOUS

## 2022-01-03 MED ORDER — NITROGLYCERIN 0.4 MG SL SUBL
SUBLINGUAL_TABLET | SUBLINGUAL | Status: AC
  Filled 2022-01-03: qty 1

## 2022-01-03 MED ORDER — SUGAMMADEX SODIUM 200 MG/2ML IV SOLN
INTRAVENOUS | Status: DC | PRN
  Administered 2022-01-03: 100 mg via INTRAVENOUS
  Administered 2022-01-03: 200 mg via INTRAVENOUS
  Administered 2022-01-03: 100 mg via INTRAVENOUS

## 2022-01-03 MED ORDER — IOHEXOL 300 MG/ML  SOLN
100.0000 mL | Freq: Once | INTRAMUSCULAR | Status: AC | PRN
Start: 2022-01-03 — End: 2022-01-03
  Administered 2022-01-03: 50 mL via INTRA_ARTERIAL

## 2022-01-03 MED ORDER — PHENYLEPHRINE HCL-NACL 20-0.9 MG/250ML-% IV SOLN
INTRAVENOUS | Status: DC | PRN
  Administered 2022-01-03: 30 ug/min via INTRAVENOUS

## 2022-01-03 MED ORDER — ONDANSETRON HCL 4 MG/2ML IJ SOLN
INTRAMUSCULAR | Status: DC | PRN
  Administered 2022-01-03: 4 mg via INTRAVENOUS

## 2022-01-03 MED ORDER — LIDOCAINE 2% (20 MG/ML) 5 ML SYRINGE
INTRAMUSCULAR | Status: DC | PRN
  Administered 2022-01-03: 100 mg via INTRAVENOUS

## 2022-01-03 MED ORDER — ACETAMINOPHEN 650 MG RE SUPP: 650.0000 mg | RECTAL | Status: DC | PRN

## 2022-01-03 MED ORDER — ACETAMINOPHEN 160 MG/5ML PO SOLN: 650.0000 mg | ORAL | Status: DC | PRN

## 2022-01-03 MED ORDER — HEPARIN (PORCINE) 25000 UT/250ML-% IV SOLN
1650.0000 [IU]/h | INTRAVENOUS | Status: DC
  Administered 2022-01-03: 1550 [IU]/h via INTRAVENOUS
  Administered 2022-01-04: 1700 [IU]/h via INTRAVENOUS
  Filled 2022-01-03: qty 250

## 2022-01-03 MED ORDER — LIDOCAINE HCL (PF) 1 % IJ SOLN
INTRAMUSCULAR | Status: AC
  Filled 2022-01-03: qty 30

## 2022-01-03 MED ORDER — HEPARIN (PORCINE) IN NACL 1000-0.9 UT/500ML-% IV SOLN
INTRAVENOUS | Status: AC | PRN
  Administered 2022-01-03 (×2): 500 mL

## 2022-01-03 MED ORDER — LIDOCAINE HCL 1 % IJ SOLN
INTRAMUSCULAR | Status: AC
  Filled 2022-01-03: qty 20

## 2022-01-03 MED ORDER — CLEVIDIPINE BUTYRATE 0.5 MG/ML IV EMUL
0.0000 mg/h | INTRAVENOUS | Status: DC
  Administered 2022-01-03: 2 mg/h via INTRAVENOUS

## 2022-01-03 MED ORDER — ROCURONIUM BROMIDE 10 MG/ML (PF) SYRINGE
PREFILLED_SYRINGE | INTRAVENOUS | Status: DC | PRN
  Administered 2022-01-03: 70 mg via INTRAVENOUS
  Administered 2022-01-03: 20 mg via INTRAVENOUS

## 2022-01-03 MED ORDER — SENNOSIDES-DOCUSATE SODIUM 8.6-50 MG PO TABS: 1.0000 | ORAL_TABLET | Freq: Every evening | ORAL | Status: DC | PRN

## 2022-01-03 MED ORDER — PANTOPRAZOLE SODIUM 40 MG IV SOLR
40.0000 mg | INTRAVENOUS | Status: DC
  Administered 2022-01-03: 40 mg via INTRAVENOUS
  Filled 2022-01-03: qty 10

## 2022-01-03 MED ORDER — MIDAZOLAM HCL 2 MG/2ML IJ SOLN
INTRAMUSCULAR | Status: AC
  Filled 2022-01-03: qty 2

## 2022-01-03 MED ORDER — FENTANYL CITRATE (PF) 250 MCG/5ML IJ SOLN
INTRAMUSCULAR | Status: DC | PRN
Start: 2022-01-03 — End: 2022-01-03
  Administered 2022-01-03: 50 ug via INTRAVENOUS

## 2022-01-03 MED ORDER — ACETAMINOPHEN 325 MG PO TABS
650.0000 mg | ORAL_TABLET | ORAL | Status: DC | PRN
  Filled 2022-01-03: qty 2

## 2022-01-03 MED ORDER — STROKE: EARLY STAGES OF RECOVERY BOOK
Freq: Once | Status: AC
  Filled 2022-01-03 (×2): qty 1

## 2022-01-03 MED ORDER — IOHEXOL 350 MG/ML SOLN
100.0000 mL | Freq: Once | INTRAVENOUS | Status: AC | PRN
  Administered 2022-01-03: 50 mL via INTRAVENOUS

## 2022-01-03 MED ORDER — NITROGLYCERIN 0.4 MG SL SUBL
0.4000 mg | SUBLINGUAL_TABLET | SUBLINGUAL | Status: DC | PRN
  Administered 2022-01-03 (×2): 0.4 mg via SUBLINGUAL

## 2022-01-03 MED ORDER — CLEVIDIPINE BUTYRATE 0.5 MG/ML IV EMUL
INTRAVENOUS | Status: AC
  Filled 2022-01-03: qty 50

## 2022-01-03 MED ORDER — NITROGLYCERIN IN D5W 200-5 MCG/ML-% IV SOLN
2.0000 ug/min | INTRAVENOUS | Status: DC
  Administered 2022-01-03: 10 ug/min via INTRAVENOUS
  Filled 2022-01-03: qty 250

## 2022-01-03 MED ORDER — PHENYLEPHRINE 80 MCG/ML (10ML) SYRINGE FOR IV PUSH (FOR BLOOD PRESSURE SUPPORT)
PREFILLED_SYRINGE | INTRAVENOUS | Status: DC | PRN
  Administered 2022-01-03 (×2): 80 ug via INTRAVENOUS

## 2022-01-03 SURGICAL SUPPLY — 12 items
CATH INFINITI 5 FR 3DRC (CATHETERS) ×1 IMPLANT
CATH INFINITI 5 FR IM (CATHETERS) ×1 IMPLANT
CATH INFINITI 5 FR RCB (CATHETERS) ×1 IMPLANT
CATH INFINITI 5FR MULTPACK ANG (CATHETERS) ×1 IMPLANT
KIT HEART LEFT (KITS) ×2 IMPLANT
PACK CARDIAC CATHETERIZATION (CUSTOM PROCEDURE TRAY) ×2 IMPLANT
SHEATH PINNACLE 5F 10CM (SHEATH) ×1 IMPLANT
SYR MEDRAD MARK 7 150ML (SYRINGE) ×2 IMPLANT
TRANSDUCER W/STOPCOCK (MISCELLANEOUS) ×2 IMPLANT
TUBING CIL FLEX 10 FLL-RA (TUBING) ×2 IMPLANT
WIRE EMERALD 3MM-J .035X150CM (WIRE) ×1 IMPLANT
WIRE EMERALD 3MM-J .035X260CM (WIRE) ×1 IMPLANT

## 2022-01-03 NOTE — Procedures (Signed)
INTERVENTIONAL NEURORADIOLOGY BRIEF POSTPROCEDURE NOTE  DIAGNOSTIC CEREBRAL ANGIOGRAM AND MECHANICAL THROMBECTOMY  Attending: Dr. Baldemar Lenis  Diagnosis: Left M2/MCA occlusion  Access site: RCFA  Access closure: Perclose Prostyle  Anesthesia: GETA  Medication used: refer to anesthesia documentation.  Complications: None.  Estimated blood loss: 30 mL.  Specimen: None.  Findings: Mid left M2/MCA occlusion. Mechanical thrombectomy performed with combine stent retriever and direct contact aspiration with complete recanalization (TICI3). No hemorrhagic or thromboembolic complication.  The patient tolerated the procedure well without incident or complication and is in stable condition.   PLAN: - bed rest x 6 hours - SBP 120-140 mmHg

## 2022-01-03 NOTE — Progress Notes (Signed)
[facility] PROGRESS NOTE    Progress Note  [name]  IRS:[id number] DOB: 1965-09-18 DOA: 12/31/2021 PCP: [facility]     Brief Narrative:   [name] is an 56 y.o. female past medical history significant for a CABG in 2010 history of stent placement obesity diabetes mellitus type 2 comes into the ED for worsening burning sensation of her chest started 3 days prior to admission twelve-lead EKG showed no acute abnormalities cardiac biomarkers were mildly elevated and she had a more leukocytosis it was discussed with cardiology who requested transfer to [facility].   Assessment/Plan:   Atypical chest pain with elevated troponins: Started on IV heparin, aspirin, change her atenolol to metoprolol. Cardio biomarkers are trending down. 2D echo showed an EF of 60% with grade 1 diastolic heart failure Continue nitro sublingual as needed for chest pain. Cardiology was consulted recommended a left heart cath scheduled for today  Diabetes mellitus type 2, insulin dependent (HCC) Continue Neurontin, A1c 7.8. Hyperglycemic on admission. Started on long-acting insulin plus sliding scale her blood glucose well controlled continue current regimen.   GERD: EGD planned as an outpatient to stay for dilation. Continue PPI.  Hypothyroidism: Continue Synthroid.  Dyslipidemia: Continue Crestor.  Morbid obesity: With a BMI greater than 20 counseled.   DVT prophylaxis: Heparin Family Communication:none Status is: Inpatient Remains inpatient appropriate because: Atypical chest pain and elevated troponins.    Code Status:     Code Status Orders  (From admission, onward)           Start     Ordered   12/31/21 0807  Full code  Continuous        12/31/21 0808           Code Status History     Date Active Date Inactive Code Status Order ID Comments User Context   04/17/2019 0303 04/21/2019 1718 Full Code [id number]  Onnie Boer,  MD ED   04/13/2016 0931 04/16/2016 1711 Full Code [id number]  Russella Dar, NP Inpatient   08/26/2013 0959 08/27/2013 0430 Full Code [id number]  Andreas Newport, MD HOV         IV Access:   Peripheral IV   Procedures and diagnostic studies:   ECHOCARDIOGRAM COMPLETE  Result Date: 01/01/2022    ECHOCARDIOGRAM REPORT   Patient Name:   [name] Date of Exam: 01/01/2022 Medical Rec #:  [id number]        Height:       64.0 in Accession #:    [id number]       Weight:       234.8 lb Date of Birth:  January 19, 1966       BSA:          2.094 m Patient Age:    55 years         BP:           131/72 mmHg Patient Gender: F                HR:           77 bpm. Exam Location:  Inpatient Procedure: 2D Echo, Cardiac Doppler, Color Doppler and Intracardiac            Opacification Agent Indications:    Chest pain  History:        Patient has prior history of Echocardiogram examinations, most  recent 08/15/2017. CHF, Prior CABG; Signs/Symptoms:Edema.  Sonographer:    Merrie Roof RDCS Referring Phys: [place]  1. Left ventricular ejection fraction, by estimation, is 60 to 65%. The left ventricle has normal function. The left ventricle has no regional wall motion abnormalities. There is mild concentric left ventricular hypertrophy. Left ventricular diastolic parameters are consistent with Grade I diastolic dysfunction (impaired relaxation).  2. Right ventricular systolic function was not well visualized. The right ventricular size is normal.  3. The mitral valve is normal in structure. Mild mitral valve regurgitation. No evidence of mitral stenosis.  4. The aortic valve is tricuspid. Aortic valve regurgitation is not visualized. Aortic valve sclerosis is present, with no evidence of aortic valve stenosis.  5. The inferior vena cava is normal in size with greater than 50% respiratory variability, suggesting right atrial pressure of 3 mmHg. Comparison(s): Compared to prior echo  report in 2019, there is no signfiicant change. FINDINGS  Left Ventricle: Left ventricular ejection fraction, by estimation, is 60 to 65%. The left ventricle has normal function. The left ventricle has no regional wall motion abnormalities. The left ventricular internal cavity size was normal in size. There is  mild concentric left ventricular hypertrophy. Left ventricular diastolic parameters are consistent with Grade I diastolic dysfunction (impaired relaxation). Right Ventricle: The right ventricular size is normal. No increase in right ventricular wall thickness. Right ventricular systolic function was not well visualized. Left Atrium: Left atrial size was normal in size. Right Atrium: Right atrial size was normal in size. Pericardium: There is no evidence of pericardial effusion. Mitral Valve: The mitral valve is normal in structure. There is mild thickening of the mitral valve leaflet(s). Mild mitral valve regurgitation. No evidence of mitral valve stenosis. Tricuspid Valve: The tricuspid valve is normal in structure. Tricuspid valve regurgitation is trivial. Aortic Valve: The aortic valve is tricuspid. Aortic valve regurgitation is not visualized. Aortic valve sclerosis is present, with no evidence of aortic valve stenosis. Pulmonic Valve: The pulmonic valve was not well visualized. Pulmonic valve regurgitation is trivial. Aorta: The aortic root and ascending aorta are structurally normal, with no evidence of dilitation. Venous: The inferior vena cava is normal in size with greater than 50% respiratory variability, suggesting right atrial pressure of 3 mmHg. IAS/Shunts: The atrial septum is grossly normal.  LEFT VENTRICLE PLAX 2D LVIDd:         4.40 cm   Diastology LVIDs:         3.00 cm   LV e' medial:    8.05 cm/s LV PW:         1.20 cm   LV E/e' medial:  9.4 LV IVS:        1.20 cm   LV e' lateral:   12.60 cm/s LVOT diam:     2.10 cm   LV E/e' lateral: 6.0 LV SV:         62 LV SV Index:   29 LVOT Area:      3.46 cm  RIGHT VENTRICLE RV Basal diam:  3.50 cm LEFT ATRIUM           Index        RIGHT ATRIUM           Index LA diam:      3.70 cm 1.77 cm/m   RA Area:     20.70 cm LA Vol (A4C): 51.8 ml 24.73 ml/m  RA Volume:   55.40 ml  26.45 ml/m  AORTIC VALVE  LVOT Vmax:   86.50 cm/s LVOT Vmean:  58.300 cm/s LVOT VTI:    0.178 m  AORTA Ao Root diam: 2.90 cm Ao Asc diam:  2.70 cm MITRAL VALVE MV Area (PHT): 3.99 cm    SHUNTS MV Decel Time: 190 msec    Systemic VTI:  0.18 m MV E velocity: 75.80 cm/s  Systemic Diam: 2.10 cm MV A velocity: 82.10 cm/s MV E/A ratio:  0.92 Laurance Flatten MD Electronically signed by Laurance Flatten MD Signature Date/Time: 01/01/2022/5:23:22 PM    Final      Medical Consultants:   None.   Subjective:    [name] denies chest pain shortness of breath.  Objective:    Vitals:   01/02/22 1707 01/02/22 2101 01/03/22 0026 01/03/22 0526  BP: 137/69 113/62 122/60 125/63  Pulse: 65 69 69 69  Resp: 16 16 16    Temp: 97.6 F (36.4 C)  (!) 97.4 F (36.3 C) 97.8 F (36.6 C)  TempSrc: Oral  Oral Oral  SpO2: 99% 100%  95%  Weight:      Height:       SpO2: 95 %   Intake/Output Summary (Last 24 hours) at 01/03/2022 0758 Last data filed at 01/03/2022 01/05/2022 Gross per 24 hour  Intake 1713.89 ml  Output --  Net 1713.89 ml    Filed Weights   12/31/21 0104 12/31/21 1832  Weight: 108.9 kg 106.5 kg    Exam: General exam: In no acute distress. Respiratory system: Good air movement and clear to auscultation. Cardiovascular system: S1 & S2 heard, RRR. No JVD. Gastrointestinal system: Abdomen is nondistended, soft and nontender.  Extremities: No pedal edema. Skin: No rashes, lesions or ulcers Psychiatry: Judgement and insight appear normal. Mood & affect appropriate. Data Reviewed:    Labs: Basic Metabolic Panel: Recent Labs  Lab 12/31/21 0203 01/01/22 0209  NA 140 137  K 3.6 3.9  CL 108 103  CO2 24 25  GLUCOSE 157* 222*  BUN 7 7  CREATININE 0.67  0.71  CALCIUM 9.6 10.1  MG  --  2.0    GFR Estimated Creatinine Clearance: 95.7 mL/min (by C-G formula based on SCr of 0.71 mg/dL). Liver Function Tests: No results for input(s): "AST", "ALT", "ALKPHOS", "BILITOT", "PROT", "ALBUMIN" in the last 168 hours. No results for input(s): "LIPASE", "AMYLASE" in the last 168 hours. No results for input(s): "AMMONIA" in the last 168 hours. Coagulation profile No results for input(s): "INR", "PROTIME" in the last 168 hours. COVID-19 Labs  No results for input(s): "DDIMER", "FERRITIN", "LDH", "CRP" in the last 72 hours.  Lab Results  Component Value Date   SARSCOV2NAA POSITIVE (A) 04/16/2019    CBC: Recent Labs  Lab 12/31/21 0203 01/01/22 0209  WBC 11.3* 12.2*  HGB 13.2 14.1  HCT 41.5 42.2  MCV 87.6 83.7  PLT 329 374    Cardiac Enzymes: No results for input(s): "CKTOTAL", "CKMB", "CKMBINDEX", "TROPONINI" in the last 168 hours. BNP (last 3 results) No results for input(s): "PROBNP" in the last 8760 hours. CBG: Recent Labs  Lab 01/02/22 1128 01/02/22 1252 01/02/22 1704 01/02/22 2103 01/03/22 0755  GLUCAP 320* 389* 252* 145* 117*    D-Dimer: No results for input(s): "DDIMER" in the last 72 hours. Hgb A1c: No results for input(s): "HGBA1C" in the last 72 hours.  Lipid Profile: No results for input(s): "CHOL", "HDL", "LDLCALC", "TRIG", "CHOLHDL", "LDLDIRECT" in the last 72 hours. Thyroid function studies: No results for input(s): "TSH", "T4TOTAL", "T3FREE", "THYROIDAB" in the last  72 hours.  Invalid input(s): "FREET3" Anemia work up: No results for input(s): "VITAMINB12", "FOLATE", "FERRITIN", "TIBC", "IRON", "RETICCTPCT" in the last 72 hours. Sepsis Labs: Recent Labs  Lab 12/31/21 0203 01/01/22 0209  WBC 11.3* 12.2*    Microbiology No results found for this or any previous visit (from the past 240 hour(s)).   Medications:    aspirin  81 mg Oral Daily   fluticasone furoate-vilanterol  1 puff Inhalation Daily    And   umeclidinium bromide  1 puff Inhalation Daily   furosemide  40 mg Oral Daily   gabapentin  200 mg Oral QHS   insulin aspart  0-15 Units Subcutaneous TID WC   insulin aspart  0-5 Units Subcutaneous QHS   insulin aspart  6 Units Subcutaneous TID WC   insulin detemir  40 Units Subcutaneous BID   levothyroxine  137 mcg Oral QAC breakfast   metoprolol tartrate  25 mg Oral BID   montelukast  10 mg Oral Daily   pantoprazole  40 mg Oral Daily   rosuvastatin  20 mg Oral Daily   sodium chloride flush  3 mL Intravenous Q12H   sodium chloride flush  3 mL Intravenous Q12H   Continuous Infusions:  sodium chloride     sodium chloride     sodium chloride 1 mL/kg/hr (01/03/22 [id number])   heparin 1,550 Units/hr (01/03/22 [id number])      LOS: 3 days   Charlynne Cousins  [facility]  01/03/2022, 7:58 AM

## 2022-01-03 NOTE — Sedation Documentation (Addendum)
 of fentanyl that was pulled by this RN was given to CRNA for intubation. Please see CRNA charting

## 2022-01-03 NOTE — Sedation Documentation (Signed)
Lab called stating heparin/aptt level was clotted and needs a recollect. Provider notified and is in intervention. No new orders received.

## 2022-01-03 NOTE — Interval H&P Note (Signed)
Cath Lab Visit (complete for each Cath Lab visit)  Clinical Evaluation Leading to the Procedure:   ACS: No.  Non-ACS:    Anginal Classification: CCS III  Anti-ischemic medical therapy: Minimal Therapy (1 class of medications)  Non-Invasive Test Results: No non-invasive testing performed  Prior CABG: Previous CABG      History and Physical Interval Note:  01/03/2022 10:54 AM  [name]  has presented today for surgery, with the diagnosis of chest pain.  The various methods of treatment have been discussed with the patient and family. After consideration of risks, benefits and other options for treatment, the patient has consented to  Procedure(s): LEFT HEART CATH AND CORS/GRAFTS ANGIOGRAPHY (N/A) as a surgical intervention.  The patient's history has been reviewed, patient examined, no change in status, stable for surgery.  I have reviewed the patient's chart and labs.  Questions were answered to the patient's satisfaction.     Nicki Guadalajara

## 2022-01-03 NOTE — Progress Notes (Addendum)
Progress Note  Patient Name: [name] Date of Encounter: 01/03/2022  [facility] Cardiologist: Rollene Rotunda, MD   Subjective   Patient is feeling well this AM. Denies chest pain, palpitations, dizziness, ankle edema. Is ready for her heart catheterization today   Inpatient Medications    Scheduled Meds:  aspirin  81 mg Oral Daily   fluticasone furoate-vilanterol  1 puff Inhalation Daily   And   umeclidinium bromide  1 puff Inhalation Daily   furosemide  40 mg Oral Daily   gabapentin  200 mg Oral QHS   insulin aspart  0-15 Units Subcutaneous TID WC   insulin aspart  0-5 Units Subcutaneous QHS   insulin aspart  6 Units Subcutaneous TID WC   insulin detemir  40 Units Subcutaneous BID   levothyroxine  137 mcg Oral QAC breakfast   metoprolol tartrate  25 mg Oral BID   montelukast  10 mg Oral Daily   pantoprazole  40 mg Oral Daily   rosuvastatin  20 mg Oral Daily   sodium chloride flush  3 mL Intravenous Q12H   sodium chloride flush  3 mL Intravenous Q12H   Continuous Infusions:  sodium chloride     sodium chloride     sodium chloride 1 mL/kg/hr (01/03/22 2951)   heparin 1,550 Units/hr (01/03/22 0632)   PRN Meds: sodium chloride, sodium chloride, acetaminophen **OR** acetaminophen, albuterol, ALPRAZolam, nitroGLYCERIN, nitroGLYCERIN, ondansetron **OR** ondansetron (ZOFRAN) IV, mouth rinse, oxyCODONE-acetaminophen, sodium chloride flush, sodium chloride flush   Vital Signs    Vitals:   01/03/22 0026 01/03/22 0526 01/03/22 0756 01/03/22 0825  BP: 122/60 125/63 (!) 110/57   Pulse: 69 69 67   Resp: 16  15   Temp: (!) 97.4 F (36.3 C) 97.8 F (36.6 C) 98.3 F (36.8 C)   TempSrc: Oral Oral Axillary   SpO2:  95% 97% 97%  Weight:      Height:        Intake/Output Summary (Last 24 hours) at 01/03/2022 0850 Last data filed at 01/03/2022 8841 Gross per 24 hour  Intake 1713.89 ml  Output --  Net 1713.89 ml      12/31/2021    6:32 PM 12/31/2021    1:04 AM  08/23/2021    8:54 AM  Last 3 Weights  Weight (lbs) 234 lb 12.8 oz 240 lb 244 lb 12.8 oz  Weight (kg) 106.505 kg 108.863 kg 111.041 kg      Telemetry    Sinus rhythm - Personally Reviewed  ECG    No new tracings since 8/12 - Personally Reviewed  Physical Exam   GEN: No acute distress.  Sitting comfortably on the side of the bed  Neck: No JVD Cardiac: RRR, no murmurs, rubs, or gallops. Radial pulses 2+ bilaterally  Respiratory: Clear to auscultation bilaterally. Normal WOB on room air  GI: Soft, nontender, non-distended  MS: No edema; No deformity. Neuro:  Nonfocal  Psych: Normal affect   Labs    High Sensitivity Troponin:   Recent Labs  Lab 12/31/21 1540 12/31/21 2044 01/01/22 0209 01/01/22 0947 01/01/22 1501  TROPONINIHS 207* 186* 179* 116* 137*     Chemistry Recent Labs  Lab 12/31/21 0203 01/01/22 0209  NA 140 137  K 3.6 3.9  CL 108 103  CO2 24 25  GLUCOSE 157* 222*  BUN 7 7  CREATININE 0.67 0.71  CALCIUM 9.6 10.1  MG  --  2.0  GFRNONAA >60 >60  ANIONGAP 8 9    Lipids  No results for input(s): "CHOL", "TRIG", "HDL", "LABVLDL", "LDLCALC", "CHOLHDL" in the last 168 hours.  Hematology Recent Labs  Lab 12/31/21 0203 01/01/22 0209  WBC 11.3* 12.2*  RBC 4.74 5.04  HGB 13.2 14.1  HCT 41.5 42.2  MCV 87.6 83.7  MCH 27.8 28.0  MCHC 31.8 33.4  RDW 15.1 15.0  PLT 329 374   Thyroid No results for input(s): "TSH", "FREET4" in the last 168 hours.  BNPNo results for input(s): "BNP", "PROBNP" in the last 168 hours.  DDimer No results for input(s): "DDIMER" in the last 168 hours.   Radiology    ECHOCARDIOGRAM COMPLETE  Result Date: 01/01/2022    ECHOCARDIOGRAM REPORT   Patient Name:   [name] Date of Exam: 01/01/2022 Medical Rec #:  [id number]        Height:       64.0 in Accession #:    [id number]       Weight:       234.8 lb Date of Birth:  July 08, 1965       BSA:          2.094 m Patient Age:    56 years         BP:           131/72 mmHg Patient  Gender: F                HR:           77 bpm. Exam Location:  Inpatient Procedure: 2D Echo, Cardiac Doppler, Color Doppler and Intracardiac            Opacification Agent Indications:    Chest pain  History:        Patient has prior history of Echocardiogram examinations, most                 recent 08/15/2017. CHF, Prior CABG; Signs/Symptoms:Edema.  Sonographer:    Merrie Roof RDCS Referring Phys: [place]  1. Left ventricular ejection fraction, by estimation, is 60 to 65%. The left ventricle has normal function. The left ventricle has no regional wall motion abnormalities. There is mild concentric left ventricular hypertrophy. Left ventricular diastolic parameters are consistent with Grade I diastolic dysfunction (impaired relaxation).  2. Right ventricular systolic function was not well visualized. The right ventricular size is normal.  3. The mitral valve is normal in structure. Mild mitral valve regurgitation. No evidence of mitral stenosis.  4. The aortic valve is tricuspid. Aortic valve regurgitation is not visualized. Aortic valve sclerosis is present, with no evidence of aortic valve stenosis.  5. The inferior vena cava is normal in size with greater than 50% respiratory variability, suggesting right atrial pressure of 3 mmHg. Comparison(s): Compared to prior echo report in 2019, there is no signfiicant change. FINDINGS  Left Ventricle: Left ventricular ejection fraction, by estimation, is 60 to 65%. The left ventricle has normal function. The left ventricle has no regional wall motion abnormalities. The left ventricular internal cavity size was normal in size. There is  mild concentric left ventricular hypertrophy. Left ventricular diastolic parameters are consistent with Grade I diastolic dysfunction (impaired relaxation). Right Ventricle: The right ventricular size is normal. No increase in right ventricular wall thickness. Right ventricular systolic function was not well  visualized. Left Atrium: Left atrial size was normal in size. Right Atrium: Right atrial size was normal in size. Pericardium: There is no evidence of pericardial effusion. Mitral Valve: The mitral valve is normal in  structure. There is mild thickening of the mitral valve leaflet(s). Mild mitral valve regurgitation. No evidence of mitral valve stenosis. Tricuspid Valve: The tricuspid valve is normal in structure. Tricuspid valve regurgitation is trivial. Aortic Valve: The aortic valve is tricuspid. Aortic valve regurgitation is not visualized. Aortic valve sclerosis is present, with no evidence of aortic valve stenosis. Pulmonic Valve: The pulmonic valve was not well visualized. Pulmonic valve regurgitation is trivial. Aorta: The aortic root and ascending aorta are structurally normal, with no evidence of dilitation. Venous: The inferior vena cava is normal in size with greater than 50% respiratory variability, suggesting right atrial pressure of 3 mmHg. IAS/Shunts: The atrial septum is grossly normal.  LEFT VENTRICLE PLAX 2D LVIDd:         4.40 cm   Diastology LVIDs:         3.00 cm   LV e' medial:    8.05 cm/s LV PW:         1.20 cm   LV E/e' medial:  9.4 LV IVS:        1.20 cm   LV e' lateral:   12.60 cm/s LVOT diam:     2.10 cm   LV E/e' lateral: 6.0 LV SV:         62 LV SV Index:   29 LVOT Area:     3.46 cm  RIGHT VENTRICLE RV Basal diam:  3.50 cm LEFT ATRIUM           Index        RIGHT ATRIUM           Index LA diam:      3.70 cm 1.77 cm/m   RA Area:     20.70 cm LA Vol (A4C): 51.8 ml 24.73 ml/m  RA Volume:   55.40 ml  26.45 ml/m  AORTIC VALVE LVOT Vmax:   86.50 cm/s LVOT Vmean:  58.300 cm/s LVOT VTI:    0.178 m  AORTA Ao Root diam: 2.90 cm Ao Asc diam:  2.70 cm MITRAL VALVE MV Area (PHT): 3.99 cm    SHUNTS MV Decel Time: 190 msec    Systemic VTI:  0.18 m MV E velocity: 75.80 cm/s  Systemic Diam: 2.10 cm MV A velocity: 82.10 cm/s MV E/A ratio:  0.92 Laurance Flatten MD Electronically signed by Laurance Flatten MD Signature Date/Time: 01/01/2022/5:23:22 PM    Final     Cardiac Studies   Echocardiogram 01/01/2022  1. Left ventricular ejection fraction, by estimation, is 60 to 65%. The  left ventricle has normal function. The left ventricle has no regional  wall motion abnormalities. There is mild concentric left ventricular  hypertrophy. Left ventricular diastolic  parameters are consistent with Grade I diastolic dysfunction (impaired  relaxation).   2. Right ventricular systolic function was not well visualized. The right  ventricular size is normal.   3. The mitral valve is normal in structure. Mild mitral valve  regurgitation. No evidence of mitral stenosis.   4. The aortic valve is tricuspid. Aortic valve regurgitation is not  visualized. Aortic valve sclerosis is present, with no evidence of aortic  valve stenosis.   5. The inferior vena cava is normal in size with greater than 50%  respiratory variability, suggesting right atrial pressure of 3 mmHg.   Comparison(s): Compared to prior echo report in 2019, there is no  signfiicant change.   Patient Profile     56 y.o. female with a past medical history of CAD status post  4v CABG (LIMA to LAD, SVG to diagonal, SVG to OM, SVG to PDA), hypertension, hyperlipidemia, diabetes, GERD, morbid obesity, hypothyroidism who is being seen for the evaluation of chest pain   Assessment & Plan    Atypical chest pain Elevated Troponin CAD s/p 4v CABG  - Last cardiac catheterization 2014 with patent grafts.  Last nuclear stress test 07/2021 which was low risk with no ischemia.   - Patient presented to [facility] ED complaining of several days of centralized chest burning  - High-sensitivity troponins were cycled and noted in the 100-200 range.  EKG without ischemia.   - Echocardiogram this admission showed LVEF of 60 to 65%, no regional wall motion abnormality, grade 1 diastolic dysfunction.   - She reports symptoms are quite similar to what  she is experienced with her gastric reflux in the past, and is due to have another EGD with esophageal dilatation tomorrow. However, this does not explain why her troponins were elevated. She recently had a stress test in 07/2021 that was low risk, so will need a heart catheterization as a more definitive evaluation of chest pain with elevated trops  - Scheduled to undergo cath today -- patient has been NPO  - I ordered an CMP to be drawn tomorrow AM to check renal function after cath, liver function given early stage cirrhosis on dual lipid therapy  - on ASA, IV heparin, BB (metoprolol), statin, praluent   HTN: Well-controlled, BP 110/57 this AM  - Continue metoprolol 25 mg twice daily   HLD: No recent lipid panel - On Crestor 20 mg daily as well as Praluent - Ordered lipid panel to be drawn in AM  - Of note, she does have some early stage cirrhosis, she is on a low dose of praluent and should be able to tolerate. Continue to follow liver function as an outpatient    DM: Hemoglobin A1c 7.8 - Follows with endocrine as an outpatient - Currently using insulin pump   Per Primary - GERD - Morbid obesity - Hypothyroidism     For questions or updates, please contact [facility] Please consult [email or web address] for contact info under     Signed, Margie Billet, PA-C  01/03/2022, 8:50 AM    History and all data above reviewed.  Unable to examine with the patient in the cath lab.  I reviewed the films with Dr. Claiborne Billings and she has circ and RCA lesions that could be culprit lesions not adequately perfused through the grafts.  He will review with some other interventional partners with consideration of multivessel PCI deferred likely until tomorrow because of the contrast given today.

## 2022-01-03 NOTE — Progress Notes (Signed)
ANTICOAGULATION CONSULT NOTE - Follow Up  Pharmacy Consult for Heparin  Indication: chest pain/ACS  Allergies  Allergen Reactions   Metformin Anaphylaxis   Penicillins Anaphylaxis and Other (See Comments)    Has patient had a PCN reaction causing immediate rash, facial/tongue/throat swelling, SOB or lightheadedness with hypotension: Yes Has patient had a PCN reaction causing severe rash involving mucus membranes or skin necrosis: No Has patient had a PCN reaction that required hospitalization No Has patient had a PCN reaction occurring within the last 10 years: No If all of the above answers are "NO", then may proceed with Cephalosporin use.   Metoclopramide Other (See Comments)    Reaction:  Nightmares     Patient Measurements: Height: 5\' 4"  (162.6 cm) Weight: 106.5 kg (234 lb 12.8 oz) IBW/kg (Calculated) : 54.7  Vital Signs: Temp: 98.3 F (36.8 C) (08/15 0756) Temp Source: Axillary (08/15 0756) BP: 110/57 (08/15 0800) Pulse Rate: 71 (08/15 0800)  Labs: Recent Labs    01/01/22 0209 01/01/22 0947 01/01/22 1501 01/02/22 0658 01/03/22 0318  HGB 14.1  --   --   --   --   HCT 42.2  --   --   --   --   PLT 374  --   --   --   --   HEPARINUNFRC 0.35 0.34  --  0.25* 0.40  CREATININE 0.71  --   --   --   --   TROPONINIHS 179* 116* 137*  --   --      Estimated Creatinine Clearance: 95.7 mL/min (by C-G formula based on SCr of 0.71 mg/dL).   Medical History: Past Medical History:  Diagnosis Date   Anxiety    Asthma    Blood transfusion without reported diagnosis    2010 CABG   CAD (coronary artery disease)    4v CABG, 9/10; NL LVF, status post followup Cardiolite November 2012 no ischemia ejection fraction 65%   Cataract    are forming   Diabetes mellitus    Dyslipidemia     LDL 22 mg percent on Crestor   Esophageal stricture    Fatty liver    Gastroparesis due to DM (HCC)    GERD (gastroesophageal reflux disease)    Glaucoma    Hyperlipidemia     Hypertension    Hypothyroidism    Knee injury, right, initial encounter    Left-sided chest wall pain    Myocardial infarction (HCC) 2010   2008 x 2 stents, 2009 x 2 stents. 01-2009 CABG x 4      Assessment: 56 y/o F with cardiac history in the ED with "burning" in chest, mildly elevated trop. Pharmacy to dose heparin for ACS. Plans noted for cath today  -Heparin level 0.4 (therapeutic)  Goal of Therapy:  Heparin level 0.3-0.7 units/ml Monitor platelets by anticoagulation protocol: Yes   Plan:  -Continue heparin 1550 units/hr -heparin level and CBC in am  58, PharmD Clinical Pharmacist **Pharmacist phone directory can now be found on [email or web address] (PW [email or web address]).  Listed under [facility].

## 2022-01-03 NOTE — Progress Notes (Signed)
Progress Note: The patient was recently transferred from PACU to 4 N24.  As noted previously, following the cardiac catheterization, the completion of hemostasis from right femoral groin access was noted that the patient had some babbled speech and had some aphasia to certain words.  She had intact motor sensation as well as sensation to touch.  Her symptoms seem to improve but due to concern for possible embolic event resulting from her procedure, a code stroke was activated.  Patient was evaluated by Dr. Derry Lory of neurology.  CT of the head was negative for large hypodensity abnormality.  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  Patient underwent successful mechanical thrombectomy with stent retriever and direct contact aspiration with complete recanalization.  There were no hemorrhagic or thromboembolic complications. While in the PACU, the patient mains stable neurologically.  However she began to notice some digestion like symptomatology which led to some chest discomfort.  Her symptoms completely resolved with 2 sublingual nitroglycerin.  She has been since transferred to 4 N. She is now completely pain-free and blood pressure 122/68.  Will initiate low-dose IV nitroglycerin drip with her documented RCA thrombus as well as high-grade circumflex stenoses.  Hence the groin access was used for her neurologic procedure with sheath removal at 1414, will plan to initiate heparin therapy 8 hours post second femoral stick from her neurologic procedure.  The patient ultimately will need intervention to her circumflex and probable RCA but will not schedule for tomorrow and defer until it is felt that she is stable to undergo this procedure. Nicki Guadalaj[place], MD  01/03/2022  5:27 PM

## 2022-01-03 NOTE — Care Management Important Message (Signed)
Important Message  Patient Details  Name: [name] MRN: [id number] Date of Birth: Apr 18, 1966   Medicare Important Message Given:  Yes     Jacob, [place] 01/03/2022, 7:52 AM

## 2022-01-03 NOTE — Progress Notes (Signed)
ANTICOAGULATION CONSULT NOTE - Follow Up  Pharmacy Consult for Heparin  Indication: chest pain/ACS  Allergies  Allergen Reactions   Metformin Anaphylaxis   Penicillins Anaphylaxis and Other (See Comments)    Has patient had a PCN reaction causing immediate rash, facial/tongue/throat swelling, SOB or lightheadedness with hypotension: Yes Has patient had a PCN reaction causing severe rash involving mucus membranes or skin necrosis: No Has patient had a PCN reaction that required hospitalization No Has patient had a PCN reaction occurring within the last 10 years: No If all of the above answers are "NO", then may proceed with Cephalosporin use.   Metoclopramide Other (See Comments)    Reaction:  Nightmares     Patient Measurements: Height: 5\' 4"  (162.6 cm) Weight: 104.2 kg (229 lb 11.5 oz) IBW/kg (Calculated) : 54.7  Vital Signs: Temp: 97 F (36.1 C) (08/15 1445) Temp Source: Axillary (08/15 0756) BP: 128/61 (08/15 1654) Pulse Rate: 83 (08/15 1654)  Labs: Recent Labs    01/01/22 0209 01/01/22 0947 01/01/22 1501 01/02/22 0658 01/03/22 0318 01/03/22 1601  HGB 14.1  --   --   --   --  15.5*  HCT 42.2  --   --   --   --  48.5*  PLT 374  --   --   --   --  318  APTT  --   --   --   --   --  26  HEPARINUNFRC 0.35 0.34  --  0.25* 0.40 <0.10*  CREATININE 0.71  --   --   --   --  0.72  TROPONINIHS 179* 116* 137*  --   --   --      Estimated Creatinine Clearance: 94.5 mL/min (by C-G formula based on SCr of 0.72 mg/dL).   Medical History: Past Medical History:  Diagnosis Date   Anxiety    Asthma    Blood transfusion without reported diagnosis    2010 CABG   CAD (coronary artery disease)    4v CABG, 9/10; NL LVF, status post followup Cardiolite November 2012 no ischemia ejection fraction 65%   Cataract    are forming   Diabetes mellitus    Dyslipidemia     LDL 22 mg percent on Crestor   Esophageal stricture    Fatty liver    Gastroparesis due to DM (HCC)    GERD  (gastroesophageal reflux disease)    Glaucoma    Hyperlipidemia    Hypertension    Hypothyroidism    Knee injury, right, initial encounter    Left-sided chest wall pain    Myocardial infarction (HCC) 2010   2008 x 2 stents, 2009 x 2 stents. 01-2009 CABG x 4      Assessment: 56 y/o F with cardiac history in the ED with "burning" in chest, mildly elevated trop. Pharmacy to dose heparin for ACS.   Pt s/p LHC today c/b code stroke. Pt s/p IR for mechanical thrombectomy. Per cardiology, resume heparin with no bolus 6h after thrombectomy. Will use low goal.  Goal of Therapy:  Heparin level 0.3-0.5 units/ml Monitor platelets by anticoagulation protocol: Yes   Plan:  -Resume heparin 1550 units/h no bolus at 2030 -Check heparin level 6h after starting  58, PharmD, BCPS, [facility] Clinical Pharmacist [phone] Please check AMION for all [facility] numbers 01/03/2022

## 2022-01-03 NOTE — H&P (Signed)
NEUROLOGY CONSULTATION NOTE   Date of service: January 03, 2022 Patient Name: [name] MRN:  [id number] DOB:  [birth year] Reason for consult: "Stroke code for aphasia after Left heart cath with no intervention" Requesting Provider: Charlynne Cousins, MD _ _ _   _ __   _ __ _ _  __ __   _ __   __ _  History of Present Illness  [name] is a 56 y.o. female with PMH significant for  CAD s/p 4 v CABBG, HTN, HLD, DM2, GERD, morbid obesity, hypothyroidism, admitted with ACS and underwent LHC cath today without any intervention.   She was given heparin bolus on 8/12 and started on heparin gtt and this was stopped at 1030 when she brought into cath lab.   LKW 1030 when she was wheeled into cath lab. heparin gtt was stopped right when she entered the cath lab.  Post procedure, she was noted to be aphasic and a stroke code was activated.  LKW: 1030 on 01/03/22 mRS: 1 tNKASE: not offered, heparin gtt within last 4 hours. Last Heparin level was therapeutic at 3AM at 0.4. Waiting on repeat heparin levels and APTT and the results are not back atleast at 3pm when signing this note. Thrombectomy: offered. Presentation discussed with patient's mother who is next of kin as patient felt unable to make decisions by herself given aphasia. Dr. Ethel Rana rodrigues discussed with patient's mother, details, risks and benefits of thrombectomy and mother consented to thrombectomy. Some delay to thrombectomy given difficult IV access and took more than 4 tries to establish IV access.  NIHSS components Score: Comment  1a Level of Conscious 0[x]  1[]  2[]  3[]      1b LOC Questions 0[]  1[]  2[x]       1c LOC Commands 0[]  1[]  2[x]       2 Best Gaze 0[x]  1[]  2[]       3 Visual 0[x]  1[]  2[]  3[]      4 Facial Palsy 0[x]  1[]  2[]  3[]      5a Motor Arm - left 0[x]  1[]  2[]  3[]  4[]  UN[]    5b Motor Arm - Right 0[x]  1[]  2[]  3[]  4[]  UN[]    6a Motor Leg - Left 0[x]  1[]  2[]  3[]  4[]  UN[]    6b Motor Leg - Right 0[x]  1[]   2[]  3[]  4[]  UN[]    7 Limb Ataxia 0[x]  1[]  2[]  3[]  UN[]     8 Sensory 0[x]  1[]  2[]  UN[]      9 Best Language 0[]  1[]  2[x]  3[]      10 Dysarthria 0[x]  1[]  2[]  UN[]      11 Extinct. and Inattention 0[x]  1[]  2[]       TOTAL: 6     ROS  Unable to obtain due to review of systems secondary to aphasia.  Past History   Past Medical History:  Diagnosis Date   Anxiety    Asthma    Blood transfusion without reported diagnosis    2010 CABG   CAD (coronary artery disease)    4v CABG, 9/10; NL LVF, status post followup Cardiolite November 2012 no ischemia ejection fraction 65%   Cataract    are forming   Diabetes mellitus    Dyslipidemia     LDL 22 mg percent on Crestor   Esophageal stricture    Fatty liver    Gastroparesis due to DM ([place])    GERD (gastroesophageal reflux disease)    Glaucoma    Hyperlipidemia    Hypertension    Hypothyroidism    Knee injury,  right, initial encounter    Left-sided chest wall pain    Myocardial infarction [facility]) 2010   2008 x 2 stents, 2009 x 2 stents. 01-2009 CABG x 4    Past Surgical History:  Procedure Laterality Date   ANGIOPLASTY     with stenting 2008,2009   CARDIAC SURGERY     CESAREAN SECTION     CORONARY ARTERY BYPASS GRAFT  01/26/2009   2008-2 stents; 2009-2 stents   EYE SURGERY     open heart surgery   2010   TUBAL LIGATION     Family History  Problem Relation Age of Onset   Heart disease Father    Asthma Father    COPD Father    Diabetes Mother    Asthma Paternal Aunt    Asthma Son    Colon cancer Neg Hx    Colon polyps Neg Hx    Esophageal cancer Neg Hx    Rectal cancer Neg Hx    Stomach cancer Neg Hx    Social History   Socioeconomic History   Marital status: Single    Spouse name: Not on file   Number of children: 1   Years of education: Not on file   Highest education level: Not on file  Occupational History   Occupation: disabilied  Tobacco Use   Smoking status: Former    Packs/day: 1.00    Years: 3.00     Total pack years: 3.00    Types: Cigarettes    Start date: 03/14/1991    Quit date: 05/22/1993    Years since quitting: 28.6   Smokeless tobacco: Never   Tobacco comments:     Year Quit: 1995  Vaping Use   Vaping Use: Never used  Substance and Sexual Activity   Alcohol use: No    Alcohol/week: 0.0 standard drinks of alcohol   Drug use: No   Sexual activity: Yes    Birth control/protection: Surgical, Post-menopausal    Comment: tubal  Other Topics Concern   Not on file  Social History Narrative   [facility] (03/19/17):   Originally from [place], [place]. Has always lived in [place]. Previously worked at [employer]. She is disabled due to her prior cardiac history. 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 No pets currently. No bird exposure. She reports she did have mold in a prior home that she lived in for 3 years. She has since moved to a new home. She enjoys walking.    Social Determinants of Health   Financial Resource Strain: Medium Risk (12/14/2020)   Overall Financial Resource Strain (CARDIA)    Difficulty of Paying Living Expenses: Somewhat hard  Food Insecurity: Food Insecurity Present (12/14/2020)   Hunger Vital Sign    Worried About Running Out of Food in the Last Year: Sometimes true    Ran Out of Food in the Last Year: Sometimes true  Transportation Needs: No Transportation Needs (12/14/2020)   PRAPARE - Administrator, Civil Service (Medical): No    Lack of Transportation (Non-Medical): No  Physical Activity: Insufficiently Active (12/14/2020)   Exercise Vital Sign    Days of Exercise per Week: 3 days    Minutes of Exercise per Session: 30 min  Stress: No Stress Concern Present (12/14/2020)   [employer] of Occupational Health - Occupational Stress Questionnaire    Feeling of Stress : Only a little  Social Connections: Moderately Integrated (12/14/2020)   Social  Connection and Isolation Panel  [NHANES]    Frequency of Communication with Friends and Family: More than three times a week    Frequency of Social Gatherings with Friends and Family: Once a week    Attends Religious Services: More than 4 times per year    Active Member of [employer] or Organizations: Yes    Attends Banker Meetings: 1 to 4 times per year    Marital Status: Divorced   Allergies  Allergen Reactions   Metformin Anaphylaxis   Penicillins Anaphylaxis and Other (See Comments)    Has patient had a PCN reaction causing immediate rash, facial/tongue/throat swelling, SOB or lightheadedness with hypotension: Yes Has patient had a PCN reaction causing severe rash involving mucus membranes or skin necrosis: No Has patient had a PCN reaction that required hospitalization No Has patient had a PCN reaction occurring within the last 10 years: No If all of the above answers are "NO", then may proceed with Cephalosporin use.   Metoclopramide Other (See Comments)    Reaction:  Nightmares     Medications   Medications Prior to Admission  Medication Sig Dispense Refill Last Dose   albuterol (VENTOLIN HFA) 108 (90 Base) MCG/ACT inhaler Inhale 2 puffs into the lungs every 6 (six) hours as needed for wheezing or shortness of breath. 8 g 1 unknown   ALPRAZolam (XANAX) 0.25 MG tablet Take 0.25 mg by mouth 2 (two) times daily as needed for anxiety or sleep.   1 Past Week   ammonium lactate (AMLACTIN) 12 % lotion Apply 1 application topically as needed for dry skin. 400 g 0 unknown   atenolol (TENORMIN) 25 MG tablet Take 1 tablet (25 mg total) by mouth daily. 90 tablet 2 12/30/2021 at 1000   Cholecalciferol (VITAMIN D3) 125 MCG (5000 UT) CAPS Take 1 tablet by mouth daily.   12/30/2021   ciclopirox (PENLAC) 8 % solution APPLY TOPICALLY AT BEDTIME. APPLY OVER NAIL AND SURROUNDING SKIN. APPLY DAILY OVER PREVIOUS COAT. AFTER SEVEN (7) DAYS, MAY REMOVE WITH ALCOHOL AND CONTINUE CYCLE. 6.6 mL 0 Past Week   cyclobenzaprine  (FLEXERIL) 10 MG tablet Take 10 mg by mouth 3 (three) times daily.   12/30/2021   Fluticasone-Umeclidin-Vilant (TRELEGY ELLIPTA) 100-62.5-25 MCG/INH AEPB Inhale into the lungs daily.   12/30/2021   ibuprofen (ADVIL) 800 MG tablet TAKE 1 TABLET BY MOUTH EVERY 8 HOURS AS NEEDED (Patient taking differently: Take 400 mg by mouth daily as needed for moderate pain.) 60 tablet 0 unknown   Insulin Human (INSULIN PUMP) SOLN Inject into the skin See admin instructions. Pt uses up to 150 units of NOVOLOG daily VIA PUMP   unknown   lansoprazole (PREVACID SOLUTAB) 30 MG disintegrating tablet Take 1 tablet (30 mg total) by mouth daily. 90 tablet 3 12/30/2021   levothyroxine (SYNTHROID) 137 MCG tablet Take 137 mcg by mouth daily before breakfast.   12/30/2021   liraglutide (VICTOZA) 18 MG/3ML SOPN Inject 1.8 mg into the skin daily.   12/30/2021   NOVOLOG 100 UNIT/ML injection Inject 100-150 Units into the skin as directed. Use up to 100-150 units daily in insulin pump as needed  5 12/30/2021   oxyCODONE-acetaminophen (PERCOCET/ROXICET) 5-325 MG tablet Take 1 tablet by mouth 3 (three) times daily as needed.   12/30/2021   prednisoLONE (PRELONE) 15 MG/5ML SOLN Take 15 mg by mouth 2 (two) times daily.   12/30/2021   Pseudoeph-Doxylamine-DM-APAP (NYQUIL PO) Take 30 mLs by mouth at bedtime as needed (for cough).  unknown   rosuvastatin (CRESTOR) 20 MG tablet Take 1 tablet (20 mg total) by mouth daily. 90 tablet 3 12/30/2021   triamcinolone ointment (KENALOG) 0.1 % Apply 1 application topically daily as needed.   unknown   Alirocumab (PRALUENT) 150 MG/ML SOAJ Inject 150 mg into the skin every 14 (fourteen) days. 6 mL 3 unknown   furosemide (LASIX) 40 MG tablet TAKE 1 TABLET BY MOUTH EVERY DAY (Patient not taking: Reported on 12/31/2021) 30 tablet 0 Not Taking   Magnesium Oxide 500 MG CAPS Take 1 capsule by mouth every morning. (Patient not taking: Reported on 12/31/2021)   Not Taking   Misc. Devices (CANE) MISC 1 standard cane for  standing and ambulation.  Diagnosis right knee pain. 1 each 0    nitroGLYCERIN (NITROSTAT) 0.4 MG SL tablet Place 1 tablet (0.4 mg total) under the tongue every 5 (five) minutes x 3 doses as needed for chest pain (if no relief after 2nd dose, proceed to the ED for an evaluation or call 911). (Patient not taking: Reported on 12/31/2021) 25 tablet 3 Not Taking     Vitals   Vitals:   01/03/22 1223 01/03/22 1228 01/03/22 1247 01/03/22 1248  BP: (!) 140/69 (!) 141/71 (!) 150/84 (!) 150/84  Pulse: 69 68 (!) 0 (!) 0  Resp: 20 20    Temp:      TempSrc:      SpO2: 97% 96% 98%   Weight:      Height:         Body mass index is 40.3 kg/m.  Physical Exam   General: Laying comfortably in bed; in no acute distress.  HENT: Normal oropharynx and mucosa. Normal external appearance of ears and nose.  Neck: Supple, no pain or tenderness  CV: No JVD. No peripheral edema.  Pulmonary: Symmetric Chest rise. Normal respiratory effort.  Abdomen: Soft to touch, non-tender.  Ext: No cyanosis, edema, or deformity  Skin: No rash. Normal palpation of skin.   Musculoskeletal: Normal digits and nails by inspection. No clubbing.   Neurologic Examination  Mental status/Cognition: Alert, oriented to self. Speech/language: non fluent, makes paraphasic errors and get stuck on several words.  Difficulty comprehending anything more than a simple command.  She is able to name some objects but does seem to miss several.  She is unable to repeat. Cranial nerves:   CN II Pupils equal and reactive to light, no VF deficits    CN III,IV,VI EOM intact, no gaze preference or deviation, no nystagmus    CN V normal sensation in V1, V2, and V3 segments bilaterally    CN VII no asymmetry, no nasolabial fold flattening    CN VIII normal hearing to speech    CN IX & X normal palatal elevation, no uvular deviation    CN XI 5/5 head turn and 5/5 shoulder shrug bilaterally    CN XII midline tongue protrusion    Motor:  Muscle  bulk: normal, tone normal, pronator drift none tremor none Mvmt Root Nerve  Muscle Right Left Comments  SA C5/6 Ax Deltoid 5 5   EF C5/6 Mc Biceps 5 5   EE C6/7/8 Rad Triceps 5 5   WF C6/7 Med FCR     WE C7/8 PIN ECU     F Ab C8/T1 U ADM/FDI 5 5   HF L1/2/3 Fem Illopsoas 5 5   KE L2/3/4 Fem Quad 5 5   DF L4/5 D Peron Tib Ant 5 5   PF  S1/2 Tibial Grc/Sol 5 5    Sensation:  Light touch Intact throughout   Pin prick    Temperature    Vibration   Proprioception    Coordination/Complex Motor:  - Finger to Nose intact bilaterally - Heel to shin unable to do due to body habitus - Gait: Deferred given the acuity of the situation. Labs   CBC:  Recent Labs  Lab 12/31/21 0203 01/01/22 0209  WBC 11.3* 12.2*  HGB 13.2 14.1  HCT 41.5 42.2  MCV 87.6 83.7  PLT 329 374    Basic Metabolic Panel:  Lab Results  Component Value Date   NA 137 01/01/2022   K 3.9 01/01/2022   CO2 25 01/01/2022   GLUCOSE 222 (H) 01/01/2022   BUN 7 01/01/2022   CREATININE 0.71 01/01/2022   CALCIUM 10.1 01/01/2022   GFRNONAA >60 01/01/2022   GFRAA >60 04/21/2019   Lipid Panel:  Lab Results  Component Value Date   LDLCALC 133 (H) 11/16/2020   HgbA1c:  Lab Results  Component Value Date   HGBA1C 7.8 (H) 12/31/2021   Urine Drug Screen: No results found for: "LABOPIA", "COCAINSCRNUR", "LABBENZ", "AMPHETMU", "THCU", "LABBARB"  Alcohol Level No results found for: "ETH"  CT Head without contrast(Personally reviewed): CTH was negative for a large hypodensity concerning for a large territory infarct or hyperdensity concerning for an ICH  CT angio Head and Neck with contrast(Personally reviewed): L MCA M2 occlusion with distal reconstitution.  MRI Brain(Personally reviewed): Pending  Impression   [name] is a 56 y.o. female with PMH significant for CAD s/p 4 v CABBG, HTN, HLD, DM2, GERD, morbid obesity, hypothyroidism, admitted with ACS and underwent LHC cath without any intervention.  Heparin was stopped at 1030, just prior to procedure as he was wheeled in. Post procedure, noted to be aphasic and stroke code activated. She was found to have a L MCA M2 occlusion.  Primary Diagnosis:  Cerebral infarction due to embolism of  left middle cerebral artery.   Secondary Diagnosis: Essential (primary) hypertension, Type 2 diabetes mellitus with hyperglycemia , and Morbid Obesity(BMI > 40)  Recommendations   Acute L MCA stroke in the setting of cardiac cath: - Frequent Neuro checks per stroke unit protocol - Recommend brain imaging with MRI Brain without contrast - No need to repeat TTE. - Recommend obtaining Lipid panel with LDL - Please start statin if LDL > 70 - Recommend HbA1c - Antithrombotic - per Neuro IR for the first 24 hours after thrombectomy. - Recommend DVT ppx - SBP goal - per Neuro Ir for the first 24 hours after thrombectomy. - Recommend Telemetry monitoring for arrythmia - Recommend bedside swallow screen prior to PO intake. - Stroke education booklet - Recommend PT/OT/SLP consult   Atypical chest pain with elevated troponins: 2D echo showed an EF of 60% with grade 1 diastolic heart failure Cardiology following.   Diabetes mellitus type 2, insulin dependent (HCC) Continue Neurontin, A1c 7.8. Hyperglycemic on admission. Started on long-acting insulin plus sliding scale her blood glucose well controlled continue current regimen.    GERD: EGD planned as an outpatient to stay for dilation. Continue PPI.   Hypothyroidism: Continue Synthroid.   Dyslipidemia: Continue Crestor.   Morbid obesity: With a BMI greater than 20 counseled.  This patient is critically ill and at significant risk of neurological worsening, death and care requires constant monitoring of vital signs, hemodynamics,respiratory and cardiac monitoring, neurological assessment, discussion with family, other specialists and medical decision making of high  complexity. I spent 90  minutes of neurocritical care time  in the care of  this patient. This was time spent independent of any time provided by nurse practitioner or PA.  Donnetta Simpers [facility] Pager Number [phone] 01/03/2022  2:58 PM  ______________________________________________________________________   Thank you for the opportunity to take part in the care of this patient. If you have any further questions, please contact the neurology consultation attending.  Signed,  [place] Pager Number [phone] _ _ _   _ __   _ __ _ _  __ __   _ __   __ _

## 2022-01-03 NOTE — Anesthesia Procedure Notes (Addendum)
Procedure Name: Intubation Date/Time: 01/03/2022 1:45 PM  Performed by: Janene Harvey, CRNAPre-anesthesia Checklist: Patient identified, Emergency Drugs available, Suction available and Patient being monitored Patient Re-evaluated:Patient Re-evaluated prior to induction Oxygen Delivery Method: Circle System Utilized Preoxygenation: Pre-oxygenation with 100% oxygen Induction Type: IV induction Ventilation: Mask ventilation without difficulty Laryngoscope Size: Mac and 4 Grade View: Grade I Tube type: Oral Tube size: 7.5 mm Number of attempts: 1 Airway Equipment and Method: Stylet Placement Confirmation: ETT inserted through vocal cords under direct vision, positive ETCO2 and breath sounds checked- equal and bilateral Secured at: 21 cm Tube secured with: Tape Dental Injury: Teeth and Oropharynx as per pre-operative assessment

## 2022-01-03 NOTE — Code Documentation (Signed)
Stroke Response Nurse Documentation Code Documentation  CLYDELL ALBERTS is a 56 y.o. female admitted to [facility] 12/31/2021 with concern for NSTEMI. Past medical history significant of anxiety, diabetes, GERD, esophageal stricture, coronary artery disease, hypertension, dyslipidemia, hypothyroidism, and MI s/p stents and CABG. On aspirin 81 mg daily.   Schedule heart catheterization today. Heparin gtt stopped prior to procedure. Post procedure, sheath removed and RN noted patient wasn't speaking normal. Code stroke activated by cath lab. LKW approximately 1030.  Stroke team met patient in cath lab. Patient to CT with team. Heparin level and APTT labs drawn in CT. Difficulty obtaining PIV for CTA. Ultrasound used for placement. NIHSS 4, see documentation for details and code stroke times. Patient with disoriented, Expressive aphasia , and dysarthria  on exam. The following imaging was completed:  CT Head and CTA. Patient is not a candidate for IV Thrombolytic due to recent heparin and waiting on lab results. Patient is a candidate for IR due to CT finding "mid left M2 MCA branch occlusion with reconstitution more distally". Code IR activated. Patient taken to IR. Consent obtained and pulses marked.  Care Plan: admit to ICU post IR.   Leverne Humbles Stroke Response RN

## 2022-01-03 NOTE — Transfer of Care (Signed)
Immediate Anesthesia Transfer of Care Note  Patient: [name]  Procedure(s) Performed: IR WITH ANESTHESIA  Patient Location: PACU  Anesthesia Type:General  Level of Consciousness: awake, alert  and patient cooperative  Airway & Oxygen Therapy: Patient Spontanous Breathing and Patient connected to face mask oxygen  Post-op Assessment: Report given to RN, Post -op Vital signs reviewed and stable and Patient moving all extremities X 4  Post vital signs: Reviewed and stable  Last Vitals:  Vitals Value Taken Time  BP 131/86   Temp    Pulse 84   Resp 14   SpO2 97%     Last Pain:  Vitals:   01/03/22 1141  TempSrc:   PainSc: 0-No pain         Complications: No notable events documented.

## 2022-01-03 NOTE — Sedation Documentation (Signed)
Pt transported to PACU bay 13 with  CRNA and this RN. Report given to PACU RN. Right femoral site assessed. No hematoma noted. Pulses present in right foot.

## 2022-01-03 NOTE — Anesthesia Preprocedure Evaluation (Addendum)
Anesthesia Evaluation  Patient identified by MRN, date of birth, ID band Patient awake    Reviewed: Allergy & Precautions, NPO status , Unable to perform ROS - Chart review only  History of Anesthesia Complications Negative for: history of anesthetic complications  Airway Mallampati: I  TM Distance: >3 FB Neck ROM: Full    Dental  (+) Edentulous Upper   Pulmonary former smoker,    Pulmonary exam normal        Cardiovascular hypertension, + CAD, + Past MI, + Cardiac Stents and + CABG  Normal cardiovascular exam  Echo 12/2021 IMPRESSIONS    1. Left ventricular ejection fraction, by estimation, is 60 to 65%. The  left ventricle has normal function. The left ventricle has no regional  wall motion abnormalities. There is mild concentric left ventricular  hypertrophy. Left ventricular diastolic  parameters are consistent with Grade I diastolic dysfunction (impaired  relaxation).  2. Right ventricular systolic function was not well visualized. The right  ventricular size is normal.  3. The mitral valve is normal in structure. Mild mitral valve  regurgitation. No evidence of mitral stenosis.  4. The aortic valve is tricuspid. Aortic valve regurgitation is not  visualized. Aortic valve sclerosis is present, with no evidence of aortic  valve stenosis.  5. The inferior vena cava is normal in size with greater than 50%  respiratory variability, suggesting right atrial pressure of 3 mmHg.   Comparison(s): Compared to prior echo report in 2019, there is no  signfiicant change.    Neuro/Psych PSYCHIATRIC DISORDERS Anxiety    GI/Hepatic GERD  ,  Endo/Other  diabetesHypothyroidism Morbid obesity  Renal/GU      Musculoskeletal   Abdominal   Peds  Hematology   Anesthesia Other Findings   Reproductive/Obstetrics                            Anesthesia Physical Anesthesia Plan  ASA: 4 and  emergent  Anesthesia Plan: General   Post-op Pain Management: Minimal or no pain anticipated   Induction: Intravenous  PONV Risk Score and Plan: 3 and Ondansetron and Treatment may vary due to age or medical condition  Airway Management Planned: Oral ETT  Additional Equipment:   Intra-op Plan:   Post-operative Plan: Extubation in OR  Informed Consent: I have reviewed the patients History and Physical, chart, labs and discussed the procedure including the risks, benefits and alternatives for the proposed anesthesia with the patient or authorized representative who has indicated his/her understanding and acceptance.     Dental advisory given  Plan Discussed with: CRNA, Anesthesiologist and Surgeon  Anesthesia Plan Comments:         Anesthesia Quick Evaluation

## 2022-01-03 NOTE — Anesthesia Postprocedure Evaluation (Signed)
Anesthesia Post Note  Patient: [name]  Procedure(s) Performed: IR WITH ANESTHESIA     Patient location during evaluation: PACU Anesthesia Type: General Level of consciousness: sedated Pain management: pain level controlled Vital Signs Assessment: post-procedure vital signs reviewed and stable Respiratory status: spontaneous breathing and respiratory function stable Cardiovascular status: stable Postop Assessment: no apparent nausea or vomiting Anesthetic complications: no   No notable events documented.  Last Vitals:  Vitals:   01/03/22 1545 01/03/22 1600  BP: 132/74 123/73  Pulse: 86 (!) 52  Resp: 14 15  Temp:    SpO2: 97% 95%    Last Pain:  Vitals:   01/03/22 1545  TempSrc:   PainSc: 0-No pain                 Efrat Zuidema DANIEL

## 2022-01-04 ENCOUNTER — Encounter (HOSPITAL_COMMUNITY): Payer: Self-pay | Admitting: Cardiovascular Disease

## 2022-01-04 DIAGNOSIS — I214 Non-ST elevation (NSTEMI) myocardial infarction: Secondary | ICD-10-CM | POA: Diagnosis not present

## 2022-01-04 LAB — GLUCOSE, CAPILLARY
Glucose-Capillary: 222 mg/dL — ABNORMAL HIGH (ref 70–99)
Glucose-Capillary: 299 mg/dL — ABNORMAL HIGH (ref 70–99)
Glucose-Capillary: 298 mg/dL — ABNORMAL HIGH (ref 70–99)
Glucose-Capillary: 199 mg/dL — ABNORMAL HIGH (ref 70–99)

## 2022-01-04 LAB — COMPREHENSIVE METABOLIC PANEL
ALT: 17 U/L (ref 0–44)
AST: 20 U/L (ref 15–41)
Albumin: 3.3 g/dL — ABNORMAL LOW (ref 3.5–5.0)
Alkaline Phosphatase: 137 U/L — ABNORMAL HIGH (ref 38–126)
Anion gap: 9 (ref 5–15)
BUN: 10 mg/dL (ref 6–20)
CO2: 22 mmol/L (ref 22–32)
Calcium: 9.8 mg/dL (ref 8.9–10.3)
Chloride: 106 mmol/L (ref 98–111)
Creatinine, Ser: 0.8 mg/dL (ref 0.44–1.00)
GFR, Estimated: >60 mL/min (ref >60)
Glucose, Bld: 220 mg/dL — ABNORMAL HIGH (ref 70–99)
Potassium: 4 mmol/L (ref 3.5–5.1)
Sodium: 137 mmol/L (ref 135–145)
Total Bilirubin: 0.7 mg/dL (ref 0.3–1.2)
Total Protein: 7 g/dL (ref 6.5–8.1)

## 2022-01-04 LAB — CBC
HCT: 41.9 % (ref 36.0–46.0)
Hemoglobin: 13.9 g/dL (ref 12.0–15.0)
MCH: 27.9 pg (ref 26.0–34.0)
MCHC: 33.2 g/dL (ref 30.0–36.0)
MCV: 84 fL (ref 80.0–100.0)
Platelets: 330 10*3/uL (ref 150–400)
RBC: 4.99 MIL/uL (ref 3.87–5.11)
RDW: 14.6 % (ref 11.5–15.5)
WBC: 14.9 10*3/uL — ABNORMAL HIGH (ref 4.0–10.5)
nRBC: 0 % (ref 0.0–0.2)

## 2022-01-04 LAB — LIPID PANEL
Cholesterol: 169 mg/dL (ref 0–200)
HDL: 56 mg/dL (ref >40)
LDL Cholesterol: 92 mg/dL (ref 0–99)
Total CHOL/HDL Ratio: 3 RATIO
Triglycerides: 105 mg/dL (ref <150)
VLDL: 21 mg/dL (ref 0–40)

## 2022-01-04 LAB — HEPARIN LEVEL (UNFRACTIONATED)
Heparin Unfractionated: 0.19 IU/mL — ABNORMAL LOW (ref 0.30–0.70)
Heparin Unfractionated: 0.5 IU/mL (ref 0.30–0.70)

## 2022-01-04 MED ORDER — SODIUM CHLORIDE 0.9 % IV SOLN: INTRAVENOUS | Status: DC

## 2022-01-04 MED ORDER — ONDANSETRON HCL 4 MG/2ML IJ SOLN: 4.0000 mg | Freq: Four times a day (QID) | INTRAMUSCULAR | Status: DC | PRN

## 2022-01-04 MED ORDER — CHLORHEXIDINE GLUCONATE CLOTH 2 % EX PADS
6.0000 | MEDICATED_PAD | Freq: Every day | CUTANEOUS | Status: DC
  Administered 2022-01-04 – 2022-01-05 (×2): 6 via TOPICAL

## 2022-01-04 MED ORDER — CLOPIDOGREL BISULFATE 75 MG PO TABS
75.0000 mg | ORAL_TABLET | Freq: Every day | ORAL | Status: DC
  Administered 2022-01-04 – 2022-01-05 (×2): 75 mg via ORAL
  Filled 2022-01-04 (×2): qty 1

## 2022-01-04 MED ORDER — CLOPIDOGREL BISULFATE 75 MG PO TABS: 75.0000 mg | ORAL_TABLET | Freq: Every day | ORAL | Status: DC

## 2022-01-04 MED ORDER — SODIUM CHLORIDE 0.9 % IV SOLN: 250.0000 mL | INTRAVENOUS | Status: DC | PRN

## 2022-01-04 MED ORDER — ACETAMINOPHEN 325 MG PO TABS: 650.0000 mg | ORAL_TABLET | ORAL | Status: DC | PRN

## 2022-01-04 MED ORDER — DIAZEPAM 5 MG PO TABS: 5.0000 mg | ORAL_TABLET | Freq: Four times a day (QID) | ORAL | Status: DC | PRN

## 2022-01-04 MED ORDER — SODIUM CHLORIDE 0.9% FLUSH: 3.0000 mL | INTRAVENOUS | Status: DC | PRN

## 2022-01-04 MED ORDER — ASPIRIN 81 MG PO CHEW: 81.0000 mg | CHEWABLE_TABLET | Freq: Every day | ORAL | Status: DC

## 2022-01-04 MED ORDER — LABETALOL HCL 5 MG/ML IV SOLN: 10.0000 mg | INTRAVENOUS | Status: DC | PRN

## 2022-01-04 MED ORDER — SODIUM CHLORIDE 0.9% FLUSH: 3.0000 mL | Freq: Two times a day (BID) | INTRAVENOUS | Status: DC

## 2022-01-04 MED ORDER — HYDRALAZINE HCL 20 MG/ML IJ SOLN: 10.0000 mg | INTRAMUSCULAR | Status: DC | PRN

## 2022-01-04 NOTE — Evaluation (Signed)
Physical Therapy Evaluation Patient Details Name: [name] MRN: [id number] DOB: [birth year] Today's Date: 01/04/2022  History of Present Illness  Pt is a 56 y/o female presenting on 8/12 for burning in her chest. S/P L heart cath and angiography 8/15. Complicated by post procedure aphasia. CT with L MCA stroke, MRI with small acute infarcts in L hippocampus, L insula, L posterior frontal lobe and white matter along the atrium of the L lateral ventricle.S/P thrombectomy 8/15.   PMH includes: CAD with prior stents and CABG x 4 in 2010, obesity, recurrent esophageal strictures, DM2, anxiety, glaucoma, HTN.  Clinical Impression  Patient presents with decreased mobility due to deficits listed in PT problem list.  Currently needing minguard overall for mobility after prolonged bedrest.  Previously pt independent with ADL/IADL's though on disability and not working.  She lives with her fiance in second floor apartment and has RW and cane at home if needed.  She will benefit from skilled PT in the acute setting, however feel she may not need follow up PT services at d/c.  PT will continue to follow and ensure safety on stairs for home entry.        Recommendations for follow up therapy are one component of a multi-disciplinary discharge planning process, led by the attending physician.  Recommendations may be updated based on patient status, additional functional criteria and insurance authorization.  Follow Up Recommendations No PT follow up      Assistance Recommended at Discharge Intermittent Supervision/Assistance  Patient can return home with the following  Help with stairs or ramp for entrance;Assist for transportation;Assistance with cooking/housework;A little help with bathing/dressing/bathroom    Equipment Recommendations None recommended by PT  Recommendations for Other Services       Functional Status Assessment Patient has had a recent decline in their functional status and  demonstrates the ability to make significant improvements in function in a reasonable and predictable amount of time.     Precautions / Restrictions Precautions Precautions: Fall      Mobility  Bed Mobility Overal bed mobility: Needs Assistance Bed Mobility: Supine to Sit     Supine to sit: HOB elevated, Supervision     General bed mobility comments: assist for lines    Transfers Overall transfer level: Needs assistance   Transfers: Sit to/from Stand Sit to Stand: Min guard           General transfer comment: assist for initial balance, lines    Ambulation/Gait Ambulation/Gait assistance: Supervision, Min guard, Min assist Gait Distance (Feet): 200 Feet Assistive device: None Gait Pattern/deviations: Step-through pattern, Decreased stride length, Staggering left       General Gait Details: mild instability with ambulation, but pt reporting close to baseline; one LOB when looking to R staggered L with min A recovery  Stairs            Wheelchair Mobility    Modified Rankin (Stroke Patients Only) Modified Rankin (Stroke Patients Only) Pre-Morbid Rankin Score: No significant disability Modified Rankin: Moderately severe disability     Balance Overall balance assessment: Needs assistance   Sitting balance-Leahy Scale: Good       Standing balance-Leahy Scale: Good Standing balance comment: ambulating without device, though imbalance noted only one LOB when turning head to look R                             Pertinent Vitals/Pain      Home Living  Family/patient expects to be discharged to:: Private residence Living Arrangements: Spouse/significant other (fiance) Available Help at Discharge: Family Type of Home: Apartment Home Access: Stairs to enter Entrance Stairs-Rails: Doctor, general practice of Steps: 15 (2nd floor)   Home Layout: One level Home Equipment: Agricultural consultant (2 wheels);Cane - single point;Grab bars -  tub/shower Additional Comments: fiance works 5am -230pm    Prior Function Prior Level of Function : Independent/Modified Independent;Driving               ADLs Comments: independent IADLs, driving; enjoys puzzle books, cleaning     Hand Dominance   Dominant Hand: Right    Extremity/Trunk Assessment   Upper Extremity Assessment Upper Extremity Assessment: Overall WFL for tasks assessed    Lower Extremity Assessment Lower Extremity Assessment: Overall WFL for tasks assessed    Cervical / Trunk Assessment Cervical / Trunk Assessment: Normal  Communication      Cognition     Overall Cognitive Status: Within Functional Limits for tasks assessed                                          General Comments General comments (skin integrity, edema, etc.): VSS on RA; brushed teeth in bathroom with S intermittent UE support    Exercises     Assessment/Plan    PT Assessment Patient needs continued PT services  PT Problem List Decreased mobility;Decreased safety awareness;Decreased balance       PT Treatment Interventions Therapeutic exercise;Balance training;Gait training;Stair training;Neuromuscular re-education;Functional mobility training;Therapeutic activities;Patient/family education;DME instruction    PT Goals (Current goals can be found in the Care Plan section)  Acute Rehab PT Goals Patient Stated Goal: to return to independent PT Goal Formulation: With patient Time For Goal Achievement: 01/18/22 Potential to Achieve Goals: Good    Frequency Min 4X/week     Co-evaluation               AM-PAC PT "6 Clicks" Mobility  Outcome Measure Help needed turning from your back to your side while in a flat bed without using bedrails?: A Little Help needed moving from lying on your back to sitting on the side of a flat bed without using bedrails?: A Little Help needed moving to and from a bed to a chair (including a wheelchair)?: A Little Help  needed standing up from a chair using your arms (e.g., wheelchair or bedside chair)?: A Little Help needed to walk in hospital room?: A Little Help needed climbing 3-5 steps with a railing? : Total 6 Click Score: 16    End of Session Equipment Utilized During Treatment: Gait belt Activity Tolerance: Patient tolerated treatment well Patient left: in chair;with call bell/phone within reach;with chair alarm set   PT Visit Diagnosis: Other abnormalities of gait and mobility (R26.89)    Time: 7846-9629 PT Time Calculation (min) (ACUTE ONLY): 28 min   Charges:   PT Evaluation $PT Eval Moderate Complexity: 1 Mod PT Treatments $Gait Training: 8-22 mins        Sheran Lawless, PT Acute Rehabilitation Services Office:[phone] 01/04/2022   Elray Mcgregor 01/04/2022, 10:55 AM

## 2022-01-04 NOTE — Evaluation (Addendum)
Speech Language Pathology Evaluation Patient Details Name: [name] MRN: [id number] DOB: [birth year] Today's Date: 01/04/2022 Time: 1540-1600 SLP Time Calculation (min) (ACUTE ONLY): 20 min  Problem List:  Patient Active Problem List   Diagnosis Date Noted   Acute ischemic left MCA stroke ([place]) 01/03/2022   NSTEMI (non-ST elevated myocardial infarction) ([place]) 12/31/2021   Screening mammogram for breast cancer 12/14/2020   Encounter for screening fecal occult blood testing 12/14/2020   Post-menopause 12/14/2020   Encounter for well woman exam with routine gynecological exam 12/14/2020   Acute respiratory failure with hypoxia ([place]) 04/17/2019   Uncontrolled type 1 diabetes mellitus with hypoglycemia without coma, with long-term current use of insulin ([place]) 04/17/2019   COVID-19 virus infection    Pneumonia due to COVID-19 virus 04/16/2019   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 ([place]) 03/01/2019   Acute bronchitis 07/18/2017   Oral thrush 04/19/2017   Chronic seasonal allergic rhinitis 03/19/2017   Diabetic retinopathy of both eyes without macular edema associated with type 2 diabetes mellitus ([place]) 08/30/2016   Mild persistent asthma without complication [id number]   Diabetes mellitus with complication ([facility])    Atypical chest pain    Muscle spasm 08/20/2014   Plantar fasciitis of right foot 03/04/2014   Obesity, unspecified 08/03/2013   Hx of CABG    Ejection fraction    HTN (hypertension) 08/22/2012   Adjustment disorder with mixed anxiety and depressed mood 03/05/2012   Insulin pump in place 02/06/2012   Hypothyroidism 09/29/2011   Edema 08/25/2011   Gastroparesis 09/07/2010   Vitamin D deficiency 08/10/2010   Hyperlipidemia 02/21/2010   Muscle spasm of left shoulder 04/29/2009   PALPITATIONS 04/12/2009   WHEEZING 04/12/2009   GERD 01/19/2009   Chest pain 02/04/2008   Diabetes mellitus type 2, insulin dependent ([place]) 06/12/2007    Anxiety disorder 06/12/2007   Coronary artery disease involving coronary bypass graft of native heart 06/12/2007   Asthma in adult 06/12/2007   Past Medical History:  Past Medical History:  Diagnosis Date   Anxiety    Asthma    Blood transfusion without reported diagnosis    2010 CABG   CAD (coronary artery disease)    4v CABG, 9/10; NL LVF, status post followup Cardiolite November 2012 no ischemia ejection fraction 65%   Cataract    are forming   Diabetes mellitus    Dyslipidemia     LDL 22 mg percent on Crestor   Esophageal stricture    Fatty liver    Gastroparesis due to DM ([place])    GERD (gastroesophageal reflux disease)    Glaucoma    Hyperlipidemia    Hypertension    Hypothyroidism    Knee injury, right, initial encounter    Left-sided chest wall pain    Myocardial infarction ([place]) 2010   2008 x 2 stents, 2009 x 2 stents. 01-2009 CABG x 4    Past Surgical History:  Past Surgical History:  Procedure Laterality Date   ANGIOPLASTY     with stenting 2008,2009   CARDIAC SURGERY     CESAREAN SECTION     CORONARY ARTERY BYPASS GRAFT  01/26/2009   2008-2 stents; 2009-2 stents   EYE SURGERY     IR CT HEAD LTD  01/03/2022   IR PERCUTANEOUS ART THROMBECTOMY/INFUSION INTRACRANIAL INC DIAG ANGIO  01/03/2022   IR US GUIDE VASC ACCESS RIGHT  01/03/2022   LEFT HEART CATH AND CORS/GRAFTS ANGIOGRAPHY N/A 01/03/2022   Procedure: LEFT HEART CATH AND CORS/GRAFTS  ANGIOGRAPHY;  Surgeon: Lennette Bihari, MD;  Location[facility] INVASIVE CV LAB;  Service: Cardiovascular;  Laterality: N/A;   open heart surgery   2010   RADIOLOGY WITH ANESTHESIA N/A 01/03/2022   Procedure: IR WITH ANESTHESIA;  Surgeon: Radiologist, Medication, MD;  Location: [facility] OR;  Service: Radiology;  Laterality: N/A;   TUBAL LIGATION     HPI:  Pt is a 56 y.o. female who presented 8/12 with worsening burning sensation to her chest that started approximately 3 days prior to admission. Pt admitted with ACS and underwent LHC cath  8/15. Post-procedure, she was noted to have aphasia and code stroke was activated. Small acute infarcts in the left hippocampus, left insula, left posterior frontal lobe, and white matter along the atrium of the left lateral ventricle. CT revealed "mid left M2 MCA branch occlusion with reconstitution more distally". Pt s/p IR thrombectomy 8/15. PMH: CAD with prior stents and CABG four-vessel in 2010, obesity, recurrent esophageal strictures, GERD, hypothyroidism, and type 2 diabetes.   Assessment / Plan / Recommendation Clinical Impression  Pt exhibits cognitive impairments scoring a 15/30 on the SLUMS with inaccuracies in the areas of memory (retrieval), problem sloving, clock drawing amd retention of paragraph information. Demonstrated CN VII deficits marked by right facial droop and decreased ROM. Her language was intact and speech intelligible. She completed 12th grade, is on disability, lives with her fiance and was independent with finances and medication management at baseline. She would benefit from further ST to facilitate cognition and independence at home.    SLP Assessment  SLP Recommendation/Assessment: Patient needs continued Speech Lanaguage Pathology Services SLP Visit Diagnosis: Cognitive communication deficit (R41.841)    Recommendations for follow up therapy are one component of a multi-disciplinary discharge planning process, led by the attending physician.  Recommendations may be updated based on patient status, additional functional criteria and insurance authorization.    Follow Up Recommendations   (TBD)    Assistance Recommended at Discharge     Functional Status Assessment Patient has had a recent decline in their functional status and demonstrates the ability to make significant improvements in function in a reasonable and predictable amount of time.  Frequency and Duration min 2x/week  2 weeks      SLP Evaluation Cognition  Overall Cognitive Status:  Impaired/Different from baseline Arousal/Alertness: Awake/alert Orientation Level: Oriented X4 Year: 2023 Day of Week: Correct Attention: Sustained Sustained Attention: Appears intact Memory: Impaired Memory Impairment: Retrieval deficit Awareness: Appears intact Problem Solving: Impaired Problem Solving Impairment: Verbal basic Safety/Judgment: Appears intact       Comprehension  Auditory Comprehension Overall Auditory Comprehension: Appears within functional limits for tasks assessed Yes/No Questions: Within Functional Limits Commands: Within Functional Limits Visual Recognition/Discrimination Discrimination: Not tested Reading Comprehension Reading Status: Not tested    Expression Expression Primary Mode of Expression: Verbal Verbal Expression Overall Verbal Expression: Appears within functional limits for tasks assessed Initiation: No impairment Level of Generative/Spontaneous Verbalization: Conversation Naming: Impairment Divergent:  (named 9 animals in one min) Pragmatics: No impairment Written Expression Dominant Hand: Right Written Expression: Not tested   Oral / Motor  Oral Motor/Sensory Function Overall Oral Motor/Sensory Function: Mild impairment Facial ROM: Suspected CN VII (facial) dysfunction;Reduced right Facial Symmetry: Abnormal symmetry right;Suspected CN VII (facial) dysfunction Facial Strength: Reduced right;Suspected CN VII (facial) dysfunction Facial Sensation: Within Functional Limits Lingual ROM: Within Functional Limits Lingual Symmetry: Within Functional Limits Lingual Strength: Within Functional Limits Velum: Within Functional Limits Mandible: Within Functional Limits Motor Speech Overall Motor Speech:  Appears within functional limits for tasks assessed Intelligibility: Intelligible Motor Planning: Witnin functional limits            Royce Macadamia 01/04/2022, 4:31 PM

## 2022-01-04 NOTE — Progress Notes (Addendum)
STROKE TEAM PROGRESS NOTE   INTERVAL HISTORY Vital signs stable. WBC 14.9. MRI with small acute infarcts in L hippocampus, insula, posterior frontal lobe, and white matter along atrium of L lateral ventricle.   Patient CT angio with mid left M2 MCA branch occlusion s/p thrombectomy for mid left M2/MCA branch occlusion with TICI3 recanalization. Post IR CT with no evidence of hemorrhagic complication.  Patient is awake, alert, ambulating today. Patient remembers coming to the hospital for procedure for her heart but doesn't recall what happened leading up to the stroke. Per chart review, patient was admitted with ACS and underwent LHC cath and was noted to be aphasic post procedure. Patient has CAD s/p 4v CABG and initially presented with unstable angina. Cath scheduled given elevated troponins with unremarkable EKG and recent low risk nuclear stress test. She is on dual therapy for her dyslipidemia.   Vitals:   01/04/22 0700 01/04/22 0800 01/04/22 0900 01/04/22 1000  BP: 133/60 112/61 (!) 124/52 127/60  Pulse: 71 78 82 78  Resp: 16 (!) $Remo'21 14 16  'KUgjZ$ Temp:  (!) 97.5 F (36.4 C)    TempSrc:  Oral    SpO2: 97% 93% 99% 96%  Weight:      Height:       CBC:  Recent Labs  Lab 01/03/22 1601 01/04/22 0247  WBC 11.5* 14.9*  HGB 15.5* 13.9  HCT 48.5* 41.9  MCV 86.1 84.0  PLT 318 681   Basic Metabolic Panel:  Recent Labs  Lab 01/01/22 0209 01/03/22 1601 01/04/22 0247  NA 137  --  137  K 3.9  --  4.0  CL 103  --  106  CO2 25  --  22  GLUCOSE 222*  --  220*  BUN 7  --  10  CREATININE 0.71 0.72 0.80  CALCIUM 10.1  --  9.8  MG 2.0  --   --    Lipid Panel:  Recent Labs  Lab 01/04/22 0247  CHOL 169  TRIG 105  HDL 56  CHOLHDL 3.0  VLDL 21  LDLCALC 92   HgbA1c:  Recent Labs  Lab 12/31/21 0231  HGBA1C 7.8*   Urine Drug Screen: No results for input(s): "LABOPIA", "COCAINSCRNUR", "LABBENZ", "AMPHETMU", "THCU", "LABBARB" in the last 168 hours.  Alcohol Level No results for  input(s): "ETH" in the last 168 hours.  IMAGING past 24 hours MR BRAIN WO CONTRAST  Result Date: 01/04/2022 CLINICAL DATA:  Status post thrombectomy. EXAM: MRI HEAD WITHOUT CONTRAST TECHNIQUE: Multiplanar, multiecho pulse sequences of the brain and surrounding structures were obtained without intravenous contrast. COMPARISON:  CT and CTA 01/03/2022 FINDINGS: Brain: Linear restricted diffusion with ADC correlate extending from the white matter along the atrium of the left lateral ventricle, extending into the posterior left hippocampus (series 2, images 23-30) as well as along the left insula (series 2, images 23-25), with additional smaller foci of restricted diffusion in the anterior left hippocampus (series 2, image 22) and posterior left frontal lobe (series 2, image 42). No acute hemorrhage, mass mass effect, or midline shift. No hydrocephalus or extra-axial collection. No hemosiderin deposition to suggest remote hemorrhage. Vascular: Normal arterial flow voids. Loss of the left sigmoid and jugular flow void is favored to represent slow flow, given contrast opacification on 01/03/2022 CTA. Skull and upper cervical spine: Normal marrow signal. Sinuses/Orbits: No acute finding. Other: Trace fluid in the mastoid air cells. IMPRESSION: Small acute infarcts in the left hippocampus, left insula, left posterior frontal lobe, and white matter  along the atrium of the left lateral ventricle. No additional acute intracranial process. Electronically Signed   By: Merilyn Baba M.D.   On: 01/04/2022 02:05   IR PERCUTANEOUS ART THROMBECTOMY/INFUSION INTRACRANIAL INC DIAG ANGIO  Result Date: 01/03/2022 INDICATION: 56 year old female with past medical history significant for coronary artery disease status post 4 vessel CABG, hypertension, hyperlipidemia, type 2 diabetes mellitus, GERD, morbid obesity and hypothyroidism. Patient underwent a diagnostic coronary angiography today and was noted to be aphasic postprocedure;  NIHSS 6. Her last known well was 10:30 a.m. on 01/03/2022. Head CT showed no acute intracranial abnormality. CT angiogram of the head and neck showed a mid left M2/MCA posterior division branch occlusion. She was then transferred to our service for a diagnostic cerebral angiogram and mechanical thrombectomy. EXAM: ULTRASOUND-GUIDED VASCULAR ACCESS DIAGNOSTIC CEREBRAL ANGIOGRAM MECHANICAL THROMBECTOMY FLAT PANEL HEAD CT COMPARISON:  CT/CT angiogram head and neck January 03, 2022. MEDICATIONS: Refer to anesthesia documentation. ANESTHESIA/SEDATION: The procedure was performed under general anesthesia. CONTRAST:  50 mL of Omnipaque 300 milligram/mL FLUOROSCOPY: Radiation Exposure Index (as provided by the fluoroscopic device): 768 mGy Kerma COMPLICATIONS: None immediate. TECHNIQUE: Informed written consent was obtained from the patient after a thorough discussion of the procedural risks, benefits and alternatives. All questions were addressed. Maximal Sterile Barrier Technique was utilized including caps, mask, sterile gowns, sterile gloves, sterile drape, hand hygiene and skin antiseptic. A timeout was performed prior to the initiation of the procedure. The right groin was prepped and draped in the usual sterile fashion. Using a micropuncture kit and the modified Seldinger technique, access was gained to the right common femoral artery and an 8 French sheath was placed. Real-time ultrasound guidance was utilized for vascular access including the acquisition of a permanent ultrasound image documenting patency of the accessed vessel. Under fluoroscopy, a Zoom 88 guide catheter was navigated over a 6 [place] Berenstein 2 catheter and a 0.035" Terumo Glidewire into the aortic arch. The catheter was placed into the left common carotid artery and then advanced into the left internal carotid artery. The diagnostic catheter was removed. Frontal and lateral angiograms of the head were obtained. FINDINGS: 1. Normal caliber of the  right common femoral artery, adequate for vascular access. 2. Occlusion of the mid left M2/MCA posterior division branch. PROCEDURE: Using biplane roadmap, a Zoom 55 aspiration catheter was navigated over Colossus 35 microguidewire into the cavernous segment of the left ICA. The aspiration catheter was then advanced to the distal left M1 segment. Attempt to advance the catheter into the M2/MCA posterior division branch over the Colossus 35 wire proved unsuccessful. A phenom 21 microcatheter was then navigated through the zoom 55 catheter and over an Aristotle 14 microguidewire into the distal left M2/MCA posterior division branch. Then, a 4 x 40 mm solitaire stent retriever was deployed spanning the M2 segment. The device was allowed to intercalated with the clot for 4 minutes. The microcatheter was removed. The aspiration catheter was advanced to the level of occlusion and connected to an aspiration pump. The thrombectomy device and aspiration catheter were removed under constant aspiration. Left internal carotid artery angiograms with frontal and lateral views of the head showed complete recanalization of the M2 segment with normal flow in the left MCA vascular tree (TICI 3). Flat panel CT of the head was obtained and post processed in a separate workstation with concurrent attending physician supervision. Selected images were sent to PACS. No evidence of hemorrhagic complication. The catheter was subsequently withdrawn. Right common femoral artery angiogram was  obtained in right anterior oblique view. The puncture is at the level of the common femoral artery. The artery has normal caliber, adequate for closure device. The sheath was exchanged over the wire for a Perclose prostyle which was utilized for access closure. Immediate hemostasis was achieved. IMPRESSION: 1. Successful mechanical thrombectomy for treatment of a mid left M2/MCA posterior division branch occlusion achieving TICI 3 recanalization. 2. No  evidence of thromboembolic or hemorrhagic complication. PLAN: Transfer to ICU for continued post stroke care. Electronically Signed   By: Pedro Earls M.D.   On: 01/03/2022 17:02   IR US Guide Vasc Access Right  Result Date: 01/03/2022 INDICATION: 56 year old female with past medical history significant for coronary artery disease status post 4 vessel CABG, hypertension, hyperlipidemia, type 2 diabetes mellitus, GERD, morbid obesity and hypothyroidism. Patient underwent a diagnostic coronary angiography today and was noted to be aphasic postprocedure; NIHSS 6. Her last known well was 10:30 a.m. on 01/03/2022. Head CT showed no acute intracranial abnormality. CT angiogram of the head and neck showed a mid left M2/MCA posterior division branch occlusion. She was then transferred to our service for a diagnostic cerebral angiogram and mechanical thrombectomy. EXAM: ULTRASOUND-GUIDED VASCULAR ACCESS DIAGNOSTIC CEREBRAL ANGIOGRAM MECHANICAL THROMBECTOMY FLAT PANEL HEAD CT COMPARISON:  CT/CT angiogram head and neck January 03, 2022. MEDICATIONS: Refer to anesthesia documentation. ANESTHESIA/SEDATION: The procedure was performed under general anesthesia. CONTRAST:  50 mL of Omnipaque 300 milligram/mL FLUOROSCOPY: Radiation Exposure Index (as provided by the fluoroscopic device): 831 mGy Kerma COMPLICATIONS: None immediate. TECHNIQUE: Informed written consent was obtained from the patient after a thorough discussion of the procedural risks, benefits and alternatives. All questions were addressed. Maximal Sterile Barrier Technique was utilized including caps, mask, sterile gowns, sterile gloves, sterile drape, hand hygiene and skin antiseptic. A timeout was performed prior to the initiation of the procedure. The right groin was prepped and draped in the usual sterile fashion. Using a micropuncture kit and the modified Seldinger technique, access was gained to the right common femoral artery and an 8 French  sheath was placed. Real-time ultrasound guidance was utilized for vascular access including the acquisition of a permanent ultrasound image documenting patency of the accessed vessel. Under fluoroscopy, a Zoom 88 guide catheter was navigated over a 6 [place] Berenstein 2 catheter and a 0.035" Terumo Glidewire into the aortic arch. The catheter was placed into the left common carotid artery and then advanced into the left internal carotid artery. The diagnostic catheter was removed. Frontal and lateral angiograms of the head were obtained. FINDINGS: 1. Normal caliber of the right common femoral artery, adequate for vascular access. 2. Occlusion of the mid left M2/MCA posterior division branch. PROCEDURE: Using biplane roadmap, a Zoom 55 aspiration catheter was navigated over Colossus 35 microguidewire into the cavernous segment of the left ICA. The aspiration catheter was then advanced to the distal left M1 segment. Attempt to advance the catheter into the M2/MCA posterior division branch over the Colossus 35 wire proved unsuccessful. A phenom 21 microcatheter was then navigated through the zoom 55 catheter and over an Aristotle 14 microguidewire into the distal left M2/MCA posterior division branch. Then, a 4 x 40 mm solitaire stent retriever was deployed spanning the M2 segment. The device was allowed to intercalated with the clot for 4 minutes. The microcatheter was removed. The aspiration catheter was advanced to the level of occlusion and connected to an aspiration pump. The thrombectomy device and aspiration catheter were removed under constant aspiration. Left internal  carotid artery angiograms with frontal and lateral views of the head showed complete recanalization of the M2 segment with normal flow in the left MCA vascular tree (TICI 3). Flat panel CT of the head was obtained and post processed in a separate workstation with concurrent attending physician supervision. Selected images were sent to PACS. No  evidence of hemorrhagic complication. The catheter was subsequently withdrawn. Right common femoral artery angiogram was obtained in right anterior oblique view. The puncture is at the level of the common femoral artery. The artery has normal caliber, adequate for closure device. The sheath was exchanged over the wire for a Perclose prostyle which was utilized for access closure. Immediate hemostasis was achieved. IMPRESSION: 1. Successful mechanical thrombectomy for treatment of a mid left M2/MCA posterior division branch occlusion achieving TICI 3 recanalization. 2. No evidence of thromboembolic or hemorrhagic complication. PLAN: Transfer to ICU for continued post stroke care. Electronically Signed   By: Pedro Earls M.D.   On: 01/03/2022 17:02   IR CT Head Ltd  Result Date: 01/03/2022 INDICATION: 56 year old female with past medical history significant for coronary artery disease status post 4 vessel CABG, hypertension, hyperlipidemia, type 2 diabetes mellitus, GERD, morbid obesity and hypothyroidism. Patient underwent a diagnostic coronary angiography today and was noted to be aphasic postprocedure; NIHSS 6. Her last known well was 10:30 a.m. on 01/03/2022. Head CT showed no acute intracranial abnormality. CT angiogram of the head and neck showed a mid left M2/MCA posterior division branch occlusion. She was then transferred to our service for a diagnostic cerebral angiogram and mechanical thrombectomy. EXAM: ULTRASOUND-GUIDED VASCULAR ACCESS DIAGNOSTIC CEREBRAL ANGIOGRAM MECHANICAL THROMBECTOMY FLAT PANEL HEAD CT COMPARISON:  CT/CT angiogram head and neck January 03, 2022. MEDICATIONS: Refer to anesthesia documentation. ANESTHESIA/SEDATION: The procedure was performed under general anesthesia. CONTRAST:  50 mL of Omnipaque 300 milligram/mL FLUOROSCOPY: Radiation Exposure Index (as provided by the fluoroscopic device): 116 mGy Kerma COMPLICATIONS: None immediate. TECHNIQUE: Informed written  consent was obtained from the patient after a thorough discussion of the procedural risks, benefits and alternatives. All questions were addressed. Maximal Sterile Barrier Technique was utilized including caps, mask, sterile gowns, sterile gloves, sterile drape, hand hygiene and skin antiseptic. A timeout was performed prior to the initiation of the procedure. The right groin was prepped and draped in the usual sterile fashion. Using a micropuncture kit and the modified Seldinger technique, access was gained to the right common femoral artery and an 8 French sheath was placed. Real-time ultrasound guidance was utilized for vascular access including the acquisition of a permanent ultrasound image documenting patency of the accessed vessel. Under fluoroscopy, a Zoom 88 guide catheter was navigated over a 6 [place] Berenstein 2 catheter and a 0.035" Terumo Glidewire into the aortic arch. The catheter was placed into the left common carotid artery and then advanced into the left internal carotid artery. The diagnostic catheter was removed. Frontal and lateral angiograms of the head were obtained. FINDINGS: 1. Normal caliber of the right common femoral artery, adequate for vascular access. 2. Occlusion of the mid left M2/MCA posterior division branch. PROCEDURE: Using biplane roadmap, a Zoom 55 aspiration catheter was navigated over Colossus 35 microguidewire into the cavernous segment of the left ICA. The aspiration catheter was then advanced to the distal left M1 segment. Attempt to advance the catheter into the M2/MCA posterior division branch over the Colossus 35 wire proved unsuccessful. A phenom 21 microcatheter was then navigated through the zoom 55 catheter and over an Aristotle 14  microguidewire into the distal left M2/MCA posterior division branch. Then, a 4 x 40 mm solitaire stent retriever was deployed spanning the M2 segment. The device was allowed to intercalated with the clot for 4 minutes. The  microcatheter was removed. The aspiration catheter was advanced to the level of occlusion and connected to an aspiration pump. The thrombectomy device and aspiration catheter were removed under constant aspiration. Left internal carotid artery angiograms with frontal and lateral views of the head showed complete recanalization of the M2 segment with normal flow in the left MCA vascular tree (TICI 3). Flat panel CT of the head was obtained and post processed in a separate workstation with concurrent attending physician supervision. Selected images were sent to PACS. No evidence of hemorrhagic complication. The catheter was subsequently withdrawn. Right common femoral artery angiogram was obtained in right anterior oblique view. The puncture is at the level of the common femoral artery. The artery has normal caliber, adequate for closure device. The sheath was exchanged over the wire for a Perclose prostyle which was utilized for access closure. Immediate hemostasis was achieved. IMPRESSION: 1. Successful mechanical thrombectomy for treatment of a mid left M2/MCA posterior division branch occlusion achieving TICI 3 recanalization. 2. No evidence of thromboembolic or hemorrhagic complication. PLAN: Transfer to ICU for continued post stroke care. Electronically Signed   By: Pedro Earls M.D.   On: 01/03/2022 17:02   CT ANGIO HEAD NECK W WO CM (CODE STROKE)  Result Date: 01/03/2022 CLINICAL DATA:  Neuro deficit, acute, stroke suspected EXAM: CT ANGIOGRAPHY HEAD AND NECK TECHNIQUE: Multidetector CT imaging of the head and neck was performed using the standard protocol during bolus administration of intravenous contrast. Multiplanar CT image reconstructions and MIPs were obtained to evaluate the vascular anatomy. Carotid stenosis measurements (when applicable) are obtained utilizing NASCET criteria, using the distal internal carotid diameter as the denominator. RADIATION DOSE REDUCTION: This exam was  performed according to the departmental dose-optimization program which includes automated exposure control, adjustment of the mA and/or kV according to patient size and/or use of iterative reconstruction technique. CONTRAST:  79mL OMNIPAQUE IOHEXOL 350 MG/ML SOLN COMPARISON:  None Available. FINDINGS: CTA NECK Aortic arch: Mild plaque. Great vessel origins are patent. Mixed plaque along the proximal left subclavian without high-grade stenosis. Right carotid system: Patent. Mild calcified plaque at the distal common carotid. No stenosis. Left carotid system: Patent.  No stenosis. Vertebral arteries: Patent and codominant.  No stenosis. Skeleton: No significant osseous abnormality. Other neck: Unremarkable. Upper chest: No apical lung mass. Review of the MIP images confirms the above findings CTA HEAD Anterior circulation: Intracranial internal carotid arteries are patent with mild calcified plaque. Left M1 MCA is patent. There is occlusion of a left M2 MCA branch approximally 1.5 cm posterior to the bifurcation. Reconstitution more distally. Right middle cerebral artery is patent. Anterior cerebral arteries are patent. Posterior circulation: Intracranial vertebral arteries, basilar artery, and posterior cerebral arteries are patent. Major cerebellar artery origins are patent. Venous sinuses: Patent as allowed by contrast bolus timing. 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 No hemodynamically significant stenosis in the neck. These results were called by telephone at the time of interpretation on 01/03/2022 at 1:29 pm to provider [facility] , who verbally acknowledged these results. Electronically Signed   By: Macy Mis M.D.   On: 01/03/2022 13:35   CT HEAD CODE STROKE WO CONTRAST  Result Date: 01/03/2022  CLINICAL DATA:  Neuro deficit, acute, stroke suspected EXAM: CT HEAD WITHOUT CONTRAST TECHNIQUE:  Contiguous axial images were obtained from the base of the skull through the vertex without intravenous contrast. RADIATION DOSE REDUCTION: This exam was performed according to the departmental dose-optimization program which includes automated exposure control, adjustment of the mA and/or kV according to patient size and/or use of iterative reconstruction technique. COMPARISON:  None Available. FINDINGS: Intravascular contrast is present presumably from cardiac catheterization. Brain: There is no acute intracranial hemorrhage, mass effect, or edema. Gray-white differentiation is preserved. There is no extra-axial fluid collection. Ventricles and sulci are within normal limits in size and configuration. Vascular: Cannot evaluate for hyperdense vessel due to above. Skull: Calvarium is unremarkable. Sinuses/Orbits: No acute finding. Other: None. IMPRESSION: There is no acute intracranial hemorrhage or evidence of acute infarction. ASPECT score is 10. Residual intravascular contest precludes evaluation for hyperdense vessel. These results were communicated to Dr. Lorrin Goodell at 1:05 pm on 01/03/2022 by text page via the [facility] messaging system. Electronically Signed   By: Macy Mis M.D.   On: 01/03/2022 13:22    PHYSICAL EXAM  Physical Exam  Constitutional: Appears well-developed and well-nourished middle-aged Caucasian lady.   Cardiovascular: Normal rate and regular rhythm.  Respiratory: Effort normal, non-labored breathing  Neuro: Mental Status: Patient is awake, alert, oriented to person, place, month, year. No signs of aphasia or neglect Cranial Nerves: II: Visual Fields are full. Pupils are equal, round, and reactive to light.  III,IV, VI: EOMI without ptosis or diploplia.  V: Facial sensation is symmetric to light touch. VII: Facial movement is symmetric resting and smiling VIII: Hearing is intact to voice X: Palate elevates symmetrically XI: Shoulder shrug is symmetric. XII: Tongue  protrudes midline without atrophy or fasciculations.  Motor: Tone is normal. Bulk is normal. 5/5 strength was present in all four extremities.  Diminished fine finger movements on the right and moderate left or right upper extremity. Sensory: Sensation is symmetric to light touch in the arms and legs.  Cerebellar: FNF and HKS are intact bilaterally.  ASSESSMENT/PLAN [name] is a 56 y.o. female with history of CAD s/p 4vCABG, HTN, HLD, DM2, GERD, morbid obesity, hypothyroidism admitted with ACS and underwent LHC cath without any intervention. 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 Residual intravascular contrast precludes evaluation for hyperdense vessel.   CTA head & neck Mid left M2 MCA branch occlusion with reconstitution more distally. Post IR CT No evidence of hemorrhagic complication. MRI  Small acute infarcts in the left hippocampus, left insula, left posterior frontal lobe, and white matter along the atrium of the left lateral ventricle. No additional acute intracranial process 2D Echo 8/13: LVEF 60-65%. Grade I Diastolic dysfunction LV. Atrial septum grossly normal LDL 92 HgbA1c 7.8 VTE prophylaxis - on heparin drip    Diet   DIET - DYS 1 Room service appropriate? Yes with Assist; Fluid consistency: Thin   No antithrombotic prior to admission (per pt report), now on aspirin 81 mg daily and clopidogrel 75 mg daily.  Therapy recommendations:  No PT f/u Disposition:  Pending  Hypertension Home meds:  metoprolol $RemoveBef'25mg'[id number]$  BID, resumed in hospital Stable Permissive hypertension (OK  if < 220/120) but gradually normalize in 5-7 days Long-term BP  goal normotensive  Hyperlipidemia Home meds: crestor $RemoveBefor'20mg'[id number]$  daily and praluent, crestor resumed in hospital LDL 92, goal < 70 Followed by lipid clinic, could not taken an oral statin per her report and elevated LDL was thought to contribute to risk of recurrent events. She was restarted on crestor and started on praluent by lipid clinic.  Continue statin at discharge  Diabetes type II Uncontrolled Home meds:  on insulin pump HgbA1c 7.8, goal < 7.0 CBGs Recent Labs    01/03/22 1731 01/03/22 2129 01/04/22 0748  GLUCAP 208* 224* 222*   SSI On gabapentin 200 QHS   CAD s/p 4vCABG Followed by cardiology (Dr. Percival Spanish) Last cardiac cath 2014 with patent grafts.  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 Per cardiology, she will need intervention to her circumflex and probably RCA but will defer until she is stable.  Started on ASA+ plavix, discuss long-term DAPT with cardiology  Other Stroke Risk Factors Denies Cigarette smoker No ETOH use per chart review Obesity, Body mass index is 39.43 kg/m., BMI >/= 30 associated with increased stroke risk, recommend weight loss, diet and exercise as appropriate  Denies Hx stroke/TIA No Family hx stroke per chart review  Other Active Problems GERD Hypothyroidism  Hospital day # 4  Rolanda Lundborg, MD, PGY-1  STROKE MD NOTE : I have personally obtained history,examined this patient, reviewed notes, independently viewed imaging studies, participated in medical decision making and plan of care.ROS completed by me personally and pertinent positives fully documented  I have made any additions or clarifications directly to the above note. Agree with note above.  Patient came in for elective cardiac catheterization and during the procedure developed sudden onset of aphasia due to left M2 occlusion  for which she underwent emergent mechanical thrombectomy successfully with excellent recanalization.  MRI scan shows patchy left MCA small infarcts clinically which she is recovered fully with no residual aphasia.  Continue close neurological observation and strict blood pressure control as per post thrombectomy protocol.  Continue mobilization out of bed and therapy consults.  Dual antiplatelet therapy of aspirin and Plavix.  Aggressive risk factor modification.  Long discussion with patient and answered questions.This patient is critically ill and at significant risk of neurological worsening, death and care requires constant monitoring of vital signs, hemodynamics,respiratory and cardiac monitoring, extensive review of multiple databases, frequent neurological assessment, discussion with family, other specialists and medical decision making of high complexity.I have made any additions or clarifications directly to the above note.This critical care time does not reflect procedure time, or teaching time or supervisory time of PA/NP/Med Resident etc but could involve care discussion time.  I spent 30 minutes of neurocritical care time  in the care of  this patient.      Antony Contras, MD Medical Director [facility] Pager: [phone] 01/04/2022 2:56 PM  To contact Stroke Continuity provider, please refer to [email or web address] After hours, contact General Neurology

## 2022-01-04 NOTE — Progress Notes (Signed)
ANTICOAGULATION CONSULT NOTE - Follow Up  Pharmacy Consult for Heparin  Indication: chest pain/ACS  Allergies  Allergen Reactions   Metformin Anaphylaxis   Penicillins Anaphylaxis and Other (See Comments)    Has patient had a PCN reaction causing immediate rash, facial/tongue/throat swelling, SOB or lightheadedness with hypotension: Yes Has patient had a PCN reaction causing severe rash involving mucus membranes or skin necrosis: No Has patient had a PCN reaction that required hospitalization No Has patient had a PCN reaction occurring within the last 10 years: No If all of the above answers are "NO", then may proceed with Cephalosporin use.   Metoclopramide Other (See Comments)    Reaction:  Nightmares     Patient Measurements: Height: 5\' 4"  (162.6 cm) Weight: 104.2 kg (229 lb 11.5 oz) IBW/kg (Calculated) : 54.7  Vital Signs: Temp: 98 F (36.7 C) (08/16 0000) Temp Source: Oral (08/16 0000) BP: 116/48 (08/16 0100) Pulse Rate: 67 (08/16 0100)  Labs: Recent Labs    01/01/22 0947 01/01/22 1501 01/02/22 0658 01/03/22 0318 01/03/22 1601 01/04/22 0247  HGB  --   --   --   --  15.5* 13.9  HCT  --   --   --   --  48.5* 41.9  PLT  --   --   --   --  318 330  APTT  --   --   --   --  26  --   HEPARINUNFRC 0.34  --    < > 0.40 <0.10* 0.19*  CREATININE  --   --   --   --  0.72 0.80  TROPONINIHS 116* 137*  --   --   --   --    < > = values in this interval not displayed.     Estimated Creatinine Clearance: 94.5 mL/min (by C-G formula based on SCr of 0.8 mg/dL).   Medical History: Past Medical History:  Diagnosis Date   Anxiety    Asthma    Blood transfusion without reported diagnosis    2010 CABG   CAD (coronary artery disease)    4v CABG, 9/10; NL LVF, status post followup Cardiolite November 2012 no ischemia ejection fraction 65%   Cataract    are forming   Diabetes mellitus    Dyslipidemia     LDL 22 mg percent on Crestor   Esophageal stricture    Fatty liver     Gastroparesis due to DM (HCC)    GERD (gastroesophageal reflux disease)    Glaucoma    Hyperlipidemia    Hypertension    Hypothyroidism    Knee injury, right, initial encounter    Left-sided chest wall pain    Myocardial infarction (HCC) 2010   2008 x 2 stents, 2009 x 2 stents. 01-2009 CABG x 4      Assessment: 56 y/o F with cardiac history in the ED with "burning" in chest, mildly elevated trop. Pharmacy to dose heparin for ACS.   Pt s/p LHC today c/b code stroke. Pt s/p IR for mechanical thrombectomy. Per cardiology, resume heparin with no bolus 6h after thrombectomy. Will use low goal.  8/16 AM update:  Heparin level low  Goal of Therapy:  Heparin level 0.3-0.5 units/ml Monitor platelets by anticoagulation protocol: Yes   Plan:  -No boluses  -Inc heparin to 1700 units/hr -1200 heparin level  58, PharmD, BCPS Clinical Pharmacist Phone: [phone]

## 2022-01-04 NOTE — Progress Notes (Addendum)
ANTICOAGULATION CONSULT NOTE - Follow Up  Pharmacy Consult for Heparin  Indication: chest pain/ACS  Allergies  Allergen Reactions   Metformin Anaphylaxis   Penicillins Anaphylaxis and Other (See Comments)    Has patient had a PCN reaction causing immediate rash, facial/tongue/throat swelling, SOB or lightheadedness with hypotension: Yes Has patient had a PCN reaction causing severe rash involving mucus membranes or skin necrosis: No Has patient had a PCN reaction that required hospitalization No Has patient had a PCN reaction occurring within the last 10 years: No If all of the above answers are "NO", then may proceed with Cephalosporin use.   Metoclopramide Other (See Comments)    Reaction:  Nightmares     Patient Measurements: Height: 5\' 4"  (162.6 cm) Weight: 104.2 kg (229 lb 11.5 oz) IBW/kg (Calculated) : 54.7  Vital Signs: Temp: 97.5 F (36.4 C) (08/16 0800) Temp Source: Oral (08/16 0800) BP: 127/60 (08/16 1000) Pulse Rate: 78 (08/16 1000)  Labs: Recent Labs    01/01/22 1501 01/02/22 0658 01/03/22 1601 01/04/22 0247 01/04/22 1157  HGB  --   --  15.5* 13.9  --   HCT  --   --  48.5* 41.9  --   PLT  --   --  318 330  --   APTT  --   --  26  --   --   HEPARINUNFRC  --    < > <0.10* 0.19* 0.50  CREATININE  --   --  0.72 0.80  --   TROPONINIHS 137*  --   --   --   --    < > = values in this interval not displayed.     Estimated Creatinine Clearance: 94.5 mL/min (by C-G formula based on SCr of 0.8 mg/dL).   Medical History: Past Medical History:  Diagnosis Date   Anxiety    Asthma    Blood transfusion without reported diagnosis    2010 CABG   CAD (coronary artery disease)    4v CABG, 9/10; NL LVF, status post followup Cardiolite November 2012 no ischemia ejection fraction 65%   Cataract    are forming   Diabetes mellitus    Dyslipidemia     LDL 22 mg percent on Crestor   Esophageal stricture    Fatty liver    Gastroparesis due to DM (HCC)    GERD  (gastroesophageal reflux disease)    Glaucoma    Hyperlipidemia    Hypertension    Hypothyroidism    Knee injury, right, initial encounter    Left-sided chest wall pain    Myocardial infarction (HCC) 2010   2008 x 2 stents, 2009 x 2 stents. 01-2009 CABG x 4      Assessment: 56 y/o F with cardiac history in the ED with "burning" in chest, mildly elevated trop. Pharmacy to dose heparin for ACS.   Pt s/p LHC on 8/15 c/b code stroke. Pt s/p IR for mechanical thrombectomy. Per cardiology, resume heparin with no bolus 6h after thrombectomy. Will use low goal. Patient's most recent level is at the upper end of therapeutic at 0.5 while on 1700u/hr. Previous level was subtherapeutic with anti Xa of 0.19 while on 1550 units/hr.   8/16 AM update:  Heparin level low  Goal of Therapy:  Heparin level 0.3-0.5 units/ml Monitor platelets by anticoagulation protocol: Yes   Plan:  -No boluses  -Will decrease rate to 1650u/hr to hopefully target lower end of target range.  -19:00 heparin level   9/15,  PharmD

## 2022-01-04 NOTE — Progress Notes (Signed)
Referring Physician(s): Donnetta Simpers, MD   Supervising Physician: Pedro Earls  Patient Status:  [facility] - In-pt  Chief Complaint:  Code Stroke  Brief History:  [name] is a 56 y.o. female with medical issues including CAD s/p 4 v CABG, HTN, HLD, DM2, GERD, obesity, and hypothyroidism.  She was admitted with Acute coronary syndrom.  She underwent a cardiac catheterization yesterday. No intervention done.   Post procedure, she was noted to be aphasic and a stroke code was activated.   LKW: 1030 on 01/03/22 TNK was not offered given she was on heparin drip.  She underwent cerebral intervention by Dr. Karenann Cai  Her note reads= DIAGNOSTIC CEREBRAL ANGIOGRAM AND MECHANICAL THROMBECTOMY   Attending: Dr. Pedro Earls   Diagnosis: Left M2/MCA occlusion   Access site: RCFA   Access closure: Perclose Prostyle   Anesthesia: GETA   Medication used: refer to anesthesia documentation.   Complications: None.   Estimated blood loss: 30 mL.   Specimen: None.   Findings: Mid left M2/MCA occlusion. Mechanical thrombectomy performed with combine stent retriever and direct contact aspiration with complete recanalization (TICI3). No hemorrhagic or thromboembolic complication.    Subjective:  Doing much better this morning. Up walking in room.  Allergies: Metformin, Penicillins, and Metoclopramide  Medications: Prior to Admission medications   Medication Sig Start Date End Date Taking? Authorizing Provider  albuterol (VENTOLIN HFA) 108 (90 Base) MCG/ACT inhaler Inhale 2 puffs into the lungs every 6 (six) hours as needed for wheezing or shortness of breath. 04/21/19  Yes Oswald Hillock, MD  ALPRAZolam Duanne Moron) 0.25 MG tablet Take 0.25 mg by mouth 2 (two) times daily as needed for anxiety or sleep.  11/21/17  Yes [provider]  ammonium lactate (AMLACTIN) 12 % lotion Apply 1 application topically as needed for dry  skin. 12/10/20  Yes Felipa Furnace, DPM  atenolol (TENORMIN) 25 MG tablet Take 1 tablet (25 mg total) by mouth daily. 11/30/21  Yes Minus Breeding, MD  Cholecalciferol (VITAMIN D3) 125 MCG (5000 UT) CAPS Take 1 tablet by mouth daily.   Yes [provider]  ciclopirox (PENLAC) 8 % solution APPLY TOPICALLY AT BEDTIME. APPLY OVER NAIL AND SURROUNDING SKIN. APPLY DAILY OVER PREVIOUS COAT. AFTER SEVEN (7) DAYS, MAY REMOVE WITH ALCOHOL AND CONTINUE CYCLE. 12/10/20  Yes Felipa Furnace, DPM  cyclobenzaprine (FLEXERIL) 10 MG tablet Take 10 mg by mouth 3 (three) times daily. 08/16/21  Yes [provider]  Fluticasone-Umeclidin-Vilant (TRELEGY ELLIPTA) 100-62.5-25 MCG/INH AEPB Inhale into the lungs daily.   Yes [provider]  ibuprofen (ADVIL) 800 MG tablet TAKE 1 TABLET BY MOUTH EVERY 8 HOURS AS NEEDED Patient taking differently: Take 400 mg by mouth daily as needed for moderate pain. 06/21/21  Yes Carole Civil, MD  Insulin Human (INSULIN PUMP) SOLN Inject into the skin See admin instructions. Pt uses up to 150 units of NOVOLOG daily VIA PUMP   Yes [provider]  lansoprazole (PREVACID SOLUTAB) 30 MG disintegrating tablet Take 1 tablet (30 mg total) by mouth daily. 08/22/21  Yes Esterwood, Amy S, PA-C  levothyroxine (SYNTHROID) 137 MCG tablet Take 137 mcg by mouth daily before breakfast.   Yes [provider]  liraglutide (VICTOZA) 18 MG/3ML SOPN Inject 1.8 mg into the skin daily.   Yes [provider]  NOVOLOG 100 UNIT/ML injection Inject 100-150 Units into the skin as directed. Use up to 100-150 units daily in insulin pump  as needed 11/23/17  Yes [provider]  oxyCODONE-acetaminophen (PERCOCET/ROXICET) 5-325 MG tablet Take 1 tablet by mouth 3 (three) times daily as needed.   Yes [provider]  prednisoLONE (PRELONE) 15 MG/5ML SOLN Take 15 mg by mouth 2 (two) times daily. 11/02/20  Yes [provider]   Pseudoeph-Doxylamine-DM-APAP (NYQUIL PO) Take 30 mLs by mouth at bedtime as needed (for cough).   Yes [provider]  rosuvastatin (CRESTOR) 20 MG tablet Take 1 tablet (20 mg total) by mouth daily. 08/23/21 12/31/21 Yes Hochrein, Jeneen Rinks, MD  triamcinolone ointment (KENALOG) 0.1 % Apply 1 application topically daily as needed. 11/02/20  Yes [provider]  Alirocumab (PRALUENT) 150 MG/ML SOAJ Inject 150 mg into the skin every 14 (fourteen) days. 08/23/21   Minus Breeding, MD  furosemide (LASIX) 40 MG tablet TAKE 1 TABLET BY MOUTH EVERY DAY Patient not taking: Reported on 12/31/2021 11/17/19   Herminio Commons, MD  Magnesium Oxide 500 MG CAPS Take 1 capsule by mouth every morning. Patient not taking: Reported on 12/31/2021 04/07/19   [provider]  Misc. Devices (CANE) MISC 1 standard cane for standing and ambulation.  Diagnosis right knee pain. 05/18/21   Triplett, Tammy, PA-C  nitroGLYCERIN (NITROSTAT) 0.4 MG SL tablet Place 1 tablet (0.4 mg total) under the tongue every 5 (five) minutes x 3 doses as needed for chest pain (if no relief after 2nd dose, proceed to the ED for an evaluation or call 911). Patient not taking: Reported on 12/31/2021 04/23/20   Minus Breeding, MD     Vital Signs: BP 127/60   Pulse 78   Temp (!) 97.5 F (36.4 C) (Oral)   Resp 16   Ht _0  (1.626 m)   Wt 229 lb 11.5 oz (104.2 kg)   LMP 09/22/2019   SpO2 96%   BMI 39.43 kg/m   Physical Exam Vitals reviewed.  Constitutional:      Appearance: Normal appearance.  Cardiovascular:     Rate and Rhythm: Normal rate.  Pulmonary:     Effort: Pulmonary effort is normal. No respiratory distress.  Abdominal:     Palpations: Abdomen is soft.  Neurological:     Mental Status: She is alert and oriented to person, place, and time.     Comments: Alert, awake, and oriented x3. Speech and comprehension intact. EOMs intact bilaterally Right facial droop Tongue midline. Motor power symmetric  proportional to effort. 5/5 Strength bilaterally Common femoral artery puncture site looks good, no bleeding, no hematoma, no pseudoaneurysm    Psychiatric:        Mood and Affect: Mood normal.        Behavior: Behavior normal.        Thought Content: Thought content normal.        Judgment: Judgment normal.     Imaging: MR BRAIN WO CONTRAST  Result Date: 01/04/2022 CLINICAL DATA:  Status post thrombectomy. EXAM: MRI HEAD WITHOUT CONTRAST TECHNIQUE: Multiplanar, multiecho pulse sequences of the brain and surrounding structures were obtained without intravenous contrast. COMPARISON:  CT and CTA 01/03/2022 FINDINGS: Brain: Linear restricted diffusion with ADC correlate extending from the white matter along the atrium of the left lateral ventricle, extending into the posterior left hippocampus (series 2, images 23-30) as well as along the left insula (series 2, images 23-25), with additional smaller foci of restricted diffusion in the anterior left hippocampus (series 2, image 22) and posterior left frontal lobe (series 2, image 42). No acute hemorrhage, mass mass  effect, or midline shift. No hydrocephalus or extra-axial collection. No hemosiderin deposition to suggest remote hemorrhage. Vascular: Normal arterial flow voids. Loss of the left sigmoid and jugular flow void is favored to represent slow flow, given contrast opacification on 01/03/2022 CTA. Skull and upper cervical spine: Normal marrow signal. Sinuses/Orbits: No acute finding. Other: Trace fluid in the mastoid air cells. 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 No additional acute intracranial process. Electronically Signed   By: Merilyn Baba M.D.   On: 01/04/2022 02:05   CARDIAC CATHETERIZATION  Result Date: 01/03/2022   Prox RCA lesion is 30% stenosed.   Mid RCA lesion is 90% stenosed.   RPAV lesion is 90%  stenosed.   Dist RCA lesion is 65% stenosed.   Mid Cx lesion is 90% stenosed.   Dist Cx lesion is 95% stenosed.   Ost Cx to Prox Cx lesion is 80% stenosed.   1st Diag lesion is 100% stenosed.   Prox LAD to Mid LAD lesion is 100% stenosed. Severe multivessel native CAD with total occlusion of the previously placed proximal LAD stent; proximal 80% circumflex stenosis immediately before the takeoff of the OM1 vessel with progressive 90 and 95% mid and mid distal stenoses in the circumflex; and large dominant RCA with 30% proximal stenosis, thrombotic stenosis of 90% in the region of the acute margin followed by diffuse irregularity beyond this without visualization of the PDA from the native injection but with 90% distal stenosis proximal to the PDA vessel. Patent LIMA graft supplying the mid LAD. Patent SVG supplying the first diagonal vessel. Patent SVG supplying the OM1 vessel.  However, there are 90 and 95% stenosis in the AV groove circumflex beyond the OM1 graft. Patent SVG supplying the ostium of the PDA of the RCA. LVEDP 10 mmHg. RECOMMENDATION: Post procedure, the patient developed some aphasia and short-term babbled speech which improved fairly quickly.  A code stroke was activated.  CT of the head was negative for any large hypodensity abnormality.  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  She underwent successful mechanical thrombectomy with stent retriever and direct contact aspiration with complete recanalization by interventional radiology.  There were no hemorrhagic or thromboembolic complications and she had normalization of symptoms.  Will need to defer initial plan for PCI until timing is appropriate.   IR PERCUTANEOUS ART THROMBECTOMY/INFUSION INTRACRANIAL INC DIAG ANGIO  Result Date: 01/03/2022 INDICATION: 56 year old female with past medical history significant for coronary artery disease status post 4 vessel CABG,  hypertension, hyperlipidemia, type 2 diabetes mellitus, GERD, morbid obesity and hypothyroidism. Patient underwent a diagnostic coronary angiography today and was noted to be aphasic postprocedure; NIHSS 6. Her last known well was 10:30 a.m. on 01/03/2022. Head CT showed no acute intracranial abnormality. CT angiogram of the head and neck showed a mid left M2/MCA posterior division branch occlusion. She was then transferred to our service for a diagnostic cerebral angiogram and mechanical thrombectomy. EXAM: ULTRASOUND-GUIDED VASCULAR ACCESS DIAGNOSTIC CEREBRAL ANGIOGRAM MECHANICAL THROMBECTOMY FLAT PANEL HEAD CT COMPARISON:  CT/CT angiogram head and neck January 03, 2022. MEDICATIONS: Refer to anesthesia documentation. ANESTHESIA/SEDATION: The procedure was performed under general anesthesia. CONTRAST:  50 mL of Omnipaque 300 milligram/mL FLUOROSCOPY: Radiation Exposure Index (as provided by the fluoroscopic device): 413 mGy Kerma COMPLICATIONS: None immediate. TECHNIQUE: Informed written consent was obtained from the patient  after a thorough discussion of the procedural risks, benefits and alternatives. All questions were addressed. Maximal Sterile Barrier Technique was utilized including caps, mask, sterile gowns, sterile gloves, sterile drape, hand hygiene and skin antiseptic. A timeout was performed prior to the initiation of the procedure. The right groin was prepped and draped in the usual sterile fashion. Using a micropuncture kit and the modified Seldinger technique, access was gained to the right common femoral artery and an 8 French sheath was placed. Real-time ultrasound guidance was utilized for vascular access including the acquisition of a permanent ultrasound image documenting patency of the accessed vessel. Under fluoroscopy, a Zoom 88 guide catheter was navigated over a 6 [place] Berenstein 2 catheter and a 0.035" Terumo Glidewire into the aortic arch. The catheter was placed into the left common  carotid artery and then advanced into the left internal carotid artery. The diagnostic catheter was removed. Frontal and lateral angiograms of the head were obtained. FINDINGS: 1. Normal caliber of the right common femoral artery, adequate for vascular access. 2. Occlusion of the mid left M2/MCA posterior division branch. PROCEDURE: Using biplane roadmap, a Zoom 55 aspiration catheter was navigated over Colossus 35 microguidewire into the cavernous segment of the left ICA. The aspiration catheter was then advanced to the distal left M1 segment. Attempt to advance the catheter into the M2/MCA posterior division branch over the Colossus 35 wire proved unsuccessful. A phenom 21 microcatheter was then navigated through the zoom 55 catheter and over an Aristotle 14 microguidewire into the distal left M2/MCA posterior division branch. Then, a 4 x 40 mm solitaire stent retriever was deployed spanning the M2 segment. The device was allowed to intercalated with the clot for 4 minutes. The microcatheter was removed. The aspiration catheter was advanced to the level of occlusion and connected to an aspiration pump. The thrombectomy device and aspiration catheter were removed under constant aspiration. Left internal carotid artery angiograms with frontal and lateral views of the head showed complete recanalization of the M2 segment with normal flow in the left MCA vascular tree (TICI 3). Flat panel CT of the head was obtained and post processed in a separate workstation with concurrent attending physician supervision. Selected images were sent to PACS. No evidence of hemorrhagic complication. The catheter was subsequently withdrawn. Right common femoral artery angiogram was obtained in right anterior oblique view. The puncture is at the level of the common femoral artery. The artery has normal caliber, adequate for closure device. The sheath was exchanged over the wire for a Perclose prostyle which was utilized for access  closure. Immediate hemostasis was achieved. IMPRESSION: 1. Successful mechanical thrombectomy for treatment of a mid left M2/MCA posterior division branch occlusion achieving TICI 3 recanalization. 2. No evidence of thromboembolic or hemorrhagic complication. PLAN: Transfer to ICU for continued post stroke care. Electronically Signed   By: Pedro Earls M.D.   On: 01/03/2022 17:02   IR US Guide Vasc Access Right  Result Date: 01/03/2022 INDICATION: 56 year old female with past medical history significant for coronary artery disease status post 4 vessel CABG, hypertension, hyperlipidemia, type 2 diabetes mellitus, GERD, morbid obesity and hypothyroidism. Patient underwent a diagnostic coronary angiography today and was noted to be aphasic postprocedure; NIHSS 6. Her last known well was 10:30 a.m. on 01/03/2022. Head CT showed no acute intracranial abnormality. CT angiogram of the head and neck showed a mid left M2/MCA posterior division branch occlusion. She was then transferred to our service for a diagnostic cerebral angiogram and  mechanical thrombectomy. EXAM: ULTRASOUND-GUIDED VASCULAR ACCESS DIAGNOSTIC CEREBRAL ANGIOGRAM MECHANICAL THROMBECTOMY FLAT PANEL HEAD CT COMPARISON:  CT/CT angiogram head and neck January 03, 2022. MEDICATIONS: Refer to anesthesia documentation. ANESTHESIA/SEDATION: The procedure was performed under general anesthesia. CONTRAST:  50 mL of Omnipaque 300 milligram/mL FLUOROSCOPY: Radiation Exposure Index (as provided by the fluoroscopic device): 742 mGy Kerma COMPLICATIONS: None immediate. TECHNIQUE: Informed written consent was obtained from the patient after a thorough discussion of the procedural risks, benefits and alternatives. All questions were addressed. Maximal Sterile Barrier Technique was utilized including caps, mask, sterile gowns, sterile gloves, sterile drape, hand hygiene and skin antiseptic. A timeout was performed prior to the initiation of the  procedure. The right groin was prepped and draped in the usual sterile fashion. Using a micropuncture kit and the modified Seldinger technique, access was gained to the right common femoral artery and an 8 French sheath was placed. Real-time ultrasound guidance was utilized for vascular access including the acquisition of a permanent ultrasound image documenting patency of the accessed vessel. Under fluoroscopy, a Zoom 88 guide catheter was navigated over a 6 [place] Berenstein 2 catheter and a 0.035" Terumo Glidewire into the aortic arch. The catheter was placed into the left common carotid artery and then advanced into the left internal carotid artery. The diagnostic catheter was removed. Frontal and lateral angiograms of the head were obtained. FINDINGS: 1. Normal caliber of the right common femoral artery, adequate for vascular access. 2. Occlusion of the mid left M2/MCA posterior division branch. PROCEDURE: Using biplane roadmap, a Zoom 55 aspiration catheter was navigated over Colossus 35 microguidewire into the cavernous segment of the left ICA. The aspiration catheter was then advanced to the distal left M1 segment. Attempt to advance the catheter into the M2/MCA posterior division branch over the Colossus 35 wire proved unsuccessful. A phenom 21 microcatheter was then navigated through the zoom 55 catheter and over an Aristotle 14 microguidewire into the distal left M2/MCA posterior division branch. Then, a 4 x 40 mm solitaire stent retriever was deployed spanning the M2 segment. The device was allowed to intercalated with the clot for 4 minutes. The microcatheter was removed. The aspiration catheter was advanced to the level of occlusion and connected to an aspiration pump. The thrombectomy device and aspiration catheter were removed under constant aspiration. Left internal carotid artery angiograms with frontal and lateral views of the head showed complete recanalization of the M2 segment with normal flow  in the left MCA vascular tree (TICI 3). Flat panel CT of the head was obtained and post processed in a separate workstation with concurrent attending physician supervision. Selected images were sent to PACS. No evidence of hemorrhagic complication. The catheter was subsequently withdrawn. Right common femoral artery angiogram was obtained in right anterior oblique view. The puncture is at the level of the common femoral artery. The artery has normal caliber, adequate for closure device. The sheath was exchanged over the wire for a Perclose prostyle which was utilized for access closure. Immediate hemostasis was achieved. IMPRESSION: 1. Successful mechanical thrombectomy for treatment of a mid left M2/MCA posterior division branch occlusion achieving TICI 3 recanalization. 2. No evidence of thromboembolic or hemorrhagic complication. PLAN: Transfer to ICU for continued post stroke care. Electronically Signed   By: Pedro Earls M.D.   On: 01/03/2022 17:02   IR CT Head Ltd  Result Date: 01/03/2022 INDICATION: 56 year old female with past medical history significant for coronary artery disease status post 4 vessel CABG, hypertension, hyperlipidemia, type  2 diabetes mellitus, GERD, morbid obesity and hypothyroidism. Patient underwent a diagnostic coronary angiography today and was noted to be aphasic postprocedure; NIHSS 6. Her last known well was 10:30 a.m. on 01/03/2022. Head CT showed no acute intracranial abnormality. CT angiogram of the head and neck showed a mid left M2/MCA posterior division branch occlusion. She was then transferred to our service for a diagnostic cerebral angiogram and mechanical thrombectomy. EXAM: ULTRASOUND-GUIDED VASCULAR ACCESS DIAGNOSTIC CEREBRAL ANGIOGRAM MECHANICAL THROMBECTOMY FLAT PANEL HEAD CT COMPARISON:  CT/CT angiogram head and neck January 03, 2022. MEDICATIONS: Refer to anesthesia documentation. ANESTHESIA/SEDATION: The procedure was performed under general  anesthesia. CONTRAST:  50 mL of Omnipaque 300 milligram/mL FLUOROSCOPY: Radiation Exposure Index (as provided by the fluoroscopic device): 287 mGy Kerma COMPLICATIONS: None immediate. TECHNIQUE: Informed written consent was obtained from the patient after a thorough discussion of the procedural risks, benefits and alternatives. All questions were addressed. Maximal Sterile Barrier Technique was utilized including caps, mask, sterile gowns, sterile gloves, sterile drape, hand hygiene and skin antiseptic. A timeout was performed prior to the initiation of the procedure. The right groin was prepped and draped in the usual sterile fashion. Using a micropuncture kit and the modified Seldinger technique, access was gained to the right common femoral artery and an 8 French sheath was placed. Real-time ultrasound guidance was utilized for vascular access including the acquisition of a permanent ultrasound image documenting patency of the accessed vessel. Under fluoroscopy, a Zoom 88 guide catheter was navigated over a 6 [place] Berenstein 2 catheter and a 0.035" Terumo Glidewire into the aortic arch. The catheter was placed into the left common carotid artery and then advanced into the left internal carotid artery. The diagnostic catheter was removed. Frontal and lateral angiograms of the head were obtained. FINDINGS: 1. Normal caliber of the right common femoral artery, adequate for vascular access. 2. Occlusion of the mid left M2/MCA posterior division branch. PROCEDURE: Using biplane roadmap, a Zoom 55 aspiration catheter was navigated over Colossus 35 microguidewire into the cavernous segment of the left ICA. The aspiration catheter was then advanced to the distal left M1 segment. Attempt to advance the catheter into the M2/MCA posterior division branch over the Colossus 35 wire proved unsuccessful. A phenom 21 microcatheter was then navigated through the zoom 55 catheter and over an Aristotle 14 microguidewire into the  distal left M2/MCA posterior division branch. Then, a 4 x 40 mm solitaire stent retriever was deployed spanning the M2 segment. The device was allowed to intercalated with the clot for 4 minutes. The microcatheter was removed. The aspiration catheter was advanced to the level of occlusion and connected to an aspiration pump. The thrombectomy device and aspiration catheter were removed under constant aspiration. Left internal carotid artery angiograms with frontal and lateral views of the head showed complete recanalization of the M2 segment with normal flow in the left MCA vascular tree (TICI 3). Flat panel CT of the head was obtained and post processed in a separate workstation with concurrent attending physician supervision. Selected images were sent to PACS. No evidence of hemorrhagic complication. The catheter was subsequently withdrawn. Right common femoral artery angiogram was obtained in right anterior oblique view. The puncture is at the level of the common femoral artery. The artery has normal caliber, adequate for closure device. The sheath was exchanged over the wire for a Perclose prostyle which was utilized for access closure. Immediate hemostasis was achieved. IMPRESSION: 1. Successful mechanical thrombectomy for treatment of a mid left M2/MCA posterior division  branch occlusion achieving TICI 3 recanalization. 2. No evidence of thromboembolic or hemorrhagic complication. PLAN: Transfer to ICU for continued post stroke care. Electronically Signed   By: Pedro Earls M.D.   On: 01/03/2022 17:02   CT ANGIO HEAD NECK W WO CM (CODE STROKE)  Result Date: 01/03/2022 CLINICAL DATA:  Neuro deficit, acute, stroke suspected EXAM: CT ANGIOGRAPHY HEAD AND NECK TECHNIQUE: Multidetector CT imaging of the head and neck was performed using the standard protocol during bolus administration of intravenous contrast. Multiplanar CT image reconstructions and MIPs were obtained to evaluate the vascular  anatomy. Carotid stenosis measurements (when applicable) are obtained utilizing NASCET criteria, using the distal internal carotid diameter as the denominator. RADIATION DOSE REDUCTION: This exam was performed according to the departmental dose-optimization program which includes automated exposure control, adjustment of the mA and/or kV according to patient size and/or use of iterative reconstruction technique. CONTRAST:  74m OMNIPAQUE IOHEXOL 350 MG/ML SOLN COMPARISON:  None Available. FINDINGS: CTA NECK Aortic arch: Mild plaque. Great vessel origins are patent. Mixed plaque along the proximal left subclavian without high-grade stenosis. Right carotid system: Patent. Mild calcified plaque at the distal common carotid. No stenosis. Left carotid system: Patent.  No stenosis. Vertebral arteries: Patent and codominant.  No stenosis. Skeleton: No significant osseous abnormality. Other neck: Unremarkable. Upper chest: No apical lung mass. Review of the MIP images confirms the above findings CTA HEAD Anterior circulation: Intracranial internal carotid arteries are patent with mild calcified plaque. Left M1 MCA is patent. There is occlusion of a left M2 MCA branch approximally 1.5 cm posterior to the bifurcation. Reconstitution more distally. Right middle cerebral artery is patent. Anterior cerebral arteries are patent. Posterior circulation: Intracranial vertebral arteries, basilar artery, and posterior cerebral arteries are patent. Major cerebellar artery origins are patent. Venous sinuses: Patent as allowed by contrast bolus timing. 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 No hemodynamically significant stenosis in the neck. These results were called by telephone at the time of interpretation on 01/03/2022 at 1:29 pm [facility] who verbally acknowledged these results. Electronically Signed   By: PMacy MisM.D.   On: 01/03/2022 13:35   CT HEAD CODE STROKE WO CONTRAST  Result Date: 01/03/2022 CLINICAL DATA:  Neuro deficit, acute, stroke suspected EXAM: CT HEAD WITHOUT CONTRAST TECHNIQUE: Contiguous axial images were obtained from the base of the skull through the vertex without intravenous contrast. RADIATION DOSE REDUCTION: This exam was performed according to the departmental dose-optimization program which includes automated exposure control, adjustment of the mA and/or kV according to patient size and/or use of iterative reconstruction technique. COMPARISON:  None Available. FINDINGS: Intravascular contrast is present presumably from cardiac catheterization. Brain: There is no acute intracranial hemorrhage, mass effect, or edema. Gray-white differentiation is preserved. There is no extra-axial fluid collection. Ventricles and sulci are within normal limits in size and configuration. Vascular: Cannot evaluate for hyperdense vessel due to above. Skull: Calvarium is unremarkable. Sinuses/Orbits: No acute finding. Other: None. IMPRESSION: There is no acute intracranial hemorrhage or evidence of acute infarction. ASPECT score is 10. Residual intravascular contest precludes evaluation for hyperdense vessel. These results were communicated to Dr. KLorrin Goodellat 1:05 pm on 01/03/2022 by text page via the [facility] system. Electronically Signed   By: PMacy MisM.D.   On: 01/03/2022 13:22   ECHOCARDIOGRAM COMPLETE  Result Date: 01/01/2022    ECHOCARDIOGRAM REPORT  Patient Name:   KENDRIA HALBERG Date of Exam: 01/01/2022 Medical Rec #:  [id number]        Height:       64.0 in Accession #:    [id number]       Weight:       234.8 lb Date of Birth:  [birth year]       BSA:          2.094 m Patient Age:    49 years         BP:           131/72 mmHg Patient Gender: F                HR:           77 bpm. Exam Location:  Inpatient Procedure: 2D Echo, Cardiac Doppler, Color Doppler and Intracardiac             Opacification Agent Indications:    Chest pain  History:        Patient has prior history of Echocardiogram examinations, most                 recent 08/15/2017. CHF, Prior CABG; Signs/Symptoms:Edema.  Sonographer:    Merrie Roof RDCS Referring Phys: [place]  1. Left ventricular ejection fraction, by estimation, is 60 to 65%. The left ventricle has normal function. The left ventricle has no regional wall motion abnormalities. There is mild concentric left ventricular hypertrophy. Left ventricular diastolic parameters are consistent with Grade I diastolic dysfunction (impaired relaxation).  2. Right ventricular systolic function was not well visualized. The right ventricular size is normal.  3. The mitral valve is normal in structure. Mild mitral valve regurgitation. No evidence of mitral stenosis.  4. The aortic valve is tricuspid. Aortic valve regurgitation is not visualized. Aortic valve sclerosis is present, with no evidence of aortic valve stenosis.  5. The inferior vena cava is normal in size with greater than 50% respiratory variability, suggesting right atrial pressure of 3 mmHg. Comparison(s): Compared to prior echo report in 2019, there is no signfiicant change. FINDINGS  Left Ventricle: Left ventricular ejection fraction, by estimation, is 60 to 65%. The left ventricle has normal function. The left ventricle has no regional wall motion abnormalities. The left ventricular internal cavity size was normal in size. There is  mild concentric left ventricular hypertrophy. Left ventricular diastolic parameters are consistent with Grade I diastolic dysfunction (impaired relaxation). Right Ventricle: The right ventricular size is normal. No increase in right ventricular wall thickness. Right ventricular systolic function was not well visualized. Left Atrium: Left atrial size was normal in size. Right Atrium: Right atrial size was normal in size. Pericardium: There is no evidence of  pericardial effusion. Mitral Valve: The mitral valve is normal in structure. There is mild thickening of the mitral valve leaflet(s). Mild mitral valve regurgitation. No evidence of mitral valve stenosis. Tricuspid Valve: The tricuspid valve is normal in structure. Tricuspid valve regurgitation is trivial. Aortic Valve: The aortic valve is tricuspid. Aortic valve regurgitation is not visualized. Aortic valve sclerosis is present, with no evidence of aortic valve stenosis. Pulmonic Valve: The pulmonic valve was not well visualized. Pulmonic valve regurgitation is trivial. Aorta: The aortic root and ascending aorta are structurally normal, with no evidence of dilitation. Venous: The inferior vena cava is normal in size with greater than 50% respiratory variability, suggesting right atrial pressure of 3 mmHg. IAS/Shunts: The atrial septum is grossly normal.  LEFT VENTRICLE PLAX 2D LVIDd:         4.40 cm   Diastology LVIDs:         3.00 cm   LV e' medial:    8.05 cm/s LV PW:         1.20 cm   LV E/e' medial:  9.4 LV IVS:        1.20 cm   LV e' lateral:   12.60 cm/s LVOT diam:     2.10 cm   LV E/e' lateral: 6.0 LV SV:         62 LV SV Index:   29 LVOT Area:     3.46 cm  RIGHT VENTRICLE RV Basal diam:  3.50 cm LEFT ATRIUM           Index        RIGHT ATRIUM           Index LA diam:      3.70 cm 1.77 cm/m   RA Area:     20.70 cm LA Vol (A4C): 51.8 ml 24.73 ml/m  RA Volume:   55.40 ml  26.45 ml/m  AORTIC VALVE LVOT Vmax:   86.50 cm/s LVOT Vmean:  58.300 cm/s LVOT VTI:    0.178 m  AORTA Ao Root diam: 2.90 cm Ao Asc diam:  2.70 cm MITRAL VALVE MV Area (PHT): 3.99 cm    SHUNTS MV Decel Time: 190 msec    Systemic VTI:  0.18 m MV E velocity: 75.80 cm/s  Systemic Diam: 2.10 cm MV A velocity: 82.10 cm/s MV E/A ratio:  0.92 Gwyndolyn Kaufman MD Electronically signed by Gwyndolyn Kaufman MD Signature Date/Time: 01/01/2022/5:23:22 PM    Final     Labs:  CBC: Recent Labs    12/31/21 0203 01/01/22 0209 01/03/22 1601  01/04/22 0247  WBC 11.3* 12.2* 11.5* 14.9*  HGB 13.2 14.1 15.5* 13.9  HCT 41.5 42.2 48.5* 41.9  PLT 329 374 318 330    COAGS: Recent Labs    01/03/22 1601  APTT 26    BMP: Recent Labs    12/31/21 0203 01/01/22 0209 01/03/22 1601 01/04/22 0247  NA 140 137  --  137  K 3.6 3.9  --  4.0  CL 108 103  --  106  CO2 24 25  --  22  GLUCOSE 157* 222*  --  220*  BUN 7 7  --  10  CALCIUM 9.6 10.1  --  9.8  CREATININE 0.67 0.71 0.72 0.80  GFRNONAA >60 >60 >60 >60    LIVER FUNCTION TESTS: Recent Labs    01/04/22 0247  BILITOT 0.7  AST 20  ALT 17  ALKPHOS 137*  PROT 7.0  ALBUMIN 3.3*    Assessment and Plan:  Code stroke = s/p mechanical thrombectomy by Dr. Karenann Cai  Care per neuro and cardiology.  Can remove groin dressing tomorrow and shower.  Electronically Signed: Murrell Redden, PA-C 01/04/2022, 1:03 PM    I spent a total of 25 Minutes at the the patient's bedside AND on the patient's hospital floor or unit, greater than 50% of which was counseling/coordinating care for f/u stroke/cerebral intervention

## 2022-01-04 NOTE — TOC CAGE-AID Note (Signed)
Transition of Care [facility]) - CAGE-AID Screening   Patient Details  Name: [name] MRN: [id number] Date of Birth: 12/05/1965  Transition of Care [facility]) CM/SW Contact:    Carley Hammed, LCSWA Phone Number: 01/04/2022, 3:30 PM   Clinical Narrative:  CSW attempted CAGE- AID at bedside, pt sleeping and did not arouse to voice.  CAGE-AID Screening: Substance Abuse Screening unable to be completed due to: : Patient unable to participate (Sleeping)

## 2022-01-04 NOTE — Progress Notes (Signed)
Progress Note  Patient Name: [name] Date of Encounter: 01/04/2022  Primary Cardiologist:   Minus Breeding, MD   Subjective   Events noted post cath.   Currently denies pain or SOB  Inpatient Medications    Scheduled Meds:  aspirin  81 mg Oral Daily   Chlorhexidine Gluconate Cloth  6 each Topical Daily   fluticasone furoate-vilanterol  1 puff Inhalation Daily   And   umeclidinium bromide  1 puff Inhalation Daily   furosemide  40 mg Oral Daily   gabapentin  200 mg Oral QHS   insulin aspart  0-15 Units Subcutaneous TID WC   insulin aspart  0-5 Units Subcutaneous QHS   insulin aspart  6 Units Subcutaneous TID WC   insulin detemir  40 Units Subcutaneous BID   levothyroxine  137 mcg Oral QAC breakfast   metoprolol tartrate  25 mg Oral BID   montelukast  10 mg Oral Daily   pantoprazole  40 mg Oral Daily   rosuvastatin  20 mg Oral Daily   sodium chloride flush  3 mL Intravenous Q12H   sodium chloride flush  3 mL Intravenous Q12H   Continuous Infusions:  sodium chloride     sodium chloride 75 mL/hr at 01/04/22 0800   clevidipine Stopped (01/03/22 1620)   heparin 1,700 Units/hr (01/04/22 0800)   nitroGLYCERIN Stopped (01/03/22 1746)   PRN Meds: sodium chloride, acetaminophen **OR** acetaminophen (TYLENOL) oral liquid 160 mg/5 mL **OR** acetaminophen, albuterol, ALPRAZolam, nitroGLYCERIN, nitroGLYCERIN, ondansetron **OR** ondansetron (ZOFRAN) IV, mouth rinse, oxyCODONE-acetaminophen, senna-docusate, sodium chloride flush   Vital Signs    Vitals:   01/04/22 0700 01/04/22 0800 01/04/22 0900 01/04/22 1000  BP: 133/60 112/61 (!) 124/52 127/60  Pulse: 71 78 82 78  Resp: 16 (!) _0 Temp:  (!) 97.5 F (36.4 C)    TempSrc:  Oral    SpO2: 97% 93% 99% 96%  Weight:      Height:        Intake/Output Summary (Last 24 hours) at 01/04/2022 1240 Last data filed at 01/04/2022 0900 Gross per 24 hour  Intake 2623.23 ml  Output 1675 ml  Net 948.23 ml   Filed  Weights   12/31/21 0104 12/31/21 1832 01/03/22 1657  Weight: 108.9 kg 106.5 kg 104.2 kg    Telemetry    NSR - Personally Reviewed  ECG    NA - Personally Reviewed  Physical Exam   GEN: No acute distress.   Neck: No  JVD Cardiac: RRR, no murmurs, rubs, or gallops.  Respiratory: Clear  to auscultation bilaterally. GI: Soft, nontender, non-distended  MS: No  edema; No deformity.  Right femoral without bleeding or bruising.  Neuro:  Nonfocal  Psych: Normal affect   Labs    Chemistry Recent Labs  Lab 12/31/21 0203 01/01/22 0209 01/03/22 1601 01/04/22 0247  NA 140 137  --  137  K 3.6 3.9  --  4.0  CL 108 103  --  106  CO2 24 25  --  22  GLUCOSE 157* 222*  --  220*  BUN 7 7  --  10  CREATININE 0.67 0.71 0.72 0.80  CALCIUM 9.6 10.1  --  9.8  PROT  --   --   --  7.0  ALBUMIN  --   --   --  3.3*  AST  --   --   --  20  ALT  --   --   --  17  ALKPHOS  --   --   --  137*  BILITOT  --   --   --  0.7  GFRNONAA >60 >60 >60 >60  ANIONGAP 8 9  --  9     Hematology Recent Labs  Lab 01/01/22 0209 01/03/22 1601 01/04/22 0247  WBC 12.2* 11.5* 14.9*  RBC 5.04 5.63* 4.99  HGB 14.1 15.5* 13.9  HCT 42.2 48.5* 41.9  MCV 83.7 86.1 84.0  MCH 28.0 27.5 27.9  MCHC 33.4 32.0 33.2  RDW 15.0 14.6 14.6  PLT 374 318 330    Cardiac EnzymesNo results for input(s): "TROPONINI" in the last 168 hours. No results for input(s): "TROPIPOC" in the last 168 hours.   BNPNo results for input(s): "BNP", "PROBNP" in the last 168 hours.   DDimer No results for input(s): "DDIMER" in the last 168 hours.   Radiology    MR BRAIN WO CONTRAST  Result Date: 01/04/2022 CLINICAL DATA:  Status post thrombectomy. EXAM: MRI HEAD WITHOUT CONTRAST TECHNIQUE: Multiplanar, multiecho pulse sequences of the brain and surrounding structures were obtained without intravenous contrast. COMPARISON:  CT and CTA 01/03/2022 FINDINGS: Brain: Linear restricted diffusion with ADC correlate extending from the white  matter along the atrium of the left lateral ventricle, extending into the posterior left hippocampus (series 2, images 23-30) as well as along the left insula (series 2, images 23-25), with additional smaller foci of restricted diffusion in the anterior left hippocampus (series 2, image 22) and posterior left frontal lobe (series 2, image 42). No acute hemorrhage, mass mass effect, or midline shift. No hydrocephalus or extra-axial collection. No hemosiderin deposition to suggest remote hemorrhage. Vascular: Normal arterial flow voids. Loss of the left sigmoid and jugular flow void is favored to represent slow flow, given contrast opacification on 01/03/2022 CTA. Skull and upper cervical spine: Normal marrow signal. Sinuses/Orbits: No acute finding. Other: Trace fluid in the mastoid air cells. 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 No additional acute intracranial process. Electronically Signed   By: Merilyn Baba M.D.   On: 01/04/2022 02:05   CARDIAC CATHETERIZATION  Result Date: 01/03/2022   Prox RCA lesion is 30% stenosed.   Mid RCA lesion is 90% stenosed.   RPAV lesion is 90% stenosed.   Dist RCA lesion is 65% stenosed.   Mid Cx lesion is 90% stenosed.   Dist Cx lesion is 95% stenosed.   Ost Cx to Prox Cx lesion is 80% stenosed.   1st Diag lesion is 100% stenosed.   Prox LAD to Mid LAD lesion is 100% stenosed. Severe multivessel native CAD with total occlusion of the previously placed proximal LAD stent; proximal 80% circumflex stenosis immediately before the takeoff of the OM1 vessel with progressive 90 and 95% mid and mid distal stenoses in the circumflex; and large dominant RCA with 30% proximal stenosis, thrombotic stenosis of 90% in the region of the acute margin followed by diffuse irregularity beyond this without visualization of the PDA from the native injection but with 90%  distal stenosis proximal to the PDA vessel. Patent LIMA graft supplying the mid LAD. Patent SVG supplying the first diagonal vessel. Patent SVG supplying the OM1 vessel.  However, there are 90 and 95% stenosis in the AV groove circumflex beyond the OM1 graft. Patent SVG supplying the ostium of the PDA of the RCA. LVEDP 10 mmHg. RECOMMENDATION: Post procedure, the patient developed some  aphasia and short-term babbled speech which improved fairly quickly.  A code stroke was activated.  CT of the head was negative for any large hypodensity abnormality.  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  She underwent successful mechanical thrombectomy with stent retriever and direct contact aspiration with complete recanalization by interventional radiology.  There were no hemorrhagic or thromboembolic complications and she had normalization of symptoms.  Will need to defer initial plan for PCI until timing is appropriate.   IR PERCUTANEOUS ART THROMBECTOMY/INFUSION INTRACRANIAL INC DIAG ANGIO  Result Date: 01/03/2022 INDICATION: 56 year old female with past medical history significant for coronary artery disease status post 4 vessel CABG, hypertension, hyperlipidemia, type 2 diabetes mellitus, GERD, morbid obesity and hypothyroidism. Patient underwent a diagnostic coronary angiography today and was noted to be aphasic postprocedure; NIHSS 6. Her last known well was 10:30 a.m. on 01/03/2022. Head CT showed no acute intracranial abnormality. CT angiogram of the head and neck showed a mid left M2/MCA posterior division branch occlusion. She was then transferred to our service for a diagnostic cerebral angiogram and mechanical thrombectomy. EXAM: ULTRASOUND-GUIDED VASCULAR ACCESS DIAGNOSTIC CEREBRAL ANGIOGRAM MECHANICAL THROMBECTOMY FLAT PANEL HEAD CT COMPARISON:  CT/CT angiogram head and neck January 03, 2022. MEDICATIONS: Refer to anesthesia documentation.  ANESTHESIA/SEDATION: The procedure was performed under general anesthesia. CONTRAST:  50 mL of Omnipaque 300 milligram/mL FLUOROSCOPY: Radiation Exposure Index (as provided by the fluoroscopic device): 465 mGy Kerma COMPLICATIONS: None immediate. TECHNIQUE: Informed written consent was obtained from the patient after a thorough discussion of the procedural risks, benefits and alternatives. All questions were addressed. Maximal Sterile Barrier Technique was utilized including caps, mask, sterile gowns, sterile gloves, sterile drape, hand hygiene and skin antiseptic. A timeout was performed prior to the initiation of the procedure. The right groin was prepped and draped in the usual sterile fashion. Using a micropuncture kit and the modified Seldinger technique, access was gained to the right common femoral artery and an 8 French sheath was placed. Real-time ultrasound guidance was utilized for vascular access including the acquisition of a permanent ultrasound image documenting patency of the accessed vessel. Under fluoroscopy, a Zoom 88 guide catheter was navigated over a 6 [place] Berenstein 2 catheter and a 0.035" Terumo Glidewire into the aortic arch. The catheter was placed into the left common carotid artery and then advanced into the left internal carotid artery. The diagnostic catheter was removed. Frontal and lateral angiograms of the head were obtained. FINDINGS: 1. Normal caliber of the right common femoral artery, adequate for vascular access. 2. Occlusion of the mid left M2/MCA posterior division branch. PROCEDURE: Using biplane roadmap, a Zoom 55 aspiration catheter was navigated over Colossus 35 microguidewire into the cavernous segment of the left ICA. The aspiration catheter was then advanced to the distal left M1 segment. Attempt to advance the catheter into the M2/MCA posterior division branch over the Colossus 35 wire proved unsuccessful. A phenom 21 microcatheter was then navigated through the  zoom 55 catheter and over an Aristotle 14 microguidewire into the distal left M2/MCA posterior division branch. Then, a 4 x 40 mm solitaire stent retriever was deployed spanning the M2 segment. The device was allowed to intercalated with the clot for 4 minutes. The microcatheter was removed. The aspiration catheter was advanced to the level of occlusion and connected to an aspiration pump. The thrombectomy device and aspiration catheter were removed under constant aspiration. Left internal carotid artery angiograms with frontal and  lateral views of the head showed complete recanalization of the M2 segment with normal flow in the left MCA vascular tree (TICI 3). Flat panel CT of the head was obtained and post processed in a separate workstation with concurrent attending physician supervision. Selected images were sent to PACS. No evidence of hemorrhagic complication. The catheter was subsequently withdrawn. Right common femoral artery angiogram was obtained in right anterior oblique view. The puncture is at the level of the common femoral artery. The artery has normal caliber, adequate for closure device. The sheath was exchanged over the wire for a Perclose prostyle which was utilized for access closure. Immediate hemostasis was achieved. IMPRESSION: 1. Successful mechanical thrombectomy for treatment of a mid left M2/MCA posterior division branch occlusion achieving TICI 3 recanalization. 2. No evidence of thromboembolic or hemorrhagic complication. PLAN: Transfer to ICU for continued post stroke care. Electronically Signed   By: Pedro Earls M.D.   On: 01/03/2022 17:02   IR US Guide Vasc Access Right  Result Date: 01/03/2022 INDICATION: 56 year old female with past medical history significant for coronary artery disease status post 4 vessel CABG, hypertension, hyperlipidemia, type 2 diabetes mellitus, GERD, morbid obesity and hypothyroidism. Patient underwent a diagnostic coronary angiography  today and was noted to be aphasic postprocedure; NIHSS 6. Her last known well was 10:30 a.m. on 01/03/2022. Head CT showed no acute intracranial abnormality. CT angiogram of the head and neck showed a mid left M2/MCA posterior division branch occlusion. She was then transferred to our service for a diagnostic cerebral angiogram and mechanical thrombectomy. EXAM: ULTRASOUND-GUIDED VASCULAR ACCESS DIAGNOSTIC CEREBRAL ANGIOGRAM MECHANICAL THROMBECTOMY FLAT PANEL HEAD CT COMPARISON:  CT/CT angiogram head and neck January 03, 2022. MEDICATIONS: Refer to anesthesia documentation. ANESTHESIA/SEDATION: The procedure was performed under general anesthesia. CONTRAST:  50 mL of Omnipaque 300 milligram/mL FLUOROSCOPY: Radiation Exposure Index (as provided by the fluoroscopic device): 332 mGy Kerma COMPLICATIONS: None immediate. TECHNIQUE: Informed written consent was obtained from the patient after a thorough discussion of the procedural risks, benefits and alternatives. All questions were addressed. Maximal Sterile Barrier Technique was utilized including caps, mask, sterile gowns, sterile gloves, sterile drape, hand hygiene and skin antiseptic. A timeout was performed prior to the initiation of the procedure. The right groin was prepped and draped in the usual sterile fashion. Using a micropuncture kit and the modified Seldinger technique, access was gained to the right common femoral artery and an 8 French sheath was placed. Real-time ultrasound guidance was utilized for vascular access including the acquisition of a permanent ultrasound image documenting patency of the accessed vessel. Under fluoroscopy, a Zoom 88 guide catheter was navigated over a 6 [place] Berenstein 2 catheter and a 0.035" Terumo Glidewire into the aortic arch. The catheter was placed into the left common carotid artery and then advanced into the left internal carotid artery. The diagnostic catheter was removed. Frontal and lateral angiograms of the head  were obtained. FINDINGS: 1. Normal caliber of the right common femoral artery, adequate for vascular access. 2. Occlusion of the mid left M2/MCA posterior division branch. PROCEDURE: Using biplane roadmap, a Zoom 55 aspiration catheter was navigated over Colossus 35 microguidewire into the cavernous segment of the left ICA. The aspiration catheter was then advanced to the distal left M1 segment. Attempt to advance the catheter into the M2/MCA posterior division branch over the Colossus 35 wire proved unsuccessful. A phenom 21 microcatheter was then navigated through the zoom 55 catheter and over an Aristotle 14 microguidewire into the distal  left M2/MCA posterior division branch. Then, a 4 x 40 mm solitaire stent retriever was deployed spanning the M2 segment. The device was allowed to intercalated with the clot for 4 minutes. The microcatheter was removed. The aspiration catheter was advanced to the level of occlusion and connected to an aspiration pump. The thrombectomy device and aspiration catheter were removed under constant aspiration. Left internal carotid artery angiograms with frontal and lateral views of the head showed complete recanalization of the M2 segment with normal flow in the left MCA vascular tree (TICI 3). Flat panel CT of the head was obtained and post processed in a separate workstation with concurrent attending physician supervision. Selected images were sent to PACS. No evidence of hemorrhagic complication. The catheter was subsequently withdrawn. Right common femoral artery angiogram was obtained in right anterior oblique view. The puncture is at the level of the common femoral artery. The artery has normal caliber, adequate for closure device. The sheath was exchanged over the wire for a Perclose prostyle which was utilized for access closure. Immediate hemostasis was achieved. IMPRESSION: 1. Successful mechanical thrombectomy for treatment of a mid left M2/MCA posterior division branch  occlusion achieving TICI 3 recanalization. 2. No evidence of thromboembolic or hemorrhagic complication. PLAN: Transfer to ICU for continued post stroke care. Electronically Signed   By: Pedro Earls M.D.   On: 01/03/2022 17:02   IR CT Head Ltd  Result Date: 01/03/2022 INDICATION: 56 year old female with past medical history significant for coronary artery disease status post 4 vessel CABG, hypertension, hyperlipidemia, type 2 diabetes mellitus, GERD, morbid obesity and hypothyroidism. Patient underwent a diagnostic coronary angiography today and was noted to be aphasic postprocedure; NIHSS 6. Her last known well was 10:30 a.m. on 01/03/2022. Head CT showed no acute intracranial abnormality. CT angiogram of the head and neck showed a mid left M2/MCA posterior division branch occlusion. She was then transferred to our service for a diagnostic cerebral angiogram and mechanical thrombectomy. EXAM: ULTRASOUND-GUIDED VASCULAR ACCESS DIAGNOSTIC CEREBRAL ANGIOGRAM MECHANICAL THROMBECTOMY FLAT PANEL HEAD CT COMPARISON:  CT/CT angiogram head and neck January 03, 2022. MEDICATIONS: Refer to anesthesia documentation. ANESTHESIA/SEDATION: The procedure was performed under general anesthesia. CONTRAST:  50 mL of Omnipaque 300 milligram/mL FLUOROSCOPY: Radiation Exposure Index (as provided by the fluoroscopic device): 956 mGy Kerma COMPLICATIONS: None immediate. TECHNIQUE: Informed written consent was obtained from the patient after a thorough discussion of the procedural risks, benefits and alternatives. All questions were addressed. Maximal Sterile Barrier Technique was utilized including caps, mask, sterile gowns, sterile gloves, sterile drape, hand hygiene and skin antiseptic. A timeout was performed prior to the initiation of the procedure. The right groin was prepped and draped in the usual sterile fashion. Using a micropuncture kit and the modified Seldinger technique, access was gained to the right  common femoral artery and an 8 French sheath was placed. Real-time ultrasound guidance was utilized for vascular access including the acquisition of a permanent ultrasound image documenting patency of the accessed vessel. Under fluoroscopy, a Zoom 88 guide catheter was navigated over a 6 [place] Berenstein 2 catheter and a 0.035" Terumo Glidewire into the aortic arch. The catheter was placed into the left common carotid artery and then advanced into the left internal carotid artery. The diagnostic catheter was removed. Frontal and lateral angiograms of the head were obtained. FINDINGS: 1. Normal caliber of the right common femoral artery, adequate for vascular access. 2. Occlusion of the mid left M2/MCA posterior division branch. PROCEDURE: Using biplane roadmap, a  Zoom 55 aspiration catheter was navigated over Colossus 35 microguidewire into the cavernous segment of the left ICA. The aspiration catheter was then advanced to the distal left M1 segment. Attempt to advance the catheter into the M2/MCA posterior division branch over the Colossus 35 wire proved unsuccessful. A phenom 21 microcatheter was then navigated through the zoom 55 catheter and over an Aristotle 14 microguidewire into the distal left M2/MCA posterior division branch. Then, a 4 x 40 mm solitaire stent retriever was deployed spanning the M2 segment. The device was allowed to intercalated with the clot for 4 minutes. The microcatheter was removed. The aspiration catheter was advanced to the level of occlusion and connected to an aspiration pump. The thrombectomy device and aspiration catheter were removed under constant aspiration. Left internal carotid artery angiograms with frontal and lateral views of the head showed complete recanalization of the M2 segment with normal flow in the left MCA vascular tree (TICI 3). Flat panel CT of the head was obtained and post processed in a separate workstation with concurrent attending physician supervision.  Selected images were sent to PACS. No evidence of hemorrhagic complication. The catheter was subsequently withdrawn. Right common femoral artery angiogram was obtained in right anterior oblique view. The puncture is at the level of the common femoral artery. The artery has normal caliber, adequate for closure device. The sheath was exchanged over the wire for a Perclose prostyle which was utilized for access closure. Immediate hemostasis was achieved. IMPRESSION: 1. Successful mechanical thrombectomy for treatment of a mid left M2/MCA posterior division branch occlusion achieving TICI 3 recanalization. 2. No evidence of thromboembolic or hemorrhagic complication. PLAN: Transfer to ICU for continued post stroke care. Electronically Signed   By: Pedro Earls M.D.   On: 01/03/2022 17:02   CT ANGIO HEAD NECK W WO CM (CODE STROKE)  Result Date: 01/03/2022 CLINICAL DATA:  Neuro deficit, acute, stroke suspected EXAM: CT ANGIOGRAPHY HEAD AND NECK TECHNIQUE: Multidetector CT imaging of the head and neck was performed using the standard protocol during bolus administration of intravenous contrast. Multiplanar CT image reconstructions and MIPs were obtained to evaluate the vascular anatomy. Carotid stenosis measurements (when applicable) are obtained utilizing NASCET criteria, using the distal internal carotid diameter as the denominator. RADIATION DOSE REDUCTION: This exam was performed according to the departmental dose-optimization program which includes automated exposure control, adjustment of the mA and/or kV according to patient size and/or use of iterative reconstruction technique. CONTRAST:  29m OMNIPAQUE IOHEXOL 350 MG/ML SOLN COMPARISON:  None Available. FINDINGS: CTA NECK Aortic arch: Mild plaque. Great vessel origins are patent. Mixed plaque along the proximal left subclavian without high-grade stenosis. Right carotid system: Patent. Mild calcified plaque at the distal common carotid. No  stenosis. Left carotid system: Patent.  No stenosis. Vertebral arteries: Patent and codominant.  No stenosis. Skeleton: No significant osseous abnormality. Other neck: Unremarkable. Upper chest: No apical lung mass. Review of the MIP images confirms the above findings CTA HEAD Anterior circulation: Intracranial internal carotid arteries are patent with mild calcified plaque. Left M1 MCA is patent. There is occlusion of a left M2 MCA branch approximally 1.5 cm posterior to the bifurcation. Reconstitution more distally. Right middle cerebral artery is patent. Anterior cerebral arteries are patent. Posterior circulation: Intracranial vertebral arteries, basilar artery, and posterior cerebral arteries are patent. Major cerebellar artery origins are patent. Venous sinuses: Patent as allowed by contrast bolus timing. Review of the MIP images confirms the above findings IMPRESSION: Mid left M2 MCA  branch occlusion with reconstitution more distally. No hemodynamically significant stenosis in the neck. These results were called by telephone at the time of interpretation on 01/03/2022 at 1:29 pm to provider [facility] , who verbally acknowledged these results. Electronically Signed   By: Macy Mis M.D.   On: 01/03/2022 13:35   CT HEAD CODE STROKE WO CONTRAST  Result Date: 01/03/2022 CLINICAL DATA:  Neuro deficit, acute, stroke suspected EXAM: CT HEAD WITHOUT CONTRAST TECHNIQUE: Contiguous axial images were obtained from the base of the skull through the vertex without intravenous contrast. RADIATION DOSE REDUCTION: This exam was performed according to the departmental dose-optimization program which includes automated exposure control, adjustment of the mA and/or kV according to patient size and/or use of iterative reconstruction technique. COMPARISON:  None Available. FINDINGS: Intravascular contrast is present presumably from cardiac catheterization. Brain: There is no acute intracranial hemorrhage, mass  effect, or edema. Gray-white differentiation is preserved. There is no extra-axial fluid collection. Ventricles and sulci are within normal limits in size and configuration. Vascular: Cannot evaluate for hyperdense vessel due to above. Skull: Calvarium is unremarkable. Sinuses/Orbits: No acute finding. Other: None. IMPRESSION: There is no acute intracranial hemorrhage or evidence of acute infarction. ASPECT score is 10. Residual intravascular contest precludes evaluation for hyperdense vessel. These results were communicated to Dr. Lorrin Goodell at 1:05 pm on 01/03/2022 by text page via the [facility] messaging system. Electronically Signed   By: Macy Mis M.D.   On: 01/03/2022 13:22    Cardiac Studies    Cardiac cath   01/03/2022  Diagnostic Dominance: Right     Patient Profile     56 y.o. female with a past medical history of CAD status post 4v CABG (LIMA to LAD, SVG to diagonal, SVG to OM, SVG to PDA), hypertension, hyperlipidemia, diabetes, GERD, morbid obesity, hypothyroidism who is being seen for the evaluation of chest pain   Assessment & Plan    Unstable angina/NSTEMI:    CAD as above.  The plan was to consider PCI of RCA and distal circ.  However, given the acute neurologic events and the fact that that flow through the grafts is uncompromised, I would be inclined to managed this medically and based on symptoms.  I discussed this with the patient and she would very much agree  CVA:  Per neurology.    HTN:   Controlled.  Continue current therapy.     HLD: Managed through the lipid clinic.    DM: Hemoglobin A1c 7.8.  Per primary team.   For questions or updates, please contact [facility] Please consult [email or web address] for contact info under Cardiology/STEMI.   Signed, Minus Breeding, MD  01/04/2022, 12:40 PM

## 2022-01-04 NOTE — Evaluation (Signed)
Occupational Therapy Evaluation Patient Details Name: [name] MRN: [id number] DOB: 1965/06/02 Today's Date: 01/04/2022   History of Present Illness Pt is a 56 y/o female presenting on 8/12 for burning in her chest. S/P L heart cath and angiography 8/15. Complicated by post procedure aphasia. CT with L MCA stroke, MRI with small acute infarcts in L hippocampus, L insula, L posterior frontal lobe and white matter along the atrium of the L lateral ventricle.S/P thrombectomy 8/15.   PMH includes: CAD with prior stents and CABG x 4 in 2010, obesity, recurrent esophageal strictures, DM2, anxiety, glaucoma, HTN.   Clinical Impression   PTA patient reports independent and driving, not working. Admitted for above and limited by problem list below, including decreased activity tolerance, and mild cognitive deficits.  She completes ADLs and mobility with supervision. Engaged in dual cognitive testing and demonstrates difficulty completing tasks in distracting environment, able to complete short blessed test with normal score but demonstrates slight decreased STM and attention (able to sequence but requires increased time).  Based on performance today, believe she will benefit from continued OT services acutely to optimize independence and return to PLOF but anticipate no further needs after dc home.      Recommendations for follow up therapy are one component of a multi-disciplinary discharge planning process, led by the attending physician.  Recommendations may be updated based on patient status, additional functional criteria and insurance authorization.   Follow Up Recommendations  No OT follow up    Assistance Recommended at Discharge Intermittent Supervision/Assistance  Patient can return home with the following Assistance with cooking/housework;Direct supervision/assist for medications management;Direct supervision/assist for financial management;Assist for transportation    Functional  Status Assessment  Patient has had a recent decline in their functional status and demonstrates the ability to make significant improvements in function in a reasonable and predictable amount of time.  Equipment Recommendations  None recommended by OT    Recommendations for Other Services       Precautions / Restrictions Precautions Precautions: Fall Restrictions Weight Bearing Restrictions: No      Mobility Bed Mobility               General bed mobility comments: OOB upon entry    Transfers Overall transfer level: Needs assistance   Transfers: Sit to/from Stand Sit to Stand: Supervision                  Balance Overall balance assessment: Mild deficits observed, not formally tested                                         ADL either performed or assessed with clinical judgement   ADL Overall ADL's : Needs assistance/impaired     Grooming: Supervision/safety;Standing           Upper Body Dressing : Supervision/safety;Sitting   Lower Body Dressing: Supervision/safety;Sit to/from stand   Toilet Transfer: Supervision/safety;Ambulation   Toileting- Clothing Manipulation and Hygiene: Modified independent;Sit to/from stand       Functional mobility during ADLs: Supervision/safety       Vision Baseline Vision/History: 1 Wears glasses Vision Assessment?: No apparent visual deficits     Perception     Praxis      Pertinent Vitals/Pain Pain Assessment Pain Assessment: 0-10 Pain Score: 7  Pain Location: L back Pain Descriptors / Indicators: Discomfort, Grimacing Pain Intervention(s): Limited activity within patient's  tolerance     Hand Dominance Right   Extremity/Trunk Assessment Upper Extremity Assessment Upper Extremity Assessment: Overall WFL for tasks assessed   Lower Extremity Assessment Lower Extremity Assessment: Defer to PT evaluation   Cervical / Trunk Assessment Cervical / Trunk Assessment: Normal    Communication Communication Communication: No difficulties   Cognition Arousal/Alertness: Awake/alert Behavior During Therapy: WFL for tasks assessed/performed Overall Cognitive Status: Impaired/Different from baseline Area of Impairment: Memory, Problem solving, Attention                   Current Attention Level: Selective Memory: Decreased short-term memory       Problem Solving: Difficulty sequencing, Requires verbal cues General Comments: pt with mild deficits noted in short term memory, attention and sequencing during short blessed test; difficutly with dual cognitive tasks--2/28 on SBT (normal)     General Comments  VSS on RA    Exercises     Shoulder Instructions      Home Living Family/patient expects to be discharged to:: Private residence Living Arrangements: Spouse/significant other (fiance) Available Help at Discharge: Family Type of Home: Apartment Home Access: Stairs to enter [employer] of Steps: 15 (2nd floor) Entrance Stairs-Rails: Right;Left Home Layout: One level     Bathroom Shower/Tub: Chief Strategy Officer: Standard     Home Equipment: Agricultural consultant (2 wheels);Cane - single point;Grab bars - tub/shower   Additional Comments: fiance works 5am -230pm      Prior Functioning/Environment Prior Level of Function : Independent/Modified Independent;Driving               ADLs Comments: independent IADLs, driving; enjoys puzzle books, cleaning        OT Problem List: Decreased activity tolerance;Decreased cognition;Obesity      OT Treatment/Interventions: Self-care/ADL training;DME and/or AE instruction;Cognitive remediation/compensation;Patient/family education;Balance training    OT Goals(Current goals can be found in the care plan section) Acute Rehab OT Goals Patient Stated Goal: home OT Goal Formulation: With patient Time For Goal Achievement: 01/18/22 Potential to Achieve Goals: Good  OT  Frequency: Min 2X/week    Co-evaluation              AM-PAC OT "6 Clicks" Daily Activity     Outcome Measure Help from another person eating meals?: None Help from another person taking care of personal grooming?: A Little Help from another person toileting, which includes using toliet, bedpan, or urinal?: A Little Help from another person bathing (including washing, rinsing, drying)?: A Little Help from another person to put on and taking off regular upper body clothing?: A Little Help from another person to put on and taking off regular lower body clothing?: A Little 6 Click Score: 19   End of Session Nurse Communication: Mobility status  Activity Tolerance: Patient tolerated treatment well Patient left: in chair;with call bell/phone within reach;with chair alarm set  OT Visit Diagnosis: Other abnormalities of gait and mobility (R26.89);Other symptoms and signs involving cognitive function                Time: 4967-5916 OT Time Calculation (min): 29 min Charges:  OT General Charges $OT Visit: 1 Visit OT Evaluation $OT Eval Moderate Complexity: 1 Mod OT Treatments $Self Care/Home Management : 8-22 mins  Barry Brunner, OT Acute Rehabilitation Services Office [phone]   [name] 01/04/2022, 11:40 AM

## 2022-01-05 ENCOUNTER — Other Ambulatory Visit (HOSPITAL_COMMUNITY): Payer: Self-pay

## 2022-01-05 DIAGNOSIS — I214 Non-ST elevation (NSTEMI) myocardial infarction: Secondary | ICD-10-CM | POA: Diagnosis not present

## 2022-01-05 LAB — CBC
HCT: 38.2 % (ref 36.0–46.0)
Hemoglobin: 12.4 g/dL (ref 12.0–15.0)
MCH: 28 pg (ref 26.0–34.0)
MCHC: 32.5 g/dL (ref 30.0–36.0)
MCV: 86.2 fL (ref 80.0–100.0)
Platelets: 285 10*3/uL (ref 150–400)
RBC: 4.43 MIL/uL (ref 3.87–5.11)
RDW: 14.9 % (ref 11.5–15.5)
WBC: 10.2 10*3/uL (ref 4.0–10.5)
nRBC: 0 % (ref 0.0–0.2)

## 2022-01-05 LAB — BASIC METABOLIC PANEL
Anion gap: 7 (ref 5–15)
BUN: 10 mg/dL (ref 6–20)
CO2: 26 mmol/L (ref 22–32)
Calcium: 9.5 mg/dL (ref 8.9–10.3)
Chloride: 106 mmol/L (ref 98–111)
Creatinine, Ser: 0.72 mg/dL (ref 0.44–1.00)
GFR, Estimated: >60 mL/min (ref >60)
Glucose, Bld: 136 mg/dL — ABNORMAL HIGH (ref 70–99)
Potassium: 3.9 mmol/L (ref 3.5–5.1)
Sodium: 139 mmol/L (ref 135–145)

## 2022-01-05 LAB — PHOSPHORUS: Phosphorus: 3 mg/dL (ref 2.5–4.6)

## 2022-01-05 LAB — GLUCOSE, CAPILLARY
Glucose-Capillary: 127 mg/dL — ABNORMAL HIGH (ref 70–99)
Glucose-Capillary: 162 mg/dL — ABNORMAL HIGH (ref 70–99)

## 2022-01-05 LAB — LIPOPROTEIN A (LPA): Lipoprotein (a): 10.9 nmol/L (ref <75.0)

## 2022-01-05 LAB — MAGNESIUM: Magnesium: 1.8 mg/dL (ref 1.7–2.4)

## 2022-01-05 MED ORDER — GABAPENTIN 100 MG PO CAPS
200.0000 mg | ORAL_CAPSULE | Freq: Every day | ORAL | 1 refills | Status: DC
  Filled 2022-01-05: qty 60, 30d supply, fill #0

## 2022-01-05 MED ORDER — ROSUVASTATIN CALCIUM 20 MG PO TABS
20.0000 mg | ORAL_TABLET | Freq: Every day | ORAL | 1 refills | Status: DC
  Filled 2022-01-05: qty 30, 30d supply, fill #0

## 2022-01-05 MED ORDER — CLOPIDOGREL BISULFATE 75 MG PO TABS
75.0000 mg | ORAL_TABLET | Freq: Every day | ORAL | 1 refills | Status: DC
  Filled 2022-01-05: qty 30, 30d supply, fill #0

## 2022-01-05 MED ORDER — FUROSEMIDE 40 MG PO TABS
40.0000 mg | ORAL_TABLET | Freq: Every day | ORAL | 1 refills | Status: DC
  Filled 2022-01-05: qty 30, 30d supply, fill #0

## 2022-01-05 MED ORDER — ASPIRIN 81 MG PO CHEW
81.0000 mg | CHEWABLE_TABLET | Freq: Every day | ORAL | 1 refills | Status: AC
  Filled 2022-01-05: qty 30, 30d supply, fill #0

## 2022-01-05 MED ORDER — MONTELUKAST SODIUM 10 MG PO TABS
10.0000 mg | ORAL_TABLET | Freq: Every day | ORAL | 1 refills | Status: DC
  Filled 2022-01-05: qty 30, 30d supply, fill #0

## 2022-01-05 MED ORDER — METOPROLOL TARTRATE 25 MG PO TABS
25.0000 mg | ORAL_TABLET | Freq: Two times a day (BID) | ORAL | 1 refills | Status: DC
  Filled 2022-01-05: qty 60, 30d supply, fill #0

## 2022-01-05 MED ORDER — NITROGLYCERIN 0.4 MG SL SUBL
0.4000 mg | SUBLINGUAL_TABLET | SUBLINGUAL | 0 refills | Status: DC | PRN
Start: 2022-01-05 — End: 2022-07-28
  Filled 2022-01-05: qty 25, 30d supply, fill #0

## 2022-01-05 MED ORDER — LEVOTHYROXINE SODIUM 137 MCG PO TABS
137.0000 ug | ORAL_TABLET | Freq: Every day | ORAL | 1 refills | Status: DC
  Filled 2022-01-05: qty 30, 30d supply, fill #0

## 2022-01-05 NOTE — Inpatient Diabetes Management (Signed)
Inpatient Diabetes Program Recommendations  AACE/ADA: New Consensus Statement on Inpatient Glycemic Control (2015)  Target Ranges:  Prepandial:   less than 140 mg/dL      Peak postprandial:   less than 180 mg/dL (1-2 hours)      Critically ill patients:  140 - 180 mg/dL   Lab Results  Component Value Date   GLUCAP 162 (H) 01/05/2022   HGBA1C 7.8 (H) 12/31/2021    Review of Glycemic Control  Diabetes history: DM1(does not make insulin.  Needs correction, basal and meal coverage)  Outpatient Diabetes medications:  Medtronic insulin pump & Freestyle Libre 14 day Basal settings: 00:00-02:30--3.45 units/hr 02:30-04:00--2.45 units/hr 04:00-08:30--3.10 units/hr 08:30-17:00-1.85 units/hr 17:00-20:00-2.10 units/hr 20:00-00:00-3.45 units/hr Total basal insulin in 24 hrs= 62.075  Insulin Carb ratio: 00:00-17:00-- 1unit: 3 carbs 17:00-00:00-- 1unit: 1.8 carbs  Insulin Sensitivity Factor: 1 unit brings her down 22 mg/dL  Target Blood Glucose: 00:00-08:00--115 08:00-00:00--110  Active Insulin Time: 3 hrs  Current orders for Inpatient glycemic control:  Levemir 40 units BID Novolog 0-15 units TID and 0-5 units QHS Novolog 6 units BID   Spoke with patient at bedside.  She is discharging home today.  She is current with her endocrinologist in [place].  Has had T1DM since age 56.  Has been using the insulin pump for 6 years.    She received Levemir 40 units at 9 pm last evening and again at 1200 today as insulin pump was removed on admission.  She should not connect pump continuously until apprx 24 hrs after last Levemir dose.  She should connect pump for boluses at meals today and tomorrow and place pump back on continuously tomorrow at dinner time.  Patient verbalizes understanding.   She should apply her [facility] and start using that once she is home.    Will continue to follow while inpatient.  Thank you, Dulce Sellar, MSN, CDCES Diabetes Coordinator Inpatient  Diabetes Program [phone] (team pager from 8a-5p)

## 2022-01-05 NOTE — Progress Notes (Signed)
Occupational Therapy Treatment Patient Details Name: [name] MRN: [id number] DOB: [birth year] Today's Date: 01/05/2022   History of present illness Pt is a 56 y/o female presenting on 8/12 for burning in her chest. S/P L heart cath and angiography 8/15. Complicated by post procedure aphasia. CT with L MCA stroke, MRI with small acute infarcts in L hippocampus, L insula, L posterior frontal lobe and white matter along the atrium of the L lateral ventricle.S/P thrombectomy 8/15.   PMH includes: CAD with prior stents and CABG x 4 in 2010, obesity, recurrent esophageal strictures, DM2, anxiety, glaucoma, HTN.   OT comments  Pt seen for assessment of higher level cognition using the pill box test.  Pt failed the assessment, demonstrating poor attention, recall, planning, mental flexibility, suboptimal search strategies, concrete thinking and the inability to multitask; she also presents with decreased awareness to deficits. Pt had a total of 36 errors, where more than 3 errors is considered a fail- see below for details. Educated pt on recommendations for assist with ALL IADLS (including driving, cooking and med/finance mgmt), pt agreeable.  Discussed compensatory strategies using timers, calenders and writing down things she needs to remember. Will follow acutely, updated dc plan to outpatient neuro OT.    Recommendations for follow up therapy are one component of a multi-disciplinary discharge planning process, led by the attending physician.  Recommendations may be updated based on patient status, additional functional criteria and insurance authorization.    Follow Up Recommendations  Outpatient OT    Assistance Recommended at Discharge Intermittent Supervision/Assistance  Patient can return home with the following  Assistance with cooking/housework;Direct supervision/assist for medications management;Direct supervision/assist for financial management;Assist for transportation   Equipment  Recommendations  None recommended by OT    Recommendations for Other Services      Precautions / Restrictions Precautions Precautions: Fall Restrictions Weight Bearing Restrictions: No       Mobility Bed Mobility Overal bed mobility: Modified Independent             General bed mobility comments: to/from EOB    Transfers                         Balance                                           ADL either performed or assessed with clinical judgement   ADL Overall ADL's : Modified independent                                            Extremity/Trunk Assessment              Vision       Perception     Praxis      Cognition Arousal/Alertness: Awake/alert Behavior During Therapy: WFL for tasks assessed/performed Overall Cognitive Status: Impaired/Different from baseline Area of Impairment: Memory, Problem solving, Attention, Awareness                   Current Attention Level: Sustained Memory: Decreased short-term memory     Awareness: Emergent Problem Solving: Difficulty sequencing, Requires verbal cues, Slow processing General Comments: pt completed pill box test, failed- see below.  demonstrates difficutly with awareness of deficits  General Comments: Assessed using the Pill Box Test. Pt failed the assessment, demonstrating poor planning, mental flexibility, suboptimal search strategies, concrete thinking and the inability to multitask. Pt had a total of 36 errors, where more than 3 errors is considered a fail.  Errors: One tablet 3x/day (yellow) - 18 errors (omission/misplacement) One tablet 2x/day with breakfast and dinner (green) - 12 errors (omission/misplacement) One tablet in the morning (Blue)- 6 errors (omission/misplacement) One tablet daily at bedtime (orange) - 0 errors(omission/misplacement) One tablet every other day (red) - 0 errors (omission/misplacement)   Number of  misplaced movement errors (pills placed in incorrect compartment)- 0 Number of total errors (sum of omissions; misplacements) - 36 Total time to complete task (allowed 5 min) - 5 min  Pt completed pill box test demonstrating decreased memory, attention and problem solving.  She completed 1st pill correctly (1x a bedtime) and last pill correctly (every other day), but during the 3 other medications she filled out 1 day and left the remainder empty.  Pt reports "not understanding the pill box container" but she correctly used it for 2 medications, decreased processing, attention and recall affecting ability to complete independently.      Exercises      Shoulder Instructions       General Comments reviewed recommendations for no driving, only microwave/cold meals when alone, educated on using pill box with assist from family    Pertinent Vitals/ Pain       Pain Assessment Pain Assessment: No/denies pain  Home Living                                          Prior Functioning/Environment              Frequency  Min 2X/week        Progress Toward Goals  OT Goals(current goals can now be found in the care plan section)  Progress towards OT goals: Progressing toward goals  Acute Rehab OT Goals Patient Stated Goal: home today OT Goal Formulation: With patient Time For Goal Achievement: 01/18/22 Potential to Achieve Goals: Good  Plan Frequency remains appropriate;Discharge plan needs to be updated    Co-evaluation                 AM-PAC OT "6 Clicks" Daily Activity     Outcome Measure   Help from another person eating meals?: None Help from another person taking care of personal grooming?: None Help from another person toileting, which includes using toliet, bedpan, or urinal?: None Help from another person bathing (including washing, rinsing, drying)?: None Help from another person to put on and taking off regular upper body clothing?:  None Help from another person to put on and taking off regular lower body clothing?: None 6 Click Score: 24    End of Session    OT Visit Diagnosis: Other abnormalities of gait and mobility (R26.89);Other symptoms and signs involving cognitive function   Activity Tolerance Patient tolerated treatment well   Patient Left in bed;with call bell/phone within reach   Nurse Communication Mobility status        Time: 0175-1025 OT Time Calculation (min): 23 min  Charges: OT General Charges $OT Visit: 1 Visit OT Treatments $Self Care/Home Management : 8-22 mins $Cognitive Funtion inital: Initial 15 mins  Barry Brunner, OT Acute Rehabilitation Services Office [phone]   [name] 01/05/2022, 9:20 AM

## 2022-01-05 NOTE — TOC Transition Note (Signed)
Transition of Care [facility]) - CM/SW Discharge Note   Patient Details  Name: [name] MRN: [id number] Date of Birth: [birth year]  [facility]) CM/SW Contact:  Kermit Balo, RN Phone Number: 01/05/2022, 11:04 AM   Clinical Narrative:    Pt discharging home with outpatient therapy through [facility]. Information on the AVS.  No DME needs.  Pt denies issues with home medications or transportation.   Final next level of care: OP Rehab Barriers to Discharge: No Barriers Identified   Patient Goals and CMS Choice     Choice offered to / list presented to : Patient  Discharge Placement                       Discharge Plan and Services                                     Social Determinants of Health (SDOH) Interventions     Readmission Risk Interventions     No data to display

## 2022-01-05 NOTE — Progress Notes (Signed)
Progress Note  Patient Name: [name] Date of Encounter: 01/05/2022  Primary Cardiologist:   Minus Breeding, MD   Subjective   No pain.  No SOB. Did well with PT  Inpatient Medications    Scheduled Meds:  aspirin  81 mg Oral Daily   Chlorhexidine Gluconate Cloth  6 each Topical Daily   clopidogrel  75 mg Oral Daily   fluticasone furoate-vilanterol  1 puff Inhalation Daily   And   umeclidinium bromide  1 puff Inhalation Daily   furosemide  40 mg Oral Daily   gabapentin  200 mg Oral QHS   insulin aspart  0-15 Units Subcutaneous TID WC   insulin aspart  0-5 Units Subcutaneous QHS   insulin aspart  6 Units Subcutaneous TID WC   insulin detemir  40 Units Subcutaneous BID   levothyroxine  137 mcg Oral QAC breakfast   metoprolol tartrate  25 mg Oral BID   montelukast  10 mg Oral Daily   pantoprazole  40 mg Oral Daily   rosuvastatin  20 mg Oral Daily   sodium chloride flush  3 mL Intravenous Q12H   sodium chloride flush  3 mL Intravenous Q12H   Continuous Infusions:  sodium chloride     PRN Meds: sodium chloride, acetaminophen **OR** acetaminophen (TYLENOL) oral liquid 160 mg/5 mL **OR** acetaminophen, albuterol, ALPRAZolam, diazepam, nitroGLYCERIN, nitroGLYCERIN, ondansetron **OR** ondansetron (ZOFRAN) IV, mouth rinse, oxyCODONE-acetaminophen, senna-docusate, sodium chloride flush   Vital Signs    Vitals:   01/04/22 2100 01/04/22 2200 01/04/22 2335 01/05/22 0749  BP: 117/62 (!) 122/55 (!) 94/44 (!) 117/59  Pulse: 71 69 61 69  Resp: $Remo'18 17 18 20  '[place]$ Temp:   98.3 F (36.8 C) (!) 97.5 F (36.4 C)  TempSrc:   Oral Oral  SpO2: 95% 95% 96% 100%  Weight:      Height:        Intake/Output Summary (Last 24 hours) at 01/05/2022 0756 Last data filed at 01/04/2022 2200 Gross per 24 hour  Intake 1725.9 ml  Output 700 ml  Net 1025.9 ml   Filed Weights   12/31/21 0104 12/31/21 1832 01/03/22 1657  Weight: 108.9 kg 106.5 kg 104.2 kg    Telemetry    NSR - Personally  Reviewed  ECG    NA - Personally Reviewed  Physical Exam   GEN: No  acute distress.   Neck: No  JVD Cardiac: RRR, no murmurs, rubs, or gallops.  Respiratory: Clear   to auscultation bilaterally. GI: Soft, nontender, non-distended, normal bowel sounds  MS:  no edema; No deformity. Neuro:   Nonfocal except mild facial droop Psych: Oriented and appropriate    Labs    Chemistry Recent Labs  Lab 12/31/21 0203 01/01/22 0209 01/03/22 1601 01/04/22 0247  NA 140 137  --  137  K 3.6 3.9  --  4.0  CL 108 103  --  106  CO2 24 25  --  22  GLUCOSE 157* 222*  --  220*  BUN 7 7  --  10  CREATININE 0.67 0.71 0.72 0.80  CALCIUM 9.6 10.1  --  9.8  PROT  --   --   --  7.0  ALBUMIN  --   --   --  3.3*  AST  --   --   --  20  ALT  --   --   --  17  ALKPHOS  --   --   --  137*  BILITOT  --   --   --  0.7  GFRNONAA >60 >60 >60 >60  ANIONGAP 8 9  --  9     Hematology Recent Labs  Lab 01/01/22 0209 01/03/22 1601 01/04/22 0247  WBC 12.2* 11.5* 14.9*  RBC 5.04 5.63* 4.99  HGB 14.1 15.5* 13.9  HCT 42.2 48.5* 41.9  MCV 83.7 86.1 84.0  MCH 28.0 27.5 27.9  MCHC 33.4 32.0 33.2  RDW 15.0 14.6 14.6  PLT 374 318 330    Cardiac EnzymesNo results for input(s): "TROPONINI" in the last 168 hours. No results for input(s): "TROPIPOC" in the last 168 hours.   BNPNo results for input(s): "BNP", "PROBNP" in the last 168 hours.   DDimer No results for input(s): "DDIMER" in the last 168 hours.   Radiology    MR BRAIN WO CONTRAST  Result Date: 01/04/2022 CLINICAL DATA:  Status post thrombectomy. EXAM: MRI HEAD WITHOUT CONTRAST TECHNIQUE: Multiplanar, multiecho pulse sequences of the brain and surrounding structures were obtained without intravenous contrast. COMPARISON:  CT and CTA 01/03/2022 FINDINGS: Brain: Linear restricted diffusion with ADC correlate extending from the white matter along the atrium of the left lateral ventricle, extending into the posterior left hippocampus (series 2,  images 23-30) as well as along the left insula (series 2, images 23-25), with additional smaller foci of restricted diffusion in the anterior left hippocampus (series 2, image 22) and posterior left frontal lobe (series 2, image 42). No acute hemorrhage, mass mass effect, or midline shift. No hydrocephalus or extra-axial collection. No hemosiderin deposition to suggest remote hemorrhage. Vascular: Normal arterial flow voids. Loss of the left sigmoid and jugular flow void is favored to represent slow flow, given contrast opacification on 01/03/2022 CTA. Skull and upper cervical spine: Normal marrow signal. Sinuses/Orbits: No acute finding. Other: Trace fluid in the mastoid air cells. 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 No additional acute intracranial process. Electronically Signed   By: Merilyn Baba M.D.   On: 01/04/2022 02:05   CARDIAC CATHETERIZATION  Result Date: 01/03/2022   Prox RCA lesion is 30% stenosed.   Mid RCA lesion is 90% stenosed.   RPAV lesion is 90% stenosed.   Dist RCA lesion is 65% stenosed.   Mid Cx lesion is 90% stenosed.   Dist Cx lesion is 95% stenosed.   Ost Cx to Prox Cx lesion is 80% stenosed.   1st Diag lesion is 100% stenosed.   Prox LAD to Mid LAD lesion is 100% stenosed. Severe multivessel native CAD with total occlusion of the previously placed proximal LAD stent; proximal 80% circumflex stenosis immediately before the takeoff of the OM1 vessel with progressive 90 and 95% mid and mid distal stenoses in the circumflex; and large dominant RCA with 30% proximal stenosis, thrombotic stenosis of 90% in the region of the acute margin followed by diffuse irregularity beyond this without visualization of the PDA from the native injection but with 90% distal stenosis proximal to the PDA vessel. Patent LIMA graft supplying the mid LAD. Patent SVG supplying the  first diagonal vessel. Patent SVG supplying the OM1 vessel.  However, there are 90 and 95% stenosis in the AV groove circumflex beyond the OM1 graft. Patent SVG supplying the ostium of the PDA of the RCA. LVEDP 10 mmHg. RECOMMENDATION: Post procedure, the patient developed some aphasia and short-term babbled speech which improved fairly quickly.  A code stroke was activated.  CT of the head was negative  for any large hypodensity abnormality.  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  She underwent successful mechanical thrombectomy with stent retriever and direct contact aspiration with complete recanalization by interventional radiology.  There were no hemorrhagic or thromboembolic complications and she had normalization of symptoms.  Will need to defer initial plan for PCI until timing is appropriate.   IR PERCUTANEOUS ART THROMBECTOMY/INFUSION INTRACRANIAL INC DIAG ANGIO  Result Date: 01/03/2022 INDICATION: 56 year old female with past medical history significant for coronary artery disease status post 4 vessel CABG, hypertension, hyperlipidemia, type 2 diabetes mellitus, GERD, morbid obesity and hypothyroidism. Patient underwent a diagnostic coronary angiography today and was noted to be aphasic postprocedure; NIHSS 6. Her last known well was 10:30 a.m. on 01/03/2022. Head CT showed no acute intracranial abnormality. CT angiogram of the head and neck showed a mid left M2/MCA posterior division branch occlusion. She was then transferred to our service for a diagnostic cerebral angiogram and mechanical thrombectomy. EXAM: ULTRASOUND-GUIDED VASCULAR ACCESS DIAGNOSTIC CEREBRAL ANGIOGRAM MECHANICAL THROMBECTOMY FLAT PANEL HEAD CT COMPARISON:  CT/CT angiogram head and neck January 03, 2022. MEDICATIONS: Refer to anesthesia documentation. ANESTHESIA/SEDATION: The procedure was performed under general anesthesia. CONTRAST:  50 mL of Omnipaque 300  milligram/mL FLUOROSCOPY: Radiation Exposure Index (as provided by the fluoroscopic device): 031 mGy Kerma COMPLICATIONS: None immediate. TECHNIQUE: Informed written consent was obtained from the patient after a thorough discussion of the procedural risks, benefits and alternatives. All questions were addressed. Maximal Sterile Barrier Technique was utilized including caps, mask, sterile gowns, sterile gloves, sterile drape, hand hygiene and skin antiseptic. A timeout was performed prior to the initiation of the procedure. The right groin was prepped and draped in the usual sterile fashion. Using a micropuncture kit and the modified Seldinger technique, access was gained to the right common femoral artery and an 8 French sheath was placed. Real-time ultrasound guidance was utilized for vascular access including the acquisition of a permanent ultrasound image documenting patency of the accessed vessel. Under fluoroscopy, a Zoom 88 guide catheter was navigated over a 6 [place] Berenstein 2 catheter and a 0.035" Terumo Glidewire into the aortic arch. The catheter was placed into the left common carotid artery and then advanced into the left internal carotid artery. The diagnostic catheter was removed. Frontal and lateral angiograms of the head were obtained. FINDINGS: 1. Normal caliber of the right common femoral artery, adequate for vascular access. 2. Occlusion of the mid left M2/MCA posterior division branch. PROCEDURE: Using biplane roadmap, a Zoom 55 aspiration catheter was navigated over Colossus 35 microguidewire into the cavernous segment of the left ICA. The aspiration catheter was then advanced to the distal left M1 segment. Attempt to advance the catheter into the M2/MCA posterior division branch over the Colossus 35 wire proved unsuccessful. A phenom 21 microcatheter was then navigated through the zoom 55 catheter and over an Aristotle 14 microguidewire into the distal left M2/MCA posterior division branch.  Then, a 4 x 40 mm solitaire stent retriever was deployed spanning the M2 segment. The device was allowed to intercalated with the clot for 4 minutes. The microcatheter was removed. The aspiration catheter was advanced to the level of occlusion and connected to an aspiration pump. The thrombectomy device and aspiration catheter were removed under constant aspiration. Left internal carotid artery angiograms with frontal and lateral views of the head showed complete recanalization of the M2 segment with normal flow in the left MCA vascular tree (TICI  3). Flat panel CT of the head was obtained and post processed in a separate workstation with concurrent attending physician supervision. Selected images were sent to PACS. No evidence of hemorrhagic complication. The catheter was subsequently withdrawn. Right common femoral artery angiogram was obtained in right anterior oblique view. The puncture is at the level of the common femoral artery. The artery has normal caliber, adequate for closure device. The sheath was exchanged over the wire for a Perclose prostyle which was utilized for access closure. Immediate hemostasis was achieved. IMPRESSION: 1. Successful mechanical thrombectomy for treatment of a mid left M2/MCA posterior division branch occlusion achieving TICI 3 recanalization. 2. No evidence of thromboembolic or hemorrhagic complication. PLAN: Transfer to ICU for continued post stroke care. Electronically Signed   By: Pedro Earls M.D.   On: 01/03/2022 17:02   IR US Guide Vasc Access Right  Result Date: 01/03/2022 INDICATION: 56 year old female with past medical history significant for coronary artery disease status post 4 vessel CABG, hypertension, hyperlipidemia, type 2 diabetes mellitus, GERD, morbid obesity and hypothyroidism. Patient underwent a diagnostic coronary angiography today and was noted to be aphasic postprocedure; NIHSS 6. Her last known well was 10:30 a.m. on 01/03/2022. Head  CT showed no acute intracranial abnormality. CT angiogram of the head and neck showed a mid left M2/MCA posterior division branch occlusion. She was then transferred to our service for a diagnostic cerebral angiogram and mechanical thrombectomy. EXAM: ULTRASOUND-GUIDED VASCULAR ACCESS DIAGNOSTIC CEREBRAL ANGIOGRAM MECHANICAL THROMBECTOMY FLAT PANEL HEAD CT COMPARISON:  CT/CT angiogram head and neck January 03, 2022. MEDICATIONS: Refer to anesthesia documentation. ANESTHESIA/SEDATION: The procedure was performed under general anesthesia. CONTRAST:  50 mL of Omnipaque 300 milligram/mL FLUOROSCOPY: Radiation Exposure Index (as provided by the fluoroscopic device): 361 mGy Kerma COMPLICATIONS: None immediate. TECHNIQUE: Informed written consent was obtained from the patient after a thorough discussion of the procedural risks, benefits and alternatives. All questions were addressed. Maximal Sterile Barrier Technique was utilized including caps, mask, sterile gowns, sterile gloves, sterile drape, hand hygiene and skin antiseptic. A timeout was performed prior to the initiation of the procedure. The right groin was prepped and draped in the usual sterile fashion. Using a micropuncture kit and the modified Seldinger technique, access was gained to the right common femoral artery and an 8 French sheath was placed. Real-time ultrasound guidance was utilized for vascular access including the acquisition of a permanent ultrasound image documenting patency of the accessed vessel. Under fluoroscopy, a Zoom 88 guide catheter was navigated over a 6 [place] Berenstein 2 catheter and a 0.035" Terumo Glidewire into the aortic arch. The catheter was placed into the left common carotid artery and then advanced into the left internal carotid artery. The diagnostic catheter was removed. Frontal and lateral angiograms of the head were obtained. FINDINGS: 1. Normal caliber of the right common femoral artery, adequate for vascular access. 2.  Occlusion of the mid left M2/MCA posterior division branch. PROCEDURE: Using biplane roadmap, a Zoom 55 aspiration catheter was navigated over Colossus 35 microguidewire into the cavernous segment of the left ICA. The aspiration catheter was then advanced to the distal left M1 segment. Attempt to advance the catheter into the M2/MCA posterior division branch over the Colossus 35 wire proved unsuccessful. A phenom 21 microcatheter was then navigated through the zoom 55 catheter and over an Aristotle 14 microguidewire into the distal left M2/MCA posterior division branch. Then, a 4 x 40 mm solitaire stent retriever was deployed spanning the M2 segment. The device  was allowed to intercalated with the clot for 4 minutes. The microcatheter was removed. The aspiration catheter was advanced to the level of occlusion and connected to an aspiration pump. The thrombectomy device and aspiration catheter were removed under constant aspiration. Left internal carotid artery angiograms with frontal and lateral views of the head showed complete recanalization of the M2 segment with normal flow in the left MCA vascular tree (TICI 3). Flat panel CT of the head was obtained and post processed in a separate workstation with concurrent attending physician supervision. Selected images were sent to PACS. No evidence of hemorrhagic complication. The catheter was subsequently withdrawn. Right common femoral artery angiogram was obtained in right anterior oblique view. The puncture is at the level of the common femoral artery. The artery has normal caliber, adequate for closure device. The sheath was exchanged over the wire for a Perclose prostyle which was utilized for access closure. Immediate hemostasis was achieved. IMPRESSION: 1. Successful mechanical thrombectomy for treatment of a mid left M2/MCA posterior division branch occlusion achieving TICI 3 recanalization. 2. No evidence of thromboembolic or hemorrhagic complication. PLAN:  Transfer to ICU for continued post stroke care. Electronically Signed   By: Pedro Earls M.D.   On: 01/03/2022 17:02   IR CT Head Ltd  Result Date: 01/03/2022 INDICATION: 56 year old female with past medical history significant for coronary artery disease status post 4 vessel CABG, hypertension, hyperlipidemia, type 2 diabetes mellitus, GERD, morbid obesity and hypothyroidism. Patient underwent a diagnostic coronary angiography today and was noted to be aphasic postprocedure; NIHSS 6. Her last known well was 10:30 a.m. on 01/03/2022. Head CT showed no acute intracranial abnormality. CT angiogram of the head and neck showed a mid left M2/MCA posterior division branch occlusion. She was then transferred to our service for a diagnostic cerebral angiogram and mechanical thrombectomy. EXAM: ULTRASOUND-GUIDED VASCULAR ACCESS DIAGNOSTIC CEREBRAL ANGIOGRAM MECHANICAL THROMBECTOMY FLAT PANEL HEAD CT COMPARISON:  CT/CT angiogram head and neck January 03, 2022. MEDICATIONS: Refer to anesthesia documentation. ANESTHESIA/SEDATION: The procedure was performed under general anesthesia. CONTRAST:  50 mL of Omnipaque 300 milligram/mL FLUOROSCOPY: Radiation Exposure Index (as provided by the fluoroscopic device): 578 mGy Kerma COMPLICATIONS: None immediate. TECHNIQUE: Informed written consent was obtained from the patient after a thorough discussion of the procedural risks, benefits and alternatives. All questions were addressed. Maximal Sterile Barrier Technique was utilized including caps, mask, sterile gowns, sterile gloves, sterile drape, hand hygiene and skin antiseptic. A timeout was performed prior to the initiation of the procedure. The right groin was prepped and draped in the usual sterile fashion. Using a micropuncture kit and the modified Seldinger technique, access was gained to the right common femoral artery and an 8 French sheath was placed. Real-time ultrasound guidance was utilized for vascular  access including the acquisition of a permanent ultrasound image documenting patency of the accessed vessel. Under fluoroscopy, a Zoom 88 guide catheter was navigated over a 6 [place] Berenstein 2 catheter and a 0.035" Terumo Glidewire into the aortic arch. The catheter was placed into the left common carotid artery and then advanced into the left internal carotid artery. The diagnostic catheter was removed. Frontal and lateral angiograms of the head were obtained. FINDINGS: 1. Normal caliber of the right common femoral artery, adequate for vascular access. 2. Occlusion of the mid left M2/MCA posterior division branch. PROCEDURE: Using biplane roadmap, a Zoom 55 aspiration catheter was navigated over Colossus 35 microguidewire into the cavernous segment of the left ICA. The aspiration catheter was  then advanced to the distal left M1 segment. Attempt to advance the catheter into the M2/MCA posterior division branch over the Colossus 35 wire proved unsuccessful. A phenom 21 microcatheter was then navigated through the zoom 55 catheter and over an Aristotle 14 microguidewire into the distal left M2/MCA posterior division branch. Then, a 4 x 40 mm solitaire stent retriever was deployed spanning the M2 segment. The device was allowed to intercalated with the clot for 4 minutes. The microcatheter was removed. The aspiration catheter was advanced to the level of occlusion and connected to an aspiration pump. The thrombectomy device and aspiration catheter were removed under constant aspiration. Left internal carotid artery angiograms with frontal and lateral views of the head showed complete recanalization of the M2 segment with normal flow in the left MCA vascular tree (TICI 3). Flat panel CT of the head was obtained and post processed in a separate workstation with concurrent attending physician supervision. Selected images were sent to PACS. No evidence of hemorrhagic complication. The catheter was subsequently  withdrawn. Right common femoral artery angiogram was obtained in right anterior oblique view. The puncture is at the level of the common femoral artery. The artery has normal caliber, adequate for closure device. The sheath was exchanged over the wire for a Perclose prostyle which was utilized for access closure. Immediate hemostasis was achieved. IMPRESSION: 1. Successful mechanical thrombectomy for treatment of a mid left M2/MCA posterior division branch occlusion achieving TICI 3 recanalization. 2. No evidence of thromboembolic or hemorrhagic complication. PLAN: Transfer to ICU for continued post stroke care. Electronically Signed   By: Pedro Earls M.D.   On: 01/03/2022 17:02   CT ANGIO HEAD NECK W WO CM (CODE STROKE)  Result Date: 01/03/2022 CLINICAL DATA:  Neuro deficit, acute, stroke suspected EXAM: CT ANGIOGRAPHY HEAD AND NECK TECHNIQUE: Multidetector CT imaging of the head and neck was performed using the standard protocol during bolus administration of intravenous contrast. Multiplanar CT image reconstructions and MIPs were obtained to evaluate the vascular anatomy. Carotid stenosis measurements (when applicable) are obtained utilizing NASCET criteria, using the distal internal carotid diameter as the denominator. RADIATION DOSE REDUCTION: This exam was performed according to the departmental dose-optimization program which includes automated exposure control, adjustment of the mA and/or kV according to patient size and/or use of iterative reconstruction technique. CONTRAST:  9mL OMNIPAQUE IOHEXOL 350 MG/ML SOLN COMPARISON:  None Available. FINDINGS: CTA NECK Aortic arch: Mild plaque. Great vessel origins are patent. Mixed plaque along the proximal left subclavian without high-grade stenosis. Right carotid system: Patent. Mild calcified plaque at the distal common carotid. No stenosis. Left carotid system: Patent.  No stenosis. Vertebral arteries: Patent and codominant.  No stenosis.  Skeleton: No significant osseous abnormality. Other neck: Unremarkable. Upper chest: No apical lung mass. Review of the MIP images confirms the above findings CTA HEAD Anterior circulation: Intracranial internal carotid arteries are patent with mild calcified plaque. Left M1 MCA is patent. There is occlusion of a left M2 MCA branch approximally 1.5 cm posterior to the bifurcation. Reconstitution more distally. Right middle cerebral artery is patent. Anterior cerebral arteries are patent. Posterior circulation: Intracranial vertebral arteries, basilar artery, and posterior cerebral arteries are patent. Major cerebellar artery origins are patent. Venous sinuses: Patent as allowed by contrast bolus timing. 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 No hemodynamically significant stenosis in the neck. These results were called by telephone at the time  of interpretation on 01/03/2022 at 1:29 pm to provider [facility] , who verbally acknowledged these results. Electronically Signed   By: Macy Mis M.D.   On: 01/03/2022 13:35   CT HEAD CODE STROKE WO CONTRAST  Result Date: 01/03/2022 CLINICAL DATA:  Neuro deficit, acute, stroke suspected EXAM: CT HEAD WITHOUT CONTRAST TECHNIQUE: Contiguous axial images were obtained from the base of the skull through the vertex without intravenous contrast. RADIATION DOSE REDUCTION: This exam was performed according to the departmental dose-optimization program which includes automated exposure control, adjustment of the mA and/or kV according to patient size and/or use of iterative reconstruction technique. COMPARISON:  None Available. FINDINGS: Intravascular contrast is present presumably from cardiac catheterization. Brain: There is no acute intracranial hemorrhage, mass effect, or edema. Gray-white differentiation is preserved. There is no extra-axial fluid collection. Ventricles  and sulci are within normal limits in size and configuration. Vascular: Cannot evaluate for hyperdense vessel due to above. Skull: Calvarium is unremarkable. Sinuses/Orbits: No acute finding. Other: None. IMPRESSION: There is no acute intracranial hemorrhage or evidence of acute infarction. ASPECT score is 10. Residual intravascular contest precludes evaluation for hyperdense vessel. These results were communicated to Dr. Lorrin Goodell at 1:05 pm on 01/03/2022 by text page via the [facility] messaging system. Electronically Signed   By: Macy Mis M.D.   On: 01/03/2022 13:22    Cardiac Studies    Cardiac cath   01/03/2022  Diagnostic Dominance: Right     Patient Profile     56 y.o. female with a past medical history of CAD status post 4v CABG (LIMA to LAD, SVG to diagonal, SVG to OM, SVG to PDA), hypertension, hyperlipidemia, diabetes, GERD, morbid obesity, hypothyroidism who is being seen for the evaluation of chest pain   Assessment & Plan    Unstable angina/NSTEMI:    CAD as above.    Medical management with indefinite DAPT for now.  I will arrange follow up.   CVA:  Per neurology.    HTN:   Controlled.  Continue current therapy.     HLD: Managed through the lipid clinic.    DM: Hemoglobin A1c 7.8.  Per primary team.   For questions or updates, please contact [place] Please consult [email or web address] for contact info under Cardiology/STEMI.   Signed, Minus Breeding, MD  01/05/2022, 7:56 AM

## 2022-01-05 NOTE — Discharge Instructions (Addendum)
DISCHARGE PLAN Aspirin 81 mg daily and clopidogrel (plavix) 75 mg daily for secondary stroke prevention and for your coronary artery disease indefinitely. Follow-up with cardiology. Continue with metoprolol for your HTN  Ongoing stroke risk factor control by Primary Care Physician at time of discharge Follow-up PCP [facility] in 2 weeks. Follow-up in [facility] in 8 weeks, call office to schedule an appointment.  Outpatient OT will call you for follow-up

## 2022-01-05 NOTE — Plan of Care (Signed)
Problem: Education: Goal: Knowledge of General Education information will improve Description: Including pain rating scale, medication(s)/side effects and non-pharmacologic comfort measures Outcome: Progressing   Problem: Health Behavior/Discharge Planning: Goal: Ability to manage health-related needs will improve Outcome: Progressing   Problem: Clinical Measurements: Goal: Ability to maintain clinical measurements within normal limits will improve Outcome: Progressing Goal: Will remain free from infection Outcome: Progressing Goal: Diagnostic test results will improve Outcome: Progressing Goal: Respiratory complications will improve Outcome: Progressing Goal: Cardiovascular complication will be avoided Outcome: Progressing   Problem: Activity: Goal: Risk for activity intolerance will decrease Outcome: Progressing   Problem: Nutrition: Goal: Adequate nutrition will be maintained Outcome: Progressing   Problem: Coping: Goal: Level of anxiety will decrease Outcome: Progressing   Problem: Elimination: Goal: Will not experience complications related to bowel motility Outcome: Progressing Goal: Will not experience complications related to urinary retention Outcome: Progressing   Problem: Pain Managment: Goal: General experience of comfort will improve Outcome: Progressing   Problem: Safety: Goal: Ability to remain free from injury will improve Outcome: Progressing   Problem: Skin Integrity: Goal: Risk for impaired skin integrity will decrease Outcome: Progressing   Problem: Education: Goal: Ability to describe self-care measures that may prevent or decrease complications (Diabetes Survival Skills Education) will improve Outcome: Progressing Goal: Individualized Educational Video(s) Outcome: Progressing   Problem: Coping: Goal: Ability to adjust to condition or change in health will improve Outcome: Progressing   Problem: Fluid Volume: Goal: Ability to  maintain a balanced intake and output will improve Outcome: Progressing   Problem: Health Behavior/Discharge Planning: Goal: Ability to identify and utilize available resources and services will improve Outcome: Progressing Goal: Ability to manage health-related needs will improve Outcome: Progressing   Problem: Metabolic: Goal: Ability to maintain appropriate glucose levels will improve Outcome: Progressing   Problem: Nutritional: Goal: Maintenance of adequate nutrition will improve Outcome: Progressing Goal: Progress toward achieving an optimal weight will improve Outcome: Progressing   Problem: Skin Integrity: Goal: Risk for impaired skin integrity will decrease Outcome: Progressing   Problem: Tissue Perfusion: Goal: Adequacy of tissue perfusion will improve Outcome: Progressing   Problem: Education: Goal: Understanding of CV disease, CV risk reduction, and recovery process will improve Outcome: Progressing Goal: Individualized Educational Video(s) Outcome: Progressing   Problem: Activity: Goal: Ability to return to baseline activity level will improve Outcome: Progressing   Problem: Cardiovascular: Goal: Ability to achieve and maintain adequate cardiovascular perfusion will improve Outcome: Progressing Goal: Vascular access site(s) Level 0-1 will be maintained Outcome: Progressing   Problem: Health Behavior/Discharge Planning: Goal: Ability to safely manage health-related needs after discharge will improve Outcome: Progressing   Problem: Education: Goal: Will demonstrate proper wound care and an understanding of methods to prevent future damage Outcome: Progressing Goal: Knowledge of disease or condition will improve Outcome: Progressing Goal: Knowledge of the prescribed therapeutic regimen will improve Outcome: Progressing Goal: Individualized Educational Video(s) Outcome: Progressing   Problem: Activity: Goal: Risk for activity intolerance will  decrease Outcome: Progressing   Problem: Cardiac: Goal: Will achieve and/or maintain hemodynamic stability Outcome: Progressing   Problem: Clinical Measurements: Goal: Postoperative complications will be avoided or minimized Outcome: Progressing   Problem: Respiratory: Goal: Respiratory status will improve Outcome: Progressing   Problem: Skin Integrity: Goal: Wound healing without signs and symptoms of infection Outcome: Progressing Goal: Risk for impaired skin integrity will decrease Outcome: Progressing   Problem: Urinary Elimination: Goal: Ability to achieve and maintain adequate renal perfusion and functioning will improve Outcome: Progressing   Problem: Education: Goal:  Knowledge of disease or condition will improve Outcome: Progressing Goal: Knowledge of secondary prevention will improve (SELECT ALL) Outcome: Progressing

## 2022-01-05 NOTE — Discharge Summary (Addendum)
Stroke Discharge Summary  Patient ID: [name]   MRN: [id number]      DOB: 1965/11/03  Date of Admission: 12/31/2021 Date of Discharge: 01/05/2022  Attending Physician:  Stroke, Md, MD, Stroke MD Consultant(s):    cardiology  Patient's PCP:  [facility]  DISCHARGE DIAGNOSIS:  Stroke:  left MCA stroke due to embolic left M2 occlusion following elective cardiac catheterization s/p mechanical thrombectomy with resultant TICI3 revascularization Etiology:  embolism s/p LHC cath   Principal Problem:   NSTEMI (non-ST elevated myocardial infarction) [facility]) Aphasia Active Problems:   Diabetes mellitus type 2, insulin dependent ([place])   Hyperlipidemia   Coronary artery disease involving coronary bypass graft of native heart   GERD   Hypothyroidism   HTN (hypertension)   Hx of CABG   Obesity, unspecified   Acute ischemic left MCA stroke (HCC)   Allergies as of 01/05/2022       Reactions   Metformin Anaphylaxis   Penicillins Anaphylaxis, Other (See Comments)   Has patient had a PCN reaction causing immediate rash, facial/tongue/throat swelling, SOB or lightheadedness with hypotension: Yes Has patient had a PCN reaction causing severe rash involving mucus membranes or skin necrosis: No Has patient had a PCN reaction that required hospitalization No Has patient had a PCN reaction occurring within the last 10 years: No If all of the above answers are "NO", then may proceed with Cephalosporin use.   Metoclopramide Other (See Comments)   Reaction:  Nightmares         Medication List     STOP taking these medications    atenolol 25 MG tablet Commonly known as: TENORMIN   cyclobenzaprine 10 MG tablet Commonly known as: FLEXERIL       TAKE these medications    albuterol 108 (90 Base) MCG/ACT inhaler Commonly known as: Ventolin HFA Inhale 2 puffs into the lungs every 6 (six) hours as needed for wheezing or shortness of breath.   ALPRAZolam 0.25 MG  tablet Commonly known as: XANAX Take 0.25 mg by mouth 2 (two) times daily as needed for anxiety or sleep.   ammonium lactate 12 % lotion Commonly known as: AmLactin Apply 1 application topically as needed for dry skin.   aspirin 81 MG chewable tablet Chew 1 tablet (81 mg total) by mouth daily.   Cane Misc 1 standard cane for standing and ambulation.  Diagnosis right knee pain.   ciclopirox 8 % solution Commonly known as: PENLAC APPLY TOPICALLY AT BEDTIME. APPLY OVER NAIL AND SURROUNDING SKIN. APPLY DAILY OVER PREVIOUS COAT. AFTER SEVEN (7) DAYS, MAY REMOVE WITH ALCOHOL AND CONTINUE CYCLE.   clopidogrel 75 MG tablet Commonly known as: PLAVIX Take 1 tablet (75 mg total) by mouth daily. Start taking on: January 06, 2022   furosemide 40 MG tablet Commonly known as: LASIX Take 1 tablet (40 mg total) by mouth daily. Start taking on: January 06, 2022 What changed:  how much to take how to take this when to take this additional instructions   gabapentin 100 MG capsule Commonly known as: NEURONTIN Take 2 capsules (200 mg total) by mouth at bedtime.   ibuprofen 800 MG tablet Commonly known as: ADVIL TAKE 1 TABLET BY MOUTH EVERY 8 HOURS AS NEEDED What changed:  how much to take when to take this reasons to take this   insulin pump Soln Inject into the skin See admin instructions. Pt uses up to 150 units of NOVOLOG daily VIA PUMP  lansoprazole 30 MG disintegrating tablet Commonly known as: Prevacid SoluTab Take 1 tablet (30 mg total) by mouth daily.   levothyroxine 137 MCG tablet Commonly known as: SYNTHROID Take 1 tablet (137 mcg total) by mouth daily before breakfast. Start taking on: January 06, 2022   liraglutide 18 MG/3ML Sopn Commonly known as: VICTOZA Inject 1.8 mg into the skin daily.   Magnesium Oxide -Mg Supplement 500 MG Caps Take 1 capsule by mouth every morning.   metoprolol tartrate 25 MG tablet Commonly known as: LOPRESSOR Take 1 tablet (25 mg total)  by mouth 2 (two) times daily.   montelukast 10 MG tablet Commonly known as: SINGULAIR Take 1 tablet (10 mg total) by mouth daily.   nitroGLYCERIN 0.4 MG SL tablet Commonly known as: NITROSTAT Place 1 tablet (0.4 mg total) under the tongue every 5 (five) minutes x 3 doses as needed for chest pain (if no relief after 2nd dose, proceed to the ED for an evaluation or call 911).   NovoLOG 100 UNIT/ML injection Generic drug: insulin aspart Inject 100-150 Units into the skin as directed. Use up to 100-150 units daily in insulin pump as needed   NYQUIL PO Take 30 mLs by mouth at bedtime as needed (for cough).   oxyCODONE-acetaminophen 5-325 MG tablet Commonly known as: PERCOCET/ROXICET Take 1 tablet by mouth 3 (three) times daily as needed.   Praluent 150 MG/ML Soaj Generic drug: Alirocumab Inject 150 mg into the skin every 14 (fourteen) days.   prednisoLONE 15 MG/5ML Soln Commonly known as: PRELONE Take 15 mg by mouth 2 (two) times daily.   rosuvastatin 20 MG tablet Commonly known as: CRESTOR Take 1 tablet (20 mg total) by mouth daily.   Trelegy Ellipta 100-62.5-25 MCG/ACT Aepb Generic drug: Fluticasone-Umeclidin-Vilant Inhale into the lungs daily.   triamcinolone ointment 0.1 % Commonly known as: KENALOG Apply 1 application topically daily as needed.   Vitamin D3 125 MCG (5000 UT) Caps Take 1 tablet by mouth daily.        LABORATORY STUDIES CBC    Component Value Date/Time   WBC 10.2 01/05/2022 0820   RBC 4.43 01/05/2022 0820   HGB 12.4 01/05/2022 0820   HGB 12.9 03/19/2017 1437   HCT 38.2 01/05/2022 0820   HCT 39.6 03/19/2017 1437   PLT 285 01/05/2022 0820   MCV 86.2 01/05/2022 0820   MCV 84 03/19/2017 1437   MCH 28.0 01/05/2022 0820   MCHC 32.5 01/05/2022 0820   RDW 14.9 01/05/2022 0820   RDW 16.2 (H) 03/19/2017 1437   LYMPHSABS 1.6 04/21/2019 0453   LYMPHSABS 4.8 (H) 03/19/2017 1437   MONOABS 0.6 04/21/2019 0453   EOSABS 0.0 04/21/2019 0453   EOSABS  0.5 (H) 03/19/2017 1437   BASOSABS 0.0 04/21/2019 0453   BASOSABS 0.1 03/19/2017 1437   CMP    Component Value Date/Time   NA 139 01/05/2022 0820   NA 137 06/21/2017 1059   K 3.9 01/05/2022 0820   CL 106 01/05/2022 0820   CO2 26 01/05/2022 0820   GLUCOSE 136 (H) 01/05/2022 0820   BUN 10 01/05/2022 0820   BUN 10 06/21/2017 1059   CREATININE 0.72 01/05/2022 0820   CALCIUM 9.5 01/05/2022 0820   PROT 7.0 01/04/2022 0247   ALBUMIN 3.3 (L) 01/04/2022 0247   AST 20 01/04/2022 0247   ALT 17 01/04/2022 0247   ALKPHOS 137 (H) 01/04/2022 0247   BILITOT 0.7 01/04/2022 0247   GFRNONAA >60 01/05/2022 0820   GFRAA >60 04/21/2019 5697  COAGS Lab Results  Component Value Date   INR 1.4 01/26/2009   INR 0.9 01/24/2009   INR 0.9 01/22/2009   Lipid Panel    Component Value Date/Time   CHOL 169 01/04/2022 0247   CHOL 211 (H) 11/16/2020 1102   TRIG 105 01/04/2022 0247   HDL 56 01/04/2022 0247   HDL 56 11/16/2020 1102   CHOLHDL 3.0 01/04/2022 0247   VLDL 21 01/04/2022 0247   LDLCALC 92 01/04/2022 0247   LDLCALC 133 (H) 11/16/2020 1102   HgbA1C  Lab Results  Component Value Date   HGBA1C 7.8 (H) 12/31/2021   Urinalysis    Component Value Date/Time   COLORURINE YELLOW 04/13/2016 0319   APPEARANCEUR CLEAR 04/13/2016 0319   LABSPEC 1.009 04/13/2016 0319   PHURINE 7.5 04/13/2016 0319   GLUCOSEU NEGATIVE 04/13/2016 0319   HGBUR NEGATIVE 04/13/2016 0319   BILIRUBINUR NEGATIVE 04/13/2016 0319   KETONESUR NEGATIVE 04/13/2016 0319   PROTEINUR NEGATIVE 04/13/2016 0319   UROBILINOGEN 0.2 08/21/2011 1741   NITRITE NEGATIVE 04/13/2016 0319   LEUKOCYTESUR NEGATIVE 04/13/2016 0319   Urine Drug Screen No results found for: "LABOPIA", "COCAINSCRNUR", "LABBENZ", "AMPHETMU", "THCU", "LABBARB"  Alcohol Level No results found for: "ETH"   SIGNIFICANT DIAGNOSTIC STUDIES MR BRAIN WO CONTRAST  Result Date: 01/04/2022 CLINICAL DATA:  Status post thrombectomy. EXAM: MRI HEAD WITHOUT  CONTRAST TECHNIQUE: Multiplanar, multiecho pulse sequences of the brain and surrounding structures were obtained without intravenous contrast. COMPARISON:  CT and CTA 01/03/2022 FINDINGS: Brain: Linear restricted diffusion with ADC correlate extending from the white matter along the atrium of the left lateral ventricle, extending into the posterior left hippocampus (series 2, images 23-30) as well as along the left insula (series 2, images 23-25), with additional smaller foci of restricted diffusion in the anterior left hippocampus (series 2, image 22) and posterior left frontal lobe (series 2, image 42). No acute hemorrhage, mass mass effect, or midline shift. No hydrocephalus or extra-axial collection. No hemosiderin deposition to suggest remote hemorrhage. Vascular: Normal arterial flow voids. Loss of the left sigmoid and jugular flow void is favored to represent slow flow, given contrast opacification on 01/03/2022 CTA. Skull and upper cervical spine: Normal marrow signal. Sinuses/Orbits: No acute finding. Other: Trace fluid in the mastoid air cells. 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 No additional acute intracranial process. Electronically Signed   By: Merilyn Baba M.D.   On: 01/04/2022 02:05   CARDIAC CATHETERIZATION  Result Date: 01/03/2022   Prox RCA lesion is 30% stenosed.   Mid RCA lesion is 90% stenosed.   RPAV lesion is 90% stenosed.   Dist RCA lesion is 65% stenosed.   Mid Cx lesion is 90% stenosed.   Dist Cx lesion is 95% stenosed.   Ost Cx to Prox Cx lesion is 80% stenosed.   1st Diag lesion is 100% stenosed.   Prox LAD to Mid LAD lesion is 100% stenosed. Severe multivessel native CAD with total occlusion of the previously placed proximal LAD stent; proximal 80% circumflex stenosis immediately before the takeoff of the OM1 vessel with progressive 90 and 95% mid and mid  distal stenoses in the circumflex; and large dominant RCA with 30% proximal stenosis, thrombotic stenosis of 90% in the region of the acute margin followed by diffuse irregularity beyond this without visualization of the PDA from the native injection but with 90% distal stenosis proximal to the  PDA vessel. Patent LIMA graft supplying the mid LAD. Patent SVG supplying the first diagonal vessel. Patent SVG supplying the OM1 vessel.  However, there are 90 and 95% stenosis in the AV groove circumflex beyond the OM1 graft. Patent SVG supplying the ostium of the PDA of the RCA. LVEDP 10 mmHg. RECOMMENDATION: Post procedure, the patient developed some aphasia and short-term babbled speech which improved fairly quickly.  A code stroke was activated.  CT of the head was negative for any large hypodensity abnormality.  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  She underwent successful mechanical thrombectomy with stent retriever and direct contact aspiration with complete recanalization by interventional radiology.  There were no hemorrhagic or thromboembolic complications and she had normalization of symptoms.  Will need to defer initial plan for PCI until timing is appropriate.   IR PERCUTANEOUS ART THROMBECTOMY/INFUSION INTRACRANIAL INC DIAG ANGIO  Result Date: 01/03/2022 INDICATION: 56 year old female with past medical history significant for coronary artery disease status post 4 vessel CABG, hypertension, hyperlipidemia, type 2 diabetes mellitus, GERD, morbid obesity and hypothyroidism. Patient underwent a diagnostic coronary angiography today and was noted to be aphasic postprocedure; NIHSS 6. Her last known well was 10:30 a.m. on 01/03/2022. Head CT showed no acute intracranial abnormality. CT angiogram of the head and neck showed a mid left M2/MCA posterior division branch occlusion. She was then transferred to our service for a diagnostic  cerebral angiogram and mechanical thrombectomy. EXAM: ULTRASOUND-GUIDED VASCULAR ACCESS DIAGNOSTIC CEREBRAL ANGIOGRAM MECHANICAL THROMBECTOMY FLAT PANEL HEAD CT COMPARISON:  CT/CT angiogram head and neck January 03, 2022. MEDICATIONS: Refer to anesthesia documentation. ANESTHESIA/SEDATION: The procedure was performed under general anesthesia. CONTRAST:  50 mL of Omnipaque 300 milligram/mL FLUOROSCOPY: Radiation Exposure Index (as provided by the fluoroscopic device): 109 mGy Kerma COMPLICATIONS: None immediate. TECHNIQUE: Informed written consent was obtained from the patient after a thorough discussion of the procedural risks, benefits and alternatives. All questions were addressed. Maximal Sterile Barrier Technique was utilized including caps, mask, sterile gowns, sterile gloves, sterile drape, hand hygiene and skin antiseptic. A timeout was performed prior to the initiation of the procedure. The right groin was prepped and draped in the usual sterile fashion. Using a micropuncture kit and the modified Seldinger technique, access was gained to the right common femoral artery and an 8 French sheath was placed. Real-time ultrasound guidance was utilized for vascular access including the acquisition of a permanent ultrasound image documenting patency of the accessed vessel. Under fluoroscopy, a Zoom 88 guide catheter was navigated over a 6 [place] Berenstein 2 catheter and a 0.035" Terumo Glidewire into the aortic arch. The catheter was placed into the left common carotid artery and then advanced into the left internal carotid artery. The diagnostic catheter was removed. Frontal and lateral angiograms of the head were obtained. FINDINGS: 1. Normal caliber of the right common femoral artery, adequate for vascular access. 2. Occlusion of the mid left M2/MCA posterior division branch. PROCEDURE: Using biplane roadmap, a Zoom 55 aspiration catheter was navigated over Colossus 35 microguidewire into the cavernous segment of  the left ICA. The aspiration catheter was then advanced to the distal left M1 segment. Attempt to advance the catheter into the M2/MCA posterior division branch over the Colossus 35 wire proved unsuccessful. A phenom 21 microcatheter was then navigated through the zoom 55 catheter and over an Aristotle 14 microguidewire into the distal left M2/MCA posterior division branch. Then, a 4 x 40 mm  solitaire stent retriever was deployed spanning the M2 segment. The device was allowed to intercalated with the clot for 4 minutes. The microcatheter was removed. The aspiration catheter was advanced to the level of occlusion and connected to an aspiration pump. The thrombectomy device and aspiration catheter were removed under constant aspiration. Left internal carotid artery angiograms with frontal and lateral views of the head showed complete recanalization of the M2 segment with normal flow in the left MCA vascular tree (TICI 3). Flat panel CT of the head was obtained and post processed in a separate workstation with concurrent attending physician supervision. Selected images were sent to PACS. No evidence of hemorrhagic complication. The catheter was subsequently withdrawn. Right common femoral artery angiogram was obtained in right anterior oblique view. The puncture is at the level of the common femoral artery. The artery has normal caliber, adequate for closure device. The sheath was exchanged over the wire for a Perclose prostyle which was utilized for access closure. Immediate hemostasis was achieved. IMPRESSION: 1. Successful mechanical thrombectomy for treatment of a mid left M2/MCA posterior division branch occlusion achieving TICI 3 recanalization. 2. No evidence of thromboembolic or hemorrhagic complication. PLAN: Transfer to ICU for continued post stroke care. Electronically Signed   By: Pedro Earls M.D.   On: 01/03/2022 17:02   IR US Guide Vasc Access Right  Result Date:  01/03/2022 INDICATION: 56 year old female with past medical history significant for coronary artery disease status post 4 vessel CABG, hypertension, hyperlipidemia, type 2 diabetes mellitus, GERD, morbid obesity and hypothyroidism. Patient underwent a diagnostic coronary angiography today and was noted to be aphasic postprocedure; NIHSS 6. Her last known well was 10:30 a.m. on 01/03/2022. Head CT showed no acute intracranial abnormality. CT angiogram of the head and neck showed a mid left M2/MCA posterior division branch occlusion. She was then transferred to our service for a diagnostic cerebral angiogram and mechanical thrombectomy. EXAM: ULTRASOUND-GUIDED VASCULAR ACCESS DIAGNOSTIC CEREBRAL ANGIOGRAM MECHANICAL THROMBECTOMY FLAT PANEL HEAD CT COMPARISON:  CT/CT angiogram head and neck January 03, 2022. MEDICATIONS: Refer to anesthesia documentation. ANESTHESIA/SEDATION: The procedure was performed under general anesthesia. CONTRAST:  50 mL of Omnipaque 300 milligram/mL FLUOROSCOPY: Radiation Exposure Index (as provided by the fluoroscopic device): 979 mGy Kerma COMPLICATIONS: None immediate. TECHNIQUE: Informed written consent was obtained from the patient after a thorough discussion of the procedural risks, benefits and alternatives. All questions were addressed. Maximal Sterile Barrier Technique was utilized including caps, mask, sterile gowns, sterile gloves, sterile drape, hand hygiene and skin antiseptic. A timeout was performed prior to the initiation of the procedure. The right groin was prepped and draped in the usual sterile fashion. Using a micropuncture kit and the modified Seldinger technique, access was gained to the right common femoral artery and an 8 French sheath was placed. Real-time ultrasound guidance was utilized for vascular access including the acquisition of a permanent ultrasound image documenting patency of the accessed vessel. Under fluoroscopy, a Zoom 88 guide catheter was navigated  over a 6 [place] Berenstein 2 catheter and a 0.035" Terumo Glidewire into the aortic arch. The catheter was placed into the left common carotid artery and then advanced into the left internal carotid artery. The diagnostic catheter was removed. Frontal and lateral angiograms of the head were obtained. FINDINGS: 1. Normal caliber of the right common femoral artery, adequate for vascular access. 2. Occlusion of the mid left M2/MCA posterior division branch. PROCEDURE: Using biplane roadmap, a Zoom 55 aspiration catheter was navigated over Colossus 35  microguidewire into the cavernous segment of the left ICA. The aspiration catheter was then advanced to the distal left M1 segment. Attempt to advance the catheter into the M2/MCA posterior division branch over the Colossus 35 wire proved unsuccessful. A phenom 21 microcatheter was then navigated through the zoom 55 catheter and over an Aristotle 14 microguidewire into the distal left M2/MCA posterior division branch. Then, a 4 x 40 mm solitaire stent retriever was deployed spanning the M2 segment. The device was allowed to intercalated with the clot for 4 minutes. The microcatheter was removed. The aspiration catheter was advanced to the level of occlusion and connected to an aspiration pump. The thrombectomy device and aspiration catheter were removed under constant aspiration. Left internal carotid artery angiograms with frontal and lateral views of the head showed complete recanalization of the M2 segment with normal flow in the left MCA vascular tree (TICI 3). Flat panel CT of the head was obtained and post processed in a separate workstation with concurrent attending physician supervision. Selected images were sent to PACS. No evidence of hemorrhagic complication. The catheter was subsequently withdrawn. Right common femoral artery angiogram was obtained in right anterior oblique view. The puncture is at the level of the common femoral artery. The artery has normal  caliber, adequate for closure device. The sheath was exchanged over the wire for a Perclose prostyle which was utilized for access closure. Immediate hemostasis was achieved. IMPRESSION: 1. Successful mechanical thrombectomy for treatment of a mid left M2/MCA posterior division branch occlusion achieving TICI 3 recanalization. 2. No evidence of thromboembolic or hemorrhagic complication. PLAN: Transfer to ICU for continued post stroke care. Electronically Signed   By: Pedro Earls M.D.   On: 01/03/2022 17:02   IR CT Head Ltd  Result Date: 01/03/2022 INDICATION: 56 year old female with past medical history significant for coronary artery disease status post 4 vessel CABG, hypertension, hyperlipidemia, type 2 diabetes mellitus, GERD, morbid obesity and hypothyroidism. Patient underwent a diagnostic coronary angiography today and was noted to be aphasic postprocedure; NIHSS 6. Her last known well was 10:30 a.m. on 01/03/2022. Head CT showed no acute intracranial abnormality. CT angiogram of the head and neck showed a mid left M2/MCA posterior division branch occlusion. She was then transferred to our service for a diagnostic cerebral angiogram and mechanical thrombectomy. EXAM: ULTRASOUND-GUIDED VASCULAR ACCESS DIAGNOSTIC CEREBRAL ANGIOGRAM MECHANICAL THROMBECTOMY FLAT PANEL HEAD CT COMPARISON:  CT/CT angiogram head and neck January 03, 2022. MEDICATIONS: Refer to anesthesia documentation. ANESTHESIA/SEDATION: The procedure was performed under general anesthesia. CONTRAST:  50 mL of Omnipaque 300 milligram/mL FLUOROSCOPY: Radiation Exposure Index (as provided by the fluoroscopic device): 614 mGy Kerma COMPLICATIONS: None immediate. TECHNIQUE: Informed written consent was obtained from the patient after a thorough discussion of the procedural risks, benefits and alternatives. All questions were addressed. Maximal Sterile Barrier Technique was utilized including caps, mask, sterile gowns, sterile  gloves, sterile drape, hand hygiene and skin antiseptic. A timeout was performed prior to the initiation of the procedure. The right groin was prepped and draped in the usual sterile fashion. Using a micropuncture kit and the modified Seldinger technique, access was gained to the right common femoral artery and an 8 French sheath was placed. Real-time ultrasound guidance was utilized for vascular access including the acquisition of a permanent ultrasound image documenting patency of the accessed vessel. Under fluoroscopy, a Zoom 88 guide catheter was navigated over a 6 [place] Berenstein 2 catheter and a 0.035" Terumo Glidewire into the aortic arch. The catheter  was placed into the left common carotid artery and then advanced into the left internal carotid artery. The diagnostic catheter was removed. Frontal and lateral angiograms of the head were obtained. FINDINGS: 1. Normal caliber of the right common femoral artery, adequate for vascular access. 2. Occlusion of the mid left M2/MCA posterior division branch. PROCEDURE: Using biplane roadmap, a Zoom 55 aspiration catheter was navigated over Colossus 35 microguidewire into the cavernous segment of the left ICA. The aspiration catheter was then advanced to the distal left M1 segment. Attempt to advance the catheter into the M2/MCA posterior division branch over the Colossus 35 wire proved unsuccessful. A phenom 21 microcatheter was then navigated through the zoom 55 catheter and over an Aristotle 14 microguidewire into the distal left M2/MCA posterior division branch. Then, a 4 x 40 mm solitaire stent retriever was deployed spanning the M2 segment. The device was allowed to intercalated with the clot for 4 minutes. The microcatheter was removed. The aspiration catheter was advanced to the level of occlusion and connected to an aspiration pump. The thrombectomy device and aspiration catheter were removed under constant aspiration. Left internal carotid artery  angiograms with frontal and lateral views of the head showed complete recanalization of the M2 segment with normal flow in the left MCA vascular tree (TICI 3). Flat panel CT of the head was obtained and post processed in a separate workstation with concurrent attending physician supervision. Selected images were sent to PACS. No evidence of hemorrhagic complication. The catheter was subsequently withdrawn. Right common femoral artery angiogram was obtained in right anterior oblique view. The puncture is at the level of the common femoral artery. The artery has normal caliber, adequate for closure device. The sheath was exchanged over the wire for a Perclose prostyle which was utilized for access closure. Immediate hemostasis was achieved. IMPRESSION: 1. Successful mechanical thrombectomy for treatment of a mid left M2/MCA posterior division branch occlusion achieving TICI 3 recanalization. 2. No evidence of thromboembolic or hemorrhagic complication. PLAN: Transfer to ICU for continued post stroke care. Electronically Signed   By: Pedro Earls M.D.   On: 01/03/2022 17:02   CT ANGIO HEAD NECK W WO CM (CODE STROKE)  Result Date: 01/03/2022 CLINICAL DATA:  Neuro deficit, acute, stroke suspected EXAM: CT ANGIOGRAPHY HEAD AND NECK TECHNIQUE: Multidetector CT imaging of the head and neck was performed using the standard protocol during bolus administration of intravenous contrast. Multiplanar CT image reconstructions and MIPs were obtained to evaluate the vascular anatomy. Carotid stenosis measurements (when applicable) are obtained utilizing NASCET criteria, using the distal internal carotid diameter as the denominator. RADIATION DOSE REDUCTION: This exam was performed according to the departmental dose-optimization program which includes automated exposure control, adjustment of the mA and/or kV according to patient size and/or use of iterative reconstruction technique. CONTRAST:  49m OMNIPAQUE  IOHEXOL 350 MG/ML SOLN COMPARISON:  None Available. FINDINGS: CTA NECK Aortic arch: Mild plaque. Great vessel origins are patent. Mixed plaque along the proximal left subclavian without high-grade stenosis. Right carotid system: Patent. Mild calcified plaque at the distal common carotid. No stenosis. Left carotid system: Patent.  No stenosis. Vertebral arteries: Patent and codominant.  No stenosis. Skeleton: No significant osseous abnormality. Other neck: Unremarkable. Upper chest: No apical lung mass. Review of the MIP images confirms the above findings CTA HEAD Anterior circulation: Intracranial internal carotid arteries are patent with mild calcified plaque. Left M1 MCA is patent. There is occlusion of a left M2 MCA branch approximally 1.5 cm  posterior to the bifurcation. Reconstitution more distally. Right middle cerebral artery is patent. Anterior cerebral arteries are patent. Posterior circulation: Intracranial vertebral arteries, basilar artery, and posterior cerebral arteries are patent. Major cerebellar artery origins are patent. Venous sinuses: Patent as allowed by contrast bolus timing. 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 No hemodynamically significant stenosis in the neck. These results were called by telephone at the time of interpretation on 01/03/2022 at 1:29 pm to provider [facility] , who verbally acknowledged these results. Electronically Signed   By: Macy Mis M.D.   On: 01/03/2022 13:35   CT HEAD CODE STROKE WO CONTRAST  Result Date: 01/03/2022 CLINICAL DATA:  Neuro deficit, acute, stroke suspected EXAM: CT HEAD WITHOUT CONTRAST TECHNIQUE: Contiguous axial images were obtained from the base of the skull through the vertex without intravenous contrast. RADIATION DOSE REDUCTION: This exam was performed according to the departmental dose-optimization program which includes automated  exposure control, adjustment of the mA and/or kV according to patient size and/or use of iterative reconstruction technique. COMPARISON:  None Available. FINDINGS: Intravascular contrast is present presumably from cardiac catheterization. Brain: There is no acute intracranial hemorrhage, mass effect, or edema. Gray-white differentiation is preserved. There is no extra-axial fluid collection. Ventricles and sulci are within normal limits in size and configuration. Vascular: Cannot evaluate for hyperdense vessel due to above. Skull: Calvarium is unremarkable. Sinuses/Orbits: No acute finding. Other: None. IMPRESSION: There is no acute intracranial hemorrhage or evidence of acute infarction. ASPECT score is 10. Residual intravascular contest precludes evaluation for hyperdense vessel. These results were communicated to Dr. Lorrin Goodell at 1:05 pm on 01/03/2022 by text page via the [facility] messaging system. Electronically Signed   By: Macy Mis M.D.   On: 01/03/2022 13:22   ECHOCARDIOGRAM COMPLETE  Result Date: 01/01/2022    ECHOCARDIOGRAM REPORT   Patient Name:   [name] Date of Exam: 01/01/2022 Medical Rec #:  [id number]        Height:       64.0 in Accession #:    [id number]       Weight:       234.8 lb Date of Birth:  1965-12-21       BSA:          2.094 m Patient Age:    15 years         BP:           131/72 mmHg Patient Gender: F                HR:           77 bpm. Exam Location:  Inpatient Procedure: 2D Echo, Cardiac Doppler, Color Doppler and Intracardiac            Opacification Agent Indications:    Chest pain  History:        Patient has prior history of Echocardiogram examinations, most                 recent 08/15/2017. CHF, Prior CABG; Signs/Symptoms:Edema.  Sonographer:    Merrie Roof RDCS Referring Phys: [place]  1. Left ventricular ejection fraction, by estimation, is 60 to 65%. The left ventricle has normal function. The left ventricle has no regional wall  motion abnormalities. There is mild concentric left ventricular hypertrophy. Left ventricular diastolic parameters are consistent with Grade I diastolic dysfunction (impaired relaxation).  2. Right  ventricular systolic function was not well visualized. The right ventricular size is normal.  3. The mitral valve is normal in structure. Mild mitral valve regurgitation. No evidence of mitral stenosis.  4. The aortic valve is tricuspid. Aortic valve regurgitation is not visualized. Aortic valve sclerosis is present, with no evidence of aortic valve stenosis.  5. The inferior vena cava is normal in size with greater than 50% respiratory variability, suggesting right atrial pressure of 3 mmHg. Comparison(s): Compared to prior echo report in 2019, there is no signfiicant change. FINDINGS  Left Ventricle: Left ventricular ejection fraction, by estimation, is 60 to 65%. The left ventricle has normal function. The left ventricle has no regional wall motion abnormalities. The left ventricular internal cavity size was normal in size. There is  mild concentric left ventricular hypertrophy. Left ventricular diastolic parameters are consistent with Grade I diastolic dysfunction (impaired relaxation). Right Ventricle: The right ventricular size is normal. No increase in right ventricular wall thickness. Right ventricular systolic function was not well visualized. Left Atrium: Left atrial size was normal in size. Right Atrium: Right atrial size was normal in size. Pericardium: There is no evidence of pericardial effusion. Mitral Valve: The mitral valve is normal in structure. There is mild thickening of the mitral valve leaflet(s). Mild mitral valve regurgitation. No evidence of mitral valve stenosis. Tricuspid Valve: The tricuspid valve is normal in structure. Tricuspid valve regurgitation is trivial. Aortic Valve: The aortic valve is tricuspid. Aortic valve regurgitation is not visualized. Aortic valve sclerosis is present, with no  evidence of aortic valve stenosis. Pulmonic Valve: The pulmonic valve was not well visualized. Pulmonic valve regurgitation is trivial. Aorta: The aortic root and ascending aorta are structurally normal, with no evidence of dilitation. Venous: The inferior vena cava is normal in size with greater than 50% respiratory variability, suggesting right atrial pressure of 3 mmHg. IAS/Shunts: The atrial septum is grossly normal.  LEFT VENTRICLE PLAX 2D LVIDd:         4.40 cm   Diastology LVIDs:         3.00 cm   LV e' medial:    8.05 cm/s LV PW:         1.20 cm   LV E/e' medial:  9.4 LV IVS:        1.20 cm   LV e' lateral:   12.60 cm/s LVOT diam:     2.10 cm   LV E/e' lateral: 6.0 LV SV:         62 LV SV Index:   29 LVOT Area:     3.46 cm  RIGHT VENTRICLE RV Basal diam:  3.50 cm LEFT ATRIUM           Index        RIGHT ATRIUM           Index LA diam:      3.70 cm 1.77 cm/m   RA Area:     20.70 cm LA Vol (A4C): 51.8 ml 24.73 ml/m  RA Volume:   55.40 ml  26.45 ml/m  AORTIC VALVE LVOT Vmax:   86.50 cm/s LVOT Vmean:  58.300 cm/s LVOT VTI:    0.178 m  AORTA Ao Root diam: 2.90 cm Ao Asc diam:  2.70 cm MITRAL VALVE MV Area (PHT): 3.99 cm    SHUNTS MV Decel Time: 190 msec    Systemic VTI:  0.18 m MV E velocity: 75.80 cm/s  Systemic Diam: 2.10 cm MV A velocity: 82.10 cm/s MV E/A  ratio:  0.92 Gwyndolyn Kaufman MD Electronically signed by Gwyndolyn Kaufman MD Signature Date/Time: 01/01/2022/5:23:22 PM    Final    DG Chest Portable 1 View  Result Date: 12/31/2021 CLINICAL DATA:  Reflux symptoms for several days, initial encounter EXAM: PORTABLE CHEST 1 VIEW COMPARISON:  04/16/2019 FINDINGS: Cardiac shadow is stable. Postsurgical changes are again seen. The lungs are clear bilaterally. No bony abnormality is noted. IMPRESSION: No active disease. Electronically Signed   By: Inez Catalina M.D.   On: 12/31/2021 01:23      HISTORY OF PRESENT ILLNESS [name] is a 56 y.o. female with history of CAD s/p 4vCABG, HTN,  HLD, DM2, GERD, morbid obesity, hypothyroidism admitted with ACS and underwent LHC cath without any intervention. 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 Residual intravascular contrast precludes evaluation for hyperdense vessel.   CTA head & neck Mid left M2 MCA branch occlusion with reconstitution more distally. Post IR CT No evidence of hemorrhagic complication. MRI  Small acute infarcts in the left hippocampus, left insula, left posterior frontal lobe, and white matter along the atrium of the left lateral ventricle. No additional acute intracranial process 2D Echo 8/13: LVEF 60-65%. Grade I Diastolic dysfunction LV. Atrial septum grossly normal LDL 92 HgbA1c 7.8 No antithrombotic prior to admission (per pt report), now on aspirin 81 mg daily and clopidogrel 75 mg daily for life per discussion with cardiology.  Therapy recommendations:  Outpatient OT follow-up Disposition:  Home  Hypertension Home meds:  metoprolol 71m BID, resumed in hospital Per cardiology, stopped atenolol   Hyperlipidemia Home meds: crestor 225mdaily and praluent, crestor resumed in hospital LDL 92, goal < 30 (per cardiology) Followed by lipid clinic, could not taken an oral statin per her report and elevated LDL was thought to contribute to risk of recurrent events. She was restarted on crestor and started on praluent by lipid clinic.  Continue statin and follow-up with lipid clinic at discharge  Diabetes type II Uncontrolled Home meds:  on insulin pump HgbA1c 7.8, goal < 6 (per cardiology) Continue insulin pump at discharge Continue gabapentin 200 QHS  Unstable  angina / NSTEMI CAD s/p 4vCABG Followed by cardiology (Dr. HoPercival SpanishLast cardiac cath 2014 with patent grafts.  Last nuclear stress test 07/2021, low risk with no ischemia  Was on ASA, IV heparin, BB, statin S/p cardiac cath 8/16 with patient grafts but 90-95% stenosis in AV groove circumflex beyond OM1 graft. Per cardiology, prefer medical management vs. PCI of RCA and distal circ.  Started on ASA+ plavix, discussed with cardiology and patient will stay on DAPT indefinitely Patient also discharged on SLNGT for unstable angina   Other Stroke Risk Factors Denies Cigarette smoker No ETOH use per chart review Obesity, Body mass index is 39.43 kg/m., BMI >/= 30 associated with increased stroke risk, recommend weight loss, diet and exercise as appropriate  Denies Hx stroke/TIA No Family hx stroke per chart review  Other Active Problems GERD Hypothyroidism    DISCHARGE EXAM Blood pressure 125/61, pulse 76, temperature 98.6 F (37 C), temperature source Oral, resp. rate 18, height _0  (1.626 m), weight 104.2 kg, last menstrual period 09/22/2019, SpO2  99 %. Physical Exam  Constitutional: Appears well-developed and well-nourished middle-aged Caucasian lady.   Cardiovascular: Normal rate and regular rhythm.  Respiratory: Effort normal, non-labored breathing   Neuro: Mental Status: Patient is awake, alert, oriented to person, place, month, year. No signs of aphasia or neglect Cranial Nerves: II: Visual Fields are full. Pupils are equal, round, and reactive to light.  III,IV, VI: EOMI without ptosis or diploplia.  V: Facial sensation is symmetric to light touch. VII: Facial movement is symmetric resting and smiling VIII: Hearing is intact to voice X: Palate elevates symmetrically XI: Shoulder shrug is symmetric. XII: Tongue protrudes midline without atrophy or fasciculations.  Motor: Tone is normal. Bulk is normal. 5/5 strength was present in all four extremities.  Diminished fine  finger movements on the right and moderate left or right upper extremity. Sensory: Sensation is symmetric to light touch in the arms and legs.  Cerebellar: FNF and HKS are intact bilaterally.  Discharge Diet       Diet   Diet Heart Room service appropriate? Yes; Fluid consistency: Thin   liquids  DISCHARGE PLAN Disposition:  Home aspirin 81 mg daily and clopidogrel 75 mg daily for secondary stroke prevention and for CAD indefinitely. Follow-up with cardiology.Dr Percival Spanish Ongoing stroke risk factor control by Primary Care Physician at time of discharge Follow-up PCP Center, [facility] in 2 weeks. Follow-up in [facility] in 8 weeks, office to schedule an appointment.   60 minutes were spent preparing discharge.  Rolanda Lundborg, MD, PGY-1   I have personally obtained history,examined this patient, reviewed notes, independently viewed imaging studies, participated in medical decision making and plan of care.ROS completed by me personally and pertinent positives fully documented  I have made any additions or clarifications directly to the above note. Agree with note above.   Antony Contras, MD Medical Director [facility] Pager: [phone] 01/05/2022 1:58 PM

## 2022-01-05 NOTE — Progress Notes (Signed)
Physical Therapy Treatment Patient Details Name: [name] MRN: [id number] DOB: 02-Jan-1966 Today's Date: 01/05/2022   History of Present Illness Pt is a 56 y/o female presenting on 8/12 for burning in her chest. S/P L heart cath and angiography 8/15. Complicated by post procedure aphasia. CT with L MCA stroke, MRI with small acute infarcts in L hippocampus, L insula, L posterior frontal lobe and white matter along the atrium of the L lateral ventricle.S/P thrombectomy 8/15.   PMH includes: CAD with prior stents and CABG x 4 in 2010, obesity, recurrent esophageal strictures, DM2, anxiety, glaucoma, HTN.    PT Comments    Pt progressing towards physical therapy goals. Overall pt mobilizing fairly well, however cognitive deficits are biggest limiting factor. Recommend pt have supervision for tasks with potential safety hazards such as cooking. Pt scored 17/24 on the DGI indicating she is at a higher risk for falls. Noted unsteadiness mostly with head turns and stepping over objects. Will continue to follow and progress as able per POC.    Recommendations for follow up therapy are one component of a multi-disciplinary discharge planning process, led by the attending physician.  Recommendations may be updated based on patient status, additional functional criteria and insurance authorization.  Follow Up Recommendations  No PT follow up     Assistance Recommended at Discharge Intermittent Supervision/Assistance  Patient can return home with the following Help with stairs or ramp for entrance;Assist for transportation;Assistance with cooking/housework;A little help with bathing/dressing/bathroom   Equipment Recommendations  None recommended by PT    Recommendations for Other Services       Precautions / Restrictions Precautions Precautions: Fall Restrictions Weight Bearing Restrictions: No     Mobility  Bed Mobility Overal bed mobility: Modified Independent Bed Mobility: Supine  to Sit     Supine to sit: HOB elevated, Supervision     General bed mobility comments: to/from EOB    Transfers Overall transfer level: Modified independent Equipment used: None Transfers: Sit to/from Stand             General transfer comment: No assist to power up to full stand. Pt without evidence of unsteadiness or LOB.    Ambulation/Gait Ambulation/Gait assistance: Supervision Gait Distance (Feet): 500 Feet Assistive device: None Gait Pattern/deviations: Step-through pattern, Decreased stride length Gait velocity: Decreased Gait velocity interpretation: 1.31 - 2.62 ft/sec, indicative of limited community ambulator   General Gait Details: Mild unsteadiness with head turns and higher level balance activity during DGI but overall without overt LOB.   Stairs Stairs: Yes Stairs assistance: Supervision Stair Management: One rail Left, Alternating pattern, Forwards Number of Stairs: 10 General stair comments: As part of the DGI. Steady pace to ascend but descended step-to pattern 2 plantar fasciitis on the L foot.   Wheelchair Mobility    Modified Rankin (Stroke Patients Only) Modified Rankin (Stroke Patients Only) Pre-Morbid Rankin Score: No significant disability Modified Rankin: Moderately severe disability     Balance Overall balance assessment: Mild deficits observed, not formally tested   Sitting balance-Leahy Scale: Good       Standing balance-Leahy Scale: Fair                   Standardized Balance Assessment Standardized Balance Assessment : Dynamic Gait Index   Dynamic Gait Index Level Surface: Mild Impairment Change in Gait Speed: Normal Gait with Horizontal Head Turns: Mild Impairment Gait with Vertical Head Turns: Mild Impairment Gait and Pivot Turn: Normal Step Over Obstacle: Moderate Impairment Step  Around Obstacles: Normal Steps: Moderate Impairment Total Score: 17      Cognition Arousal/Alertness: Awake/alert Behavior  During Therapy: WFL for tasks assessed/performed Overall Cognitive Status: Impaired/Different from baseline Area of Impairment: Memory, Problem solving, Attention, Awareness, Safety/judgement                   Current Attention Level: Sustained Memory: Decreased short-term memory   Safety/Judgement: Decreased awareness of deficits (cognitive deficits) Awareness: Emergent Problem Solving: Difficulty sequencing, Requires verbal cues, Slow processing          Exercises      General Comments        Pertinent Vitals/Pain Pain Assessment Pain Assessment: No/denies pain    Home Living Family/patient expects to be discharged to:: Private residence Living Arrangements: Spouse/significant other (fiance) Available Help at Discharge: Family Type of Home: Apartment Home Access: Stairs to enter Entrance Stairs-Rails: Doctor, general practice of Steps: 15 (2nd floor)   Home Layout: One level Home Equipment: Agricultural consultant (2 wheels);Cane - single point;Grab bars - tub/shower Additional Comments: fiance works 5am -230pm    Prior Function            PT Goals (current goals can now be found in the care plan section) Acute Rehab PT Goals Patient Stated Goal: to return to independent PT Goal Formulation: With patient Time For Goal Achievement: 01/18/22 Potential to Achieve Goals: Good Progress towards PT goals: Progressing toward goals    Frequency    Min 4X/week      PT Plan Current plan remains appropriate    Co-evaluation              AM-PAC PT "6 Clicks" Mobility   Outcome Measure  Help needed turning from your back to your side while in a flat bed without using bedrails?: None Help needed moving from lying on your back to sitting on the side of a flat bed without using bedrails?: None Help needed moving to and from a bed to a chair (including a wheelchair)?: A Little Help needed standing up from a chair using your arms (e.g., wheelchair or  bedside chair)?: A Little Help needed to walk in hospital room?: A Little Help needed climbing 3-5 steps with a railing? : A Little 6 Click Score: 20    End of Session Equipment Utilized During Treatment: Gait belt Activity Tolerance: Patient tolerated treatment well Patient left: in chair;with call bell/phone within reach;with chair alarm set   PT Visit Diagnosis: Other abnormalities of gait and mobility (R26.89)     Time: 1433-1450 PT Time Calculation (min) (ACUTE ONLY): 17 min  Charges:  $Physical Performance Test: 8-22 mins                     Conni Slipper, PT, DPT Acute Rehabilitation Services Secure Chat Preferred Office: [phone]    Marylynn Pearson 01/05/2022, 3:12 PM

## 2022-01-12 ENCOUNTER — Telehealth: Payer: Self-pay | Admitting: Cardiology

## 2022-01-12 NOTE — Telephone Encounter (Signed)
Pt c/o medication issue:  1. Name of Medication: clopidogrel (PLAVIX) 75 MG tablet  2. How are you currently taking this medication (dosage and times per day)? As prescribed   3. Are you having a reaction (difficulty breathing--STAT)? No  4. What is your medication issue? Pt would like to know if it would be okay to crush this medication up instead of taking whole. Requesting call back.

## 2022-01-12 NOTE — Telephone Encounter (Signed)
Pt informed of providers message. Pt verbalized understanding. No further questions .

## 2022-01-12 NOTE — Telephone Encounter (Signed)
Yes, this medication can be crush.

## 2022-01-13 ENCOUNTER — Telehealth: Payer: Self-pay | Admitting: Cardiology

## 2022-01-13 ENCOUNTER — Other Ambulatory Visit: Payer: Self-pay

## 2022-01-13 MED ORDER — CLOPIDOGREL BISULFATE 75 MG PO TABS: 75.0000 mg | ORAL_TABLET | Freq: Every day | ORAL | 1 refills | Status: DC

## 2022-01-13 NOTE — Telephone Encounter (Signed)
*  STAT* If patient is at the pharmacy, call can be transferred to refill team.   1. Which medications need to be refilled? (please list name of each medication and dose if known) new prescription for Clopidogrel- changing pharmacy so that she is able to swallow the Clopidogrel- unable to swallow the one that she received from the hospital  2. Which pharmacy/location (including street and city if local pharmacy) is medication to be sent to?[facility] [place]  3. Do they need a 30 day or 90 day supply? 90 days and refills

## 2022-01-13 NOTE — Patient Outreach (Signed)
[facility]) Care Management  01/13/2022  [name] [birth year] [id number]    EMMI-STROKE RED ON EMMI ALERT Day # 6 Date: 01/12/2022 Red Alert Reason: "Questions/problems with meds? Yes  Smoked or been around smoke? Yes"   Outreach attempt # 1 to patient. Spoke with patient. She reports she is doing fairly well. Reviewed and addressed red alerts. Patient states that she is having trouble swallowing Plavix pill. She is crushing it and mixing it with applesauce but still not able to get med down. She got this prescription of Plavix from outpatient pharmacy at hospital. When she was on Plavix in the past [facility] and was able to take that brand of Plavix without difficulty.Patient wants to know if she can get script sent to her pharmacy. Advised patient to call MD office and discuss with them. Patient will call office ASAP. Patient voices error in response regarding smoking. As she does not smoke nor has she been around anyone smoking. Advised patient that they would continue to get automated EMMI-Stroke post discharge calls to assess how they are doing following recent hospitalization and will receive a call from a nurse if any of their responses were abnormal. Patient voiced understanding and was appreciative of f/u call.    [name], RN,BSN,CCM [facility] Care Management Telephonic Care Management Coordinator Direct Phone: [phone] Toll Free: [phone] Fax: [phone]

## 2022-01-18 ENCOUNTER — Ambulatory Visit (HOSPITAL_COMMUNITY): Payer: Medicare Other | Admitting: Occupational Therapy

## 2022-01-18 ENCOUNTER — Telehealth: Payer: Self-pay | Admitting: Cardiology

## 2022-01-18 NOTE — Telephone Encounter (Signed)
Pt c/o medication issue:  1. Name of Medication: clopidogrel (PLAVIX) 75 MG tablet  2. How are you currently taking this medication (dosage and times per day)? 1 tablet by mouth crushed up   3. Are you having a reaction (difficulty breathing--STAT)? Yes  4. What is your medication issue? Patient is calling stating when she crushed this tablet and takes it, it causes her head to fill funny and her to have trouble breathing. She is requesting a callback to discuss a possible chewable tablet as an alternative due to her not being able to swallow tablets.

## 2022-01-18 NOTE — Telephone Encounter (Signed)
Spoke to patient she stated she was recently started on Plavix 75 mg daily.Stated she has trouble swallowing pills.She has been crushing Plavix and taking with applesauce.She notices 10 mins after taking she feels light headed and sob for about 1 hour.Stated she wanted to know if there is a alternative medication.I will send message to Dr.Hochrein for advice.

## 2022-01-20 ENCOUNTER — Other Ambulatory Visit: Payer: Self-pay

## 2022-01-20 NOTE — Patient Outreach (Signed)
[facility]) Care Management  01/20/2022  [name] [birth year] [id number]    EMMI-STROKE RED ON EMMI ALERT Day # 13 Date: 01/19/2022. Red Alert Reason: "Went to follow-up appt? No"   Outreach attempt # 1 to patient. Spoke with patient. She denies any acute issues or concerns at present. She reports that she is dissolving med in water to help her to get it don. Reviewed and addressed red alert. Patient has cardiology appt on 01/26/22. She has not called PCP and neuro office to make follow up appt. States she will do ASAP today. Denies any issues with transportation. Patient has completed post discharge automated EMMI-Stroke calls.       Plan: RN CM will close case.  [name], RN,BSN,CCM [facility] Care Management Telephonic Care Management Coordinator Direct Phone: [phone] Toll Free: [phone] Fax: [phone]

## 2022-01-20 NOTE — Telephone Encounter (Signed)
Spoke to patient she stated she is able to take Plavix now.She dissolves tablet in water and takes with no side effects.

## 2022-01-24 NOTE — Progress Notes (Signed)
Date:  01/26/2022   ID:  Kim, [name] 1966/02/08, MRN [id number]  PCP:  [facility]  Cardiologist:   Rollene Rotunda, MD   Chief Complaint  Patient presents with   Coronary Artery Disease    History of Present Illness: [name] is a 56 y.o. female who presents for follow up of coronary artery disease and history of CABG. She underwent coronary angiography in 2014 and all of her bypass grafts were patent at that time. Most recent echocardiogram performed on 06/14/2017 showed normal left ventricular systolic and diastolic function, LVEF 60 to 56%.    She had a stress perfusion study in December that demonstrated no perfusion defects. She was in the hospital last month and had chest pain and she had a cath with disease as listed below.  However, she had a CVA as a result of the cath.   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  She has been rarely getting chest pain.  She says that it is fleeting discomfort.  She has not had taken nitroglycerin.  She thinks she had resolution of the dysarthria and other problems with her stroke.  She is trying to do a little more activity.  She might be slightly more fatigued but it is very hot outside as well.  She is not having any new shortness of breath, PND or orthopnea.  She is not having any neck or arm discomfort.   Past Medical History:  Diagnosis Date   Anxiety    Asthma    Blood transfusion without reported diagnosis    2010 CABG   CAD (coronary artery disease)    4v CABG, 9/10; NL LVF, status post followup Cardiolite November 2012 no ischemia ejection fraction 65%   Cataract    are forming   Diabetes mellitus    Dyslipidemia     LDL 22 mg percent on Crestor   Esophageal stricture    Fatty liver    Gastroparesis due to DM (HCC)    GERD (gastroesophageal  reflux disease)    Glaucoma    Hyperlipidemia    Hypertension    Hypothyroidism    Knee injury, right, initial encounter    Left-sided chest wall pain    Myocardial infarction (HCC) 2010   2008 x 2 stents, 2009 x 2 stents. 01-2009 CABG x 4     Past Surgical History:  Procedure Laterality Date   ANGIOPLASTY     with stenting 2008,2009   CARDIAC SURGERY     CESAREAN SECTION     CORONARY ARTERY BYPASS GRAFT  01/26/2009   2008-2 stents; 2009-2 stents   EYE SURGERY     IR CT HEAD LTD  01/03/2022   IR PERCUTANEOUS ART THROMBECTOMY/INFUSION INTRACRANIAL INC DIAG ANGIO  01/03/2022   IR US GUIDE VASC ACCESS RIGHT  01/03/2022   LEFT HEART CATH AND CORS/GRAFTS ANGIOGRAPHY N/A 01/03/2022   Procedure: LEFT HEART CATH AND CORS/GRAFTS ANGIOGRAPHY;  Surgeon: Lennette Bihari, MD;  [facility]: [facility] INV[facility]IVE CV LAB;  Service: Cardiovascular;  Laterality: N/A;   open heart surgery   2010   RADIOLOGY WITH ANESTHESIA N/A 01/03/2022   Procedure: IR WITH ANESTHESIA;  Surgeon: Radiologist, Medication, MD;  Location: [facility] OR;  Service: Radiology;  Laterality: N/A;   TUBAL LIGATION       Current Outpatient Medications  Medication Sig Dispense  Refill   albuterol (VENTOLIN HFA) 108 (90 Base) MCG/ACT inhaler Inhale 2 puffs into the lungs every 6 (six) hours as needed for wheezing or shortness of breath. 8 g 1   Alirocumab (PRALUENT) 150 MG/ML SOAJ Inject 150 mg into the skin every 14 (fourteen) days. 6 mL 3   ALPRAZolam (XANAX) 0.25 MG tablet Take 0.25 mg by mouth 2 (two) times daily as needed for anxiety or sleep.   1   ammonium lactate (AMLACTIN) 12 % lotion Apply 1 application topically as needed for dry skin. 400 g 0   aspirin 81 MG chewable tablet Chew 1 tablet (81 mg total) by mouth daily. 30 tablet 1   Cholecalciferol (VITAMIN D3) 125 MCG (5000 UT) CAPS Take 1 tablet by mouth daily.     ciclopirox (PENLAC) 8 % solution APPLY TOPICALLY AT BEDTIME. APPLY OVER NAIL AND SURROUNDING SKIN. APPLY DAILY OVER PREVIOUS  COAT. AFTER SEVEN (7) DAYS, MAY REMOVE WITH ALCOHOL AND CONTINUE CYCLE. 6.6 mL 0   clopidogrel (PLAVIX) 75 MG tablet Take 1 tablet (75 mg total) by mouth daily. KEEP UPCOMING APPOINTMENT FOR FUTURE REFILLS. 30 tablet 1   ezetimibe (ZETIA) 10 MG tablet Take 1 tablet (10 mg total) by mouth daily. 90 tablet 3   Fluticasone-Umeclidin-Vilant (TRELEGY ELLIPTA) 100-62.5-25 MCG/INH AEPB Inhale into the lungs daily.     furosemide (LASIX) 40 MG tablet Take 1 tablet (40 mg total) by mouth daily. 30 tablet 1   gabapentin (NEURONTIN) 100 MG capsule Take 2 capsules (200 mg total) by mouth at bedtime. 60 capsule 1   ibuprofen (ADVIL) 800 MG tablet TAKE 1 TABLET BY MOUTH EVERY 8 HOURS AS NEEDED (Patient taking differently: Take 400 mg by mouth daily as needed for moderate pain.) 60 tablet 0   Insulin Human (INSULIN PUMP) SOLN Inject into the skin See admin instructions. Pt uses up to 150 units of NOVOLOG daily VIA PUMP     lansoprazole (PREVACID SOLUTAB) 30 MG disintegrating tablet Take 1 tablet (30 mg total) by mouth daily. 90 tablet 3   levothyroxine (SYNTHROID) 137 MCG tablet Take 1 tablet (137 mcg total) by mouth daily before breakfast. 30 tablet 1   liraglutide (VICTOZA) 18 MG/3ML SOPN Inject 1.8 mg into the skin daily.     Magnesium Oxide 500 MG CAPS Take 1 capsule by mouth every morning.     metoprolol tartrate (LOPRESSOR) 25 MG tablet Take 1 tablet (25 mg total) by mouth 2 (two) times daily. 60 tablet 1   Misc. Devices (CANE) MISC 1 standard cane for standing and ambulation.  Diagnosis right knee pain. 1 each 0   montelukast (SINGULAIR) 10 MG tablet Take 1 tablet (10 mg total) by mouth daily. 30 tablet 1   nitroGLYCERIN (NITROSTAT) 0.4 MG SL tablet Place 1 tablet (0.4 mg total) under the tongue every 5 minutes x 3 doses as needed for chest pain (if no relief after 2nd dose, proceed to the ED/call 911 30 tablet 0   NOVOLOG 100 UNIT/ML injection Inject 100-150 Units into the skin as directed. Use up to  100-150 units daily in insulin pump as needed  5   oxyCODONE-acetaminophen (PERCOCET/ROXICET) 5-325 MG tablet Take 1 tablet by mouth 3 (three) times daily as needed.     prednisoLONE (PRELONE) 15 MG/5ML SOLN Take 15 mg by mouth 2 (two) times daily.     Pseudoeph-Doxylamine-DM-APAP (NYQUIL PO) Take 30 mLs by mouth at bedtime as needed (for cough).     rosuvastatin (CRESTOR) 20 MG tablet  Take 1 tablet (20 mg total) by mouth daily. 30 tablet 1   triamcinolone ointment (KENALOG) 0.1 % Apply 1 application topically daily as needed.     No current facility-administered medications for this visit.    Allergies:   Metformin, Penicillins, and Metoclopramide    ROS:  Please see the history of present illness.   Otherwise, review of systems are positive for none.   All other systems are reviewed and negative.    PHYSICAL EXAM: VS:  BP (!) 120/58   Pulse 64   Ht 5\' 5"  (1.651 m)   Wt 230 lb 9.6 oz (104.6 kg)   LMP 09/22/2019   SpO2 98%   BMI 38.37 kg/m  , BMI Body mass index is 38.37 kg/m. GENERAL:  Well appearing NECK:  No jugular venous distention, waveform within normal limits, carotid upstroke brisk and symmetric, no bruits, no thyromegaly LUNGS:  Clear to auscultation bilaterally CHEST:  Well healed sternotomy scar. HEART:  PMI not displaced or sustained,S1 and S2 within normal limits, no S3, no S4, no clicks, no rubs, no murmurs ABD:  Flat, positive bowel sounds normal in frequency in pitch, no bruits, no rebound, no guarding, no midline pulsatile mass, no hepatomegaly, no splenomegaly EXT:  2 plus pulses throughout, no edema, no cyanosis no clubbing   EKG:  EKG is  ordered today. The ekg ordered today demonstrates sinus rhythm, rate 64, axis within normal limits, intervals within normal limits, no acute ST-T wave changes.   Cath 01/03/2022  Diagnostic Dominance: Right      Recent Labs: 01/04/2022: ALT 17 01/05/2022: BUN 10; Creatinine, Ser 0.72; Hemoglobin 12.4; Magnesium  1.8; Platelets 285; Potassium 3.9; Sodium 139    Lipid Panel    Component Value Date/Time   CHOL 169 01/04/2022 0247   CHOL 211 (H) 11/16/2020 1102   TRIG 105 01/04/2022 0247   HDL 56 01/04/2022 0247   HDL 56 11/16/2020 1102   CHOLHDL 3.0 01/04/2022 0247   VLDL 21 01/04/2022 0247   LDLCALC 92 01/04/2022 0247   LDLCALC 133 (H) 11/16/2020 1102      Wt Readings from Last 3 Encounters:  01/26/22 230 lb 9.6 oz (104.6 kg)  01/03/22 229 lb 11.5 oz (104.2 kg)  08/23/21 244 lb 12.8 oz (111 kg)      Other studies Reviewed: Additional studies/ records that were reviewed today include: Labs. Review of the above records demonstrates:  Please see elsewhere in the note.     ASSESSMENT AND PLAN:   CAD:   She has fleeting discomfort.  At this point I am not considering revascularization given the patent grafts.  If she has continued symptoms I likely would start Imdur or may be Ranexa and only if necessary go after the circumflex distal vessel which is unprotected by bypass graft.   HTN: Her blood pressure is at target.  No change in therapy.   Dyslipidemia:   LDL slightly above target and I am going to add Zetia 10 mg daily.   DM:   She is following with an endocrinologist.  Her A1c's are not at target but better than previous.  CVA: She think she has had complete resolution of her symptoms.  No change in therapy.  She will continue with Plavix and aspirin.   Current medicines are reviewed at length with the patient today.  The patient does not have concerns regarding medicines.  The following changes have been made: None  Labs/ tests ordered today include: None  Orders  Placed This Encounter  Procedures   EKG 12-Lead     Disposition:   FU with in me in 4 months in [place], Rollene Rotunda, MD  01/26/2022 8:15 AM    [facility]

## 2022-01-26 ENCOUNTER — Ambulatory Visit: Payer: Medicare Other | Attending: Cardiology | Admitting: Cardiology

## 2022-01-26 ENCOUNTER — Encounter: Payer: Self-pay | Admitting: Cardiology

## 2022-01-26 VITALS — BP 120/58 | HR 64 | Ht 65.0 in | Wt 230.6 lb

## 2022-01-26 DIAGNOSIS — E118 Type 2 diabetes mellitus with unspecified complications: Secondary | ICD-10-CM | POA: Insufficient documentation

## 2022-01-26 DIAGNOSIS — E785 Hyperlipidemia, unspecified: Secondary | ICD-10-CM | POA: Insufficient documentation

## 2022-01-26 DIAGNOSIS — I63512 Cerebral infarction due to unspecified occlusion or stenosis of left middle cerebral artery: Secondary | ICD-10-CM | POA: Diagnosis not present

## 2022-01-26 DIAGNOSIS — I25810 Atherosclerosis of coronary artery bypass graft(s) without angina pectoris: Secondary | ICD-10-CM | POA: Diagnosis not present

## 2022-01-26 DIAGNOSIS — I10 Essential (primary) hypertension: Secondary | ICD-10-CM | POA: Insufficient documentation

## 2022-01-26 DIAGNOSIS — I63412 Cerebral infarction due to embolism of left middle cerebral artery: Secondary | ICD-10-CM | POA: Insufficient documentation

## 2022-01-26 MED ORDER — EZETIMIBE 10 MG PO TABS: 10.0000 mg | ORAL_TABLET | Freq: Every day | ORAL | 3 refills | Status: DC

## 2022-01-26 NOTE — Patient Instructions (Signed)
Medication Instructions:  Start Zetia 10 mg daily   *If you need a refill on your cardiac medications before your next appointment, please call your pharmacy*   Follow-Up: At [facility], you and your health needs are our priority.  As part of our continuing mission to provide you with exceptional heart care, we have created designated Provider Care Teams.  These Care Teams include your primary Cardiologist (physician) and Advanced Practice Providers (APPs -  Physician Assistants and Nurse Practitioners) who all work together to provide you with the care you need, when you need it.  We recommend signing up for the patient portal called "MyChart".  Sign up information is provided on this After Visit Summary.  MyChart is used to connect with patients for Virtual Visits (Telemedicine).  Patients are able to view lab/test results, encounter notes, upcoming appointments, etc.  Non-urgent messages can be sent to your provider as well.   To learn more about what you can do with MyChart, go to [email or web address].    Your next appointment:   4 month(s)  The format for your next appointment:   In Person  Provider:   Rollene Rotunda, MD  [facility] office)

## 2022-01-31 ENCOUNTER — Telehealth: Payer: Self-pay | Admitting: Cardiology

## 2022-01-31 MED ORDER — CLOPIDOGREL BISULFATE 75 MG PO TABS: 75.0000 mg | ORAL_TABLET | Freq: Every day | ORAL | 3 refills | Status: DC

## 2022-01-31 NOTE — Telephone Encounter (Signed)
*  STAT* If patient is at the pharmacy, call can be transferred to refill team.   1. Which medications need to be refilled? (please list name of each medication and dose if known) clopidogrel (PLAVIX) 75 MG tablet  2. Which pharmacy/location (including street and city if local pharmacy) is medication to be sent to? [id number] - [place], [place] - [address]  3. Do they need a 30 day or 90 day supply? 90

## 2022-02-01 ENCOUNTER — Other Ambulatory Visit: Payer: Self-pay | Admitting: Physician Assistant

## 2022-02-05 ENCOUNTER — Encounter (HOSPITAL_COMMUNITY): Payer: Self-pay | Admitting: *Deleted

## 2022-02-05 ENCOUNTER — Emergency Department (HOSPITAL_COMMUNITY)
Admission: EM | Admit: 2022-02-05 | Discharge: 2022-02-05 | Disposition: A | Discharge status: Home / Self Care | Payer: Medicare Other | Attending: Emergency Medicine | Admitting: Emergency Medicine

## 2022-02-05 ENCOUNTER — Emergency Department (HOSPITAL_COMMUNITY): Payer: Medicare Other

## 2022-02-05 ENCOUNTER — Other Ambulatory Visit: Payer: Self-pay

## 2022-02-05 DIAGNOSIS — Z20822 Contact with and (suspected) exposure to covid-19: Secondary | ICD-10-CM | POA: Insufficient documentation

## 2022-02-05 DIAGNOSIS — K3184 Gastroparesis: Secondary | ICD-10-CM | POA: Insufficient documentation

## 2022-02-05 DIAGNOSIS — J0110 Acute frontal sinusitis, unspecified: Secondary | ICD-10-CM | POA: Diagnosis not present

## 2022-02-05 DIAGNOSIS — Z951 Presence of aortocoronary bypass graft: Secondary | ICD-10-CM | POA: Insufficient documentation

## 2022-02-05 DIAGNOSIS — E1165 Type 2 diabetes mellitus with hyperglycemia: Secondary | ICD-10-CM | POA: Diagnosis not present

## 2022-02-05 DIAGNOSIS — J321 Chronic frontal sinusitis: Secondary | ICD-10-CM

## 2022-02-05 DIAGNOSIS — Z7902 Long term (current) use of antithrombotics/antiplatelets: Secondary | ICD-10-CM | POA: Diagnosis not present

## 2022-02-05 DIAGNOSIS — Z87891 Personal history of nicotine dependence: Secondary | ICD-10-CM | POA: Insufficient documentation

## 2022-02-05 DIAGNOSIS — Z794 Long term (current) use of insulin: Secondary | ICD-10-CM | POA: Insufficient documentation

## 2022-02-05 DIAGNOSIS — Z79899 Other long term (current) drug therapy: Secondary | ICD-10-CM | POA: Insufficient documentation

## 2022-02-05 DIAGNOSIS — I2510 Atherosclerotic heart disease of native coronary artery without angina pectoris: Secondary | ICD-10-CM | POA: Insufficient documentation

## 2022-02-05 DIAGNOSIS — Z7951 Long term (current) use of inhaled steroids: Secondary | ICD-10-CM | POA: Diagnosis not present

## 2022-02-05 DIAGNOSIS — R519 Headache, unspecified: Secondary | ICD-10-CM

## 2022-02-05 DIAGNOSIS — E039 Hypothyroidism, unspecified: Secondary | ICD-10-CM | POA: Diagnosis not present

## 2022-02-05 DIAGNOSIS — E1143 Type 2 diabetes mellitus with diabetic autonomic (poly)neuropathy: Secondary | ICD-10-CM | POA: Diagnosis not present

## 2022-02-05 DIAGNOSIS — I10 Essential (primary) hypertension: Secondary | ICD-10-CM | POA: Insufficient documentation

## 2022-02-05 DIAGNOSIS — Z7982 Long term (current) use of aspirin: Secondary | ICD-10-CM | POA: Insufficient documentation

## 2022-02-05 DIAGNOSIS — J45909 Unspecified asthma, uncomplicated: Secondary | ICD-10-CM | POA: Insufficient documentation

## 2022-02-05 LAB — CBC
HCT: 43.7 % (ref 36.0–46.0)
Hemoglobin: 13.9 g/dL (ref 12.0–15.0)
MCH: 27.6 pg (ref 26.0–34.0)
MCHC: 31.8 g/dL (ref 30.0–36.0)
MCV: 86.7 fL (ref 80.0–100.0)
Platelets: 318 10*3/uL (ref 150–400)
RBC: 5.04 MIL/uL (ref 3.87–5.11)
RDW: 14.2 % (ref 11.5–15.5)
WBC: 8.7 10*3/uL (ref 4.0–10.5)
nRBC: 0 % (ref 0.0–0.2)

## 2022-02-05 LAB — CBG MONITORING, ED: Glucose-Capillary: 165 mg/dL — ABNORMAL HIGH (ref 70–99)

## 2022-02-05 LAB — RESP PANEL BY RT-PCR (FLU A&B, COVID) ARPGX2
Influenza A by PCR: NEGATIVE
Influenza B by PCR: NEGATIVE
SARS Coronavirus 2 by RT PCR: NEGATIVE

## 2022-02-05 LAB — BASIC METABOLIC PANEL
Anion gap: 8 (ref 5–15)
BUN: 8 mg/dL (ref 6–20)
CO2: 27 mmol/L (ref 22–32)
Calcium: 9.7 mg/dL (ref 8.9–10.3)
Chloride: 102 mmol/L (ref 98–111)
Creatinine, Ser: 0.68 mg/dL (ref 0.44–1.00)
GFR, Estimated: >60 mL/min (ref >60)
Glucose, Bld: 181 mg/dL — ABNORMAL HIGH (ref 70–99)
Potassium: 4.1 mmol/L (ref 3.5–5.1)
Sodium: 137 mmol/L (ref 135–145)

## 2022-02-05 MED ORDER — BUTALBITAL-APAP-CAFFEINE 50-325-40 MG PO TABS: 1.0000 | ORAL_TABLET | Freq: Four times a day (QID) | ORAL | 0 refills | Status: DC | PRN

## 2022-02-05 MED ORDER — FLUTICASONE PROPIONATE 50 MCG/ACT NA SUSP: 1.0000 | Freq: Every day | NASAL | 0 refills | Status: AC

## 2022-02-05 MED ORDER — SODIUM CHLORIDE 0.9 % IV BOLUS
500.0000 mL | Freq: Once | INTRAVENOUS | Status: AC
Start: 2022-02-05 — End: 2022-02-05
  Administered 2022-02-05: 500 mL via INTRAVENOUS

## 2022-02-05 MED ORDER — MAGNESIUM SULFATE 2 GM/50ML IV SOLN
2.0000 g | Freq: Once | INTRAVENOUS | Status: AC
  Administered 2022-02-05: 2 g via INTRAVENOUS
  Filled 2022-02-05: qty 50

## 2022-02-05 MED ORDER — SODIUM CHLORIDE 0.9 % IV SOLN
12.5000 mg | Freq: Once | INTRAVENOUS | Status: AC
  Administered 2022-02-05: 12.5 mg via INTRAVENOUS
  Filled 2022-02-05: qty 0.5

## 2022-02-05 MED ORDER — DEXAMETHASONE 4 MG PO TABS
8.0000 mg | ORAL_TABLET | Freq: Once | ORAL | Status: AC
Start: 2022-02-05 — End: 2022-02-05
  Administered 2022-02-05: 8 mg via ORAL
  Filled 2022-02-05: qty 2

## 2022-02-05 MED ORDER — KETOROLAC TROMETHAMINE 15 MG/ML IJ SOLN
15.0000 mg | Freq: Once | INTRAMUSCULAR | Status: AC
  Administered 2022-02-05: 15 mg via INTRAVENOUS
  Filled 2022-02-05: qty 1

## 2022-02-05 MED ORDER — SODIUM CHLORIDE 0.9 % IV BOLUS
1000.0000 mL | Freq: Once | INTRAVENOUS | Status: AC
  Administered 2022-02-05: 1000 mL via INTRAVENOUS

## 2022-02-05 NOTE — ED Provider Notes (Signed)
[facility] EMERGENCY DEPARTMENT Provider Note   CSN: [id number] Arrival date & time: 02/05/22  1010     History {Add pertinent medical, surgical, social history, OB history to HPI:1} Chief Complaint  Patient presents with   Headache    [name] is a 56 y.o. female.  Patient as above with significant medical history as below, including *** who presents to the ED with complaint of ***     Past Medical History:  Diagnosis Date   Anxiety    Asthma    Blood transfusion without reported diagnosis    2010 CABG   CAD (coronary artery disease)    4v CABG, 9/10; NL LVF, status post followup Cardiolite November 2012 no ischemia ejection fraction 65%   Cataract    are forming   Diabetes mellitus    Dyslipidemia     LDL 22 mg percent on Crestor   Esophageal stricture    Fatty liver    Gastroparesis due to DM (HCC)    GERD (gastroesophageal reflux disease)    Glaucoma    Hyperlipidemia    Hypertension    Hypothyroidism    Knee injury, right, initial encounter    Left-sided chest wall pain    Myocardial infarction (HCC) 2010   2008 x 2 stents, 2009 x 2 stents. 01-2009 CABG x 4     Past Surgical History:  Procedure Laterality Date   ANGIOPLASTY     with stenting 2008,2009   CARDIAC SURGERY     CESAREAN SECTION     CORONARY ARTERY BYPASS GRAFT  01/26/2009   2008-2 stents; 2009-2 stents   EYE SURGERY     IR CT HEAD LTD  01/03/2022   IR PERCUTANEOUS ART THROMBECTOMY/INFUSION INTRACRANIAL INC DIAG ANGIO  01/03/2022   IR US GUIDE VASC ACCESS RIGHT  01/03/2022   LEFT HEART CATH AND CORS/GRAFTS ANGIOGRAPHY N/A 01/03/2022   Procedure: LEFT HEART CATH AND CORS/GRAFTS ANGIOGRAPHY;  Surgeon: Lennette Bihari, MD;  [facility]: [facility] INVASIVE CV LAB;  Service: Cardiovascular;  Laterality: N/A;   open heart surgery   2010   RADIOLOGY WITH ANESTHESIA N/A 01/03/2022   Procedure: IR WITH ANESTHESIA;  Surgeon: Radiologist, Medication, MD;  Location: [facility] OR;  Service: Radiology;  Laterality:  N/A;   TUBAL LIGATION       The history is provided by the patient. No language interpreter was used.  Headache      Home Medications Prior to Admission medications   Medication Sig Start Date End Date Taking? Authorizing Provider  albuterol (VENTOLIN HFA) 108 (90 Base) MCG/ACT inhaler Inhale 2 puffs into the lungs every 6 (six) hours as needed for wheezing or shortness of breath. 04/21/19  Yes Lama, Sarina Ill, MD  Alirocumab (PRALUENT) 150 MG/ML SOAJ Inject 150 mg into the skin every 14 (fourteen) days. 08/23/21  Yes Rollene Rotunda, MD  ALPRAZolam Prudy Feeler) 0.25 MG tablet Take 0.25 mg by mouth 2 (two) times daily as needed for anxiety or sleep.  11/21/17  Yes [provider]  aspirin 81 MG chewable tablet Chew 1 tablet (81 mg total) by mouth daily. 01/05/22  Yes Karie Fetch, MD  butalbital-acetaminophen-caffeine [facility]) [phone] MG tablet Take 1-2 tablets by mouth every 6 (six) hours as needed for headache. 02/05/22 02/05/23 Yes Sloan Leiter, DO  Cholecalciferol (VITAMIN D3) 125 MCG (5000 UT) CAPS Take 5,000 Units by mouth daily.   Yes [provider]  clopidogrel (PLAVIX) 75 MG tablet Take 1 tablet (75 mg total) by mouth daily.  01/31/22  Yes Rollene RotundaHochrein, James, MD  ezetimibe (ZETIA) 10 MG tablet Take 1 tablet (10 mg total) by mouth daily. 01/26/22 01/21/23 Yes Rollene RotundaHochrein, James, MD  fluticasone (FLONASE) 50 MCG/ACT nasal spray Place 1 spray into both nostrils daily for 7 days. 02/05/22 02/12/22 Yes Sloan LeiterGray, Netty Sullivant A, DO  Fluticasone-Umeclidin-Vilant (TRELEGY ELLIPTA) 100-62.5-25 MCG/INH AEPB Inhale 2 puffs into the lungs daily.   Yes [provider]  gabapentin (NEURONTIN) 100 MG capsule Take 2 capsules (200 mg total) by mouth at bedtime. 01/05/22  Yes Karie Fetchhien, Stephanie, MD  levothyroxine (SYNTHROID) 137 MCG tablet Take 1 tablet (137 mcg total) by mouth daily before breakfast. 01/06/22  Yes Karie Fetchhien, Stephanie, MD  liraglutide (VICTOZA) 18 MG/3ML SOPN Inject 1.8 mg into the skin  daily.   Yes [provider]  metoprolol tartrate (LOPRESSOR) 25 MG tablet Take 1 tablet (25 mg total) by mouth 2 (two) times daily. 01/05/22  Yes Karie Fetchhien, Stephanie, MD  nitroGLYCERIN (NITROSTAT) 0.4 MG SL tablet Place 1 tablet (0.4 mg total) under the tongue every 5 minutes x 3 doses as needed for chest pain (if no relief after 2nd dose, proceed to the ED/call 911 01/05/22  Yes Karie Fetchhien, Stephanie, MD  NOVOLOG 100 UNIT/ML injection Inject 150 Units into the skin daily. in insulin pump 11/23/17  Yes [provider]  oxyCODONE-acetaminophen (PERCOCET/ROXICET) 5-325 MG tablet Take 1 tablet by mouth 3 (three) times daily as needed.   Yes [provider]  PREVACID SOLUTAB 30 MG disintegrating tablet TAKE 1 TABLET BY MOUTH EVERY DAY 02/01/22  Yes Esterwood, Amy S, PA-C  Pseudoeph-Doxylamine-DM-APAP (NYQUIL PO) Take 30 mLs by mouth at bedtime as needed (for cough).   Yes [provider]  rosuvastatin (CRESTOR) 20 MG tablet Take 1 tablet (20 mg total) by mouth daily. 01/05/22  Yes Karie Fetchhien, Stephanie, MD  triamcinolone ointment (KENALOG) 0.1 % Apply 1 application topically daily as needed. 11/02/20  Yes [provider]  furosemide (LASIX) 40 MG tablet Take 1 tablet (40 mg total) by mouth daily. Patient not taking: Reported on 02/05/2022 01/06/22   Karie Fetchhien, Stephanie, MD  montelukast (SINGULAIR) 10 MG tablet Take 1 tablet (10 mg total) by mouth daily. Patient not taking: Reported on 02/05/2022 01/05/22   Karie Fetchhien, Stephanie, MD      Allergies    Metformin, Penicillins, and Metoclopramide    Review of Systems   Review of Systems  Neurological:  Positive for headaches.    Physical Exam Updated Vital Signs BP 129/62   Pulse 62   Temp 97.9 F (36.6 C) (Oral)   Resp 18   Ht 5\' 5"  (1.651 m)   Wt 104.5 kg   LMP 09/22/2019   SpO2 97%   BMI 38.34 kg/m  Physical Exam  ED Results / Procedures / Treatments   Labs (all labs ordered are listed, but only abnormal results are  displayed) Labs Reviewed  BASIC METABOLIC PANEL - Abnormal; Notable for the following components:      Result Value   Glucose, Bld 181 (*)    All other components within normal limits  CBG MONITORING, ED - Abnormal; Notable for the following components:   Glucose-Capillary 165 (*)    All other components within normal limits  RESP PANEL BY RT-PCR (FLU A&B, COVID) ARPGX2  CBC    EKG None  Radiology CT Head Wo Contrast  Result Date: 02/05/2022 CLINICAL DATA:  Headaches EXAM: CT HEAD WITHOUT CONTRAST TECHNIQUE: Contiguous axial images were obtained from the base of the skull through the  vertex without intravenous contrast. RADIATION DOSE REDUCTION: This exam was performed according to the departmental dose-optimization program which includes automated exposure control, adjustment of the mA and/or kV according to patient size and/or use of iterative reconstruction technique. COMPARISON:  01/03/2022 FINDINGS: Brain: No acute intracranial findings are seen in noncontrast CT brain. There are no signs of bleeding within the cranium. Ventricles are nondilated. Cortical sulci are prominent. Vascular: Scattered arterial calcifications are seen. Skull: Unremarkable. Sinuses/Orbits: Unremarkable. Other: None. IMPRESSION: No acute intracranial findings are seen in noncontrast CT brain. Electronically Signed   By: Elmer Picker M.D.   On: 02/05/2022 12:09    Procedures Procedures  {Document cardiac monitor, telemetry assessment procedure when appropriate:1}  Medications Ordered in ED Medications  dexamethasone (DECADRON) tablet 8 mg (has no administration in time range)  sodium chloride 0.9 % bolus 1,000 mL (0 mLs Intravenous Stopped 02/05/22 1336)  ketorolac (TORADOL) 15 MG/ML injection 15 mg (15 mg Intravenous Given 02/05/22 1335)  magnesium sulfate IVPB 2 g 50 mL (0 g Intravenous Stopped 02/05/22 1440)  promethazine (PHENERGAN) 12.5 mg in sodium chloride 0.9 % 50 mL IVPB (0 mg Intravenous  Stopped 02/05/22 1416)  sodium chloride 0.9 % bolus 500 mL (0 mLs Intravenous Stopped 02/05/22 1444)    ED Course/ Medical Decision Making/ A&P Clinical Course as of 02/05/22 1528  Sun Feb 05, 2022  1036 NIHSS 0 [SG]    Clinical Course User Index [SG] Jeanell Sparrow, DO                           Medical Decision Making Amount and/or Complexity of Data Reviewed Labs: ordered. Radiology: ordered. ECG/medicine tests: ordered.  Risk Prescription drug management.   This patient presents to the ED with chief complaint(s) of *** with pertinent past medical history of *** which further complicates the presenting complaint. The complaint involves an extensive differential diagnosis and also carries with it a high risk of complications and morbidity.    The differential diagnosis includes but not limited to ***. Serious etiologies were considered.   The initial plan is to ***   Additional history obtained: Additional history obtained from {additional history:26846} Records reviewed {records:26847}  Independent labs interpretation:  The following labs were independently interpreted: ***  Independent visualization of imaging: - I independently visualized the following imaging with scope of interpretation limited to determining acute life threatening conditions related to emergency care: ***, which revealed ***  Cardiac monitoring was reviewed and interpreted by myself which shows ***  Treatment and Reassessment: ***  Consultation: - Consulted or discussed management/test interpretation w/ external professional: ***  Consideration for admission or further workup: Admission was considered ***  Social Determinants of health: Social History   Tobacco Use   Smoking status: Former    Packs/day: 1.00    Years: 3.00    Total pack years: 3.00    Types: Cigarettes    Start date: 03/14/1991    Quit date: 05/22/1993    Years since quitting: 28.7   Smokeless tobacco: Never    Tobacco comments:     Year Quit: 1995  Vaping Use   Vaping Use: Never used  Substance Use Topics   Alcohol use: No    Alcohol/week: 0.0 standard drinks of alcohol   Drug use: No      {Document critical care time when appropriate:1} {Document review of labs and clinical decision tools ie heart score, Chads2Vasc2 etc:1}  {Document your independent review of  radiology images, and any outside records:1} {Document your discussion with family members, caretakers, and with consultants:1} {Document social determinants of health affecting pt's care:1} {Document your decision making why or why not admission, treatments were needed:1} Final Clinical Impression(s) / ED Diagnoses Final diagnoses:  Acute nonintractable headache, unspecified headache type  Chronic frontal sinusitis    Rx / DC Orders ED Discharge Orders          Ordered    Ambulatory referral to Neurology       Comments: An appointment is requested in approximately: 1 week   02/05/22 1526    butalbital-acetaminophen-caffeine (FIORICET) 50-325-40 MG tablet  Every 6 hours PRN        02/05/22 1526    fluticasone (FLONASE) 50 MCG/ACT nasal spray  Daily        02/05/22 1527

## 2022-02-05 NOTE — ED Triage Notes (Signed)
Pt c/o headache to right side of head x 3 days; pt had a stroke x 3 weeks ago and is worried she may have another one; pt also c/o ear pain

## 2022-02-05 NOTE — Discharge Instructions (Addendum)
It was a pleasure caring for you today in the emergency department. ? ?Please return to the emergency department for any worsening or worrisome symptoms. ? ? ?Please follow-up with your neurologist ?

## 2022-02-09 ENCOUNTER — Other Ambulatory Visit: Payer: Self-pay | Admitting: Physician Assistant

## 2022-02-28 ENCOUNTER — Ambulatory Visit (INDEPENDENT_AMBULATORY_CARE_PROVIDER_SITE_OTHER): Payer: Medicare Other | Admitting: Diagnostic Neuroimaging

## 2022-02-28 ENCOUNTER — Encounter: Payer: Self-pay | Admitting: Diagnostic Neuroimaging

## 2022-02-28 VITALS — BP 121/66 | HR 68 | Ht 65.0 in | Wt 235.2 lb

## 2022-02-28 DIAGNOSIS — E10649 Type 1 diabetes mellitus with hypoglycemia without coma: Secondary | ICD-10-CM | POA: Diagnosis not present

## 2022-02-28 DIAGNOSIS — I10 Essential (primary) hypertension: Secondary | ICD-10-CM | POA: Diagnosis not present

## 2022-02-28 DIAGNOSIS — I63512 Cerebral infarction due to unspecified occlusion or stenosis of left middle cerebral artery: Secondary | ICD-10-CM | POA: Diagnosis not present

## 2022-02-28 DIAGNOSIS — Z8673 Personal history of transient ischemic attack (TIA), and cerebral infarction without residual deficits: Secondary | ICD-10-CM

## 2022-02-28 NOTE — Progress Notes (Signed)
[facility]  PATIENT: [name] DOB: 13-Mar-1966  REFERRING CLINICIAN: Garvin Fila, MD HISTORY FROM: patient REASON FOR VISIT: new consult   HISTORICAL  CHIEF COMPLAINT:  Chief Complaint  Patient presents with   Stroke    Rm 6 Hospital FU "feeling fine since discharge, no therapies needed"    Headache    "Headache is better now after getting meds in ED"    HISTORY OF PRESENT ILLNESS:   56 year old female here for evaluation of stroke follow-up. History of hypertension, hyperlipidemia, diabetes, obesity, hypothyroidism, coronary artery disease status post CABG x4.  August 2023 was admitted for acute coronary syndrome and underwent cardiac catheterization without intervention.  Postprocedure she was noted to have facial droop and aphasia.  Code stroke was activated.  She was found to have left MCA M2 occlusion.  This was treated with mechanical thrombectomy.  She had an excellent postintervention result.  She is continued on medications.  No residual symptoms.  She is back at home doing well.  REVIEW OF SYSTEMS: Full 14 system review of systems performed and negative with exception of: as per HPI.  ALLERGIES: Allergies  Allergen Reactions   Metformin Anaphylaxis   Penicillins Anaphylaxis and Other (See Comments)    Has patient had a PCN reaction causing immediate rash, facial/tongue/throat swelling, SOB or lightheadedness with hypotension: Yes Has patient had a PCN reaction causing severe rash involving mucus membranes or skin necrosis: No Has patient had a PCN reaction that required hospitalization No Has patient had a PCN reaction occurring within the last 10 years: No If all of the above answers are "NO", then may proceed with Cephalosporin use. Throat swelling   Metoclopramide Other (See Comments)    Reaction:  Nightmares     HOME MEDICATIONS: Outpatient Medications Prior to Visit  Medication Sig Dispense Refill   albuterol (VENTOLIN  HFA) 108 (90 Base) MCG/ACT inhaler Inhale 2 puffs into the lungs every 6 (six) hours as needed for wheezing or shortness of breath. 8 g 1   Alirocumab (PRALUENT) 150 MG/ML SOAJ Inject 150 mg into the skin every 14 (fourteen) days. 6 mL 3   ALPRAZolam (XANAX) 0.5 MG tablet Take 0.25 mg by mouth 2 (two) times daily as needed.     aspirin 81 MG chewable tablet Chew 1 tablet (81 mg total) by mouth daily. 30 tablet 1   butalbital-acetaminophen-caffeine (FIORICET) 50-325-40 MG tablet Take 1-2 tablets by mouth every 6 (six) hours as needed for headache. 20 tablet 0   Cholecalciferol (VITAMIN D3) 125 MCG (5000 UT) CAPS Take 5,000 Units by mouth daily.     clopidogrel (PLAVIX) 75 MG tablet Take 1 tablet (75 mg total) by mouth daily. 90 tablet 3   ezetimibe (ZETIA) 10 MG tablet Take 1 tablet (10 mg total) by mouth daily. 90 tablet 3   fluticasone (FLONASE) 50 MCG/ACT nasal spray Place 1 spray into both nostrils daily for 7 days. 15.8 mL 0   Fluticasone-Umeclidin-Vilant (TRELEGY ELLIPTA) 100-62.5-25 MCG/INH AEPB Inhale 2 puffs into the lungs daily.     furosemide (LASIX) 40 MG tablet Take 1 tablet (40 mg total) by mouth daily. 30 tablet 1   levothyroxine (SYNTHROID) 137 MCG tablet Take 1 tablet (137 mcg total) by mouth daily before breakfast. 30 tablet 1   liraglutide (VICTOZA) 18 MG/3ML SOPN Inject 1.8 mg into the skin daily.     metoprolol tartrate (LOPRESSOR) 25 MG tablet Take 1 tablet (25 mg total) by mouth 2 (two) times daily.  60 tablet 1   montelukast (SINGULAIR) 10 MG tablet Take 1 tablet (10 mg total) by mouth daily. 30 tablet 1   nitroGLYCERIN (NITROSTAT) 0.4 MG SL tablet Place 1 tablet (0.4 mg total) under the tongue every 5 minutes x 3 doses as needed for chest pain (if no relief after 2nd dose, proceed to the ED/call 911 30 tablet 0   NOVOLOG 100 UNIT/ML injection Inject 150 Units into the skin daily. in insulin pump  5   oxyCODONE-acetaminophen (PERCOCET/ROXICET) 5-325 MG tablet Take 1 tablet by  mouth 3 (three) times daily as needed.     predniSONE 5 MG/5ML solution Take 10 mg by mouth 2 (two) times daily.     PREVACID SOLUTAB 30 MG disintegrating tablet TAKE 1 TABLET BY MOUTH EVERY DAY 90 tablet 3   Pseudoeph-Doxylamine-DM-APAP (NYQUIL PO) Take 30 mLs by mouth at bedtime as needed (for cough).     rosuvastatin (CRESTOR) 20 MG tablet Take 1 tablet (20 mg total) by mouth daily. 30 tablet 1   triamcinolone ointment (KENALOG) 0.1 % Apply 1 application topically daily as needed.     ALPRAZolam (XANAX) 0.25 MG tablet Take 0.25 mg by mouth 2 (two) times daily as needed for anxiety or sleep.   1   gabapentin (NEURONTIN) 100 MG capsule Take 2 capsules (200 mg total) by mouth at bedtime. 60 capsule 1   No facility-administered medications prior to visit.    PAST MEDICAL HISTORY: Past Medical History:  Diagnosis Date   Anxiety    Asthma    Blood transfusion without reported diagnosis    2010 CABG   CAD (coronary artery disease)    4v CABG, 9/10; NL LVF, status post followup Cardiolite November 2012 no ischemia ejection fraction 65%   Cataract    are forming   Diabetes mellitus    Dyslipidemia     LDL 22 mg percent on Crestor   Esophageal stricture    Fatty liver    Gastroparesis due to DM ([place])    GERD (gastroesophageal reflux disease)    Glaucoma    Hyperlipidemia    Hypertension    Hypothyroidism    Knee injury, right, initial encounter    Left-sided chest wall pain    Myocardial infarction ([place]) 2010   2008 x 2 stents, 2009 x 2 stents. 01-2009 CABG x 4    Stroke [facility])     PAST SURGICAL HISTORY: Past Surgical History:  Procedure Laterality Date   ANGIOPLASTY     with stenting 2008,2009   CARDIAC SURGERY     CESAREAN SECTION     CORONARY ARTERY BYPASS GRAFT  01/26/2009   2008-2 stents; 2009-2 stents   EYE SURGERY     IR CT HEAD LTD  01/03/2022   IR PERCUTANEOUS ART THROMBECTOMY/INFUSION INTRACRANIAL INC DIAG ANGIO  01/03/2022   IR US GUIDE VASC ACCESS RIGHT   01/03/2022   LEFT HEART CATH AND CORS/GRAFTS ANGIOGRAPHY N/A 01/03/2022   Procedure: LEFT HEART CATH AND CORS/GRAFTS ANGIOGRAPHY;  Surgeon: Troy Sine, MD;  Location: [facility];  Service: Cardiovascular;  Laterality: N/A;   open heart surgery   2010   RADIOLOGY WITH ANESTHESIA N/A 01/03/2022   Procedure: IR WITH ANESTHESIA;  Surgeon: Radiologist, Medication, MD;  Location: [facility];  Service: Radiology;  Laterality: N/A;   TUBAL LIGATION      FAMILY HISTORY: Family History  Problem Relation Age of Onset   Diabetes Mother    Heart disease Father  Asthma Father    COPD Father    Asthma Son    Asthma Paternal Aunt    Colon cancer Neg Hx    Colon polyps Neg Hx    Esophageal cancer Neg Hx    Rectal cancer Neg Hx    Stomach cancer Neg Hx     SOCIAL HISTORY: Social History   Socioeconomic History   Marital status: Single    Spouse name: Not on file   Number of children: 1   Years of education: 12   Highest education level: Not on file  Occupational History   Occupation: disabilied  Tobacco Use   Smoking status: Former    Packs/day: 1.00    Years: 3.00    Total pack years: 3.00    Types: Cigarettes    Start date: 03/14/1991    Quit date: 05/22/1993    Years since quitting: 28.7   Smokeless tobacco: Never   Tobacco comments:     Year Quit: 1995  Vaping Use   Vaping Use: Never used  Substance and Sexual Activity   Alcohol use: No    Alcohol/week: 0.0 standard drinks of alcohol   Drug use: No   Sexual activity: Yes    Birth control/protection: Surgical, Post-menopausal    Comment: tubal  Other Topics Concern   Not on file  Social History Narrative   [facility] (03/19/17):   Originally from [place], [place]. Has always lived in [place]. Previously worked at [employer] She is disabled due to her prior cardiac history. 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 No pets currently. No bird exposure. She reports  she did have mold in a prior home that she lived in for 3 years. She has since moved to a new home, lives alone. She enjoys walking.    Social Determinants of Health   Financial Resource Strain: Medium Risk (12/14/2020)   Overall Financial Resource Strain (CARDIA)    Difficulty of Paying Living Expenses: Somewhat hard  Food Insecurity: Food Insecurity Present (12/14/2020)   Hunger Vital Sign    Worried About Running Out of Food in the Last Year: Sometimes true    Ran Out of Food in the Last Year: Sometimes true  Transportation Needs: No Transportation Needs (12/14/2020)   PRAPARE - Hydrologist (Medical): No    Lack of Transportation (Non-Medical): No  Physical Activity: Insufficiently Active (12/14/2020)   Exercise Vital Sign    Days of Exercise per Week: 3 days    Minutes of Exercise per Session: 30 min  Stress: No Stress Concern Present (12/14/2020)   Echo    Feeling of Stress : Only a little  Social Connections: Moderately Integrated (12/14/2020)   Social Connection and Isolation Panel [NHANES]    Frequency of Communication with Friends and Family: More than three times a week    Frequency of Social Gatherings with Friends and Family: Once a week    Attends Religious Services: More than 4 times per year    Active Member of [employer] or Organizations: Yes    Attends Archivist Meetings: 1 to 4 times per year    Marital Status: Divorced  Intimate Partner Violence: Not At Risk (12/14/2020)   Humiliation, Afraid, Rape, and Kick questionnaire    Fear of Current or Ex-Partner: No    Emotionally Abused: No    Physically Abused: No  Sexually Abused: No     PHYSICAL EXAM  GENERAL EXAM/CONSTITUTIONAL: Vitals:  Vitals:   02/28/22 0905  BP: 121/66  Pulse: 68  Weight: 235 lb 3.2 oz (106.7 kg)  Height: 5\' 5"  (1.651 m)   Body mass index is 39.14 kg/m. Wt Readings from Last 3  Encounters:  02/28/22 235 lb 3.2 oz (106.7 kg)  02/05/22 230 lb 6.1 oz (104.5 kg)  01/26/22 230 lb 9.6 oz (104.6 kg)   Patient is in no distress; well developed, nourished and groomed; neck is supple  CARDIOVASCULAR: Examination of carotid arteries is normal; no carotid bruits Regular rate and rhythm, no murmurs Examination of peripheral vascular system by observation and palpation is normal  EYES: Ophthalmoscopic exam of optic discs and posterior segments is normal; no papilledema or hemorrhages No results found.  MUSCULOSKELETAL: Gait, strength, tone, movements noted in Neurologic exam below  NEUROLOGIC: MENTAL STATUS:      No data to display         awake, alert, oriented to person, place and time recent and remote memory intact normal attention and concentration language fluent, comprehension intact, naming intact fund of knowledge appropriate  CRANIAL NERVE:  2nd - no papilledema on fundoscopic exam 2nd, 3rd, 4th, 6th - pupils equal and reactive to light, visual fields full to confrontation, extraocular muscles intact, no nystagmus 5th - facial sensation symmetric 7th - facial strength symmetric 8th - hearing intact 9th - palate elevates symmetrically, uvula midline 11th - shoulder shrug symmetric 12th - tongue protrusion midline  MOTOR:  normal bulk and tone, full strength in the BUE, BLE  SENSORY:  normal and symmetric to light touch  COORDINATION:  finger-nose-finger, fine finger movements normal  REFLEXES:  deep tendon reflexes present and symmetric  GAIT/STATION:  narrow based gait     DIAGNOSTIC DATA (LABS, IMAGING, TESTING) - I reviewed patient records, labs, notes, testing and imaging myself where available.  Lab Results  Component Value Date   WBC 8.7 02/05/2022   HGB 13.9 02/05/2022   HCT 43.7 02/05/2022   MCV 86.7 02/05/2022   PLT 318 02/05/2022      Component Value Date/Time   NA 137 02/05/2022 1036   NA 137 06/21/2017 1059    K 4.1 02/05/2022 1036   CL 102 02/05/2022 1036   CO2 27 02/05/2022 1036   GLUCOSE 181 (H) 02/05/2022 1036   BUN 8 02/05/2022 1036   BUN 10 06/21/2017 1059   CREATININE 0.68 02/05/2022 1036   CALCIUM 9.7 02/05/2022 1036   PROT 7.0 01/04/2022 0247   ALBUMIN 3.3 (L) 01/04/2022 0247   AST 20 01/04/2022 0247   ALT 17 01/04/2022 0247   ALKPHOS 137 (H) 01/04/2022 0247   BILITOT 0.7 01/04/2022 0247   GFRNONAA >60 02/05/2022 1036   GFRAA >60 04/21/2019 0453   Lab Results  Component Value Date   CHOL 169 01/04/2022   HDL 56 01/04/2022   LDLCALC 92 01/04/2022   TRIG 105 01/04/2022   CHOLHDL 3.0 01/04/2022   Lab Results  Component Value Date   HGBA1C 7.8 (H) 12/31/2021   No results found for: "VITAMINB12" Lab Results  Component Value Date   TSH 2.11 06/12/2012   HOSPITAL WORKUP Code Stroke CT head: there is no acute intracranial hemorrhage or evidence of acute infarction. Residual intravascular contrast precludes evaluation for hyperdense vessel.   CTA head & neck Mid left M2 MCA branch occlusion with reconstitution more distally. Post IR CT No evidence of hemorrhagic complication. MRI  Small  acute infarcts in the left hippocampus, left insula, left posterior frontal lobe, and white matter along the atrium of the left lateral ventricle. No additional acute intracranial process 2D Echo 8/13: LVEF 60-65%. Grade I Diastolic dysfunction LV. Atrial septum grossly normal LDL 92 HgbA1c 7.8   ASSESSMENT AND PLAN  56 y.o. year old female here with:   Dx:  1. Chronic ischemic left MCA stroke   2. Uncontrolled type 1 diabetes mellitus with hypoglycemia without coma, with long-term current use of insulin ([place])   3. Primary hypertension       PLAN:    HOSPITAL COURSE Stroke:  left MCA stroke due to embolic left M2 occlusion following elective cardiac catheterization s/p mechanical thrombectomy with resultant TICI3 revascularization Etiology:  embolism s/p LHC cath  No  antithrombotic prior to admission (per pt report), now on aspirin 81 mg daily and clopidogrel 75 mg daily for life per discussion with cardiology.    Hypertension Home meds:  metoprolol 25mg  BID, resumed in hospital Per cardiology, stopped atenolol    Hyperlipidemia Home meds: crestor 20mg  daily and praluent, crestor resumed in hospital LDL 92, goal < 30 (per cardiology) Followed by lipid clinic, could not taken an oral statin per her report and elevated LDL was thought to contribute to risk of recurrent events. She was restarted on crestor and started on praluent by lipid clinic.  Continue statin and follow-up with lipid clinic at discharge   Diabetes type II Uncontrolled Home meds:  on insulin pump HgbA1c 7.8, goal < 6 (per cardiology) Continue insulin pump at discharge Stop gabapentin (patient not taking; not needing for nerve pain currently)   Other Stroke Risk Factors Denies Cigarette smoker No ETOH use per chart review Obesity, Body mass index is 39.43 kg/m., BMI >/= 30 associated with increased stroke risk, recommend weight loss, diet and exercise as appropriate  Last sleep [facility]; was negative for OSA)  Return for return to PCP.    Penni Bombard, MD [id number], [id number] AM Certified in Neurology, Neurophysiology and Neuroimaging  [facility] [address] [place], [place] [address] [phone]

## 2022-03-06 ENCOUNTER — Telehealth: Payer: Self-pay | Admitting: Cardiology

## 2022-03-06 MED ORDER — METOPROLOL TARTRATE 25 MG PO TABS: 25.0000 mg | ORAL_TABLET | Freq: Two times a day (BID) | ORAL | 8 refills | Status: DC

## 2022-03-06 NOTE — Telephone Encounter (Signed)
*  STAT* If patient is at the pharmacy, call can be transferred to refill team.   1. Which medications need to be refilled? (please list name of each medication and dose if known)  metoprolol tartrate (LOPRESSOR) 25 MG tablet  2. Which pharmacy/location (including street and city if local pharmacy) is medication to be sent to? [id number] - [place], [place] - [place]  3. Do they need a 30 day or 90 day supply? 90 day supply   Patient is completely out of medication.

## 2022-03-12 ENCOUNTER — Other Ambulatory Visit: Payer: Self-pay | Admitting: Physician Assistant

## 2022-04-12 ENCOUNTER — Other Ambulatory Visit: Payer: Self-pay

## 2022-04-12 NOTE — Patient Outreach (Signed)
Telephone outreach to patient to obtain mRS was successfully completed. MRS=1  [name] [facility] Care Management Assistant [phone]  

## 2022-05-02 ENCOUNTER — Telehealth: Payer: Self-pay | Admitting: Cardiology

## 2022-05-02 MED ORDER — CLOPIDOGREL BISULFATE 75 MG PO TABS: 75.0000 mg | ORAL_TABLET | Freq: Every day | ORAL | 3 refills | Status: DC

## 2022-05-02 NOTE — Telephone Encounter (Signed)
*  STAT* If patient is at the pharmacy, call can be transferred to refill team.   1. Which medications need to be refilled? (please list name of each medication and dose if known) clopidogrel (PLAVIX) 75 MG tablet   2. Which pharmacy/location (including street and city if local pharmacy) is medication to be sent to?   [id number] - [place], [place] - [address]    3. Do they need a 30 day or 90 day supply? 90

## 2022-05-27 NOTE — Progress Notes (Unsigned)
Date:  05/29/2022   ID:  [name], [place] 24-Feb-1966, MRN [id number]  PCP:  [facility]  Cardiologist:   Rollene Rotunda, MD   Chief Complaint  Patient presents with   Coronary Artery Disease   Chest Pain    History of Present Illness: [name] is a 57 y.o. female who presents for follow up of coronary artery disease and history of CABG. She underwent coronary angiography in 2014 and all of her bypass grafts were patent at that time.  She was in the hospital in [place] had chest pain and she had a cath with disease as listed below.  However, she had a CVA as a result of the cath.   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  She thinks that she recovered completely from her stroke.  She does have some exertional chest discomfort but she says this has been since her bypass.  She might get a little burning when she exerts herself but it has been very stable and goes away after seconds.  She has some other fleeting resting chest discomfort but she says this is very mild and she has not had to take any nitroglycerin.  She has not some walking for exercise.  She has not been able to bring on any symptoms with this.  She takes care of her mom.  The patient lives in an apartment and goes up and down stairs.  She is not having any new shortness of breath, PND or orthopnea.  She is not having any new palpitations, presyncope or syncope.  She has had a little weight gain.  Past Medical History:  Diagnosis Date   Anxiety    Asthma    Blood transfusion without reported diagnosis    2010 CABG   CAD (coronary artery disease)    4v CABG, 9/10; NL LVF, status post followup Cardiolite November 2012 no ischemia ejection fraction 65%   Cataract    are forming   Diabetes mellitus    Dyslipidemia     LDL 22 mg percent on  Crestor   Esophageal stricture    Fatty liver    Gastroparesis due to DM (HCC)    GERD (gastroesophageal reflux disease)    Glaucoma    Hyperlipidemia    Hypertension    Hypothyroidism    Knee injury, right, initial encounter    Left-sided chest wall pain    Myocardial infarction (HCC) 2010   2008 x 2 stents, 2009 x 2 stents. 01-2009 CABG x 4    Stroke [facility])     Past Surgical History:  Procedure Laterality Date   ANGIOPLASTY     with stenting 2008,2009   CARDIAC SURGERY     CESAREAN SECTION     CORONARY ARTERY BYPASS GRAFT  01/26/2009   2008-2 stents; 2009-2 stents   EYE SURGERY     IR CT HEAD LTD  01/03/2022   IR PERCUTANEOUS ART THROMBECTOMY/INFUSION INTRACRANIAL INC DIAG ANGIO  01/03/2022   IR US GUIDE VASC ACCESS RIGHT  01/03/2022   LEFT HEART CATH AND CORS/GRAFTS ANGIOGRAPHY N/A 01/03/2022   Procedure: LEFT HEART CATH AND CORS/GRAFTS ANGIOGRAPHY;  Surgeon: Lennette Bihari, MD;  Location: [facility];  Service: Cardiovascular;  Laterality: N/A;   open heart surgery   2010   RADIOLOGY WITH ANESTHESIA N/A 01/03/2022   Procedure:  IR WITH ANESTHESIA;  Surgeon: Radiologist, Medication, MD;  Location: [facility];  Service: Radiology;  Laterality: N/A;   TUBAL LIGATION       Current Outpatient Medications  Medication Sig Dispense Refill   albuterol (VENTOLIN HFA) 108 (90 Base) MCG/ACT inhaler Inhale 2 puffs into the lungs every 6 (six) hours as needed for wheezing or shortness of breath. 8 g 1   Alirocumab (PRALUENT) 150 MG/ML SOAJ Inject 150 mg into the skin every 14 (fourteen) days. 6 mL 3   ALPRAZolam (XANAX) 0.5 MG tablet Take 0.25 mg by mouth 2 (two) times daily as needed.     aspirin 81 MG chewable tablet Chew 1 tablet (81 mg total) by mouth daily. 30 tablet 1   Cholecalciferol (VITAMIN D3) 125 MCG (5000 UT) CAPS Take 5,000 Units by mouth daily.     clopidogrel (PLAVIX) 75 MG tablet Take 1 tablet (75 mg total) by mouth daily. 90 tablet 3   ezetimibe (ZETIA) 10 MG tablet  Take 1 tablet (10 mg total) by mouth daily. 90 tablet 3   Fluticasone-Umeclidin-Vilant (TRELEGY ELLIPTA) 100-62.5-25 MCG/INH AEPB Inhale 2 puffs into the lungs daily.     furosemide (LASIX) 40 MG tablet Take 1 tablet (40 mg total) by mouth daily. 30 tablet 1   levothyroxine (SYNTHROID) 137 MCG tablet Take 1 tablet (137 mcg total) by mouth daily before breakfast. 30 tablet 1   liraglutide (VICTOZA) 18 MG/3ML SOPN Inject 1.8 mg into the skin daily.     metoprolol tartrate (LOPRESSOR) 25 MG tablet Take 1 tablet (25 mg total) by mouth 2 (two) times daily. 60 tablet 8   montelukast (SINGULAIR) 10 MG tablet Take 1 tablet (10 mg total) by mouth daily. 30 tablet 1   nitroGLYCERIN (NITROSTAT) 0.4 MG SL tablet Place 1 tablet (0.4 mg total) under the tongue every 5 minutes x 3 doses as needed for chest pain (if no relief after 2nd dose, proceed to the ED/call 911 30 tablet 0   NOVOLOG 100 UNIT/ML injection Inject 150 Units into the skin daily. in insulin pump  5   oxyCODONE-acetaminophen (PERCOCET/ROXICET) 5-325 MG tablet Take 1 tablet by mouth 3 (three) times daily as needed.     PREVACID SOLUTAB 30 MG disintegrating tablet TAKE 1 TABLET BY MOUTH EVERY DAY 30 tablet 8   Pseudoeph-Doxylamine-DM-APAP (NYQUIL PO) Take 30 mLs by mouth at bedtime as needed (for cough).     triamcinolone ointment (KENALOG) 0.1 % Apply 1 application topically daily as needed.     fluticasone (FLONASE) 50 MCG/ACT nasal spray Place 1 spray into both nostrils daily for 7 days. 15.8 mL 0   No current facility-administered medications for this visit.    Allergies:   Metformin, Penicillins, and Metoclopramide    ROS:  Please see the history of present illness.   Otherwise, review of systems are positive for none.   All other systems are reviewed and negative.    PHYSICAL EXAM: VS:  BP 130/60   Pulse 69   Ht 5\' 4"  (1.626 m)   Wt 241 lb (109.3 kg)   LMP 09/22/2019   SpO2 99%   BMI 41.37 kg/m  , BMI Body mass index is 41.37  kg/m. GENERAL:  Well appearing NECK:  No jugular venous distention, waveform within normal limits, carotid upstroke brisk and symmetric, no bruits, no thyromegaly LUNGS:  Clear to auscultation bilaterally CHEST:  Well healed sternotomy scar. HEART:  PMI not displaced or sustained,S1 and S2 within normal limits, no S3,  no S4, no clicks, no rubs, no murmurs ABD:  Flat, positive bowel sounds normal in frequency in pitch, no bruits, no rebound, no guarding, no midline pulsatile mass, no hepatomegaly, no splenomegaly EXT:  2 plus pulses throughout, no edema, no cyanosis no clubbing   EKG:  EKG is  ordered today.   Cath 01/03/2022  Diagnostic Dominance: Right      Recent Labs: 01/04/2022: ALT 17 01/05/2022: Magnesium 1.8 02/05/2022: BUN 8; Creatinine, Ser 0.68; Hemoglobin 13.9; Platelets 318; Potassium 4.1; Sodium 137    Lipid Panel    Component Value Date/Time   CHOL 169 01/04/2022 0247   CHOL 211 (H) 11/16/2020 1102   TRIG 105 01/04/2022 0247   HDL 56 01/04/2022 0247   HDL 56 11/16/2020 1102   CHOLHDL 3.0 01/04/2022 0247   VLDL 21 01/04/2022 0247   LDLCALC 92 01/04/2022 0247   LDLCALC 133 (H) 11/16/2020 1102      Wt Readings from Last 3 Encounters:  05/29/22 241 lb (109.3 kg)  02/28/22 235 lb 3.2 oz (106.7 kg)  02/05/22 230 lb 6.1 oz (104.5 kg)      Other studies Reviewed: Additional studies/ records that were reviewed today include: Labs. Review of the above records demonstrates:  Please see elsewhere in the note.     ASSESSMENT AND PLAN:   CAD:   The patient has no new sypmtoms.  No further cardiovascular testing is indicated.  We will continue with aggressive risk reduction and meds as listed.  HTN: Her blood pressure is at target.  No change in therapy.   Dyslipidemia:   LDL is 92 but I believe this was prior to adding Zetia.  I will have her come back for fasting lipid profile.   DM:   She is following with an endocrinologist.  Her A1c is 7.8.   CVA:    She had complete resolution.  No change in therapy.  I will continue Plavix and aspirin since we are maximizing medical therapy.  PREOP: The patient needs colonoscopy and I told her she could hold her Plavix for this.   Current medicines are reviewed at length with the patient today.  The patient does not have concerns regarding medicines.  The following changes have been made: None  Labs/ tests ordered today include:   Orders Placed This Encounter  Procedures   Lipid panel     Disposition:   FU with in me in 6  months.     Signed, Rollene Rotunda, MD  05/29/2022 10:27 AM    [facility]

## 2022-05-29 ENCOUNTER — Encounter: Payer: Self-pay | Admitting: Cardiology

## 2022-05-29 ENCOUNTER — Ambulatory Visit: Payer: Medicare Other | Attending: Cardiology | Admitting: Cardiology

## 2022-05-29 VITALS — BP 130/60 | HR 69 | Ht 64.0 in | Wt 241.0 lb

## 2022-05-29 DIAGNOSIS — E785 Hyperlipidemia, unspecified: Secondary | ICD-10-CM | POA: Diagnosis not present

## 2022-05-29 DIAGNOSIS — I25708 Atherosclerosis of coronary artery bypass graft(s), unspecified, with other forms of angina pectoris: Secondary | ICD-10-CM | POA: Diagnosis not present

## 2022-05-29 DIAGNOSIS — I10 Essential (primary) hypertension: Secondary | ICD-10-CM | POA: Insufficient documentation

## 2022-05-29 NOTE — Patient Instructions (Signed)
   Follow-Up: At [facility], you and your health needs are our priority.  As part of our continuing mission to provide you with exceptional heart care, we have created designated Provider Care Teams.  These Care Teams include your primary Cardiologist (physician) and Advanced Practice Providers (APPs -  Physician Assistants and Nurse Practitioners) who all work together to provide you with the care you need, when you need it.  We recommend signing up for the patient portal called "MyChart".  Sign up information is provided on this After Visit Summary.  MyChart is used to connect with patients for Virtual Visits (Telemedicine).  Patients are able to view lab/test results, encounter notes, upcoming appointments, etc.  Non-urgent messages can be sent to your provider as well.   To learn more about what you can do with MyChart, go to [email or web address].    Your next appointment:   6 month(s)  The format for your next appointment:   In Person  Provider:   James Hochrein, MD           

## 2022-06-28 ENCOUNTER — Other Ambulatory Visit (HOSPITAL_COMMUNITY): Payer: Self-pay

## 2022-06-30 ENCOUNTER — Other Ambulatory Visit (HOSPITAL_COMMUNITY): Payer: Self-pay

## 2022-06-30 ENCOUNTER — Telehealth: Payer: Self-pay

## 2022-06-30 MED ORDER — REPATHA SURECLICK 140 MG/ML ~~LOC~~ SOAJ: 140.0000 mg | SUBCUTANEOUS | 3 refills | Status: DC

## 2022-06-30 NOTE — Telephone Encounter (Signed)
Pharmacy Patient Advocate Encounter  Prior Authorization for Repatha 140MG/ML has been approved.    key# [id number] Effective dates: 1.1.24 through 2.8.25  Please note that Rx Praluent is no longer on the pts Formulary, and A new script for Repatha will need to be sent into the preferred pharmacy.

## 2022-06-30 NOTE — Telephone Encounter (Signed)
Spoke with patient, she is aware of change.  Rx sent to pharmacy

## 2022-07-20 ENCOUNTER — Other Ambulatory Visit: Payer: Self-pay

## 2022-07-20 ENCOUNTER — Encounter: Payer: Self-pay | Admitting: Radiology

## 2022-07-20 ENCOUNTER — Telehealth: Payer: Self-pay | Admitting: Physician Assistant

## 2022-07-20 MED ORDER — LANSOPRAZOLE 30 MG PO TBDD: 30.0000 mg | DELAYED_RELEASE_TABLET | Freq: Every day | ORAL | 2 refills | Status: DC

## 2022-07-20 NOTE — Telephone Encounter (Signed)
Inbound call from pt requesting a refill for lansoprazole.Please advise

## 2022-07-20 NOTE — Telephone Encounter (Signed)
Last office visit 08/22/2021 with Nicoletta Ba, PA-C for chronic GERD on anticoagulation, history of gastroparesis and history of esophageal dilations. Recall EGD and colonoscopy is for 09/2022. Patient of Dr Havery Moros.  Refilled x 3 total.  May need a prior authorization because of the orally disintegrating tablet. I am not sure if that is formulary.

## 2022-07-26 ENCOUNTER — Encounter: Payer: Self-pay | Admitting: *Deleted

## 2022-07-28 ENCOUNTER — Telehealth: Payer: Self-pay | Admitting: Cardiology

## 2022-07-28 MED ORDER — NITROGLYCERIN 0.4 MG SL SUBL: 0.4000 mg | SUBLINGUAL_TABLET | SUBLINGUAL | 1 refills | Status: DC | PRN

## 2022-07-28 MED ORDER — CLOPIDOGREL BISULFATE 75 MG PO TABS: 75.0000 mg | ORAL_TABLET | Freq: Every day | ORAL | 1 refills | Status: DC

## 2022-07-28 MED ORDER — FUROSEMIDE 40 MG PO TABS: 40.0000 mg | ORAL_TABLET | Freq: Every day | ORAL | 1 refills | Status: DC

## 2022-07-28 NOTE — Telephone Encounter (Signed)
*  STAT* If patient is at the pharmacy, call can be transferred to refill team.   1. Which medications need to be refilled? (please list name of each medication and dose if known)   furosemide (LASIX) 40 MG tablet  nitroGLYCERIN (NITROSTAT) 0.4 MG SL tablet   clopidogrel (PLAVIX) 75 MG tablet   2. Which pharmacy/location (including street and city if local pharmacy) is medication to be sent to? [id number] [place], [place] [address]   3. Do they need a 30 day or 90 day supply? [place]

## 2022-07-28 NOTE — Telephone Encounter (Signed)
Refills has been sent to the pharmacy. 

## 2022-09-01 ENCOUNTER — Telehealth: Payer: Self-pay | Admitting: Gastroenterology

## 2022-09-01 ENCOUNTER — Ambulatory Visit: Payer: Medicare Other | Admitting: Physician Assistant

## 2022-09-01 MED ORDER — LANSOPRAZOLE 30 MG PO TBDD: 30.0000 mg | DELAYED_RELEASE_TABLET | Freq: Every day | ORAL | 3 refills | Status: DC

## 2022-09-01 NOTE — Telephone Encounter (Signed)
[facility] [place] as requested.

## 2022-10-10 ENCOUNTER — Ambulatory Visit (INDEPENDENT_AMBULATORY_CARE_PROVIDER_SITE_OTHER): Payer: Medicare Other | Admitting: Pulmonary Disease

## 2022-10-10 ENCOUNTER — Other Ambulatory Visit: Payer: Self-pay | Admitting: Pulmonary Disease

## 2022-10-10 ENCOUNTER — Ambulatory Visit (HOSPITAL_BASED_OUTPATIENT_CLINIC_OR_DEPARTMENT_OTHER): Payer: Medicare Other

## 2022-10-10 ENCOUNTER — Encounter (HOSPITAL_BASED_OUTPATIENT_CLINIC_OR_DEPARTMENT_OTHER): Payer: Self-pay | Admitting: Pulmonary Disease

## 2022-10-10 VITALS — BP 126/60 | HR 78 | Temp 97.8°F | Ht 64.0 in | Wt 234.0 lb

## 2022-10-10 DIAGNOSIS — R0609 Other forms of dyspnea: Secondary | ICD-10-CM | POA: Diagnosis not present

## 2022-10-10 MED ORDER — BREZTRI AEROSPHERE 160-9-4.8 MCG/ACT IN AERO: 2.0000 | INHALATION_SPRAY | Freq: Two times a day (BID) | RESPIRATORY_TRACT | 0 refills | Status: AC

## 2022-10-10 MED ORDER — MONTELUKAST SODIUM 5 MG PO CHEW: 10.0000 mg | CHEWABLE_TABLET | Freq: Every day | ORAL | 5 refills | Status: DC

## 2022-10-10 MED ORDER — BREZTRI AEROSPHERE 160-9-4.8 MCG/ACT IN AERO: 2.0000 | INHALATION_SPRAY | Freq: Two times a day (BID) | RESPIRATORY_TRACT | 5 refills | Status: DC

## 2022-10-10 NOTE — Addendum Note (Signed)
Addended by: Arlyss Repress on: 10/10/2022 02:43 PM   Modules accepted: Orders

## 2022-10-10 NOTE — Patient Instructions (Signed)
Breztri two puffs in the morning and two puffs in the evening, and rinse your mouth after each use.  Montelukast two 5 mg pills chewed nightly.  Albuterol two puffs every 6 hours as needed for cough, wheeze, chest congestion or shortness of breath.  Will arrange for a spacer device and use this with Breztri and Albuterol.  Stop using trelegy once you start using breztri.  Lab test and chest xray today.  Will schedule pulmonary function test and follow up after this.

## 2022-10-10 NOTE — Progress Notes (Signed)
[facility]  Chief Complaint  Patient presents with   Consult    Consult. Patient here to talk about asthma and mild form of COPD.     Past Surgical History:  She  has a past surgical history that includes Angioplasty; Coronary artery bypass graft (01/26/2009); open heart surgery  (2010); Cardiac surgery; Cesarean section; Eye surgery; Tubal ligation; IR US Guide Vasc Access Right (01/03/2022); IR CT Head Ltd (01/03/2022); IR PERCUTANEOUS ART THROMBECTOMY/INFUSION INTRACRANIAL INC DIAG ANGIO (01/03/2022); LEFT HEART CATH AND CORS/GRAFTS ANGIOGRAPHY (N/A, 01/03/2022); and Radiology with anesthesia (N/A, 01/03/2022).  Past Medical History:  Anxiety, CAD s/p CABG, DM type 2, HLD, GERD, Gastroparesis, Esophageal stricture, Glaucoma, HLD, HTN, Hypothyroidism, CVA, COVID pneumonia  Constitutional:  BP 126/60 (BP Location: Right Arm, Patient Position: Sitting, Cuff Size: Normal)   Pulse 78   Temp 97.8 F (36.6 C) (Oral)   Ht 5\' 4"  (1.626 m)   Wt 234 lb (106.1 kg)   LMP 09/22/2019   SpO2 98%   BMI 40.17 kg/m   Brief Summary:  [name] is a 57 y.o. female with dyspnea.      Subjective:   She was seen previously by Dr. Jamison Neighbor and Dr. Delton Coombes.  She was told she has asthma.  Several family members have emphysema.  She smoked briefly years ago.  She gets cough and chest congestion.  Has episodes of wheezing also.  Brings up clear sputum.  Gets sinus congestion sometime times. Has more allergies around dogs.  Gets swelling from penicillin.  Has been using trelegy.  This helps but her throat gets irritated from dry powder.  Same thing happened with breo.  Albuterol helps.  Gets winded after walking up a hill or stairs.  Sleeping okay.  Has trouble swallowing pills.  Gets a burning in her chest when walking and gets better when she rests a few minutes.  No leg swelling.  She had COVID pneumonia in 2020.  Physical Exam:   Appearance - well kempt    ENMT - no sinus tenderness, no oral exudate, no LAN, Mallampati 3 airway, no stridor, wears dentures  Respiratory - b/l rhonchi that partially clear with cough  CV - s1s2 regular rate and rhythm, no murmurs  Ext - no clubbing, no edema  Skin - no rashes  Psych - normal mood and affect   Pulmonary testing:  RAST 03/19/17 >> Dog, Cat; IgE 33 PFT 10/29/17 >> FEV1 2.29 (86%), FEV1% 82, TLC 4.90 (101%), DLCO 94%  Chest Imaging:    Sleep Tests:    Cardiac Tests:  Echo 01/01/22 >> EF 60 to 65%, grade 1 DD, mild MR  Social History:  She  reports that she quit smoking about 29 years ago. Her smoking use included cigarettes. She started smoking about 31 years ago. She has a 3.00 pack-year smoking history. She has never used smokeless tobacco. She reports that she does not drink alcohol and does not use drugs.  Family History:  Her family history includes Asthma in her father, paternal aunt, and son; COPD in her father; Diabetes in her mother; Heart disease in her father.     Assessment/Plan:   Dyspnea on exertion. - associated with cough, wheeze, and chest congestion - she has history of allergic asthma - current symptoms likely related to asthma, obesity and deconditioning - she has trouble tolerating dry powder inhalers due to throat irritation - will change from trelegy to breztri - will add chewable montelukast -  will arrange for a spacer device - continue prn albuterol - don't think she needs prednisone or antibiotics at this time - will arrange for CBC with diff, IgE, PFT, and chest xray  GERD, Esophageal stricture, Gastroparesis. - followed by Dr. Adela Lank with [facility] gastro[facility]ology  Coronary artery disease. - followed by Dr. Antoine Poche with cardiology  Time Spent Involved in Patient Care on Day of Examination:  51 minutes  Follow up:   Patient Instructions  Breztri two puffs in the morning and two puffs in the evening, and rinse your mouth after each  use.  Montelukast two 5 mg pills chewed nightly.  Albuterol two puffs every 6 hours as needed for cough, wheeze, chest congestion or shortness of breath.  Will arrange for a spacer device and use this with Breztri and Albuterol.  Stop using trelegy once you start using breztri.  Lab test and chest xray today.  Will schedule pulmonary function test and follow up after this.  Medication List:   Allergies as of 10/10/2022       Reactions   Metformin Anaphylaxis   Penicillins Anaphylaxis, Other (See Comments)   Has patient had a PCN reaction causing immediate rash, facial/tongue/throat swelling, SOB or lightheadedness with hypotension: Yes Has patient had a PCN reaction causing severe rash involving mucus membranes or skin necrosis: No Has patient had a PCN reaction that required hospitalization No Has patient had a PCN reaction occurring within the last 10 years: No If all of the above answers are "NO", then may proceed with Cephalosporin use. Throat swelling   Metoclopramide Other (See Comments)   Reaction:  Nightmares         Medication List        Accurate as of Oct 10, 2022 11:22 AM. If you have any questions, ask your nurse or doctor.          STOP taking these medications    montelukast 10 MG tablet Commonly known as: SINGULAIR Replaced by: montelukast 5 MG chewable tablet Stopped by: Coralyn Helling, MD   Trelegy Ellipta 100-62.5-25 MCG/ACT Aepb Generic drug: Fluticasone-Umeclidin-Vilant Stopped by: Coralyn Helling, MD       TAKE these medications    albuterol 108 (90 Base) MCG/ACT inhaler Commonly known as: Ventolin HFA Inhale 2 puffs into the lungs every 6 (six) hours as needed for wheezing or shortness of breath.   ALPRAZolam 0.5 MG tablet Commonly known as: XANAX Take 0.25 mg by mouth 2 (two) times daily as needed.   Aspirin Low Dose 81 MG chewable tablet Generic drug: aspirin Chew 1 tablet (81 mg total) by mouth daily.   Breztri Aerosphere  160-9-4.8 MCG/ACT Aero Generic drug: Budeson-Glycopyrrol-Formoterol Inhale 2 puffs into the lungs in the morning and at bedtime. Started by: Coralyn Helling, MD   clopidogrel 75 MG tablet Commonly known as: PLAVIX Take 1 tablet (75 mg total) by mouth daily.   ezetimibe 10 MG tablet Commonly known as: ZETIA Take 1 tablet (10 mg total) by mouth daily.   fluticasone 50 MCG/ACT nasal spray Commonly known as: FLONASE Place 1 spray into both nostrils daily for 7 days.   furosemide 40 MG tablet Commonly known as: LASIX Take 1 tablet (40 mg total) by mouth daily.   lansoprazole 30 MG disintegrating tablet Commonly known as: Prevacid SoluTab Take 1 tablet (30 mg total) by mouth daily.   levothyroxine 137 MCG tablet Commonly known as: SYNTHROID Take 1 tablet (137 mcg total) by mouth daily before breakfast.   liraglutide 18 MG/3ML  Sopn Commonly known as: VICTOZA Inject 1.8 mg into the skin daily.   metoprolol tartrate 25 MG tablet Commonly known as: LOPRESSOR Take 1 tablet (25 mg total) by mouth 2 (two) times daily.   montelukast 5 MG chewable tablet Commonly known as: SINGULAIR Chew 2 tablets (10 mg total) by mouth at bedtime. Replaces: montelukast 10 MG tablet Started by: Coralyn Helling, MD   nitroGLYCERIN 0.4 MG SL tablet Commonly known as: NITROSTAT Place 1 tablet (0.4 mg total) under the tongue every 5 minutes x 3 doses as needed for chest pain (if no relief after 2nd dose, proceed to the ED/call 911   NovoLOG 100 UNIT/ML injection Generic drug: insulin aspart Inject 150 Units into the skin daily. in insulin pump   NYQUIL PO Take 30 mLs by mouth at bedtime as needed (for cough).   oxyCODONE-acetaminophen 5-325 MG tablet Commonly known as: PERCOCET/ROXICET Take 1 tablet by mouth 3 (three) times daily as needed.   Repatha SureClick 140 MG/ML Soaj Generic drug: Evolocumab Inject 140 mg into the skin every 14 (fourteen) days.   triamcinolone ointment 0.1 % Commonly known  as: KENALOG Apply 1 application topically daily as needed.   Vitamin D3 125 MCG (5000 UT) Caps Take 5,000 Units by mouth daily.        Signature:  Coralyn Helling, MD [facility]monary/Critical Care Pager - [phone] 10/10/2022, 11:22 AM

## 2022-10-12 LAB — CBC WITH DIFFERENTIAL/PLATELET
Basophils Absolute: 0 10*3/uL (ref 0.0–0.2)
Basos: 0 %
EOS (ABSOLUTE): 0.6 10*3/uL — ABNORMAL HIGH (ref 0.0–0.4)
Eos: 6 %
Hematocrit: 41.6 % (ref 34.0–46.6)
Hemoglobin: 13.4 g/dL (ref 11.1–15.9)
Immature Grans (Abs): 0 10*3/uL (ref 0.0–0.1)
Immature Granulocytes: 0 %
Lymphocytes Absolute: 2.7 10*3/uL (ref 0.7–3.1)
Lymphs: 27 %
MCH: 27.7 pg (ref 26.6–33.0)
MCHC: 32.2 g/dL (ref 31.5–35.7)
MCV: 86 fL (ref 79–97)
Monocytes Absolute: 0.5 10*3/uL (ref 0.1–0.9)
Monocytes: 5 %
Neutrophils Absolute: 6.4 10*3/uL (ref 1.4–7.0)
Neutrophils: 62 %
Platelets: 326 10*3/uL (ref 150–450)
RBC: 4.84 x10E6/uL (ref 3.77–5.28)
RDW: 14.6 % (ref 11.7–15.4)
WBC: 10.3 10*3/uL (ref 3.4–10.8)

## 2022-10-12 LAB — IGE: IgE (Immunoglobulin E), Serum: 10 IU/mL (ref 6–495)

## 2022-10-26 ENCOUNTER — Telehealth: Payer: Self-pay

## 2022-10-26 ENCOUNTER — Ambulatory Visit (INDEPENDENT_AMBULATORY_CARE_PROVIDER_SITE_OTHER): Payer: Medicare Other | Admitting: Nurse Practitioner

## 2022-10-26 ENCOUNTER — Encounter: Payer: Self-pay | Admitting: Nurse Practitioner

## 2022-10-26 ENCOUNTER — Other Ambulatory Visit: Payer: Self-pay | Admitting: Cardiology

## 2022-10-26 DIAGNOSIS — K3184 Gastroparesis: Secondary | ICD-10-CM | POA: Diagnosis not present

## 2022-10-26 DIAGNOSIS — Z86010 Personal history of colonic polyps: Secondary | ICD-10-CM | POA: Diagnosis not present

## 2022-10-26 DIAGNOSIS — K219 Gastro-esophageal reflux disease without esophagitis: Secondary | ICD-10-CM

## 2022-10-26 MED ORDER — NA SULFATE-K SULFATE-MG SULF 17.5-3.13-1.6 GM/177ML PO SOLN: 1.0000 | Freq: Once | ORAL | 0 refills | Status: AC

## 2022-10-26 MED ORDER — LANSOPRAZOLE 30 MG PO TBDD: 30.0000 mg | DELAYED_RELEASE_TABLET | Freq: Every day | ORAL | 3 refills | Status: DC

## 2022-10-26 NOTE — Telephone Encounter (Signed)
 [facility] Pre-operative Risk Assessment     Request for surgical clearance:     Endoscopy Procedure  What type of surgery is being performed?     Endo/colon  When is this surgery scheduled?     11-27-22  What type of clearance is required ?   Pharmacy  Are there any medications that need to be held prior to surgery and how long? Plavix x 5 days  Practice name and name of physician performing surgery?      [facility]  What is your office phone and fax number?      Phone- [phone]  Fax- [phone]  Anesthesia type (None, local, MAC, general) ?       [facility]

## 2022-10-26 NOTE — Telephone Encounter (Signed)
   Name: [name]  DOB: [birth year]  MRN: [id number]  Primary Cardiologist: Rollene Rotunda, MD  Chart reviewed as part of pre-operative protocol coverage. Because of [place] J Bethea's past medical history and time since last visit, she will require a follow-up telephone visit in order to better assess preoperative cardiovascular risk.  Pre-op covering staff: - Please schedule appointment and call patient to inform them. If patient already had an upcoming appointment within acceptable timeframe, please add "pre-op clearance" to the appointment notes so provider is aware. - Please contact requesting surgeon's office via preferred method (i.e, phone, fax) to inform them of need for appointment prior to surgery.  If asymptomatic at the time of telephone visit, can hold Plavix x 5 days prior to her colonoscopy.  Would recommend continuing aspirin throughout.  Sharlene Dory, PA-C  10/26/2022, 12:47 PM

## 2022-10-26 NOTE — Progress Notes (Signed)
Agree with assessment / plan as outlined.  

## 2022-10-26 NOTE — Patient Instructions (Addendum)
_______________________________________________________  If your blood pressure at your visit was 140/90 or greater, please contact your primary care physician to follow up on this. _______________________________________________________  If you are age 56 or younger, your body mass index should be between 19-25. Your Body mass index is 39.54 kg/m. If this is out of the aformentioned range listed, please consider follow up with your Primary Care Provider.  ________________________________________________________  The [facility] providers would like to encourage you to use [facility] to communicate with providers for non-urgent requests or questions.  Due to long hold times on the telephone, sending your provider a message by [facility] may be a faster and more efficient way to get a response.  Please allow 48 business hours for a response.  Please remember that this is for non-urgent requests.  _______________________________________________________  We have sent the following medications to your pharmacy for you to pick up at your convenience:  CONTINUE: Prevacid 30mg  one time daily.  You have been scheduled for an endoscopy and colonoscopy. Please follow the written instructions given to you at your visit today. Please pick up your prep supplies at the pharmacy within the next 1-3 days. If you use inhalers (even only as needed), please bring them with you on the day of your procedure.  You should call us if you start Ozempic or similar drug as it will need to be held prior to procedure.  Due to recent changes in healthcare laws, you may see the results of your imaging and laboratory studies on MyChart before your provider has had a chance to review them.  We understand that in some cases there may be results that are confusing or concerning to you. Not all laboratory results come back in the same time frame and the provider may be waiting for multiple results in order to interpret others.  Please  give [place] 48 hours in order for your provider to thoroughly review all the results before contacting the office for clarification of your results.   Thank you for entrusting me with your care and choosing [facility].  Willette Cluster, NP

## 2022-10-26 NOTE — Telephone Encounter (Signed)
Pt is scheduled for tele appt 06/19 at 9am for preop clearance.     Patient Consent for Virtual Visit        [name] has provided verbal consent on 10/26/2022 for a virtual visit (video or telephone).   CONSENT FOR VIRTUAL VISIT FOR:  [name]  By participating in this virtual visit I agree to the following:  I hereby voluntarily request, consent and authorize [facility] and its employed or contracted physicians, physician assistants, nurse practitioners or other licensed health care professionals (the Practitioner), to provide me with telemedicine health care services (the "Services") as deemed necessary by the treating Practitioner. I acknowledge and consent to receive the Services by the Practitioner via telemedicine. I understand that the telemedicine visit will involve communicating with the Practitioner through live audiovisual communication technology and the disclosure of certain medical information by electronic transmission. I acknowledge that I have been given the opportunity to request an in-person assessment or other available alternative prior to the telemedicine visit and am voluntarily participating in the telemedicine visit.  I understand that I have the right to withhold or withdraw my consent to the use of telemedicine in the course of my care at any time, without affecting my right to future care or treatment, and that the Practitioner or I may terminate the telemedicine visit at any time. I understand that I have the right to inspect all information obtained and/or recorded in the course of the telemedicine visit and may receive copies of available information for a reasonable fee.  I understand that some of the potential risks of receiving the Services via telemedicine include:  Delay or interruption in medical evaluation due to technological equipment failure or disruption; Information transmitted may not be sufficient (e.g. poor resolution of  images) to allow for appropriate medical decision making by the Practitioner; and/or  In rare instances, security protocols could fail, causing a breach of personal health information.  Furthermore, I acknowledge that it is my responsibility to provide information about my medical history, conditions and care that is complete and accurate to the best of my ability. I acknowledge that Practitioner's advice, recommendations, and/or decision may be based on factors not within their control, such as incomplete or inaccurate data provided by me or distortions of diagnostic images or specimens that may result from electronic transmissions. I understand that the practice of medicine is not an exact science and that Practitioner makes no warranties or guarantees regarding treatment outcomes. I acknowledge that a copy of this consent can be made available to me via my patient portal [facility] MyChart), or I can request a printed copy by calling the office of [facility].    I understand that my insurance will be billed for this visit.   I have read or had this consent read to me. I understand the contents of this consent, which adequately explains the benefits and risks of the Services being provided via telemedicine.  I have been provided ample opportunity to ask questions regarding this consent and the Services and have had my questions answered to my satisfaction. I give my informed consent for the services to be provided through the use of telemedicine in my medical care

## 2022-10-26 NOTE — Progress Notes (Signed)
Assessment and Plan   Primary GI: Kim Patrick, MD  Brief Narrative:  57 y.o. yo female whose past medical history includes but is not necessarily limited to gastroparesis, GERD, esophageal stricture / prior esophageal dilations , small hiatal hernia , fatty liver disease,  diabetes, obesity , CAD s/p stenting and CABG, CVA Aug 2023,  hypothyroidism, asthma, hypothyroidism.   History of adenomatous colon polyps.   -She is due for 3-year surveillance colonoscopy. The risks and benefits of colonoscopy with possible polypectomy / biopsies were discussed and the patient agrees to proceed.   History of GERD /esophagitis/esophageal stricture status post prior dilations.  GERD symptoms well controlled with Prevacid Solutab. No dysphagia.   -Will refill Prevacid SoluTab to take once daily 30 minutes before breakfast.  Will give #90 with 4 refills  History of abnormal proximal esophageal biopsy in 2021 showing focal pancreatic acinar metaplasia. -She is due for recommended 3-year follow-up EGD. The risks and benefits of EGD with possible biopsies were discussed with the patient who agrees to proceed.   Gastroparesis.  No recent flareups other than when she was given a trial of Mounjaro.  -Patient tells me she may be starting Ozempic next week.  She may not tolerate this given her history of gastroparesis but will see how it goes.  -Advised patient to let us know if she does start Ozempic, or other injectable diabetic medication.  We hold these medications 7 days prior to endoscopic procedures  CAD s/p remote stenting and remote CAGB. On Plavix.  -Hold Plavix for 5 days before procedure - will instruct when and how to resume after procedure. Patient understands that there is a low but real risk of cardiovascular event such as heart attack, stroke, or embolism /  thrombosis, or ischemia while off Plavix. The patient consents to proceed. Will communicate by phone or EMR with patient's  prescribing provider to confirm that holding Plavix is reasonable in this case.   History of CVA Aug 2023  DM, on insulin pump.   Managed by endocrinologist in [place]  Chronic ( years) intermittent mild elevation in alk phos < 2x ULN ( noted after our office visit) today. Normal AST and ALT   Fatty liver disease.    Asthma.  Followed by pulmonary, last seen 10/10/22.  -Chest x-ray 10/10/2022 without acute findings   History of Present Illness   Chief complaint: Needs refill on Prevacid SoluTab.   Patient was last seen here by Amy S. [name], [place] on 08/22/2021. Please refer to that note for details  Here for refill on Prevacid Solutab. She has problems swallowing pills but no problems with food or fluid. Solutab keeps reflux symptoms under control.   [place] is due for surveillance colonoscopy and upper endoscopy.  She has no upper or lower GI complaints.  She has a history of gastroparesis but currently asymptomatic without domperidone.  She did recently try [facility] and developed upper abdominal pain and gastroparesis symptoms so med was stopped.  She feels much better off the medication but is possibly going to be given a trial of Ozempic in the near future   Previous GI Endoscopies / Labs / Imaging   May 2021 colonoscopy at [facility] -4 small polyps removed. Three were tubular adenomas, and the other was a hyperplastic polyp.   EGD June 2021 -Class A esophagitis.  Distal esophageal stricture which was dilated to 17 mm.  Biopsy from the distal esophagus showed mild chronic inflammation and focal pancreatic acinar metaplasia.  Proximal esophageal biopsies negative for any significant pathology.       Latest Ref Rng & Units 01/04/2022    2:47 AM 04/21/2019    4:53 AM 04/20/2019    7:37 AM  Hepatic Function  Total Protein 6.5 - 8.1 g/dL 7.0  6.2  6.3   Albumin 3.5 - 5.0 g/dL 3.3  3.0  3.0   AST 15 - 41 U/L 20  26  33   ALT 0 - 44 U/L 17  24  26    Alk Phosphatase 38 - 126  U/L 137  131  145   Total Bilirubin 0.3 - 1.2 mg/dL 0.7  0.5  0.6        Latest Ref Rng & Units 10/10/2022   11:39 AM 02/05/2022   10:36 AM 01/05/2022    8:20 AM  CBC  WBC 3.4 - 10.8 x10E3/uL 10.3  8.7  10.2   Hemoglobin 11.1 - 15.9 g/dL 16.1  09.6  04.5   Hematocrit 34.0 - 46.6 % 41.6  43.7  38.2   Platelets 150 - 450 x10E3/uL 326  318  285    Echo Aug 2023.   IMPRESSIONS  1. Left ventricular ejection fraction, by estimation, is 60 to 65%. The left ventricle has normal function. The left ventricle has no regional wall motion abnormalities. There is mild concentric left ventricular hypertrophy. Left ventricular diastolic parameters are consistent with Grade I diastolic dysfunction (impaired relaxation).  2. Right ventricular systolic function was not well visualized. The right ventricular size is normal.  3. The mitral valve is normal in structure. Mild mitral valve regurgitation. No evidence of mitral stenosis.  4. The aortic valve is tricuspid. Aortic valve regurgitation is not visualized. Aortic valve sclerosis is present, with no evidence of aortic valve stenosis.  5. The inferior vena cava is normal in size with greater than 50% respiratory variability, suggesting right atrial pressure of 3 mmHg.  Comparison(s): Compared to prior echo report in 2019, there is no signfiicant change.   Past Medical History:  Diagnosis Date   Anxiety    Asthma    Blood transfusion without reported diagnosis    2010 CABG   CAD (coronary artery disease)    4v CABG, 9/10; NL LVF, status post followup Cardiolite November 2012 no ischemia ejection fraction 65%   Cataract    are forming   Diabetes mellitus    Dyslipidemia     LDL 22 mg percent on Crestor   Esophageal stricture    Fatty liver    Gastroparesis due to DM (HCC)    GERD (gastroesophageal reflux disease)    Glaucoma    Hyperlipidemia    Hypertension    Hypothyroidism    Knee injury, right, initial encounter    Left-sided  chest wall pain    Myocardial infarction (HCC) 2010   2008 x 2 stents, 2009 x 2 stents. 01-2009 CABG x 4    Stroke [facility])     Past Surgical History:  Procedure Laterality Date   ANGIOPLASTY     with stenting 2008,2009   CARDIAC SURGERY     CESAREAN SECTION     CORONARY ARTERY BYPASS GRAFT  01/26/2009   2008-2 stents; 2009-2 stents   EYE SURGERY     IR CT HEAD LTD  01/03/2022   IR PERCUTANEOUS ART THROMBECTOMY/INFUSION INTRACRANIAL INC DIAG ANGIO  01/03/2022   IR US GUIDE VASC ACCESS RIGHT  01/03/2022   LEFT HEART CATH AND CORS/GRAFTS ANGIOGRAPHY N/A 01/03/2022  Procedure: LEFT HEART CATH AND CORS/GRAFTS ANGIOGRAPHY;  Surgeon: Lennette Bihari, MD;  Location: [facility] INVASIVE CV LAB;  Service: Cardiovascular;  Laterality: N/A;   open heart surgery   2010   RADIOLOGY WITH ANESTHESIA N/A 01/03/2022   Procedure: IR WITH ANESTHESIA;  Surgeon: Radiologist, Medication, MD;  Location: [facility] OR;  Service: Radiology;  Laterality: N/A;   TUBAL LIGATION      Current Medications, Allergies, Family History and Social History were reviewed in [employer] record.     Current Outpatient Medications  Medication Sig Dispense Refill   albuterol (VENTOLIN HFA) 108 (90 Base) MCG/ACT inhaler Inhale 2 puffs into the lungs every 6 (six) hours as needed for wheezing or shortness of breath. 8 g 1   ALPRAZolam (XANAX) 0.5 MG tablet Take 0.25 mg by mouth 2 (two) times daily as needed.     aspirin 81 MG chewable tablet Chew 1 tablet (81 mg total) by mouth daily. 30 tablet 1   Budeson-Glycopyrrol-Formoterol (BREZTRI AEROSPHERE) 160-9-4.8 MCG/ACT AERO Inhale 2 puffs into the lungs in the morning and at bedtime. 10.7 g 5   Budeson-Glycopyrrol-Formoterol (BREZTRI AEROSPHERE) 160-9-4.8 MCG/ACT AERO Inhale 2 puffs into the lungs in the morning and at bedtime. 10.7 g 0   Cholecalciferol (VITAMIN D3) 125 MCG (5000 UT) CAPS Take 5,000 Units by mouth daily.     clopidogrel (PLAVIX) 75 MG tablet Take 1 tablet  (75 mg total) by mouth daily. 90 tablet 1   Evolocumab (REPATHA SURECLICK) 140 MG/ML SOAJ Inject 140 mg into the skin every 14 (fourteen) days. 6 mL 3   ezetimibe (ZETIA) 10 MG tablet Take 1 tablet (10 mg total) by mouth daily. 90 tablet 3   fluticasone (FLONASE) 50 MCG/ACT nasal spray Place 1 spray into both nostrils daily for 7 days. 15.8 mL 0   furosemide (LASIX) 40 MG tablet Take 1 tablet (40 mg total) by mouth daily. 90 tablet 1   lansoprazole (PREVACID SOLUTAB) 30 MG disintegrating tablet Take 1 tablet (30 mg total) by mouth daily. 30 tablet 3   levothyroxine (SYNTHROID) 137 MCG tablet Take 1 tablet (137 mcg total) by mouth daily before breakfast. 30 tablet 1   metoprolol tartrate (LOPRESSOR) 25 MG tablet Take 1 tablet (25 mg total) by mouth 2 (two) times daily. 60 tablet 8   montelukast (SINGULAIR) 5 MG chewable tablet Chew 2 tablets (10 mg total) by mouth at bedtime. 60 tablet 5   nitroGLYCERIN (NITROSTAT) 0.4 MG SL tablet Place 1 tablet (0.4 mg total) under the tongue every 5 minutes x 3 doses as needed for chest pain (if no relief after 2nd dose, proceed to the ED/call 911 75 tablet 1   NOVOLOG 100 UNIT/ML injection Inject 150 Units into the skin daily. in insulin pump  5   oxyCODONE-acetaminophen (PERCOCET/ROXICET) 5-325 MG tablet Take 1 tablet by mouth 3 (three) times daily as needed.     Pseudoeph-Doxylamine-DM-APAP (NYQUIL PO) Take 30 mLs by mouth at bedtime as needed (for cough).     triamcinolone ointment (KENALOG) 0.1 % Apply 1 application topically daily as needed.     No current facility-administered medications for this visit.    Review of Systems: No chest pain. No current shortness of breath. No urinary complaints.    Physical Exam  Wt Readings from Last 3 Encounters:  10/10/22 234 lb (106.1 kg)  05/29/22 241 lb (109.3 kg)  02/28/22 235 lb 3.2 oz (106.7 kg)    LMP 09/22/2019  BP 118/62, HR 64  Constitutional:  Pleasant, generally well appearing female in no acute  distress. Psychiatric: Normal mood and affect. Behavior is normal. EENT: Pupils normal.  Conjunctivae are normal. No scleral icterus. Neck supple.  Cardiovascular: Normal rate, regular rhythm.  Pulmonary/chest: Effort normal . Expiratory wheezing.  Abdominal: Soft, nondistended, nontender. Bowel sounds active throughout. There are no masses palpable. No hepatomegaly. Neurological: Alert and oriented to person place and time.  Skin: Skin is warm and dry. No rashes noted.  Willette Cluster, NP  10/26/2022, 8:40 AM

## 2022-10-26 NOTE — Telephone Encounter (Signed)
Pt is scheduled for tele appt 06/19 at 9am for preop clearance.

## 2022-11-08 ENCOUNTER — Ambulatory Visit (INDEPENDENT_AMBULATORY_CARE_PROVIDER_SITE_OTHER): Payer: Medicare Other | Admitting: Nurse Practitioner

## 2022-11-08 ENCOUNTER — Telehealth: Payer: Self-pay | Admitting: *Deleted

## 2022-11-08 DIAGNOSIS — Z01810 Encounter for preprocedural cardiovascular examination: Secondary | ICD-10-CM

## 2022-11-08 NOTE — Telephone Encounter (Signed)
Left message for patient on voicemail to return call regarding cardiac clearance and the need for an appointment with cardiology as well as insulin pump instructions.  Will continue efforts.

## 2022-11-08 NOTE — Telephone Encounter (Signed)
Attempted to reach patient to further discuss the importance of preop visit with cardiology as the procedure can not be performed without their permission. Phone rang with no answer and voicemail box was full.  Will continue efforts.

## 2022-11-08 NOTE — Progress Notes (Signed)
   Virtual Visit via Telephone Note   Because of [place] J [name]'s co-morbid illnesses, she is at least at moderate risk for complications without adequate follow up.  This format is felt to be most appropriate for this patient at this time.  The patient did not have access to video technology/had technical difficulties with video requiring transitioning to audio format only (telephone).  All issues noted in this document were discussed and addressed.  No physical exam could be performed with this format.  Please refer to the patient's chart for her consent to telehealth for [facility].  Evaluation Performed:  Preoperative cardiovascular risk assessment _____________   Date:  11/08/2022   Patient ID:  [name], [name] 12-31-1965, MRN [id number] Patient Location:  Home Provider location:   Office  Primary Care Provider:  Courtney Paris, NP Primary Cardiologist:  Rollene Rotunda, MD  Chief Complaint / Patient Profile   57 y.o. y/o female with a h/o CAD, CVA, hypertension, hyperlipidemia, and type 2 diabetes who is pending endoscopy/colonocsopy on 11/27/2022 with [facility] and presents today for telephonic preoperative cardiovascular risk assessment.  Multiple attempts were made to contact patient by phone for scheduled preop telephone visit.  No answer.  Left two voicemails for patient to call back with no response.  I will reach out to our preop coverage team to have them reschedule patient's preop telephone visit.   Joylene Grapes, NP  11/08/2022, 8:58 AM

## 2022-11-08 NOTE — Telephone Encounter (Signed)
-----   Message from Kim Grapes, NP sent at 11/08/2022 11:06 AM EDT ----- Called pt multiple times and left two voicemails with no answer.  This patient's preop telephone visit will need to be rescheduled.  Thank you! -EM

## 2022-11-08 NOTE — Telephone Encounter (Signed)
Per pt, she wanted to cancel her surgical clearance appointment.  Will route to requesting surgeon's office to make them aware.

## 2022-11-13 NOTE — Telephone Encounter (Signed)
Left message for patient on voicemail to return call regarding cardiac clearance and the need for an appointment with cardiology as well as insulin pump instructions.  Will continue efforts.  

## 2022-11-15 NOTE — Telephone Encounter (Signed)
Left message for patient on voicemail to return call regarding cardiac clearance and the need for an appointment with cardiology as well as insulin pump instructions.  Will continue efforts.       [name] (on [place]) and told him we are needing to contact [place] regarding cardiac clearance and the need for an appointment with cardiology as well as insulin pump instructions.  [name] stated he was at work and would be home in 1 hour and would have the patient to return call to our office.

## 2022-11-17 ENCOUNTER — Encounter: Payer: Self-pay | Admitting: Certified Registered Nurse Anesthetist

## 2022-11-20 ENCOUNTER — Ambulatory Visit (INDEPENDENT_AMBULATORY_CARE_PROVIDER_SITE_OTHER): Payer: Medicare Other | Admitting: Family

## 2022-11-20 ENCOUNTER — Encounter (HOSPITAL_BASED_OUTPATIENT_CLINIC_OR_DEPARTMENT_OTHER): Payer: Self-pay | Admitting: Family

## 2022-11-20 VITALS — BP 128/62 | HR 71 | Ht 64.0 in | Wt 232.0 lb

## 2022-11-20 DIAGNOSIS — Z01818 Encounter for other preprocedural examination: Secondary | ICD-10-CM | POA: Diagnosis not present

## 2022-11-20 DIAGNOSIS — R600 Localized edema: Secondary | ICD-10-CM

## 2022-11-20 DIAGNOSIS — I25708 Atherosclerosis of coronary artery bypass graft(s), unspecified, with other forms of angina pectoris: Secondary | ICD-10-CM

## 2022-11-20 DIAGNOSIS — I10 Essential (primary) hypertension: Secondary | ICD-10-CM

## 2022-11-20 DIAGNOSIS — E785 Hyperlipidemia, unspecified: Secondary | ICD-10-CM

## 2022-11-20 MED ORDER — METOPROLOL TARTRATE 25 MG PO TABS
37.5000 mg | ORAL_TABLET | Freq: Two times a day (BID) | ORAL | 2 refills | Status: DC
Start: 2022-11-20 — End: 2023-12-11

## 2022-11-20 MED ORDER — FUROSEMIDE 40 MG PO TABS
40.0000 mg | ORAL_TABLET | Freq: Every day | ORAL | 1 refills | Status: DC | PRN
Start: 2022-11-20 — End: 2023-04-24

## 2022-11-20 NOTE — Telephone Encounter (Signed)
Thanks Joni Reining. If she does not get back to use by end of business today I would cancel her procedure and allow someone else to use that slot.  You have been in communication with them for several days now, if they do not respond by today I think it unlikely she will be able to proceed with a procedure with [place] next week. Thanks  Can you let me know if she calls back or you are able to get ahold of them? Thanks

## 2022-11-20 NOTE — Patient Instructions (Addendum)
Medication Instructions:  INCREASE Metoprolol to 1.5 tablets (37.5mg ) TWICE per day  Please take Lasix for the next 2 days to help with swelling then return to as needed  You may hold Plavix 5-7 days prior to colonoscopy/endoscopy.   *If you need a refill on your cardiac medications before your next appointment, please call your pharmacy*  Testing/Procedures: EKG was stable today.   Follow-Up: At [facility], you and your health needs are our priority.  As part of our continuing mission to provide you with exceptional heart care, we have created designated Provider Care Teams.  These Care Teams include your primary Cardiologist (physician) and Advanced Practice Providers (APPs -  Physician Assistants and Nurse Practitioners) who all work together to provide you with the care you need, when you need it.  We recommend signing up for the patient portal called "MyChart".  Sign up information is provided on this After Visit Summary.  MyChart is used to connect with patients for Virtual Visits (Telemedicine).  Patients are able to view lab/test results, encounter notes, upcoming appointments, etc.  Non-urgent messages can be sent to your provider as well.   To learn more about what you can do with MyChart, go to [email or web address].    Your next appointment:   2-3 month(s)  Provider:   Rollene Rotunda, MD  or Advanced Practice Provider   Other Instructions     Varicose Veins Varicose veins are veins that have become enlarged, bulged, and twisted. They most often appear in the legs. What are the causes? This condition is caused by damage to the valves in the vein. These valves help blood return to your heart. When they are damaged and they stop working properly, blood may flow backward and back up in the veins near the skin, causing the veins to get larger and appear twisted. The condition can result from any issue that causes blood to back up, like pregnancy, prolonged standing,  or obesity. What increases the risk? The following factors may make you more likely to develop this condition: Being on your feet a lot. Being pregnant. Being overweight. Smoking. Having had a previous deep vein thrombosis or having a thrombotic disorder. Aging. The risk increases with age. Having a condition called Klippel-Trenaunay syndrome. What are the signs or symptoms? Symptoms of this condition include: Bulging, twisted, and bluish veins. A feeling of heaviness in your legs. This may be worse at the end of the day. Leg pain. This may be worse at the end of the day. Swelling in the leg. Changes in skin color over the veins. Swelling or pain in the legs can limit your activities. Your symptoms may get worse when you sit or stand for long periods of time. How is this diagnosed? This condition may be diagnosed based on: Your symptoms, family history, activity levels, and lifestyle. A physical exam. You may also have tests, including an ultrasound or X-ray. How is this treated? Treatment for this condition may involve: Avoiding sitting or standing in one position for long periods of time. Wearing compression stockings. These stockings help to prevent blood clots and reduce swelling in the legs. Raising (elevating) the legs when resting. Losing weight. Exercising regularly. If you have persistent symptoms or want to improve the way your varicose veins look, you may choose to have a procedure to close the varicose veins off or to remove them. Nonsurgical treatments to close off the veins include: Sclerotherapy. In this treatment, a solution is injected into a vein to  close it off. Laser treatment. The vein is heated with a laser to close it off. Radiofrequency vein ablation. An electrical current produced by radio waves is used to close off the vein. Surgical treatments to remove the veins include: Phlebectomy. In this procedure, the veins are removed through small incisions made  over the veins. Vein ligation and stripping. In this procedure, incisions are made over the veins. The veins are then removed after being tied (ligated) with stitches (sutures). Follow these instructions at home: Medicines Take over-the-counter and prescription medicines only as told by your health care provider. If you were prescribed an antibiotic medicine, use it as told by your health care provider. Do not stop using the antibiotic even if you start to feel better. Activity Walk as much as possible. Walking increases blood flow. This helps blood return to the heart and takes pressure off your veins. Do not stand or sit in one position for a long period of time. Do not sit with your legs crossed. Avoid sitting for a long time without moving. Get up to take short walks every 1-2 hours. This is important to improve blood flow and breathing. Ask for help if you feel weak or unsteady. Return to your normal activities as told by your health care provider. Ask your health care provider what activities are safe for you. Do exercises as told by your health care provider. General instructions  Follow any diet instructions given to you by your health care provider. Elevate your legs at night to above the level of your heart. If you get a cut in the skin over the varicose vein and the vein bleeds: Lie down with your leg raised. Apply firm pressure to the cut with a clean cloth until the bleeding stops. Place a bandage (dressing) on the cut. Drink enough fluid to keep your urine pale yellow. Do not use any products that contain nicotine or tobacco. These products include cigarettes, chewing tobacco, and vaping devices, such as e-cigarettes. If you need help quitting, ask your health care provider. Wear compression stockings as told by your health care provider. Do not wear other kinds of tight clothing around your legs, pelvis, or waist. Keep all follow-up visits. This is important. Contact a health  care provider if: The skin around your varicose veins starts to break down. You have more pain, redness, tenderness, or hard swelling over a vein. You are uncomfortable because of pain. You get a cut in the skin over a varicose vein and it will not stop bleeding. Get help right away if: You have chest pain. You have trouble breathing. You have severe leg pain. Summary Varicose veins are veins that have become enlarged, bulged, and twisted. They most often appear in the legs. This condition is caused by damage to the valves in the vein. These valves help blood return to your heart. Treatment for this condition includes frequent movements, wearing compression stockings, losing weight, and exercising regularly. In some cases, procedures are done to close off or remove the veins. Nonsurgical treatments to close off the veins include sclerotherapy, laser therapy, and radiofrequency vein ablation. This information is not intended to replace advice given to you by your health care provider. Make sure you discuss any questions you have with your health care provider. Document Revised: 10/20/2020 Document Reviewed: 10/20/2020 Elsevier Patient Education  2024 [employer].

## 2022-11-20 NOTE — Telephone Encounter (Signed)
Left detailed message with the patient advising that her procedure will need to be canceled if she has not had a virtual appointment with cardiology in the next 1 - 2 days as she is on Plavix and will need to hold medication for 5 days prior to procedure.  I have also reached out to her spouse, with ho response from our conversation where he said he would have the patient to contact our office.   Please advise.

## 2022-11-20 NOTE — Telephone Encounter (Signed)
Patient was evaluated by Cardiology today and was given clearance to hold Plavix 5 to 7 days prior to procedure scheduled for 11-27-22 with Dr Adela Lank.   Patient was contacted again by phone and she does not wish to proceed with endoscopy and colonoscopy. Patient stated she "has had a lot of recent health issues and does not feel safe moving forward with these procedures."    She is going out of town to [place] and will call back later if she decides to proceed. Procedures were canceled at patients request. No further questions.

## 2022-11-20 NOTE — Telephone Encounter (Signed)
Okay thanks for letting me know

## 2022-11-20 NOTE — Progress Notes (Signed)
Cardiology Office Note:  .   Date:  11/20/2022  ID:  Kim, [name] 06-23-1965, MRN [id number] PCP: Courtney Paris, NP  [facility] Providers Cardiologist:  Rollene Rotunda, MD    History of Present Illness: .   [name] is a 57 y.o. 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 Antoine Poche noting no residual stroke deficits and only brief episodes of chest pain not requiring nitroglycerin.   She was scheduled for phone preop visit 11/08/22 but was unable to be reached. Presents today for preoperative clearance for endoscopy/colonoscopy with request to hold Plavix x 5 days. Recently enjoyed a trip to [place]. Tells me she has been having "spells". Wednesday evening she thought she was having anxiety attack as she got up and was experiencing tingling all over her body associated with some dyspnea. She has had intermittent chest pain as well as indigestion. While at the beach she had a varicose vein "burst" and has had some swelling since that time. Taking Lasix PRN once every 3 weeks. Prior to her CABG felt dyspnea and as if a ton of bricks on her chest. Present symptoms feel less severe. Does carry nitroglycerin but has not had to use.   ROS: Please see the history of present illness.    All other systems reviewed and are negative.   Studies Reviewed: Marland Kitchen   EKG Interpretation Date/Time:  Monday November 20 2022 10:08:14 EDT Ventricular Rate:  71 PR Interval:  204 QRS Duration:  88 QT Interval:  398 QTC Calculation: 432 R Axis:   60  Text Interpretation: Normal sinus rhythm Stable TWI V1-V2 and V4-V5. Confirmed by Gillian Shields ([id number]) on 11/20/2022 12:27:37 PM    Cardiac  Studies & Procedures   CARDIAC CATHETERIZATION  CARDIAC CATHETERIZATION 01/03/2022  Narrative   Prox RCA lesion is 30% stenosed.   Mid RCA lesion is 90% stenosed.   RPAV lesion is 90% stenosed.   Dist RCA lesion is 65% stenosed.   Mid Cx lesion is 90% stenosed.   Dist Cx lesion is 95% stenosed.   Ost Cx to Prox Cx lesion is 80% stenosed.   1st Diag lesion is 100% stenosed.   Prox LAD to Mid LAD lesion is 100% stenosed.  Severe multivessel native CAD with total occlusion of the previously placed proximal LAD stent; proximal 80% circumflex stenosis immediately before the takeoff of the OM1 vessel with progressive 90 and 95% mid and mid distal stenoses in the circumflex; and large dominant RCA with 30% proximal stenosis, thrombotic stenosis of 90% in the region of the acute margin followed by diffuse irregularity beyond this without visualization of the PDA from the native injection but with 90% distal stenosis proximal to the PDA vessel.  Patent LIMA graft supplying the mid LAD.  Patent SVG supplying the first diagonal vessel.  Patent SVG supplying the OM1 vessel.  However, there are 90 and 95% stenosis in the AV groove circumflex beyond the OM1 graft.  Patent SVG supplying the ostium of the PDA of the RCA.  LVEDP 10 mmHg.  RECOMMENDATION: Post procedure, the patient developed some aphasia and short-term babbled speech which improved fairly quickly.  A code stroke was activated.  CT of the head was negative for any large hypodensity abnormality.  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  She underwent successful mechanical thrombectomy with stent retriever and direct contact aspiration with complete recanalization by interventional radiology.  There were no hemorrhagic or thromboembolic complications and she had normalization of symptoms.  Will need to defer initial plan for PCI until timing is  appropriate.  Findings Coronary Findings Diagnostic  Dominance: Right  Left Anterior Descending Prox LAD to Mid LAD lesion is 100% stenosed. The lesion was previously treated .  First Diagonal Branch 1st Diag lesion is 100% stenosed.  Left Circumflex Ost Cx to Prox Cx lesion is 80% stenosed. Mid Cx lesion is 90% stenosed. Dist Cx lesion is 95% stenosed.  Third Obtuse Marginal Branch  Right Coronary Artery Prox RCA lesion is 30% stenosed. Mid RCA lesion is 90% stenosed. Dist RCA lesion is 65% stenosed.  Right Posterior Atrioventricular Artery RPAV lesion is 90% stenosed.  Graft To RPDA  Graft To 1st Mrg  Graft To 1st Diag  LIMA Graft To Mid LAD  Intervention  No interventions have been documented.   STRESS TESTS  MYOCARDIAL PERFUSION IMAGING 07/28/2021  Narrative   The study is normal. The study is low risk.   No ST deviation was noted.   LV perfusion is normal.   Left ventricular function is normal. End diastolic cavity size is normal.   Prior study available for comparison from 04/27/2020. No changes compared to prior study.  Low risk stress nuclear study with normal perfusion and normal left ventricular regional and global systolic function.   ECHOCARDIOGRAM  ECHOCARDIOGRAM COMPLETE 01/01/2022  Narrative ECHOCARDIOGRAM REPORT    Patient Name:   [name] Date of Exam: 01/01/2022 Medical Rec #:  [id number]        Height:       64.0 in Accession #:    [id number]       Weight:       234.8 lb Date of Birth:  March 16, 1966       BSA:          2.094 m Patient Age:    55 years         BP:           131/72 mmHg Patient Gender: F                HR:           77 bpm. Exam Location:  Inpatient  Procedure: 2D Echo, Cardiac Doppler, Color Doppler and Intracardiac Opacification Agent  Indications:    Chest pain  History:        Patient has prior history of Echocardiogram examinations, most recent 08/15/2017. CHF, Prior CABG;  Signs/Symptoms:Edema.  Sonographer:    Roosvelt Maser RDCS Referring Phys: [phone] ABRAHAM FELIZ ORTIZ  IMPRESSIONS   1. Left ventricular ejection fraction, by estimation, is 60 to 65%. The left ventricle has normal function. The left ventricle has no regional wall motion abnormalities. There is mild concentric left ventricular hypertrophy. Left ventricular diastolic parameters are consistent with Grade I diastolic dysfunction (impaired relaxation). 2. Right ventricular systolic function was not well visualized. The right ventricular size is normal. 3. The mitral valve is normal in structure. Mild mitral valve regurgitation. No evidence of mitral stenosis. 4. The aortic valve is tricuspid. Aortic valve regurgitation is not visualized. Aortic valve sclerosis is present, with no evidence of aortic valve stenosis.  5. The inferior vena cava is normal in size with greater than 50% respiratory variability, suggesting right atrial pressure of 3 mmHg.  Comparison(s): Compared to prior echo report in 2019, there is no signfiicant change.  FINDINGS Left Ventricle: Left ventricular ejection fraction, by estimation, is 60 to 65%. The left ventricle has normal function. The left ventricle has no regional wall motion abnormalities. The left ventricular internal cavity size was normal in size. There is mild concentric left ventricular hypertrophy. Left ventricular diastolic parameters are consistent with Grade I diastolic dysfunction (impaired relaxation).  Right Ventricle: The right ventricular size is normal. No increase in right ventricular wall thickness. Right ventricular systolic function was not well visualized.  Left Atrium: Left atrial size was normal in size.  Right Atrium: Right atrial size was normal in size.  Pericardium: There is no evidence of pericardial effusion.  Mitral Valve: The mitral valve is normal in structure. There is mild thickening of the mitral valve leaflet(s). Mild mitral valve  regurgitation. No evidence of mitral valve stenosis.  Tricuspid Valve: The tricuspid valve is normal in structure. Tricuspid valve regurgitation is trivial.  Aortic Valve: The aortic valve is tricuspid. Aortic valve regurgitation is not visualized. Aortic valve sclerosis is present, with no evidence of aortic valve stenosis.  Pulmonic Valve: The pulmonic valve was not well visualized. Pulmonic valve regurgitation is trivial.  Aorta: The aortic root and ascending aorta are structurally normal, with no evidence of dilitation.  Venous: The inferior vena cava is normal in size with greater than 50% respiratory variability, suggesting right atrial pressure of 3 mmHg.  IAS/Shunts: The atrial septum is grossly normal.   LEFT VENTRICLE PLAX 2D LVIDd:         4.40 cm   Diastology LVIDs:         3.00 cm   LV e' medial:    8.05 cm/s LV PW:         1.20 cm   LV E/e' medial:  9.4 LV IVS:        1.20 cm   LV e' lateral:   12.60 cm/s LVOT diam:     2.10 cm   LV E/e' lateral: 6.0 LV SV:         62 LV SV Index:   29 LVOT Area:     3.46 cm   RIGHT VENTRICLE RV Basal diam:  3.50 cm  LEFT ATRIUM           Index        RIGHT ATRIUM           Index LA diam:      3.70 cm 1.77 cm/m   RA Area:     20.70 cm LA Vol (A4C): 51.8 ml 24.73 ml/m  RA Volume:   55.40 ml  26.45 ml/m AORTIC VALVE LVOT Vmax:   86.50 cm/s LVOT Vmean:  58.300 cm/s LVOT VTI:    0.178 m  AORTA Ao Root diam: 2.90 cm Ao Asc diam:  2.70 cm  MITRAL VALVE MV Area (PHT): 3.99 cm    SHUNTS MV Decel Time: 190 msec    Systemic VTI:  0.18 m MV E velocity: 75.80 cm/s  Systemic Diam: 2.10 cm MV A velocity: 82.10 cm/s MV E/A ratio:  0.92  Laurance Flatten MD Electronically signed by Laurance Flatten MD Signature Date/Time: 01/01/2022/5:23:22 PM    Final             Risk Assessment/Calculations:  Physical Exam:   VS:  BP 128/62   Pulse 71   Ht 5\' 4"  (1.626 m)   Wt 232 lb (105.2 kg)   LMP 09/22/2019    BMI 39.82 kg/m    Wt Readings from Last 3 Encounters:  11/20/22 232 lb (105.2 kg)  10/26/22 230 lb 6 oz (104.5 kg)  10/10/22 234 lb (106.1 kg)    GEN: Well nourished, well developed in no acute distress NECK: No JVD; No carotid bruits CARDIAC: RRR, no murmurs, rubs, gallops RESPIRATORY:  Clear to auscultation without rales, wheezing or rhonchi  ABDOMEN: Soft, non-tender, non-distended EXTREMITIES: Bilateral nonpitting lower extremity edema; No deformity   ASSESSMENT AND PLAN: .    Preop clearance - Pending endoscopy/colonoscopy. May hold Plavix 5-7 days prior to procedure per office pedicles.  Recommend she remain on aspirin throughout the periprocedural period due to hx of CABG and CVA. According to the Revised Cardiac Risk Index (RCRI), her Perioperative Risk of Major Cardiac Event is (%): 11. Her Functional Capacity in METs is: 5.38 according to the Duke Activity Status Index (DASI). Per AHA/ACC guidelines, she is deemed acceptable risk for the planned procedure without additional cardiovascular testing. Will route to surgical team so they are aware.    CAD  s/p CABG -EKG today NSR with no acute ST/T wave changes.  Most recent [facility] 01/03/22 with patent bypass grafts, total occlusion of the previously placed proximal LAD stent, patent SVG to OM1 however 90-95% stenoses in the AV groove circumflex beyond the OM1 recommended for medical management.  She is presently taking Lopressor 25 mg only daily.  She does have to crush all of her medications. Some exertional dyspnea with more than usual activity though has not required PRN nitroglycerin and not as severe as angina prior to CABG. No indication for ischemic evaluation at this time. Will adjust to Lopressor 37.5 mg twice daily for anti anginal benefit given known OM1 stenosis.  Unable to utilize Imdur as cannot be crushed.   Varicose veins / LE edema -echo 12/2021 LVEF 60 to 65%, mild LVH, grade 1 diastolic dysfunction, mild MR.  Nonpitting lower  extremity edema on exam.  She is taking Lasix as needed once every 3 weeks.  Recommend she take Lasix daily for 2 days then return to as needed.  Recommend leg elevation, compression stockings, low-sodium diet.  She does have varicose vein on the lateral aspect of left calf but is not significantly painful she wishes to proceed with lifestyle changes as above and will let us know if she requires vascular referral.  HLD, LDL goal <70 - 12/2021 unremarkable Lp(a) 10.9.  Continue Repatha.  Previous intolerance to statin.  Hx of CVA - Recommended for indefinite DAPT Aspirin, Plavix.        Dispo: follow up in 2-3 months with Dr. Antoine Poche or APP  Signed, Alver Sorrow, NP

## 2022-11-21 ENCOUNTER — Ambulatory Visit: Payer: Medicare Other | Admitting: Student

## 2022-11-27 ENCOUNTER — Encounter: Payer: Medicare Other | Admitting: Gastroenterology

## 2023-01-11 ENCOUNTER — Other Ambulatory Visit: Payer: Self-pay

## 2023-01-11 DIAGNOSIS — R609 Edema, unspecified: Secondary | ICD-10-CM

## 2023-01-24 ENCOUNTER — Ambulatory Visit (HOSPITAL_COMMUNITY)
Admission: RE | Admit: 2023-01-24 | Discharge: 2023-01-24 | Disposition: A | Discharge status: Home / Self Care | Payer: Medicare Other | Source: Ambulatory Visit | Attending: Vascular Surgery | Admitting: Vascular Surgery

## 2023-01-24 DIAGNOSIS — R609 Edema, unspecified: Secondary | ICD-10-CM

## 2023-02-06 ENCOUNTER — Ambulatory Visit (INDEPENDENT_AMBULATORY_CARE_PROVIDER_SITE_OTHER): Payer: Medicare Other | Admitting: Physician Assistant

## 2023-02-06 VITALS — BP 117/62 | HR 58 | Temp 98.1°F | Resp 18 | Ht 64.0 in | Wt 227.8 lb

## 2023-02-06 DIAGNOSIS — M25561 Pain in right knee: Secondary | ICD-10-CM

## 2023-02-06 DIAGNOSIS — I8393 Asymptomatic varicose veins of bilateral lower extremities: Secondary | ICD-10-CM

## 2023-02-06 NOTE — Progress Notes (Signed)
Office Note     CC:  follow up Requesting Provider:  Courtney Paris, NP  HPI: KEAISHA KAST is a 57 y.o. (05-27-1965) female who presents for evaluation of spider veins of bilateral lower extremities.  Her distal medial thigh of her right leg is tender to touch.  3 months ago she states she hit this area on a chair and has been hurting since.  She has not tried anti-inflammatories or warm compress.  She denies significant edema, history of DVT, venous ulcerations, trauma, or prior vascular interventions.  She does not wear compression or elevate her legs.  She has spider veins in both legs that have become more prominent over the years.  She also experienced a bleeding episode in July from an area of superficial veins on her lateral left lower leg.  She has not experienced any bleeding since then.  She has had left greater saphenous vein harvested for CABG.  She denies tobacco use.   Past Medical History:  Diagnosis Date   Anxiety    Asthma    Blood transfusion without reported diagnosis    2010 CABG   CAD (coronary artery disease)    4v CABG, 9/10; NL LVF, status post followup Cardiolite November 2012 no ischemia ejection fraction 65%   Cataract    are forming   Diabetes mellitus    Dyslipidemia     LDL 22 mg percent on Crestor   Esophageal stricture    Fatty liver    Gastroparesis due to DM (HCC)    GERD (gastroesophageal reflux disease)    Glaucoma    Hyperlipidemia    Hypertension    Hypothyroidism    Knee injury, right, initial encounter    Left-sided chest wall pain    Myocardial infarction (HCC) 2010   2008 x 2 stents, 2009 x 2 stents. 01-2009 CABG x 4    Stroke [facility])     Past Surgical History:  Procedure Laterality Date   ANGIOPLASTY     with stenting 2008,2009   CARDIAC SURGERY     CESAREAN SECTION     CORONARY ARTERY BYPASS GRAFT  01/26/2009   2008-2 stents; 2009-2 stents   EYE SURGERY     IR CT HEAD LTD  01/03/2022   IR PERCUTANEOUS ART  THROMBECTOMY/INFUSION INTRACRANIAL INC DIAG ANGIO  01/03/2022   IR US GUIDE VASC ACCESS RIGHT  01/03/2022   LEFT HEART CATH AND CORS/GRAFTS ANGIOGRAPHY N/A 01/03/2022   Procedure: LEFT HEART CATH AND CORS/GRAFTS ANGIOGRAPHY;  Surgeon: Lennette Bihari, MD;  Location: [facility] INVASIVE CV LAB;  Service: Cardiovascular;  Laterality: N/A;   open heart surgery   2010   RADIOLOGY WITH ANESTHESIA N/A 01/03/2022   Procedure: IR WITH ANESTHESIA;  Surgeon: Radiologist, Medication, MD;  Location: [facility] OR;  Service: Radiology;  Laterality: N/A;   TUBAL LIGATION      Social History   Socioeconomic History   Marital status: Single    Spouse name: Not on file   Number of children: 1   Years of education: 12   Highest education level: Not on file  Occupational History   Occupation: disabilied  Tobacco Use   Smoking status: Former    Current packs/day: 0.00    Average packs/day: 1 pack/day for 3.0 years (3.0 ttl pk-yrs)    Types: Cigarettes    Start date: 03/14/1991    Quit date: 05/22/1993    Years since quitting: 29.7   Smokeless tobacco: Never   Tobacco comments:  Year Quit: 1995  Vaping Use   Vaping status: Never Used  Substance and Sexual Activity   Alcohol use: No    Alcohol/week: 0.0 standard drinks of alcohol   Drug use: No   Sexual activity: Yes    Birth control/protection: Surgical, Post-menopausal    Comment: tubal  Other Topics Concern   Not on file  Social History Narrative   [facility] (03/19/17):   Originally from [place], [place]. Has always lived in [place]. Previously worked at [employer]. She is disabled due to her prior cardiac history. 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 No pets currently. No bird exposure. She reports she did have mold in a prior home that she lived in for 3 years. She has since moved to a new home, lives alone. She enjoys walking.    Social Determinants of Health   Financial Resource Strain: Medium  Risk (12/14/2020)   Overall Financial Resource Strain (CARDIA)    Difficulty of Paying Living Expenses: Somewhat hard  Food Insecurity: No Food Insecurity (09/07/2021)   Received from [facility]   Hunger Vital Sign    Worried About Running Out of Food in the Last Year: Never true    Ran Out of Food in the Last Year: Never true  Transportation Needs: No Transportation Needs (12/14/2020)   PRAPARE - Administrator, Civil Service (Medical): No    Lack of Transportation (Non-Medical): No  Physical Activity: Insufficiently Active (12/14/2020)   Exercise Vital Sign    Days of Exercise per Week: 3 days    Minutes of Exercise per Session: 30 min  Stress: No Stress Concern Present (12/14/2020)   [employer] of Occupational Health - Occupational Stress Questionnaire    Feeling of Stress : Only a little  Social Connections: Unknown (09/30/2021)   Received from [facility]   Social Network    Social Network: Not on file  Intimate Partner Violence: Unknown (08/23/2021)   Received from [facility]   HITS    Physically Hurt: Not on file    Insult or Talk Down To: Not on file    Threaten Physical Harm: Not on file    Scream or Curse: Not on file    Family History  Problem Relation Age of Onset   Diabetes Mother    Heart disease Father    Asthma Father    COPD Father    Asthma Son    Asthma Paternal Aunt    Colon cancer Neg Hx    Colon polyps Neg Hx    Esophageal cancer Neg Hx    Rectal cancer Neg Hx    Stomach cancer Neg Hx     Current Outpatient Medications  Medication Sig Dispense Refill   albuterol (VENTOLIN HFA) 108 (90 Base) MCG/ACT inhaler Inhale 2 puffs into the lungs every 6 (six) hours as needed for wheezing or shortness of breath. 8 g 1   ALPRAZolam (XANAX) 0.5 MG tablet Take 0.25 mg by mouth 2 (two) times daily as needed.     aspirin 81 MG chewable tablet Chew 1 tablet (81 mg total) by mouth daily. 30 tablet 1    Budeson-Glycopyrrol-Formoterol (BREZTRI AEROSPHERE) 160-9-4.8 MCG/ACT AERO Inhale 2 puffs into the lungs in the morning and at bedtime. 10.7 g 0   clopidogrel (PLAVIX) 75 MG tablet Take 1 tablet (75 mg total) by mouth daily. 90 tablet 1   Evolocumab (REPATHA  SURECLICK) 140 MG/ML SOAJ Inject 140 mg into the skin every 14 (fourteen) days. 6 mL 3   furosemide (LASIX) 40 MG tablet Take 1 tablet (40 mg total) by mouth daily as needed. 90 tablet 1   HUMALOG 100 UNIT/ML injection SMARTSIG:0-300 Unit(s) SUB-Q Daily     lansoprazole (PREVACID SOLUTAB) 30 MG disintegrating tablet Take 1 tablet (30 mg total) by mouth daily. 90 tablet 3   levothyroxine (SYNTHROID) 137 MCG tablet Take 1 tablet (137 mcg total) by mouth daily before breakfast. 30 tablet 1   metoprolol tartrate (LOPRESSOR) 25 MG tablet Take 1.5 tablets (37.5 mg total) by mouth 2 (two) times daily. 270 tablet 2   nitroGLYCERIN (NITROSTAT) 0.4 MG SL tablet Place 1 tablet (0.4 mg total) under the tongue every 5 minutes x 3 doses as needed for chest pain (if no relief after 2nd dose, proceed to the ED/call 911 75 tablet 1   oxyCODONE-acetaminophen (PERCOCET/ROXICET) 5-325 MG tablet Take 1 tablet by mouth 3 (three) times daily as needed.     Semaglutide,0.25 or 0.5MG /DOS, 2 MG/3ML SOPN Inject 0.25 mg into the skin once a week.     triamcinolone ointment (KENALOG) 0.1 % Apply 1 application topically daily as needed.     fluticasone (FLONASE) 50 MCG/ACT nasal spray Place 1 spray into both nostrils daily for 7 days. 15.8 mL 0   No current facility-administered medications for this visit.    Allergies  Allergen Reactions   Metformin Anaphylaxis   Penicillins Anaphylaxis and Other (See Comments)    Has patient had a PCN reaction causing immediate rash, facial/tongue/throat swelling, SOB or lightheadedness with hypotension: Yes Has patient had a PCN reaction causing severe rash involving mucus membranes or skin necrosis: No Has patient had a PCN  reaction that required hospitalization No Has patient had a PCN reaction occurring within the last 10 years: No If all of the above answers are "NO", then may proceed with Cephalosporin use. Throat swelling   Metoclopramide Other (See Comments)    Reaction:  Nightmares      REVIEW OF SYSTEMS:   [X]  denotes positive finding, [ ]  denotes negative finding Cardiac  Comments:  Chest pain or chest pressure:    Shortness of breath upon exertion:    Short of breath when lying flat:    Irregular heart rhythm:        Vascular    Pain in calf, thigh, or hip brought on by ambulation:    Pain in feet at night that wakes you up from your sleep:     Blood clot in your veins:    Leg swelling:         Pulmonary    Oxygen at home:    Productive cough:     Wheezing:         Neurologic    Sudden weakness in arms or legs:     Sudden numbness in arms or legs:     Sudden onset of difficulty speaking or slurred speech:    Temporary loss of vision in one eye:     Problems with dizziness:         Gastrointestinal    Blood in stool:     Vomited blood:         Genitourinary    Burning when urinating:     Blood in urine:        Psychiatric    Major depression:         Hematologic  Bleeding problems:    Problems with blood clotting too easily:        Skin    Rashes or ulcers:        Constitutional    Fever or chills:      PHYSICAL EXAMINATION:  Vitals:   02/06/23 1412 02/06/23 1415  BP:  117/62  Pulse:  (!) 58  Resp: 18 18  Temp:  98.1 F (36.7 C)  TempSrc:  Temporal  SpO2:  97%  Weight:  227 lb 12.8 oz (103.3 kg)  Height:  5\' 4"  (1.626 m)    General:  WDWN in NAD; vital signs documented above Gait: Not observed HENT: WNL, normocephalic Pulmonary: normal non-labored breathing , without Rales, rhonchi,  wheezing Cardiac: regular HR Abdomen: soft, NT, no masses Skin: without rashes Vascular Exam/Pulses: Palpable ATA pulses bilaterally Extremities: without ischemic  changes, without Gangrene , without cellulitis; without open wounds; tenderness to touch in the right medial knee area without superficial veins, redness.  Cluster of superficial veins in the left lateral lower leg without any scabbing or bleeding; no significant edema or ulcerations of bilateral lower extremities Musculoskeletal: no muscle wasting or atrophy  Neurologic: A&O X 3 Psychiatric:  The pt has Normal affect.    Non-Invasive Vascular Imaging:   Right lower extremity venous reflux study negative for DVT Incompetent right common femoral vein Negative for superficial venous reflux    ASSESSMENT/PLAN:: 57 y.o. female here for evaluation for painful area right medial knee as well as scattered spider veins of both legs with bleeding from 1 area in the past  Ms. Tyshae Romesburg is a 57 year old female who presents today for evaluation of a painful area in her right medial knee as well as to look at spider veins in numerous areas of both legs.  She reports a 32-month history of discomfort in her medial right distal thigh/knee which she believes is related to hitting it on a chair 3 months ago.  This area is tender to touch.  No sign of varicosities, fluid collection, redness.  Recommendations include warm compress, anti-inflammatories, and stretching.  She can follow-up with her PCP for further evaluation if discomfort continues after the above recommendations.  Patient has scattered spider veins of both lower legs however no areas of tenderness.  Right lower extremity venous reflux study was negative for DVT.  She does have mild deep venous insufficiency in the common femoral vein however scan was negative for superficial venous insufficiency.  If interested she can wear knee-high compression, focus on elevating her legs above the level of her heart periodically throughout the day, and avoid prolonged sitting and standing.  She also experienced bleeding from spider veins in her left lateral lower  leg.  This area has healed and does not show any signs for recurrent bleeding.  Should she experience another bleeding episode she will call our office for potential sclerotherapy.  For now she will follow-up on an as-needed basis.   Emilie Rutter, PA-C [facility] [phone]  Clinic MD:   [facility]

## 2023-02-18 ENCOUNTER — Other Ambulatory Visit: Payer: Self-pay | Admitting: Cardiology

## 2023-03-14 ENCOUNTER — Telehealth: Payer: Self-pay | Admitting: *Deleted

## 2023-03-14 NOTE — Telephone Encounter (Signed)
Pre-operative Risk Assessment    Patient Name: [name]  DOB: February 02, 1966 MRN: [id number]  DATE OF LAST VISIT: 11/20/22 Gillian Shields, NP DATE OF NEXT VISIT: 03/15/23 DR. HOCHREIN   Request for Surgical Clearance    Procedure:  Dental Extraction - Amount of Teeth to be Pulled:  5 TEETH SURGICALLY EXTRACTED   Date of Surgery:  Clearance TBD                                 Surgeon:  DR. Shellia Carwin, DDS Surgeon's Group or Practice Name:  [facility] Phone number:  [phone] Fax number:  [phone]   Type of Clearance Requested:   - Medical  - Pharmacy:  Hold Aspirin and Clopidogrel (Plavix)     Type of Anesthesia:   LIDOCAINE, MARCAINE, MIDAZOLAM, FENTANYL   Additional requests/questions:    Elpidio Anis   03/14/2023, 1:11 PM

## 2023-03-14 NOTE — Telephone Encounter (Signed)
I will update all parties involved pt has appt 03/15/23 with Dr. Antoine Poche. I will add need pre op clearance to appt notes.

## 2023-03-14 NOTE — Telephone Encounter (Signed)
Preoperative team, patient scheduled to see Dr. Antoine Poche tomorrow.  Please add preoperative cardiac evaluation to appointment notes.  I will defer preoperative cardiac evaluation to Dr. Antoine Poche at that time.  He will also be able to provide recommendations for holding aspirin and Plavix.  Thank you for your help.  I will remove patient from preoperative pool.  [name]. Aaniyah Strohm NP-C     03/14/2023, 2:07 PM [facility] [address] Office [phone] Fax [phone]

## 2023-03-14 NOTE — Progress Notes (Unsigned)
Cardiology Office Note:   Date:  03/15/2023  ID:  [name], [name] 1965-05-23, MRN [id number] PCP: Courtney Paris, NP  [facility] Providers Cardiologist:  Rollene Rotunda, MD {  History of Present Illness:   [name] is a 57 y.o. female who presents for follow up of coronary artery disease and history of CABG. She underwent coronary angiography in 2014 and all of her bypass grafts were patent at that time.  She was in the hospital in [place] had chest pain and she had a cath with disease as listed below.  However, she had a CVA as a result of the cath.   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  She is going to have oral surgery to remove some teeth and needs preop clearance.  She has not had any cardiovascular complaints.  She is walking for exercise. The patient denies any new symptoms such as chest discomfort, neck or arm discomfort. There has been no new shortness of breath, PND or orthopnea. There have been no reported palpitations, presyncope or syncope.   Of note she does mention to me that she has got elevated liver enzymes and is seen a gastroenterologist and is told that she has some abnormality with her liver.  I do not have access to these records.  ROS: As stated in the HPI and negative for all other systems.  Studies Reviewed:    EKG:   EKG Interpretation Date/Time:  Thursday March 15 2023 10:20:30 EDT Ventricular Rate:  53 PR Interval:  206 QRS Duration:  90 QT Interval:  416 QTC Calculation: 390 R Axis:   52  Text Interpretation: Sinus bradycardia Nonspecific T wave abnormality When compared with ECG of 20-Nov-2022 10:08, T wave inversion less evident in Anterior leads Confirmed by Rollene Rotunda ([id number]) on 03/15/2023 10:42:21 AM    Risk Assessment/Calculations:             Physical  Exam:   VS:  BP 110/78   Pulse (!) 53   Ht 5\' 4"  (1.626 m)   Wt 226 lb 12.8 oz (102.9 kg)   LMP 09/22/2019   SpO2 99%   BMI 38.93 kg/m    Wt Readings from Last 3 Encounters:  03/15/23 226 lb 12.8 oz (102.9 kg)  02/06/23 227 lb 12.8 oz (103.3 kg)  11/20/22 232 lb (105.2 kg)     GEN: Well nourished, well developed in no acute distress NECK: No JVD; No carotid bruits CARDIAC: RRR, no murmurs, rubs, gallops RESPIRATORY:  Clear to auscultation without rales, wheezing or rhonchi  ABDOMEN: Soft, non-tender, non-distended EXTREMITIES:  No edema; No deformity   ASSESSMENT AND PLAN:   CAD:   The patient has no new sypmtoms.  No further cardiovascular testing is indicated.  We will continue with aggressive risk reduction and meds as listed.   HTN: Her blood pressure is at target.  No change in therapy.  Dyslipidemia:   She missed a lipid profile and I will have her come back to get this done.   She reports some elevated liver enzymes but she is not on a statin.  I will defer follow-up of that to her gastroenterologist.  I will check an LP(a) when she gets her lipid profile.  Her LDL should be in the 50s.    DM:   She is following with an endocrinologist.  I do not have her most recent A1c which she says is around 7.0 which would be an improvement.   CVA:   She has had complete resolution of her symptoms.  No change in therapy.  She needs continued risk reduction.   Preop: The patient will be at acceptable risk for the planned oral surgery.  I would like her not to discontinue her aspirin unless it is absolutely prohibitive for this procedure.       Follow up with me in one year in [place], Rollene Rotunda, MD

## 2023-03-15 ENCOUNTER — Ambulatory Visit: Payer: Medicare Other | Attending: Cardiology | Admitting: Cardiology

## 2023-03-15 ENCOUNTER — Encounter: Payer: Self-pay | Admitting: Cardiology

## 2023-03-15 VITALS — BP 110/78 | HR 53 | Ht 64.0 in | Wt 226.8 lb

## 2023-03-15 DIAGNOSIS — I25708 Atherosclerosis of coronary artery bypass graft(s), unspecified, with other forms of angina pectoris: Secondary | ICD-10-CM | POA: Diagnosis present

## 2023-03-15 DIAGNOSIS — Z01818 Encounter for other preprocedural examination: Secondary | ICD-10-CM | POA: Insufficient documentation

## 2023-03-15 DIAGNOSIS — E118 Type 2 diabetes mellitus with unspecified complications: Secondary | ICD-10-CM | POA: Insufficient documentation

## 2023-03-15 DIAGNOSIS — E785 Hyperlipidemia, unspecified: Secondary | ICD-10-CM | POA: Insufficient documentation

## 2023-03-15 DIAGNOSIS — I10 Essential (primary) hypertension: Secondary | ICD-10-CM | POA: Diagnosis present

## 2023-03-15 NOTE — Patient Instructions (Signed)
  Lab Work: Lipid and liver profile. FASTING. Can be done at [facility]. If you have labs (blood work) drawn today and your tests are completely normal, you will receive your results only by: MyChart Message (if you have MyChart) OR A paper copy in the mail If you have any lab test that is abnormal or we need to change your treatment, we will call you to review the results.     Follow-Up: At [facility], you and your health needs are our priority.  As part of our continuing mission to provide you with exceptional heart care, we have created designated Provider Care Teams.  These Care Teams include your primary Cardiologist (physician) and Advanced Practice Providers (APPs -  Physician Assistants and Nurse Practitioners) who all work together to provide you with the care you need, when you need it.  We recommend signing up for the patient portal called "MyChart".  Sign up information is provided on this After Visit Summary.  MyChart is used to connect with patients for Virtual Visits (Telemedicine).  Patients are able to view lab/test results, encounter notes, upcoming appointments, etc.  Non-urgent messages can be sent to your provider as well.   To learn more about what you can do with MyChart, go to [email or web address].    Your next appointment:   12 month(s)  Provider:   Rollene Rotunda, MD

## 2023-03-19 ENCOUNTER — Other Ambulatory Visit: Payer: Self-pay | Admitting: Cardiology

## 2023-03-20 ENCOUNTER — Encounter: Payer: Self-pay | Admitting: Nurse Practitioner

## 2023-03-20 ENCOUNTER — Ambulatory Visit (INDEPENDENT_AMBULATORY_CARE_PROVIDER_SITE_OTHER): Payer: Medicare Other | Admitting: Nurse Practitioner

## 2023-03-20 VITALS — BP 109/60 | HR 60 | Temp 98.1°F | Ht 64.0 in | Wt 227.0 lb

## 2023-03-20 DIAGNOSIS — E039 Hypothyroidism, unspecified: Secondary | ICD-10-CM | POA: Diagnosis not present

## 2023-03-20 DIAGNOSIS — Z1231 Encounter for screening mammogram for malignant neoplasm of breast: Secondary | ICD-10-CM

## 2023-03-20 DIAGNOSIS — E782 Mixed hyperlipidemia: Secondary | ICD-10-CM

## 2023-03-20 DIAGNOSIS — I10 Essential (primary) hypertension: Secondary | ICD-10-CM | POA: Diagnosis not present

## 2023-03-20 DIAGNOSIS — Z23 Encounter for immunization: Secondary | ICD-10-CM

## 2023-03-20 DIAGNOSIS — E088 Diabetes mellitus due to underlying condition with unspecified complications: Secondary | ICD-10-CM | POA: Insufficient documentation

## 2023-03-20 DIAGNOSIS — J4530 Mild persistent asthma, uncomplicated: Secondary | ICD-10-CM

## 2023-03-20 DIAGNOSIS — K219 Gastro-esophageal reflux disease without esophagitis: Secondary | ICD-10-CM

## 2023-03-20 DIAGNOSIS — Z0001 Encounter for general adult medical examination with abnormal findings: Secondary | ICD-10-CM

## 2023-03-20 DIAGNOSIS — Z794 Long term (current) use of insulin: Secondary | ICD-10-CM

## 2023-03-20 LAB — LIPID PANEL
Chol/HDL Ratio: 4.3 ratio (ref 0.0–4.4)
Cholesterol, Total: 163 mg/dL (ref 100–199)
HDL: 38 mg/dL — ABNORMAL LOW (ref >39)
LDL Chol Calc (NIH): 100 mg/dL — ABNORMAL HIGH (ref 0–99)
Triglycerides: 141 mg/dL (ref 0–149)
VLDL Cholesterol Cal: 25 mg/dL (ref 5–40)

## 2023-03-20 LAB — LIPOPROTEIN A (LPA): Lipoprotein (a): <8.4 nmol/L (ref <75.0)

## 2023-03-20 LAB — BAYER DCA HB A1C WAIVED: HB A1C (BAYER DCA - WAIVED): 7.9 % — ABNORMAL HIGH (ref 4.8–5.6)

## 2023-03-20 NOTE — Progress Notes (Addendum)
 New Patient Office Visit  Subjective    Patient ID: Kim  [name], female    DOB: 27-Aug-1965  Age: 57 y.o. MRN: [id number]  CC:  Chief Complaint  Patient presents with   Establish Care    HPI Kim  [name] 57 year old female presents March 20, 2023 for to establish care.  No concern Past medical history of MI, CVA, type 1 diabetes, hypertension, GERD. Diabetes Type 2: History of type 2 diabetes currently under the care of endocrine.  Use of insulin  pump.  Site is clean with no redness sign of infection.  Will order micro in A1c today.  Client reports that A1c has been running high and has been working with endocrine to lower A1c.  Client's currently on Lantus, Humalog, Ozempic, Hypothyroidism: Past history of hypothyroidism and has been managed with 137 mcg of Synthroid , however client reports that seem to has not been checked for at least a year.  She is requesting refill advised to since we are going to check thyroid  today to wait until tomorrow when lab result is back to get her medication refilled to make sure that she is still on the appropriate dose and she agrees, she denies heat and cold intolerance weight gain hair loss loss.  Hypertension well-managed hypertension, controlled with daily metoprolol . Blood pressure readings have been stable, averaging 120/80 mmHg] over the past  3 months]. The patient reports no new symptoms, side effects, or issues related to their hypertension. Regular follow-ups and lab tests are up-to-date, with no recent changes in medication required.   Outpatient Encounter Medications as of 03/20/2023  Medication Sig   albuterol  (VENTOLIN  HFA) 108 (90 Base) MCG/ACT inhaler Inhale 2 puffs into the lungs every 6 (six) hours as needed for wheezing or shortness of breath.   ALPRAZolam  (XANAX ) 0.5 MG tablet Take 0.25 mg by mouth 2 (two) times daily as needed.   aspirin  81 MG chewable tablet Chew 1 tablet (81 mg total) by mouth daily.    Budeson-Glycopyrrol-Formoterol  (BREZTRI  AEROSPHERE) 160-9-4.8 MCG/ACT AERO Inhale 2 puffs into the lungs in the morning and at bedtime.   clopidogrel  (PLAVIX ) 75 MG tablet TAKE 1 TABLET BY MOUTH EVERY DAY   Evolocumab  (REPATHA  SURECLICK) 140 MG/ML SOAJ Inject 140 mg into the skin every 14 (fourteen) days.   furosemide  (LASIX ) 40 MG tablet Take 1 tablet (40 mg total) by mouth daily as needed.   HUMALOG 100 UNIT/ML injection SMARTSIG:0-300 Unit(s) SUB-Q Daily   lansoprazole  (PREVACID  SOLUTAB) 30 MG disintegrating tablet Take 1 tablet (30 mg total) by mouth daily.   levothyroxine  (SYNTHROID ) 137 MCG tablet Take 1 tablet (137 mcg total) by mouth daily before breakfast.   metoprolol  tartrate (LOPRESSOR ) 25 MG tablet Take 1.5 tablets (37.5 mg total) by mouth 2 (two) times daily.   nitroGLYCERIN  (NITROSTAT ) 0.4 MG SL tablet Place 1 tablet (0.4 mg total) under the tongue every 5 minutes x 3 doses as needed for chest pain (if no relief after 2nd dose, proceed to the ED/call 911   oxyCODONE -acetaminophen  (PERCOCET/ROXICET) 5-325 MG tablet Take 1 tablet by mouth 3 (three) times daily as needed.   Semaglutide,0.25 or 0.5MG /DOS, 2 MG/3ML SOPN Inject 0.25 mg into the skin once a week.   TRESIBA FLEXTOUCH 100 UNIT/ML FlexTouch Pen Inject into the skin daily.   fluticasone  (FLONASE ) 50 MCG/ACT nasal spray Place 1 spray into both nostrils daily for 7 days.   triamcinolone ointment (KENALOG) 0.1 % Apply 1 application topically daily as needed. (Patient not taking: Reported on  03/20/2023)   No facility-administered encounter medications on file as of 03/20/2023.    Past Medical History:  Diagnosis Date   Anxiety    Asthma    Blood transfusion without reported diagnosis    2010 CABG   CAD (coronary artery disease)    4v CABG, 9/10; NL LVF, status post followup Cardiolite November 2012 no ischemia ejection fraction 65%   Cataract    are forming   Diabetes mellitus    Dyslipidemia     LDL 22 mg percent on  Crestor    Esophageal stricture    Fatty liver    Gastroparesis due to DM (HCC)    GERD (gastroesophageal reflux disease)    Glaucoma    Hyperlipidemia    Hypertension    Hypothyroidism    Knee injury, right, initial encounter    Left-sided chest wall pain    Myocardial infarction (HCC) 2010   2008 x 2 stents, 2009 x 2 stents. 01-2009 CABG x 4    Stroke [facility])     Past Surgical History:  Procedure Laterality Date   ANGIOPLASTY     with stenting 2008,2009   CARDIAC SURGERY     CESAREAN SECTION     CORONARY ARTERY BYPASS GRAFT  01/26/2009   2008-2 stents; 2009-2 stents   EYE SURGERY     IR CT HEAD LTD  01/03/2022   IR PERCUTANEOUS ART THROMBECTOMY/INFUSION INTRACRANIAL INC DIAG ANGIO  01/03/2022   IR US  GUIDE VASC ACCESS RIGHT  01/03/2022   LEFT HEART CATH AND CORS/GRAFTS ANGIOGRAPHY N/A 01/03/2022   Procedure: LEFT HEART CATH AND CORS/GRAFTS ANGIOGRAPHY;  Surgeon: Burnard Debby LABOR, MD;  Location: [facility] INVASIVE CV LAB;  Service: Cardiovascular;  Laterality: N/A;   open heart surgery   2010   RADIOLOGY WITH ANESTHESIA N/A 01/03/2022   Procedure: IR WITH ANESTHESIA;  Surgeon: Radiologist, Medication, MD;  Location: [facility] OR;  Service: Radiology;  Laterality: N/A;   TUBAL LIGATION      Family History  Problem Relation Age of Onset   Diabetes Mother    Heart disease Father    Asthma Father    COPD Father    Asthma Son    Asthma Paternal Aunt    Colon cancer Neg Hx    Colon polyps Neg Hx    Esophageal cancer Neg Hx    Rectal cancer Neg Hx    Stomach cancer Neg Hx     Social History   Socioeconomic History   Marital status: Single    Spouse name: Not on file   Number of children: 1   Years of education: 12   Highest education level: Not on file  Occupational History   Occupation: disabilied  Tobacco Use   Smoking status: Former    Current packs/day: 0.00    Average packs/day: 1 pack/day for 3.0 years (3.0 ttl pk-yrs)    Types: Cigarettes    Start date: 03/14/1991    Quit  date: 05/22/1993    Years since quitting: 29.8   Smokeless tobacco: Never   Tobacco comments:     Year Quit: 1995  Vaping Use   Vaping status: Never Used  Substance and Sexual Activity   Alcohol use: No    Alcohol/week: 0.0 standard drinks of alcohol   Drug use: No   Sexual activity: Yes    Birth control/protection: Surgical, Post-menopausal    Comment: tubal  Other Topics Concern   Not on file  Social History Narrative   [facility] (03/19/17):  Originally from [place], [place]. Has always lived in [place]. Previously worked at [employer]. She is disabled due to her prior cardiac history. 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 No pets currently. No bird exposure. She reports she did have mold in a prior home that she lived in for 3 years. She has since moved to a new home, lives alone. She enjoys walking.    Social Determinants of Health   Financial Resource Strain: Medium Risk (12/14/2020)   Overall Financial Resource Strain (CARDIA)    Difficulty of Paying Living Expenses: Somewhat hard  Food Insecurity: No Food Insecurity (03/20/2023)   Hunger Vital Sign    Worried About Running Out of Food in the Last Year: Never true    Ran Out of Food in the Last Year: Never true  Transportation Needs: No Transportation Needs (03/20/2023)   PRAPARE - Administrator, Civil Service (Medical): No    Lack of Transportation (Non-Medical): No  Physical Activity: Insufficiently Active (12/14/2020)   Exercise Vital Sign    Days of Exercise per Week: 3 days    Minutes of Exercise per Session: 30 min  Stress: No Stress Concern Present (12/14/2020)   [employer] of Occupational Health - Occupational Stress Questionnaire    Feeling of Stress : Only a little  Social Connections: Unknown (09/30/2021)   [facility] from [facility]   Social Network    Social Network: Not on file  Intimate Partner Violence: Not At Risk  (03/20/2023)   Humiliation, Afraid, Rape, and Kick questionnaire    Fear of Current or Ex-Partner: No    Emotionally Abused: No    Physically Abused: No    Sexually Abused: No    Review of Systems  Constitutional:  Negative for chills and fever.  HENT:  Negative for congestion and sore throat.   Eyes:  Negative for pain.  Respiratory:  Negative for cough and shortness of breath.   Cardiovascular:  Negative for chest pain and leg swelling.  Gastrointestinal:  Negative for blood in stool, melena, nausea and vomiting.  Genitourinary:  Negative for dysuria.  Musculoskeletal:  Negative for falls and myalgias.  Neurological:  Negative for dizziness and headaches.  Endo/Heme/Allergies:  Negative for environmental allergies and polydipsia. Does not bruise/bleed easily.  Psychiatric/Behavioral:  The patient does not have insomnia.    Negative unless indicated in HPI   Objective    BP 109/60   Pulse 60   Temp 98.1 F (36.7 C) (Temporal)   Ht 5' 4 (1.626 m)   Wt 227 lb (103 kg)   LMP 09/22/2019   SpO2 97%   BMI 38.96 kg/m   Physical Exam Vitals and nursing note reviewed.  Constitutional:      Appearance: She is obese.  HENT:     Head: Normocephalic and atraumatic.  Eyes:     General: No scleral icterus.    Extraocular Movements: Extraocular movements intact.     Conjunctiva/sclera: Conjunctivae normal.     Pupils: Pupils are equal, round, and reactive to light.  Cardiovascular:     Rate and Rhythm: Normal rate and regular rhythm.  Pulmonary:     Effort: Pulmonary effort is normal.     Breath sounds: Normal breath sounds.  Abdominal:     General: Bowel sounds are normal. There is no distension.     Palpations: Abdomen is soft. There is no mass.     Tenderness: There is  no abdominal tenderness. There is no guarding or rebound.     Hernia: No hernia is present.  Musculoskeletal:        General: Normal range of motion.     Cervical back: Normal range of motion and neck  supple.     Right lower leg: No edema.     Left lower leg: No edema.  Skin:    General: Skin is warm and dry.     Findings: No rash.  Neurological:     Mental Status: She is alert and oriented to person, place, and time. Mental status is at baseline.  Psychiatric:        Behavior: Behavior normal.        Thought Content: Thought content normal.     Last CBC Lab Results  Component Value Date   WBC 10.3 10/10/2022   HGB 13.4 10/10/2022   HCT 41.6 10/10/2022   MCV 86 10/10/2022   MCH 27.7 10/10/2022   RDW 14.6 10/10/2022   PLT 326 10/10/2022   Last metabolic panel Lab Results  Component Value Date   GLUCOSE 181 (H) 02/05/2022   NA 137 02/05/2022   K 4.1 02/05/2022   CL 102 02/05/2022   CO2 27 02/05/2022   BUN 8 02/05/2022   CREATININE 0.68 02/05/2022   GFRNONAA >60 02/05/2022   CALCIUM  9.7 02/05/2022   PHOS 3.0 01/05/2022   PROT 7.0 01/04/2022   ALBUMIN 3.3 (L) 01/04/2022   BILITOT 0.7 01/04/2022   ALKPHOS 137 (H) 01/04/2022   AST 20 01/04/2022   ALT 17 01/04/2022   ANIONGAP 8 02/05/2022   Last lipids Lab Results  Component Value Date   CHOL 163 03/19/2023   HDL 38 (L) 03/19/2023   LDLCALC 100 (H) 03/19/2023   TRIG 141 03/19/2023   CHOLHDL 4.3 03/19/2023   Last hemoglobin A1c Lab Results  Component Value Date   HGBA1C 7.9 (H) 03/20/2023   Last thyroid  functions Lab Results  Component Value Date   TSH 2.11 06/12/2012   T3TOTAL 79.9 (L) 06/12/2012   T4TOTAL 10.5 06/12/2012        Assessment & Plan:  Gastroesophageal reflux disease, unspecified whether esophagitis present -     CBC with Differential/Platelet  Diabetes mellitus due to underlying condition with complication, with long-term current use of insulin  (HCC) -     Bayer DCA Hb A1c Waived -     Microalbumin / creatinine urine ratio  Acquired hypothyroidism -     Thyroid  Panel With TSH  Mixed hyperlipidemia -     Thyroid  Panel With TSH  Primary hypertension -     CMP14+EGFR  Mild  persistent asthma without complication -     CBC with Differential/Platelet -     CMP14+EGFR  Screening mammogram for breast cancer -     3D Screening Mammogram, Left and Right  Need for shingles vaccine -     Varicella-zoster vaccine IM  Encounter for general adult medical examination with abnormal findings -     CBC with Differential/Platelet -     CMP14+EGFR -     Thyroid  Panel With TSH  Diabetes mellitus type 2, insulin  dependent (HCC)  [name]  is a 57 year old Caucasian female no acute distress Vacc: Zoster Labs: CBC, CMP, TSH, lipid Mammogram ordered   HTN: BP well controlled. No Changes  made in regimen of medications. Goal BP is 130/80. Pt aware to report any persistent high or low readings. DASH diet and exercise encouraged. Exercise at least 150 minutes  per week and increase as tolerated. Goal BMI > 25. Stress management encouraged. Avoid nicotine and tobacco product use. Avoid excessive alcohol and NSAID's. Avoid more than 2000 mg of sodium daily. Medications as prescribed. Follow up as scheduled.    Thyroid  disease has been with Synthroid  137 mcg controlled. Labs are pending. Adjustments to regimen will be made if warranted. Make sure to take medications on an empty stomach with a full glass of water. Make sure to avoid vitamins or supplements for at least 4 hours before and 4 hours after taking medications. Repeat labs in 3 months if adjustments are made and in 6 months if stable.    DM: Microalbumin, diabetic foot exam, A1c ordered Continue to monitor your blood sugars as we discussed and record them. Bring the log to your next appointment.  Take your medications as directed.    Goal Blood glucose:    Fasting (before meals) = 80 to 130   Within 2 hours of eating = less than 180   Understanding your Hemoglobin A1c:     Diabetes Mellitus and Nutrition    I think that you would greatly benefit from seeing a nutritionist. If this is something you are interested  in, please call Dr Wonda at [phone] to schedule an appointment.   When you have diabetes (diabetes mellitus), it is very important to have healthy eating habits because your blood sugar (glucose) levels are greatly affected by what you eat and drink. Eating healthy foods in the appropriate amounts, at about the same times every day, can help you: Control your blood glucose. Lower your risk of heart disease. Improve your blood pressure. Reach or maintain a healthy weight.  Every person with diabetes is different, and each person has different needs for a meal plan. Your health care provider may recommend that you work with a diet and nutrition specialist (dietitian) to make a meal plan that is best for you. Your meal plan may vary depending on factors such as: The calories you need. The medicines you take. Your weight. Your blood glucose, blood pressure, and cholesterol levels. Your activity level. Other health conditions you have, such as heart or kidney disease.  How do carbohydrates affect me? Carbohydrates affect your blood glucose level more than any other type of food. Eating carbohydrates naturally increases the amount of glucose in your blood. Carbohydrate counting is a method for keeping track of how many carbohydrates you eat. Counting carbohydrates is important to keep your blood glucose at a healthy level, especially if you use insulin  or take certain oral diabetes medicines. It is important to know how many carbohydrates you can safely have in each meal. This is different for every person. Your dietitian can help you calculate how many carbohydrates you should have at each meal and for snack. Foods that contain carbohydrates include: Bread, cereal, rice, pasta, and crackers. Potatoes and corn. Peas, beans, and lentils. Milk and yogurt. Fruit and juice. Desserts, such as cakes, cookies, ice cream, and candy.  How does alcohol affect me? Alcohol can cause a sudden decrease  in blood glucose (hypoglycemia), especially if you use insulin  or take certain oral diabetes medicines. Hypoglycemia can be a life-threatening condition. Symptoms of hypoglycemia (sleepiness, dizziness, and confusion) are similar to symptoms of having too much alcohol. If your health care provider says that alcohol is safe for you, follow these guidelines: Limit alcohol intake to no more than 1 drink per day for nonpregnant women and 2 drinks per day for men.  One drink equals 12 oz of beer, 5 oz of wine, or 1 oz of hard liquor. Do not drink on an empty stomach. Keep yourself hydrated with water, diet soda, or unsweetened iced tea. Keep in mind that regular soda, juice, and other mixers may contain a lot of sugar and must be counted as carbohydrates.  What are tips for following this plan?  Reading food labels Start by checking the serving size on the label. The amount of calories, carbohydrates, fats, and other nutrients listed on the label are based on one serving of the food. Many foods contain more than one serving per package. Check the total grams (g) of carbohydrates in one serving. You can calculate the number of servings of carbohydrates in one serving by dividing the total carbohydrates by 15. For example, if a food has 30 g of total carbohydrates, it would be equal to 2 servings of carbohydrates. Check the number of grams (g) of saturated and trans fats in one serving. Choose foods that have low or no amount of these fats. Check the number of milligrams (mg) of sodium in one serving. Most people should limit total sodium intake to less than 2,300 mg per day. Always check the nutrition information of foods labeled as low-fat or nonfat. These foods may be higher in added sugar or refined carbohydrates and should be avoided. Talk to your dietitian to identify your daily goals for nutrients listed on the label.  Shopping Avoid buying canned, premade, or processed foods. These foods tend  to be high in fat, sodium, and added sugar. Shop around the outside edge of the grocery store. This includes fresh fruits and vegetables, bulk grains, fresh meats, and fresh dairy.  Cooking Use low-heat cooking methods, such as baking, instead of high-heat cooking methods like deep frying. Cook using healthy oils, such as olive, canola, or sunflower oil. Avoid cooking with butter, cream, or high-fat meats.  Meal planning Eat meals and snacks regularly, preferably at the same times every day. Avoid going long periods of time without eating. Eat foods high in fiber, such as fresh fruits, vegetables, beans, and whole grains. Talk to your dietitian about how many servings of carbohydrates you can eat at each meal. Eat 4-6 ounces of lean protein each day, such as lean meat, chicken, fish, eggs, or tofu. 1 ounce is equal to 1 ounce of meat, chicken, or fish, 1 egg, or 1/4 cup of tofu. Eat some foods each day that contain healthy fats, such as avocado, nuts, seeds, and fish.  Lifestyle  Check your blood glucose regularly. Exercise at least 30 minutes 5 or more days each week, or as told by your health care provider. Take medicines as told by your health care provider. Do not use any products that contain nicotine or tobacco, such as cigarettes and e-cigarettes. If you need help quitting, ask your health care provider. Work with a Veterinary surgeon or diabetes educator to identify strategies to manage stress and any emotional and social challenges.  What are some questions to ask my health care provider? Do I need to meet with a diabetes educator? Do I need to meet with a dietitian? What number can I call if I have questions? When are the best times to check my blood glucose?  Where to find more information: American Diabetes Association: diabetes.org/food-and-fitness/food Academy of Nutrition and Dietetics: https://www.vargas.com/ [employer] of  Diabetes and Digestive and Kidney Diseases (NIH): FindJewelers.cz  Summary A healthy meal plan will help you control your  blood glucose and maintain a healthy lifestyle. Working with a diet and nutrition specialist (dietitian) can help you make a meal plan that is best for you. Keep in mind that carbohydrates and alcohol have immediate effects on your blood glucose levels. It is important to count carbohydrates and to use alcohol carefully. This information is not intended to replace advice given to you by your health care provider. Make sure you discuss any questions you have with your health care provider. Document Released: 02/02/2005 Document Revised: 06/12/2016 Document Reviewed: 06/12/2016 Elsevier Interactive Patient Education  2018 [employer].    Encourage healthy lifestyle choices, including diet (rich in fruits, vegetables, and lean proteins, and low in salt and simple carbohydrates) and exercise (at least 30 minutes of moderate physical activity daily).     The above assessment and management plan was discussed with the patient. The patient verbalized understanding of and has agreed to the management plan. Patient is aware to call the clinic if they develop any new symptoms or if symptoms persist or worsen. Patient is aware when to return to the clinic for a follow-up visit. Patient educated on when it is appropriate to go to the emergency department.   Return in about 3 months (around 06/20/2023) for follow-up.   Kim Kohn [place], DNP [facility] [address] [place], [place] [address] [phone]

## 2023-03-21 ENCOUNTER — Other Ambulatory Visit: Payer: Self-pay | Admitting: Nurse Practitioner

## 2023-03-21 DIAGNOSIS — E039 Hypothyroidism, unspecified: Secondary | ICD-10-CM

## 2023-03-21 LAB — CMP14+EGFR
ALT: 13 [IU]/L (ref 0–32)
AST: 18 [IU]/L (ref 0–40)
Albumin: 4.1 g/dL (ref 3.8–4.9)
Alkaline Phosphatase: 197 [IU]/L — ABNORMAL HIGH (ref 44–121)
BUN/Creatinine Ratio: 10 (ref 9–23)
BUN: 6 mg/dL (ref 6–24)
Bilirubin Total: 0.4 mg/dL (ref 0.0–1.2)
CO2: 24 mmol/L (ref 20–29)
Calcium: 10.2 mg/dL (ref 8.7–10.2)
Chloride: 101 mmol/L (ref 96–106)
Creatinine, Ser: 0.59 mg/dL (ref 0.57–1.00)
Globulin, Total: 2.5 g/dL (ref 1.5–4.5)
Glucose: 162 mg/dL — ABNORMAL HIGH (ref 70–99)
Potassium: 4.2 mmol/L (ref 3.5–5.2)
Sodium: 140 mmol/L (ref 134–144)
Total Protein: 6.6 g/dL (ref 6.0–8.5)
eGFR: 106 mL/min/{1.73_m2} (ref >59)

## 2023-03-21 LAB — CBC WITH DIFFERENTIAL/PLATELET
Basophils Absolute: 0.1 10*3/uL (ref 0.0–0.2)
Basos: 1 %
EOS (ABSOLUTE): 0.2 10*3/uL (ref 0.0–0.4)
Eos: 2 %
Hematocrit: 42.8 % (ref 34.0–46.6)
Hemoglobin: 13.5 g/dL (ref 11.1–15.9)
Immature Grans (Abs): 0 10*3/uL (ref 0.0–0.1)
Immature Granulocytes: 0 %
Lymphocytes Absolute: 2.6 10*3/uL (ref 0.7–3.1)
Lymphs: 28 %
MCH: 27.6 pg (ref 26.6–33.0)
MCHC: 31.5 g/dL (ref 31.5–35.7)
MCV: 87 fL (ref 79–97)
Monocytes Absolute: 0.4 10*3/uL (ref 0.1–0.9)
Monocytes: 5 %
Neutrophils Absolute: 6 10*3/uL (ref 1.4–7.0)
Neutrophils: 64 %
Platelets: 293 10*3/uL (ref 150–450)
RBC: 4.9 x10E6/uL (ref 3.77–5.28)
RDW: 14.3 % (ref 11.7–15.4)
WBC: 9.2 10*3/uL (ref 3.4–10.8)

## 2023-03-21 LAB — MICROALBUMIN / CREATININE URINE RATIO
Creatinine, Urine: 12.4 mg/dL
Microalb/Creat Ratio: 30 mg/g{creat} — ABNORMAL HIGH (ref 0–29)
Microalbumin, Urine: 3.7 ug/mL

## 2023-03-21 LAB — THYROID PANEL WITH TSH
Free Thyroxine Index: 3.2 (ref 1.2–4.9)
T3 Uptake Ratio: 26 % (ref 24–39)
T4, Total: 12.3 ug/dL — ABNORMAL HIGH (ref 4.5–12.0)
TSH: 0.484 u[IU]/mL (ref 0.450–4.500)

## 2023-03-21 MED ORDER — LEVOTHYROXINE SODIUM 137 MCG PO TABS: 137.0000 ug | ORAL_TABLET | Freq: Every day | ORAL | 0 refills | Status: DC

## 2023-03-22 ENCOUNTER — Telehealth: Payer: Self-pay | Admitting: *Deleted

## 2023-03-22 DIAGNOSIS — E785 Hyperlipidemia, unspecified: Secondary | ICD-10-CM

## 2023-03-22 MED ORDER — EZETIMIBE 10 MG PO TABS: 10.0000 mg | ORAL_TABLET | Freq: Every day | ORAL | 3 refills | Status: DC

## 2023-03-22 NOTE — Telephone Encounter (Signed)
-----   Message from Rollene Rotunda sent at 03/21/2023  7:47 AM EDT ----- I would like to add Zetia 10 mg PO daily.  Repeat lipid profile in 3 months. Call Ms. Hendler with the results and send results to [facility], Dois Davenport, NP

## 2023-03-22 NOTE — Telephone Encounter (Signed)
Spoke with pt, aware of lab results and recommendations.  New script sent to the pharmacy  Lab orders mailed to the pt

## 2023-04-23 ENCOUNTER — Other Ambulatory Visit (HOSPITAL_BASED_OUTPATIENT_CLINIC_OR_DEPARTMENT_OTHER): Payer: Self-pay | Admitting: Family

## 2023-04-23 ENCOUNTER — Other Ambulatory Visit: Payer: Self-pay | Admitting: Cardiology

## 2023-04-23 DIAGNOSIS — R600 Localized edema: Secondary | ICD-10-CM

## 2023-04-27 ENCOUNTER — Encounter (HOSPITAL_COMMUNITY): Payer: Self-pay

## 2023-04-27 ENCOUNTER — Emergency Department (HOSPITAL_COMMUNITY): Payer: No Typology Code available for payment source

## 2023-04-27 ENCOUNTER — Other Ambulatory Visit: Payer: Self-pay

## 2023-04-27 ENCOUNTER — Emergency Department (HOSPITAL_COMMUNITY)
Admission: EM | Admit: 2023-04-27 | Discharge: 2023-04-27 | Disposition: A | Discharge status: Home / Self Care | Payer: No Typology Code available for payment source | Attending: Emergency Medicine | Admitting: Emergency Medicine

## 2023-04-27 DIAGNOSIS — Y92410 Unspecified street and highway as the place of occurrence of the external cause: Secondary | ICD-10-CM | POA: Insufficient documentation

## 2023-04-27 DIAGNOSIS — Z79899 Other long term (current) drug therapy: Secondary | ICD-10-CM | POA: Insufficient documentation

## 2023-04-27 DIAGNOSIS — Z8673 Personal history of transient ischemic attack (TIA), and cerebral infarction without residual deficits: Secondary | ICD-10-CM | POA: Insufficient documentation

## 2023-04-27 DIAGNOSIS — M542 Cervicalgia: Secondary | ICD-10-CM | POA: Insufficient documentation

## 2023-04-27 DIAGNOSIS — I10 Essential (primary) hypertension: Secondary | ICD-10-CM | POA: Diagnosis not present

## 2023-04-27 DIAGNOSIS — Z7982 Long term (current) use of aspirin: Secondary | ICD-10-CM | POA: Insufficient documentation

## 2023-04-27 DIAGNOSIS — I2510 Atherosclerotic heart disease of native coronary artery without angina pectoris: Secondary | ICD-10-CM | POA: Insufficient documentation

## 2023-04-27 DIAGNOSIS — M549 Dorsalgia, unspecified: Secondary | ICD-10-CM | POA: Insufficient documentation

## 2023-04-27 DIAGNOSIS — Z7902 Long term (current) use of antithrombotics/antiplatelets: Secondary | ICD-10-CM | POA: Diagnosis not present

## 2023-04-27 DIAGNOSIS — R519 Headache, unspecified: Secondary | ICD-10-CM | POA: Diagnosis not present

## 2023-04-27 MED ORDER — METHOCARBAMOL 500 MG PO TABS
500.0000 mg | ORAL_TABLET | Freq: Two times a day (BID) | ORAL | 0 refills | Status: AC
Start: 2023-04-27 — End: 2023-05-04
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
## 2023-05-16 ENCOUNTER — Other Ambulatory Visit: Payer: Self-pay | Admitting: Cardiology

## 2023-05-29 ENCOUNTER — Telehealth: Payer: Self-pay | Admitting: Nurse Practitioner

## 2023-06-13 NOTE — Progress Notes (Deleted)
 Established Patient Office Visit  Subjective  Patient ID: [name], female    DOB: 10-14-1965  Age: 58 y.o. MRN: [id number]  No chief complaint on file.   HPI Hypertension, follow-up  BP Readings from Last 3 Encounters:  04/27/23 (!) 119/45  03/20/23 109/60  03/15/23 110/78   Wt Readings from Last 3 Encounters:  04/27/23 223 lb (101.2 kg)  03/20/23 227 lb (103 kg)  03/15/23 226 lb 12.8 oz (102.9 kg)     She was last seen for hypertension {NUMBERS 1-12:18279} {days/wks/mos/yrs:310907} ago.  BP at that visit was ***. Management since that visit includes ***.  She reports {excellent/good/fair/poor:19665} compliance with treatment. She {is/is not:9024} having side effects. {document side effects if present:1} She is following a {diet:21022986} diet. She {is/is not:9024} exercising. She {does/does not:200015} smoke.  Use of agents associated with hypertension: {bp agents assoc with hypertension:511::"none"}.   Outside blood pressures are {***enter patient reported home BP readings, or 'not being checked':1}. Symptoms: {Yes/No:20286} chest pain {Yes/No:20286} chest pressure  {Yes/No:20286} palpitations {Yes/No:20286} syncope  {Yes/No:20286} dyspnea {Yes/No:20286} orthopnea  {Yes/No:20286} paroxysmal nocturnal dyspnea {Yes/No:20286} lower extremity edema   Pertinent labs Lab Results  Component Value Date   CHOL 163 03/19/2023   HDL 38 (L) 03/19/2023   LDLCALC 100 (H) 03/19/2023   TRIG 141 03/19/2023   CHOLHDL 4.3 03/19/2023   Lab Results  Component Value Date   NA 140 03/20/2023   K 4.2 03/20/2023   CREATININE 0.59 03/20/2023   EGFR 106 03/20/2023   GLUCOSE 162 (H) 03/20/2023   TSH 0.484 03/20/2023     The ASCVD Risk score (Arnett DK, et al., 2019) failed to calculate for the following reasons:   Risk score cannot be calculated because patient has a medical history suggesting prior/existing ASCVD  Lipid/Cholesterol, Follow-up  Last lipid panel Other  pertinent labs  Lab Results  Component Value Date   CHOL 163 03/19/2023   HDL 38 (L) 03/19/2023   LDLCALC 100 (H) 03/19/2023   TRIG 141 03/19/2023   CHOLHDL 4.3 03/19/2023   Lab Results  Component Value Date   ALT 13 03/20/2023   AST 18 03/20/2023   PLT 293 03/20/2023   TSH 0.484 03/20/2023     She was last seen for this {1-12:18279} {days/wks/mos/yrs:310907} ago.  Management since that visit includes ***.  She reports {excellent/good/fair/poor:19665} compliance with treatment. She {is/is not:9024} having side effects. {document side effects if present:1}  Symptoms: {Yes/No:20286} chest pain {Yes/No:20286} chest pressure/discomfort  {Yes/No:20286} dyspnea {Yes/No:20286} lower extremity edema  {Yes/No:20286} numbness or tingling of extremity {Yes/No:20286} orthopnea  {Yes/No:20286} palpitations {Yes/No:20286} paroxysmal nocturnal dyspnea  {Yes/No:20286} speech difficulty {Yes/No:20286} syncope   Current diet: {diet habits:16563} Current exercise: {exercise types:16438}  The ASCVD Risk score (Arnett DK, et al., 2019) failed to calculate for the following reasons:   Risk score cannot be calculated because patient has a medical history suggesting prior/existing ASCVD  GERD, Follow up:  The patient was last seen for GERD {NUMBERS 1-12:18279} {days/wks/mos/yrs:310907} ago. Changes made since that visit include ***.  She reports {excellent/good/fair/poor:19665} compliance with treatment. She {is/is not:21021397} having side effects. ***.  She IS experiencing {Sx; ge reflux:19417}. She is NOT experiencing {Sx; ge reflux:19417:o}  Hypothyroidism: Patient presents for f/u for Hypothyroidism current being managed with synthroid 75 mcg daily. Denies fatigue, weight changes, heat/cold intolerance, bowel/skin changes or CVS symptoms.   Previous thyroid studies include TSH 2.330. will continue current dose   Diabetes Mellitus Type II, Follow-up  Lab Results  Component Value  Date    HGBA1C 7.9 (H) 03/20/2023   HGBA1C 7.8 (H) 12/31/2021   HGBA1C 9.2 (H) 04/17/2019   Wt Readings from Last 3 Encounters:  04/27/23 223 lb (101.2 kg)  03/20/23 227 lb (103 kg)  03/15/23 226 lb 12.8 oz (102.9 kg)   Last seen for diabetes {1-12:18279} {days/wks/mos/yrs:310907} ago.  Management since then includes ***. She reports {excellent/good/fair/poor:19665} compliance with treatment. She {is/is not:21021397} having side effects. {document side effects if present:1} Symptoms: {Yes/No:20286} fatigue {Yes/No:20286} foot ulcerations  {Yes/No:20286} appetite changes {Yes/No:20286} nausea  {Yes/No:20286} paresthesia of the feet  {Yes/No:20286} polydipsia  {Yes/No:20286} polyuria {Yes/No:20286} visual disturbances   {Yes/No:20286} vomiting     Home blood sugar records: {diabetes glucometry results:16657}  Episodes of hypoglycemia? {Yes/No:20286} {enter symptoms and frequency of symptoms if yes:1}   Current insulin regiment: {enter 'none' or type of insulin and number of units taken with each dose of each insulin formulation that the patient is taking:1} Most Recent Eye Exam: *** {Current exercise:16438:::1} {Current diet habits:16563:::1}  Pertinent Labs: Lab Results  Component Value Date   CHOL 163 03/19/2023   HDL 38 (L) 03/19/2023   LDLCALC 100 (H) 03/19/2023   TRIG 141 03/19/2023   CHOLHDL 4.3 03/19/2023   Lab Results  Component Value Date   NA 140 03/20/2023   K 4.2 03/20/2023   CREATININE 0.59 03/20/2023   EGFR 106 03/20/2023   MICRALBCREAT 30 (H) 03/20/2023     ---------------------------------------------------------------------------------------------------  Patient Active Problem List   Diagnosis Date Noted   Diabetes mellitus due to underlying condition with complication, with long-term current use of insulin (HCC) 03/20/2023   Acute ischemic left MCA stroke (HCC) 01/03/2022   NSTEMI (non-ST elevated myocardial infarction) (HCC) 12/31/2021   Derangement of  right knee 11/27/2021   Screening mammogram for breast cancer 12/14/2020   Encounter for screening fecal occult blood testing 12/14/2020   Post-menopause 12/14/2020   Encounter for well woman exam with routine gynecological exam 12/14/2020   Acute respiratory failure with hypoxia (HCC) 04/17/2019   Uncontrolled type 1 diabetes mellitus with hypoglycemia without coma, with long-term current use of insulin (HCC) 04/17/2019   COVID-19 virus infection    Pneumonia due to COVID-19 virus 04/16/2019   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 (HCC) 03/01/2019   Acute bronchitis 07/18/2017   Oral thrush 04/19/2017   Chronic seasonal allergic rhinitis 03/19/2017   Diabetic retinopathy of both eyes without macular edema associated with type 2 diabetes mellitus (HCC) 08/30/2016   Mild persistent asthma without complication 07/06/2016   Diabetes mellitus with complication (HCC)    Atypical chest pain    Muscle spasm 08/20/2014   Plantar fasciitis of right foot 03/04/2014   Obesity 08/03/2013   Hx of CABG    Ejection fraction    HTN (hypertension) 08/22/2012   Adjustment disorder with mixed anxiety and depressed mood 03/05/2012   Insulin pump in place 02/06/2012   Hypothyroidism 09/29/2011   Edema 08/25/2011   Gastroparesis 09/07/2010   Vitamin D deficiency 08/10/2010   Hyperlipidemia 02/21/2010   Muscle spasm of left shoulder 04/29/2009   PALPITATIONS 04/12/2009   WHEEZING 04/12/2009   GERD 01/19/2009   Chest pain 02/04/2008   Diabetes mellitus type 2, insulin dependent (HCC) 06/12/2007   Anxiety disorder 06/12/2007   Coronary artery disease involving coronary bypass graft of native heart 06/12/2007   Asthma in adult 06/12/2007   Past Medical History:  Diagnosis Date   Anxiety    Asthma    Blood transfusion without  reported diagnosis    2010 CABG   CAD (coronary artery disease)    4v CABG, 9/10; NL LVF, status post followup Cardiolite November 2012 no  ischemia ejection fraction 65%   Cataract    are forming   Diabetes mellitus    Dyslipidemia     LDL 22 mg percent on Crestor   Esophageal stricture    Fatty liver    Gastroparesis due to DM (HCC)    GERD (gastroesophageal reflux disease)    Glaucoma    Hyperlipidemia    Hypertension    Hypothyroidism    Knee injury, right, initial encounter    Left-sided chest wall pain    Myocardial infarction [facility]) 2010   2008 x 2 stents, 2009 x 2 stents. 01-2009 CABG x 4    Stroke [facility])    Past Surgical History:  Procedure Laterality Date   ANGIOPLASTY     with stenting 2008,2009   CARDIAC SURGERY     CESAREAN SECTION     CORONARY ARTERY BYPASS GRAFT  01/26/2009   2008-2 stents; 2009-2 stents   EYE SURGERY     IR CT HEAD LTD  01/03/2022   IR PERCUTANEOUS ART THROMBECTOMY/INFUSION INTRACRANIAL INC DIAG ANGIO  01/03/2022   IR US GUIDE VASC ACCESS RIGHT  01/03/2022   LEFT HEART CATH AND CORS/GRAFTS ANGIOGRAPHY N/A 01/03/2022   Procedure: LEFT HEART CATH AND CORS/GRAFTS ANGIOGRAPHY;  Surgeon: Lennette Bihari, MD;  Location: [facility] INVASIVE CV LAB;  Service: Cardiovascular;  Laterality: N/A;   open heart surgery   2010   RADIOLOGY WITH ANESTHESIA N/A 01/03/2022   Procedure: IR WITH ANESTHESIA;  Surgeon: Radiologist, Medication, MD;  Location: [facility] OR;  Service: Radiology;  Laterality: N/A;   TUBAL LIGATION     Social History   Tobacco Use   Smoking status: Former    Current packs/day: 0.00    Average packs/day: 1 pack/day for 3.0 years (3.0 ttl pk-yrs)    Types: Cigarettes    Start date: 03/14/1991    Quit date: 05/22/1993    Years since quitting: 30.0   Smokeless tobacco: Never   Tobacco comments:     Year Quit: 1995  Vaping Use   Vaping status: Never Used  Substance Use Topics   Alcohol use: No    Alcohol/week: 0.0 standard drinks of alcohol   Drug use: No   Social History   Socioeconomic History   Marital status: Single    Spouse name: Not on file   Number of children: 1   Years  of education: 12   Highest education level: Not on file  Occupational History   Occupation: disabilied  Tobacco Use   Smoking status: Former    Current packs/day: 0.00    Average packs/day: 1 pack/day for 3.0 years (3.0 ttl pk-yrs)    Types: Cigarettes    Start date: 03/14/1991    Quit date: 05/22/1993    Years since quitting: 30.0   Smokeless tobacco: Never   Tobacco comments:     Year Quit: 1995  Vaping Use   Vaping status: Never Used  Substance and Sexual Activity   Alcohol use: No    Alcohol/week: 0.0 standard drinks of alcohol   Drug use: No   Sexual activity: Yes    Birth control/protection: Surgical, Post-menopausal    Comment: tubal  Other Topics Concern   Not on file  Social History Narrative   [facility] (03/19/17):   Originally from [place], [place]. Has always lived in [place].  Previously worked at [employer]. She is disabled due to her prior cardiac history. 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 No pets currently. No bird exposure. She reports she did have mold in a prior home that she lived in for 3 years. She has since moved to a new home, lives alone. She enjoys walking.    Social Drivers of Health   Financial Resource Strain: Medium Risk (12/14/2020)   Overall Financial Resource Strain (CARDIA)    Difficulty of Paying Living Expenses: Somewhat hard  Food Insecurity: No Food Insecurity (03/20/2023)   Hunger Vital Sign    Worried About Running Out of Food in the Last Year: Never true    Ran Out of Food in the Last Year: Never true  Transportation Needs: No Transportation Needs (03/20/2023)   PRAPARE - Administrator, Civil Service (Medical): No    Lack of Transportation (Non-Medical): No  Physical Activity: Insufficiently Active (12/14/2020)   Exercise Vital Sign    Days of Exercise per Week: 3 days    Minutes of Exercise per Session: 30 min  Stress: No Stress Concern Present (12/14/2020)    [employer] of Occupational Health - Occupational Stress Questionnaire    Feeling of Stress : Only a little  Social Connections: Unknown (09/30/2021)   [facility]   Social Network    Social Network: Not on file  Intimate Partner Violence: Not At Risk (03/20/2023)   Humiliation, Afraid, Rape, and Kick questionnaire    Fear of Current or Ex-Partner: No    Emotionally Abused: No    Physically Abused: No    Sexually Abused: No   Family Status  Relation Name Status   Mother  Alive   Father  Deceased   Sister 2 half Alive   Brother 2 half Alive   Son  English as a second language teacher  (Not Specified)   Neg Hx  (Not Specified)  No partnership data on file   Family History  Problem Relation Age of Onset   Diabetes Mother    Heart disease Father    Asthma Father    COPD Father    Asthma Son    Asthma Paternal Aunt    Colon cancer Neg Hx    Colon polyps Neg Hx    Esophageal cancer Neg Hx    Rectal cancer Neg Hx    Stomach cancer Neg Hx    Allergies  Allergen Reactions   Metformin Anaphylaxis   Penicillins Anaphylaxis and Other (See Comments)    Has patient had a PCN reaction causing immediate rash, facial/tongue/throat swelling, SOB or lightheadedness with hypotension: Yes Has patient had a PCN reaction causing severe rash involving mucus membranes or skin necrosis: No Has patient had a PCN reaction that required hospitalization No Has patient had a PCN reaction occurring within the last 10 years: No If all of the above answers are "NO", then may proceed with Cephalosporin use. Throat swelling   Metoclopramide Other (See Comments)    Reaction:  Nightmares       ROS Negative unless indicated in HPI   Objective:     LMP 09/22/2019  BP Readings from Last 3 Encounters:  04/27/23 (!) 119/45  03/20/23 109/60  03/15/23 110/78   Wt Readings from Last 3 Encounters:  04/27/23 223 lb (101.2 kg)  03/20/23 227 lb (103 kg)  03/15/23 226 lb 12.8 oz  (102.9 kg)  Physical Exam   No results found for any visits on 06/20/23.  Last CBC Lab Results  Component Value Date   WBC 9.2 03/20/2023   HGB 13.5 03/20/2023   HCT 42.8 03/20/2023   MCV 87 03/20/2023   MCH 27.6 03/20/2023   RDW 14.3 03/20/2023   PLT 293 03/20/2023   Last metabolic panel Lab Results  Component Value Date   GLUCOSE 162 (H) 03/20/2023   NA 140 03/20/2023   K 4.2 03/20/2023   CL 101 03/20/2023   CO2 24 03/20/2023   BUN 6 03/20/2023   CREATININE 0.59 03/20/2023   EGFR 106 03/20/2023   CALCIUM 10.2 03/20/2023   PHOS 3.0 01/05/2022   PROT 6.6 03/20/2023   ALBUMIN 4.1 03/20/2023   LABGLOB 2.5 03/20/2023   BILITOT 0.4 03/20/2023   ALKPHOS 197 (H) 03/20/2023   AST 18 03/20/2023   ALT 13 03/20/2023   ANIONGAP 8 02/05/2022   Last lipids Lab Results  Component Value Date   CHOL 163 03/19/2023   HDL 38 (L) 03/19/2023   LDLCALC 100 (H) 03/19/2023   TRIG 141 03/19/2023   CHOLHDL 4.3 03/19/2023   Last hemoglobin A1c Lab Results  Component Value Date   HGBA1C 7.9 (H) 03/20/2023   Last thyroid functions Lab Results  Component Value Date   TSH 0.484 03/20/2023   T3TOTAL 79.9 (L) 06/12/2012   T4TOTAL 12.3 (H) 03/20/2023        Assessment & Plan:  There are no diagnoses linked to this encounter. Continue healthy lifestyle choices, including diet (rich in fruits, vegetables, and lean proteins, and low in salt and simple carbohydrates) and exercise (at least 30 minutes of moderate physical activity daily).     The above assessment and management plan was discussed with the patient. The patient verbalized understanding of and has agreed to the management plan. Patient is aware to call the clinic if they develop any new symptoms or if symptoms persist or worsen. Patient is aware when to return to the clinic for a follow-up visit. Patient educated on when it is appropriate to go to the emergency department.  No follow-ups on file.    Arrie Aran  [place]ighter, [facility] [address] [place], [place] [address] [phone]    Note: This document was prepared by [employer] voice dictation technology and any errors that results from this process are unintentional.

## 2023-06-20 ENCOUNTER — Ambulatory Visit: Payer: Medicare Other | Admitting: Nurse Practitioner

## 2023-06-20 DIAGNOSIS — E782 Mixed hyperlipidemia: Secondary | ICD-10-CM

## 2023-06-20 DIAGNOSIS — K219 Gastro-esophageal reflux disease without esophagitis: Secondary | ICD-10-CM

## 2023-06-20 DIAGNOSIS — Z794 Long term (current) use of insulin: Secondary | ICD-10-CM

## 2023-06-20 DIAGNOSIS — I10 Essential (primary) hypertension: Secondary | ICD-10-CM

## 2023-06-20 DIAGNOSIS — E039 Hypothyroidism, unspecified: Secondary | ICD-10-CM

## 2023-06-21 ENCOUNTER — Encounter: Payer: Self-pay | Admitting: Nurse Practitioner

## 2023-09-12 ENCOUNTER — Telehealth: Payer: Self-pay | Admitting: *Deleted

## 2023-09-12 NOTE — Telephone Encounter (Signed)
 Received letter in [place] office from [facility] stating they are allowing pt a temporary supply of Repatha  d/t it not being on their formulary or not covered.  This is a letter sent to the pt with cc to ordering MD (Hochrein).  Also states at bottom of letter "This drug is on our formulary, but requires prior authorization."  Will forward to our pharmacy team for assistance with PA.

## 2023-09-13 ENCOUNTER — Other Ambulatory Visit (HOSPITAL_COMMUNITY): Payer: Self-pay

## 2023-09-13 ENCOUNTER — Telehealth: Payer: Self-pay | Admitting: Pharmacy Technician

## 2023-09-13 NOTE — Telephone Encounter (Signed)
 Pharmacy Patient Advocate Encounter   Received notification from Pt Calls Messages that prior authorization for REPATHA  is required/requested.   Insurance verification completed.   The patient is insured through [employer] .   Per test claim: PA required; PA submitted to above mentioned insurance via CoverMyMeds Key/confirmation #/EOC [id number] Status is pending

## 2023-09-15 ENCOUNTER — Other Ambulatory Visit (HOSPITAL_BASED_OUTPATIENT_CLINIC_OR_DEPARTMENT_OTHER): Payer: Self-pay | Admitting: Family

## 2023-09-15 DIAGNOSIS — R600 Localized edema: Secondary | ICD-10-CM

## 2023-09-17 ENCOUNTER — Other Ambulatory Visit (HOSPITAL_COMMUNITY): Payer: Self-pay

## 2023-09-17 NOTE — Telephone Encounter (Signed)
 New key under [id number]

## 2023-09-18 NOTE — Telephone Encounter (Signed)
 Pharmacy Patient Advocate Encounter  Received notification from [facility] that Prior Authorization for REPATHA  has been APPROVED from 06/23/23 to 09/16/24.   I called her pharmacy and they said she just got it on 09/04/23 so too soon but didn't give me a date   PA #/Case ID/Reference #: [id number]

## 2023-10-09 ENCOUNTER — Other Ambulatory Visit: Payer: Self-pay | Admitting: Nurse Practitioner

## 2023-10-09 DIAGNOSIS — E039 Hypothyroidism, unspecified: Secondary | ICD-10-CM

## 2023-10-10 ENCOUNTER — Other Ambulatory Visit (HOSPITAL_COMMUNITY): Payer: Self-pay | Admitting: Family Medicine

## 2023-10-10 DIAGNOSIS — M542 Cervicalgia: Secondary | ICD-10-CM

## 2023-11-14 ENCOUNTER — Other Ambulatory Visit: Payer: Self-pay | Admitting: Nurse Practitioner

## 2023-11-14 DIAGNOSIS — E039 Hypothyroidism, unspecified: Secondary | ICD-10-CM

## 2023-11-14 NOTE — Telephone Encounter (Signed)
 Appt scheduled for 11/15/2023

## 2023-11-14 NOTE — Telephone Encounter (Signed)
 Nena NTBS was to be seen in Jan for 3 mos FU 30 days given

## 2023-11-15 ENCOUNTER — Encounter: Payer: Self-pay | Admitting: Nurse Practitioner

## 2023-11-15 ENCOUNTER — Ambulatory Visit (INDEPENDENT_AMBULATORY_CARE_PROVIDER_SITE_OTHER): Admitting: Nurse Practitioner

## 2023-11-15 VITALS — BP 119/68 | HR 59 | Temp 98.1°F | Ht 64.0 in | Wt 236.8 lb

## 2023-11-15 DIAGNOSIS — Z794 Long term (current) use of insulin: Secondary | ICD-10-CM

## 2023-11-15 DIAGNOSIS — E119 Type 2 diabetes mellitus without complications: Secondary | ICD-10-CM

## 2023-11-15 NOTE — Progress Notes (Signed)
 Had an encounter with Kim  [name] March 16, 1966 to establish care, she has been no show for her f/u appointments Today she scheduled an appointment for med refill, when I entered the room she told me that she goes to [facility] her PCP is [name] and she needed to have her Synthroid  refill not sure why I have to see someone to get my medication refilled normally they just refill them without being seen.  When I called [facility] yesterday I was told that my PCP will be out for 2 weeks and 2 seconds later [name] called me and said that I have to come see you to get my thyroid  medication refill Explain to her that she can't have 2 PCP, she cursed at me and walked out of the room

## 2023-11-30 ENCOUNTER — Ambulatory Visit: Admitting: Adult Health

## 2023-12-08 ENCOUNTER — Other Ambulatory Visit (HOSPITAL_BASED_OUTPATIENT_CLINIC_OR_DEPARTMENT_OTHER): Payer: Self-pay | Admitting: Family

## 2023-12-08 ENCOUNTER — Other Ambulatory Visit: Payer: Self-pay | Admitting: Nurse Practitioner

## 2023-12-08 DIAGNOSIS — E039 Hypothyroidism, unspecified: Secondary | ICD-10-CM

## 2023-12-08 DIAGNOSIS — I25708 Atherosclerosis of coronary artery bypass graft(s), unspecified, with other forms of angina pectoris: Secondary | ICD-10-CM

## 2023-12-31 NOTE — Therapy (Signed)
 OUTPATIENT PHYSICAL THERAPY LOWER EXTREMITY EVALUATION   Patient Name: [name]  PETULA ROTOLO MRN: [id number] DOB:22-Mar-1966, 58 y.o., female Today's Date: 01/01/2024  END OF SESSION:  PT End of Session - 01/01/24 1301     Visit Number 1    Date for PT Re-Evaluation 02/26/24    Authorization Type MCR    Progress Note Due on Visit 10    PT Start Time 1302    PT Stop Time 1355    PT Time Calculation (min) 53 min    Activity Tolerance Patient tolerated treatment well    Behavior During Therapy [facility] for tasks assessed/performed          Past Medical History:  Diagnosis Date   Anxiety    Asthma    Blood transfusion without reported diagnosis    2010 CABG   CAD (coronary artery disease)    4v CABG, 9/10; NL LVF, status post followup Cardiolite November 2012 no ischemia ejection fraction 65%   Cataract    are forming   Diabetes mellitus    Dyslipidemia     LDL 22 mg percent on Crestor    Esophageal stricture    Fatty liver    Gastroparesis due to DM (HCC)    GERD (gastroesophageal reflux disease)    Glaucoma    Hyperlipidemia    Hypertension    Hypothyroidism    Knee injury, right, initial encounter    Left-sided chest wall pain    Myocardial infarction (HCC) 2010   2008 x 2 stents, 2009 x 2 stents. 01-2009 CABG x 4    Stroke [facility])    Past Surgical History:  Procedure Laterality Date   ANGIOPLASTY     with stenting 2008,2009   CARDIAC SURGERY     CESAREAN SECTION     CORONARY ARTERY BYPASS GRAFT  01/26/2009   2008-2 stents; 2009-2 stents   EYE SURGERY     IR CT HEAD LTD  01/03/2022   IR PERCUTANEOUS ART THROMBECTOMY/INFUSION INTRACRANIAL INC DIAG ANGIO  01/03/2022   IR US  GUIDE VASC ACCESS RIGHT  01/03/2022   LEFT HEART CATH AND CORS/GRAFTS ANGIOGRAPHY N/A 01/03/2022   Procedure: LEFT HEART CATH AND CORS/GRAFTS ANGIOGRAPHY;  Surgeon: Burnard Debby LABOR, MD;  Location: [facility] INVASIVE CV LAB;  Service: Cardiovascular;  Laterality: N/A;   open heart surgery   2010   RADIOLOGY  WITH ANESTHESIA N/A 01/03/2022   Procedure: IR WITH ANESTHESIA;  Surgeon: Radiologist, Medication, MD;  Location: [facility] OR;  Service: Radiology;  Laterality: N/A;   TUBAL LIGATION     Patient Active Problem List   Diagnosis Date Noted   Diabetes mellitus due to underlying condition with complication, with long-term current use of insulin  (HCC) 03/20/2023   Acute ischemic left MCA stroke (HCC) 01/03/2022   NSTEMI (non-ST elevated myocardial infarction) (HCC) 12/31/2021   Derangement of right knee 11/27/2021   Screening mammogram for breast cancer 12/14/2020   Encounter for screening fecal occult blood testing 12/14/2020   Post-menopause 12/14/2020   Encounter for well woman exam with routine gynecological exam 12/14/2020   Acute respiratory failure with hypoxia (HCC) 04/17/2019   Uncontrolled type 1 diabetes mellitus with hypoglycemia without coma, with long-term current use of insulin  (HCC) 04/17/2019   COVID-19 virus infection    Pneumonia due to COVID-19 virus 04/16/2019   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 (HCC) 03/01/2019   Acute bronchitis 07/18/2017   Oral thrush 04/19/2017   Chronic seasonal allergic rhinitis 03/19/2017   Diabetic retinopathy of both  eyes without macular edema associated with type 2 diabetes mellitus (HCC) 08/30/2016   Mild persistent asthma without complication 07/06/2016   Diabetes mellitus with complication (HCC)    Atypical chest pain    Muscle spasm 08/20/2014   Plantar fasciitis of right foot 03/04/2014   Obesity 08/03/2013   Hx of CABG    Ejection fraction    HTN (hypertension) 08/22/2012   Adjustment disorder with mixed anxiety and depressed mood 03/05/2012   Insulin  pump in place 02/06/2012   Hypothyroidism 09/29/2011   Edema 08/25/2011   Gastroparesis 09/07/2010   Vitamin D deficiency 08/10/2010   Hyperlipidemia 02/21/2010   Muscle spasm of left shoulder 04/29/2009   PALPITATIONS 04/12/2009   WHEEZING 04/12/2009    GERD 01/19/2009   Chest pain 02/04/2008   Diabetes mellitus type 2, insulin  dependent (HCC) 06/12/2007   Anxiety disorder 06/12/2007   Coronary artery disease involving coronary bypass graft of native heart 06/12/2007   Asthma in adult 06/12/2007    PCP: No primary care provider on file.   REFERRING PROVIDER: Arnaldo Juliene RAMAN, MD   REFERRING DIAG: [phone] (ICD-10-CM) - Pain in left ankle and joints of left foot   THERAPY DIAG:  Pain in right ankle and joints of right foot  Stiffness of right ankle, not elsewhere classified  Cramp and spasm  Rationale for Evaluation and Treatment: Rehabilitation  ONSET DATE: 1 year to 1.5 year  SUBJECTIVE:   SUBJECTIVE STATEMENT: . It comes and goes. It will do good for a while and then it will come back. 3 weeks ago I sprained my ankle when walking in the yard. Gets swelling if on it more than an hour. Standing limited to 30 min.   PERTINENT HISTORY: CAD, CVA during last hear procedure with no residual effects, DM with insulin  pump,  PAIN:  Are you having pain? Yes: NPRS scale: 7-8  Pain location: R medial ankle Pain description: throbbing sometimes sharp Aggravating factors: standing to wash dishes and mopping floors, walking Relieving factors: rest, NWB  PRECAUTIONS: None  RED FLAGS: None   WEIGHT BEARING RESTRICTIONS: No  FALLS:  Has patient fallen in last 6 months? No  LIVING ENVIRONMENT: Lives with: lives alone Lives in: House/apartment Stairs: Yes: External: 19 steps; can reach both Has following equipment at home: None  Uses step to gait on stairs  OCCUPATION: on disability due to heart disease  PLOF: Independent  PATIENT GOALS: decrease pain, stand longer than 30 min and make chores easier  NEXT MD VISIT: none scheduled yet  OBJECTIVE:  Note: Objective measures were completed at Evaluation unless otherwise noted.  DIAGNOSTIC FINDINGS: xrays - can't find results  PATIENT SURVEYS:  LEFS 34 / 80 = 42.5  %  COGNITION: Overall cognitive status: Within functional limits for tasks assessed     SENSATION: WFL  EDEMA:  Visible edema compared to L distal to medial malleolus  MUSCLE LENGTH: Tight gastrocs B  POSTURE: B pes planus, increased pronation of R, hip in ER    PALPATION: Marked TTP along medial tibial border, ext digitorum brevis, over navicular and with navicular mobs  LOWER EXTREMITY ROM:  A/P ROM Right eval Left eval  Ankle dorsiflexion -2/2 3/4  Ankle plantarflexion 65 64  Ankle inversion 16/24 35  Ankle eversion 28/28 13/36   (Blank rows = not tested)  LOWER EXTREMITY MMT: R eversion releases due to pain, else 5/5. Pain on R with eversion and plantar flexion   GAIT: WNL; Stairs require step to gait secondary  to pain                                                                                                                               TREATMENT DATE:   01/01/24   See pt ed and HEP  Ankle isometrics Seated heel raises Premod estim 80-150 Hz x 10 min to R ankle  PATIENT EDUCATION:  Education details: PT eval findings, anticipated POC, initial HEP, and discussed ice application   Person educated: Patient Education method: Explanation, Demonstration, Tactile cues, Verbal cues, and Handouts Education comprehension: verbalized understanding and returned demonstration  HOME EXERCISE PROGRAM: Access Code: [email or web address] URL: [email or web address] Date: 01/01/2024 Prepared by: Mliss  Exercises - Isometric Ankle Inversion  - 2 x daily - 7 x weekly - 1 sets - 10 reps - 5 sec hold - Isometric Ankle Eversion at Wall  - 2 x daily - 7 x weekly - 1 sets - 10 reps - 5 hold - Long Sitting Isometric Ankle Plantarflexion with Ball at [employer]  - 2 x daily - 7 x weekly - 1 sets - 10 reps - 5 hold - Seated Heel Raise  - 2 x daily - 7 x weekly - 2 sets - 10 reps  ASSESSMENT:  CLINICAL IMPRESSION: Patient is a 58 y.o. female who was seen today for physical  therapy evaluation and treatment for left ankle pain after twisting her ankle 3 weeks ago walking in the yard. She has had medial ankle pain for 1-1.5 years that she states is intermittent. She now has pain with weightbearing affecting standing and walking tolerances and ADLs. She will benefit from skilled PT to address these deficits and those listed below. .   OBJECTIVE IMPAIRMENTS: decreased activity tolerance, difficulty walking, decreased ROM, decreased strength, increased edema, increased muscle spasms, impaired flexibility, postural dysfunction, and pain.   ACTIVITY LIMITATIONS: standing, squatting, stairs, dressing, and locomotion level  PARTICIPATION LIMITATIONS: meal prep, cleaning, laundry, driving, shopping, and community activity  PERSONAL FACTORS: Fitness, Time since onset of injury/illness/exacerbation, and 1-2 comorbidities: DM, heart disease are also affecting patient's functional outcome.   REHAB POTENTIAL: Good  CLINICAL DECISION MAKING: Evolving/moderate complexity  EVALUATION COMPLEXITY: Moderate   GOALS: Goals reviewed with patient? Yes  SHORT TERM GOALS: Target date: 01/29/2024 Patient will be independent with initial HEP. Baseline:  Goal status: INITIAL  2.  Decreased pain with weightbearing by 30% to increase standing and walking tolerances. Baseline:  Goal status: INITIAL    LONG TERM GOALS: Target date: 02/26/2024  Patient will be independent with advanced/ongoing HEP to improve outcomes and carryover.  Baseline:  Goal status: INITIAL  2.  Patient will report at least 75% improvement in R ankle/foot pain with standing, walking and household chores.  Baseline:  Goal status: INITIAL  3.  Patient will demonstrate improved R ankle AROM by 5-10 deg in DF, Inv and Eversion to allow for normal gait and stair mechanics. Baseline: see objective Goal status: INITIAL  4. Patient will be able to ascend/descend stairs with 1 HR and reciprocal step pattern safely  to access home and community.  Baseline:  Goal status: INITIAL  5.  Improved LEFS by >= 9 points showing functional improvement Baseline: 34 / 80  Goal status: INITIAL     PLAN:  PT FREQUENCY: 2x/week  PT DURATION: 8 weeks  PLANNED INTERVENTIONS: 97164- PT Re-evaluation, 97110-Therapeutic exercises, 97530- Therapeutic activity, 97112- Neuromuscular re-education, 97535- Self Care, 02859- Manual therapy, [phone]- Gait training, [phone]- Aquatic Therapy, [phone]- Electrical stimulation (unattended), [phone]- Ionotophoresis 4mg /ml Dexamethasone , 79439 (1-2 muscles), 20561 (3+ muscles)- Dry Needling, Patient/Family education, Balance training, Stair training, Taping, Joint mobilization, Cryotherapy, and Moist heat  PLAN FOR NEXT SESSION: Review and progress HEP, gastroc/soleus stretching, ankle ROM, , manual/DN to flexor digitorum and posterior tibialis    Herley Bernardini, PT 01/01/2024, 2:21 PM

## 2024-01-01 ENCOUNTER — Ambulatory Visit: Attending: Sports Medicine | Admitting: Physical Therapy

## 2024-01-01 ENCOUNTER — Encounter: Payer: Self-pay | Admitting: Physical Therapy

## 2024-01-01 ENCOUNTER — Other Ambulatory Visit: Payer: Self-pay

## 2024-01-01 DIAGNOSIS — R252 Cramp and spasm: Secondary | ICD-10-CM | POA: Diagnosis present

## 2024-01-01 DIAGNOSIS — M25671 Stiffness of right ankle, not elsewhere classified: Secondary | ICD-10-CM | POA: Diagnosis present

## 2024-01-01 DIAGNOSIS — M25571 Pain in right ankle and joints of right foot: Secondary | ICD-10-CM | POA: Diagnosis present

## 2024-01-07 ENCOUNTER — Encounter: Payer: Self-pay | Admitting: *Deleted

## 2024-01-07 ENCOUNTER — Ambulatory Visit: Admitting: *Deleted

## 2024-01-07 DIAGNOSIS — M25571 Pain in right ankle and joints of right foot: Secondary | ICD-10-CM

## 2024-01-07 DIAGNOSIS — M25671 Stiffness of right ankle, not elsewhere classified: Secondary | ICD-10-CM

## 2024-01-07 NOTE — Therapy (Signed)
 OUTPATIENT PHYSICAL THERAPY LOWER EXTREMITY TREATMENT   Patient Name: [name] MRN: [id number] DOB:08-18-1965, 58 y.o., female Today's Date: 01/07/2024  END OF SESSION:  PT End of Session - 01/07/24 1308     Visit Number 2    Date for PT Re-Evaluation 02/26/24    Authorization Type MCR    Progress Note Due on Visit 10    PT Start Time 1301    PT Stop Time 1400    PT Time Calculation (min) 59 min          Past Medical History:  Diagnosis Date   Anxiety    Asthma    Blood transfusion without reported diagnosis    2010 CABG   CAD (coronary artery disease)    4v CABG, 9/10; NL LVF, status post followup Cardiolite November 2012 no ischemia ejection fraction 65%   Cataract    are forming   Diabetes mellitus    Dyslipidemia     LDL 22 mg percent on Crestor    Esophageal stricture    Fatty liver    Gastroparesis due to DM (HCC)    GERD (gastroesophageal reflux disease)    Glaucoma    Hyperlipidemia    Hypertension    Hypothyroidism    Knee injury, right, initial encounter    Left-sided chest wall pain    Myocardial infarction (HCC) 2010   2008 x 2 stents, 2009 x 2 stents. 01-2009 CABG x 4    Stroke [facility])    Past Surgical History:  Procedure Laterality Date   ANGIOPLASTY     with stenting 2008,2009   CARDIAC SURGERY     CESAREAN SECTION     CORONARY ARTERY BYPASS GRAFT  01/26/2009   2008-2 stents; 2009-2 stents   EYE SURGERY     IR CT HEAD LTD  01/03/2022   IR PERCUTANEOUS ART THROMBECTOMY/INFUSION INTRACRANIAL INC DIAG ANGIO  01/03/2022   IR US  GUIDE VASC ACCESS RIGHT  01/03/2022   LEFT HEART CATH AND CORS/GRAFTS ANGIOGRAPHY N/A 01/03/2022   Procedure: LEFT HEART CATH AND CORS/GRAFTS ANGIOGRAPHY;  Surgeon: Burnard Debby LABOR, MD;  Location: [facility] INVASIVE CV LAB;  Service: Cardiovascular;  Laterality: N/A;   open heart surgery   2010   RADIOLOGY WITH ANESTHESIA N/A 01/03/2022   Procedure: IR WITH ANESTHESIA;  Surgeon: Radiologist, Medication, MD;  Location: [facility] OR;   Service: Radiology;  Laterality: N/A;   TUBAL LIGATION     Patient Active Problem List   Diagnosis Date Noted   Diabetes mellitus due to underlying condition with complication, with long-term current use of insulin  (HCC) 03/20/2023   Acute ischemic left MCA stroke (HCC) 01/03/2022   NSTEMI (non-ST elevated myocardial infarction) (HCC) 12/31/2021   Derangement of right knee 11/27/2021   Screening mammogram for breast cancer 12/14/2020   Encounter for screening fecal occult blood testing 12/14/2020   Post-menopause 12/14/2020   Encounter for well woman exam with routine gynecological exam 12/14/2020   Acute respiratory failure with hypoxia (HCC) 04/17/2019   Uncontrolled type 1 diabetes mellitus with hypoglycemia without coma, with long-term current use of insulin  (HCC) 04/17/2019   COVID-19 virus infection    Pneumonia due to COVID-19 virus 04/16/2019   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 (HCC) 03/01/2019   Acute bronchitis 07/18/2017   Oral thrush 04/19/2017   Chronic seasonal allergic rhinitis 03/19/2017   Diabetic retinopathy of both eyes without macular edema associated with type 2 diabetes mellitus (HCC) 08/30/2016   Mild persistent asthma without complication  07/06/2016   Diabetes mellitus with complication (HCC)    Atypical chest pain    Muscle spasm 08/20/2014   Plantar fasciitis of right foot 03/04/2014   Obesity 08/03/2013   Hx of CABG    Ejection fraction    HTN (hypertension) 08/22/2012   Adjustment disorder with mixed anxiety and depressed mood 03/05/2012   Insulin  pump in place 02/06/2012   Hypothyroidism 09/29/2011   Edema 08/25/2011   Gastroparesis 09/07/2010   Vitamin D deficiency 08/10/2010   Hyperlipidemia 02/21/2010   Muscle spasm of left shoulder 04/29/2009   PALPITATIONS 04/12/2009   WHEEZING 04/12/2009   GERD 01/19/2009   Chest pain 02/04/2008   Diabetes mellitus type 2, insulin  dependent (HCC) 06/12/2007   Anxiety  disorder 06/12/2007   Coronary artery disease involving coronary bypass graft of native heart 06/12/2007   Asthma in adult 06/12/2007    PCP: No primary care provider on file.   REFERRING PROVIDER: Arnaldo Juliene RAMAN, MD   REFERRING DIAG: [phone] (ICD-10-CM) - Pain in left ankle and joints of left foot   THERAPY DIAG:  Pain in right ankle and joints of right foot  Stiffness of right ankle, not elsewhere classified  Rationale for Evaluation and Treatment: Rehabilitation  ONSET DATE: 1 year to 1.5 year  SUBJECTIVE:   SUBJECTIVE STATEMENT: .RT ankle pain/ swelling comes and goes.    PERTINENT HISTORY: CAD, CVA during last hear procedure with no residual effects, DM with insulin  pump,  PAIN:  Are you having pain? Yes: NPRS scale: 5/10 Pain location: R medial ankle Pain description: throbbing sometimes sharp Aggravating factors: standing to wash dishes and mopping floors, walking Relieving factors: rest, NWB  PRECAUTIONS: None  RED FLAGS: None   WEIGHT BEARING RESTRICTIONS: No  FALLS:  Has patient fallen in last 6 months? No  LIVING ENVIRONMENT: Lives with: lives alone Lives in: House/apartment Stairs: Yes: External: 19 steps; can reach both Has following equipment at home: None  Uses step to gait on stairs  OCCUPATION: on disability due to heart disease  PLOF: Independent  PATIENT GOALS: decrease pain, stand longer than 30 min and make chores easier  NEXT MD VISIT: none scheduled yet  OBJECTIVE:  Note: Objective measures were completed at Evaluation unless otherwise noted.  DIAGNOSTIC FINDINGS: xrays - can't find results  PATIENT SURVEYS:  LEFS 34 / 80 = 42.5 %  COGNITION: Overall cognitive status: Within functional limits for tasks assessed     SENSATION: WFL  EDEMA:  Visible edema compared to L distal to medial malleolus  MUSCLE LENGTH: Tight gastrocs B  POSTURE: B pes planus, increased pronation of R, hip in ER    PALPATION: Marked TTP  along medial tibial border, ext digitorum brevis, over navicular and with navicular mobs  LOWER EXTREMITY ROM:  A/P ROM Right eval Left eval  Ankle dorsiflexion -2/2 3/4  Ankle plantarflexion 65 64  Ankle inversion 16/24 35  Ankle eversion 28/28 13/36   (Blank rows = not tested)  LOWER EXTREMITY MMT: R eversion releases due to pain, else 5/5. Pain on R with eversion and plantar flexion   GAIT: WNL; Stairs require step to gait secondary to pain  TREATMENT DATE:   01/07/24   Reviewed HEP  Ankle isometrics Seated heel raises                                    EXERCISE LOG    RT ankle  Exercise Repetitions and Resistance Comments  Nustep L1 x 10.5 mins   Rocker board PF/DF x 4 mins   Dyna disc CW/CCW  x 4 mins   INV/ EV   Yellow Tband   3x10 each        Blank cell = exercise not performed today  Added Tband INV/ Eversion to HEP IFC estim 80-150 Hz x 15 min to R ankle  PATIENT EDUCATION:  Education details: PT eval findings, anticipated POC, initial HEP, and discussed ice application   Person educated: Patient Education method: Explanation, Demonstration, Tactile cues, Verbal cues, and Handouts Education comprehension: verbalized understanding and returned demonstration  HOME EXERCISE PROGRAM: Access Code: [email or web address] URL: [email or web address] Date: 01/01/2024 Prepared by: Mliss  Exercises - Isometric Ankle Inversion  - 2 x daily - 7 x weekly - 1 sets - 10 reps - 5 sec hold - Isometric Ankle Eversion at Wall  - 2 x daily - 7 x weekly - 1 sets - 10 reps - 5 hold - Long Sitting Isometric Ankle Plantarflexion with Ball at [employer]  - 2 x daily - 7 x weekly - 1 sets - 10 reps - 5 hold - Seated Heel Raise  - 2 x daily - 7 x weekly - 2 sets - 10 reps  ASSESSMENT:  CLINICAL IMPRESSION: Patient arrived today doing fairly well with RT  ankle.She was able to perform HEP as well as [facility] and CKC exs for ROM and light strengthing and did well. IFC end of session . Discussed using ice each day.     OBJECTIVE IMPAIRMENTS: decreased activity tolerance, difficulty walking, decreased ROM, decreased strength, increased edema, increased muscle spasms, impaired flexibility, postural dysfunction, and pain.   ACTIVITY LIMITATIONS: standing, squatting, stairs, dressing, and locomotion level  PARTICIPATION LIMITATIONS: meal prep, cleaning, laundry, driving, shopping, and community activity  PERSONAL FACTORS: Fitness, Time since onset of injury/illness/exacerbation, and 1-2 comorbidities: DM, heart disease are also affecting patient's functional outcome.   REHAB POTENTIAL: Good  CLINICAL DECISION MAKING: Evolving/moderate complexity  EVALUATION COMPLEXITY: Moderate   GOALS: Goals reviewed with patient? Yes  SHORT TERM GOALS: Target date: 01/29/2024 Patient will be independent with initial HEP. Baseline:  Goal status: INITIAL  2.  Decreased pain with weightbearing by 30% to increase standing and walking tolerances. Baseline:  Goal status: INITIAL    LONG TERM GOALS: Target date: 02/26/2024  Patient will be independent with advanced/ongoing HEP to improve outcomes and carryover.  Baseline:  Goal status: INITIAL  2.  Patient will report at least 75% improvement in R ankle/foot pain with standing, walking and household chores.  Baseline:  Goal status: INITIAL  3.  Patient will demonstrate improved R ankle AROM by 5-10 deg in DF, Inv and Eversion to allow for normal gait and stair mechanics. Baseline: see objective Goal status: INITIAL  4. Patient will be able to ascend/descend stairs with 1 HR and reciprocal step pattern safely to access home and community.  Baseline:  Goal status: INITIAL  5.  Improved LEFS by >= 9 points showing functional improvement Baseline: 34 / 80  Goal status: INITIAL     PLAN:  PT  FREQUENCY: 2x/week  PT DURATION: 8 weeks  PLANNED INTERVENTIONS: 97164- PT Re-evaluation, 97110-Therapeutic exercises, 97530- Therapeutic activity, 97112- Neuromuscular re-education, 97535- Self Care, 02859- Manual therapy, [phone]- Gait training, [phone]- Aquatic Therapy, [phone]- Electrical stimulation (unattended), [phone]- Ionotophoresis 4mg /ml Dexamethasone , 79439 (1-2 muscles), 20561 (3+ muscles)- Dry Needling, Patient/Family education, Balance training, Stair training, Taping, Joint mobilization, Cryotherapy, and Moist heat  PLAN FOR NEXT SESSION: Review and progress HEP, gastroc/soleus stretching, ankle ROM, , manual/DN to flexor digitorum and posterior tibialis    Josi Roediger,CHRIS, PTA 01/07/2024, 3:39 PM

## 2024-01-14 ENCOUNTER — Encounter (HOSPITAL_COMMUNITY): Payer: Self-pay | Admitting: Emergency Medicine

## 2024-01-14 ENCOUNTER — Emergency Department (HOSPITAL_COMMUNITY)
Admission: EM | Admit: 2024-01-14 | Discharge: 2024-01-14 | Disposition: A | Discharge status: Home / Self Care | Attending: Emergency Medicine | Admitting: Emergency Medicine

## 2024-01-14 ENCOUNTER — Other Ambulatory Visit: Payer: Self-pay

## 2024-01-14 ENCOUNTER — Encounter: Admitting: *Deleted

## 2024-01-14 ENCOUNTER — Emergency Department (HOSPITAL_COMMUNITY)

## 2024-01-14 DIAGNOSIS — Z8673 Personal history of transient ischemic attack (TIA), and cerebral infarction without residual deficits: Secondary | ICD-10-CM | POA: Diagnosis not present

## 2024-01-14 DIAGNOSIS — E119 Type 2 diabetes mellitus without complications: Secondary | ICD-10-CM | POA: Insufficient documentation

## 2024-01-14 DIAGNOSIS — R519 Headache, unspecified: Secondary | ICD-10-CM | POA: Insufficient documentation

## 2024-01-14 DIAGNOSIS — E039 Hypothyroidism, unspecified: Secondary | ICD-10-CM | POA: Insufficient documentation

## 2024-01-14 DIAGNOSIS — Z951 Presence of aortocoronary bypass graft: Secondary | ICD-10-CM | POA: Insufficient documentation

## 2024-01-14 DIAGNOSIS — I10 Essential (primary) hypertension: Secondary | ICD-10-CM | POA: Diagnosis not present

## 2024-01-14 DIAGNOSIS — I2510 Atherosclerotic heart disease of native coronary artery without angina pectoris: Secondary | ICD-10-CM | POA: Insufficient documentation

## 2024-01-14 DIAGNOSIS — J45909 Unspecified asthma, uncomplicated: Secondary | ICD-10-CM | POA: Diagnosis not present

## 2024-01-14 DIAGNOSIS — Z7982 Long term (current) use of aspirin: Secondary | ICD-10-CM | POA: Insufficient documentation

## 2024-01-14 LAB — CBG MONITORING, ED
Glucose-Capillary: 152 mg/dL — ABNORMAL HIGH (ref 70–99)
Glucose-Capillary: 157 mg/dL — ABNORMAL HIGH (ref 70–99)

## 2024-01-14 MED ORDER — DIPHENHYDRAMINE HCL 50 MG/ML IJ SOLN
12.5000 mg | Freq: Once | INTRAMUSCULAR | Status: AC
  Administered 2024-01-14: 12.5 mg via INTRAVENOUS
  Filled 2024-01-14: qty 1

## 2024-01-14 MED ORDER — ACETAMINOPHEN 500 MG PO TABS
1000.0000 mg | ORAL_TABLET | Freq: Once | ORAL | Status: AC
  Administered 2024-01-14: 1000 mg via ORAL
  Filled 2024-01-14: qty 2

## 2024-01-14 MED ORDER — ONDANSETRON HCL 4 MG/2ML IJ SOLN
4.0000 mg | Freq: Once | INTRAMUSCULAR | Status: AC
  Administered 2024-01-14: 4 mg via INTRAVENOUS
  Filled 2024-01-14: qty 2

## 2024-01-14 MED ORDER — KETOROLAC TROMETHAMINE 15 MG/ML IJ SOLN
15.0000 mg | Freq: Once | INTRAMUSCULAR | Status: AC
  Administered 2024-01-14: 15 mg via INTRAVENOUS
  Filled 2024-01-14: qty 1

## 2024-01-14 MED ORDER — PROCHLORPERAZINE EDISYLATE 10 MG/2ML IJ SOLN
10.0000 mg | Freq: Once | INTRAMUSCULAR | Status: AC
  Administered 2024-01-14: 10 mg via INTRAVENOUS
  Filled 2024-01-14: qty 2

## 2024-01-14 MED ORDER — SODIUM CHLORIDE 0.9 % IV BOLUS
1000.0000 mL | Freq: Once | INTRAVENOUS | Status: AC
  Administered 2024-01-14: 1000 mL via INTRAVENOUS

## 2024-01-14 NOTE — Discharge Instructions (Addendum)
 You were evaluated in the emergency room for headache.  Your CT scan did not show any new acute abnormality.  Please follow-up with your neurologist for further evaluation.  If you experience any new or worsening symptoms please return emergency room such as difficulty speaking, walking or weakness.

## 2024-01-14 NOTE — ED Notes (Signed)
 Pt completely assessed by EDP and set to discharge. No RN assessment performed.   Pt teaching provided on medications that may cause drowsiness. Pt instructed not to drive or operate heavy machinery while taking the prescribed medication. Pt verbalized understanding.   Pt provided discharge instructions and prescription information. Pt was given the opportunity to ask questions and questions were answered.

## 2024-01-14 NOTE — ED Notes (Signed)
 Patient wanted to disconnect her insulin  pump states she normally does , patient is alert oriented  husband at bedside.

## 2024-01-14 NOTE — ED Triage Notes (Signed)
 Complains of intermittent 10/10 sharp pains to right posterior head since Saturday.  Tylenol  has not helped. Pt also endorses an earache in right ear on Saturday.

## 2024-01-14 NOTE — ED Provider Notes (Signed)
 [place] EMERGENCY DEPARTMENT AT [facility] Provider Note   CSN: [id number] Arrival date & time: 01/14/24  9340     Patient presents with: No chief complaint on file.   Kim  KAMEELAH [name] is a 58 y.o. female history of hypertension, dyslipidemia, CAD, prior CVA on aspirin  and Plavix  presents with complaints of headache.  Headache came on Saturday gradually since worsened.  Comes on intermittently.  Described as sharp pain not associated with any vision changes, extremity weakness, numbness, difficulty ambulating or speaking.  Is accompanied by her fianc reports that she has been behaving at baseline.  Took Tylenol  with some temporary relief.   HPI    Past Medical History:  Diagnosis Date   Anxiety    Asthma    Blood transfusion without reported diagnosis    2010 CABG   CAD (coronary artery disease)    4v CABG, 9/10; NL LVF, status post followup Cardiolite November 2012 no ischemia ejection fraction 65%   Cataract    are forming   Diabetes mellitus    Dyslipidemia     LDL 22 mg percent on Crestor    Esophageal stricture    Fatty liver    Gastroparesis due to DM (HCC)    GERD (gastroesophageal reflux disease)    Glaucoma    Hyperlipidemia    Hypertension    Hypothyroidism    Knee injury, right, initial encounter    Left-sided chest wall pain    Myocardial infarction (HCC) 2010   2008 x 2 stents, 2009 x 2 stents. 01-2009 CABG x 4    Stroke [facility])    Past Surgical History:  Procedure Laterality Date   ANGIOPLASTY     with stenting 2008,2009   CARDIAC SURGERY     CESAREAN SECTION     CORONARY ARTERY BYPASS GRAFT  01/26/2009   2008-2 stents; 2009-2 stents   EYE SURGERY     IR CT HEAD LTD  01/03/2022   IR PERCUTANEOUS ART THROMBECTOMY/INFUSION INTRACRANIAL INC DIAG ANGIO  01/03/2022   IR US  GUIDE VASC ACCESS RIGHT  01/03/2022   LEFT HEART CATH AND CORS/GRAFTS ANGIOGRAPHY N/A 01/03/2022   Procedure: LEFT HEART CATH AND CORS/GRAFTS ANGIOGRAPHY;  Surgeon: Burnard Debby LABOR, MD;  Location: [facility] INVASIVE CV LAB;  Service: Cardiovascular;  Laterality: N/A;   open heart surgery   2010   RADIOLOGY WITH ANESTHESIA N/A 01/03/2022   Procedure: IR WITH ANESTHESIA;  Surgeon: Radiologist, Medication, MD;  Location: [facility] OR;  Service: Radiology;  Laterality: N/A;   TUBAL LIGATION       Prior to Admission medications   Medication Sig Start Date End Date Taking? Authorizing Provider  albuterol  (VENTOLIN  HFA) 108 (90 Base) MCG/ACT inhaler Inhale 2 puffs into the lungs every 6 (six) hours as needed for wheezing or shortness of breath. 04/21/19   Drusilla Sabas RAMAN, MD  ALPRAZolam  (XANAX ) 0.5 MG tablet Take 0.25 mg by mouth 2 (two) times daily as needed. 02/10/22   [provider]  aspirin  81 MG chewable tablet Chew 1 tablet (81 mg total) by mouth daily. 01/05/22   Graham Krabbe, MD  Budeson-Glycopyrrol-Formoterol  (BREZTRI  AEROSPHERE) 160-9-4.8 MCG/ACT AERO Inhale 2 puffs into the lungs in the morning and at bedtime. 10/10/22   Sood, Vineet, MD  clopidogrel  (PLAVIX ) 75 MG tablet TAKE 1 TABLET BY MOUTH EVERY DAY 03/19/23   Lavona Agent, MD  Evolocumab  (REPATHA  SURECLICK) 140 MG/ML SOAJ INJECT 140 MG INTO THE SKIN EVERY 14 (FOURTEEN) DAYS. 05/17/23   Lavona Agent, MD  ezetimibe  (ZETIA ) 10 MG tablet Take 1 tablet (10 mg total) by mouth daily. 03/22/23   Lavona Agent, MD  fluticasone  (FLONASE ) 50 MCG/ACT nasal spray Place 1 spray into both nostrils daily for 7 days. 02/05/22 01/01/24  Elnor Jayson LABOR, DO  furosemide  (LASIX ) 40 MG tablet Take 1 tablet (40 mg total) by mouth daily as needed. Please call [phone] to schedule an October appointment for future refills. Thank you. 09/17/23   Lavona Agent, MD  HUMALOG 100 UNIT/ML injection SMARTSIG:0-300 Unit(s) SUB-Q Daily    [provider]  lansoprazole  (PREVACID  SOLUTAB) 30 MG disintegrating tablet Take 1 tablet (30 mg total) by mouth daily. 10/26/22   Kerman Vina HERO, NP  levothyroxine  (SYNTHROID ) 137 MCG  tablet TAKE 1 TABLET (137 MCG TOTAL) DAILY BEFORE BREAKFAST. **NEEDS TO BE SEEN BEFORE NEXT REFILL** 11/14/23   [place] Sebastian Pool, NP  metoprolol  tartrate (LOPRESSOR ) 25 MG tablet TAKE 1.5 TABLETS (37.5 MG TOTAL) BY MOUTH TWICE A DAY 12/11/23   Lavona Agent, MD  nitroGLYCERIN  (NITROSTAT ) 0.4 MG SL tablet 1 TAB UNDER TONGUE EVERY 5 MINUTES X 3 DOSES AS NEED CHESTPAIN IF NO RELIEF AFTER 2ND DOSE,CALL 911 04/26/23   Lavona Agent, MD  oxyCODONE -acetaminophen  (PERCOCET/ROXICET) 5-325 MG tablet Take 1 tablet by mouth 3 (three) times daily as needed.    [provider]  Semaglutide,0.25 or 0.5MG /DOS, 2 MG/3ML SOPN Inject 0.25 mg into the skin once a week. 10/13/22   [provider]  TRESIBA FLEXTOUCH 100 UNIT/ML FlexTouch Pen Inject into the skin daily. 01/27/23   [provider]  triamcinolone ointment (KENALOG) 0.1 % Apply 1 application topically daily as needed. 11/02/20   [provider]    Allergies: Metformin, Penicillins, and Metoclopramide     Review of Systems  Updated Vital Signs BP 128/61   Pulse 67   Temp 97.8 F (36.6 C) (Oral)   Resp 16   Ht 5' 4 (1.626 m)   Wt 106.6 kg   LMP 09/22/2019   SpO2 100%   BMI 40.34 kg/m   Physical Exam  (all labs ordered are listed, but only abnormal results are displayed) Labs Reviewed  CBG MONITORING, ED - Abnormal; Notable for the following components:      Result Value   Glucose-Capillary 152 (*)    All other components within normal limits  CBG MONITORING, ED - Abnormal; Notable for the following components:   Glucose-Capillary 157 (*)    All other components within normal limits    EKG: None  Radiology: No results found.   Procedures   Medications Ordered in the ED  sodium chloride  0.9 % bolus 1,000 mL (0 mLs Intravenous Stopped 01/14/24 1055)  diphenhydrAMINE  (BENADRYL ) injection 12.5 mg (12.5 mg Intravenous Given 01/14/24 0920)  ondansetron  (ZOFRAN ) injection 4 mg (4 mg Intravenous  Given 01/14/24 0920)  acetaminophen  (TYLENOL ) tablet 1,000 mg (1,000 mg Oral Given 01/14/24 0911)  ketorolac  (TORADOL ) 15 MG/ML injection 15 mg (15 mg Intravenous Given 01/14/24 1252)  prochlorperazine  (COMPAZINE ) injection 10 mg (10 mg Intravenous Given 01/14/24 1252)    Clinical Course as of 01/23/24 1537  Mon Jan 14, 2024  0816 Patient with history of CAD status post PCI and CVA evaluated for intermittent headache since Saturday localized to right occiput not associated with any concerning neurological symptoms.  Headache was not sudden or maximal in onset, is not described as worst of life time.  She is hemodynamically stable.  Her neuroexam is entirely benign.  Considered advanced imaging given her significant  history.  At this time patient is interested in starting with a migraine cocktail.  I believe this is reasonable given her presentation and exam.  Will minister migraine cocktail and reevaluate. [JT]  1014 Reevaluation, patient with persistent symptoms.  Remains without neurodeficits.  Will obtain CT head without contrast. [JT]  1220 CT Head Wo Contrast No acute intracranial abnormality.  Subtle chronic encephalomalacia in the left MCA territory. [JT]  Wed Jan 23, 2024  1536 Patient reevaluated.  Reports improvement in symptoms.  Comfortable going home.  Remains without any neurodeficits.  Discharge home with strict return precautions. [JT]    Clinical Course User Index [JT] Donnajean Lynwood DEL, PA-C                                 Medical Decision Making Amount and/or Complexity of Data Reviewed Radiology: ordered. Decision-making details documented in ED Course.  Risk OTC drugs. Prescription drug management.   This patient presents to the ED with chief complaint(s) of headache.  The complaint involves an extensive differential diagnosis and also carries with it a high risk of complications and morbidity.   Pertinent past medical history as listed in HPI  The differential  diagnosis includes  Exam and history not consistent with subarachnoid hemorrhage.  Do not suspect CVA or TIA.  He is afebrile without nuchal rigidity do not suspect meningitis.  Additional history obtained: Additional history obtained from significant other Records reviewed Care Everywhere/External Records  Disposition:   Patient will be discharged home. The patient has been appropriately medically screened and/or stabilized in the ED. I have low suspicion for any other emergent medical condition which would require further screening, evaluation or treatment in the ED or require inpatient management. At time of discharge the patient is hemodynamically stable and in no acute distress. I have discussed work-up results and diagnosis with patient and answered all questions. Patient is agreeable with discharge plan. We discussed strict return precautions for returning to the emergency department and they verbalized understanding.     Social Determinants of Health:   none  This note was dictated with voice recognition software.  Despite best efforts at proofreading, errors may have occurred which can change the documentation meaning.       Final diagnoses:  Bad headache    ED Discharge Orders     None          Donnajean Lynwood DEL, PA-C 01/23/24 1537    Pamella Sharper A, DO 01/29/24 1517

## 2024-01-17 ENCOUNTER — Ambulatory Visit: Admitting: *Deleted

## 2024-01-17 ENCOUNTER — Encounter: Payer: Self-pay | Admitting: *Deleted

## 2024-01-17 DIAGNOSIS — M25671 Stiffness of right ankle, not elsewhere classified: Secondary | ICD-10-CM

## 2024-01-17 DIAGNOSIS — M25571 Pain in right ankle and joints of right foot: Secondary | ICD-10-CM

## 2024-01-17 DIAGNOSIS — R252 Cramp and spasm: Secondary | ICD-10-CM

## 2024-01-17 NOTE — Therapy (Addendum)
 OUTPATIENT PHYSICAL THERAPY LOWER EXTREMITY TREATMENT   Patient Name: [name]  [name] MRN: [id number] DOB:Jan 24, 1966, 58 y.o., female Today's Date: 01/17/2024  END OF SESSION:  PT End of Session - 01/17/24 1304     Visit Number 3    Number of Visits 16    Date for PT Re-Evaluation 02/26/24    Authorization Type MCR    PT Start Time 1300    PT Stop Time 1350    PT Time Calculation (min) 50 min          Past Medical History:  Diagnosis Date   Anxiety    Asthma    Blood transfusion without reported diagnosis    2010 CABG   CAD (coronary artery disease)    4v CABG, 9/10; NL LVF, status post followup Cardiolite November 2012 no ischemia ejection fraction 65%   Cataract    are forming   Diabetes mellitus    Dyslipidemia     LDL 22 mg percent on Crestor    Esophageal stricture    Fatty liver    Gastroparesis due to DM (HCC)    GERD (gastroesophageal reflux disease)    Glaucoma    Hyperlipidemia    Hypertension    Hypothyroidism    Knee injury, right, initial encounter    Left-sided chest wall pain    Myocardial infarction (HCC) 2010   2008 x 2 stents, 2009 x 2 stents. 01-2009 CABG x 4    Stroke [facility])    Past Surgical History:  Procedure Laterality Date   ANGIOPLASTY     with stenting 2008,2009   CARDIAC SURGERY     CESAREAN SECTION     CORONARY ARTERY BYPASS GRAFT  01/26/2009   2008-2 stents; 2009-2 stents   EYE SURGERY     IR CT HEAD LTD  01/03/2022   IR PERCUTANEOUS ART THROMBECTOMY/INFUSION INTRACRANIAL INC DIAG ANGIO  01/03/2022   IR US  GUIDE VASC ACCESS RIGHT  01/03/2022   LEFT HEART CATH AND CORS/GRAFTS ANGIOGRAPHY N/A 01/03/2022   Procedure: LEFT HEART CATH AND CORS/GRAFTS ANGIOGRAPHY;  Surgeon: Burnard Debby LABOR, MD;  Location: [facility] INVASIVE CV LAB;  Service: Cardiovascular;  Laterality: N/A;   open heart surgery   2010   RADIOLOGY WITH ANESTHESIA N/A 01/03/2022   Procedure: IR WITH ANESTHESIA;  Surgeon: Radiologist, Medication, MD;  Location: [facility] OR;  Service:  Radiology;  Laterality: N/A;   TUBAL LIGATION     Patient Active Problem List   Diagnosis Date Noted   Diabetes mellitus due to underlying condition with complication, with long-term current use of insulin  (HCC) 03/20/2023   Acute ischemic left MCA stroke (HCC) 01/03/2022   NSTEMI (non-ST elevated myocardial infarction) (HCC) 12/31/2021   Derangement of right knee 11/27/2021   Screening mammogram for breast cancer 12/14/2020   Encounter for screening fecal occult blood testing 12/14/2020   Post-menopause 12/14/2020   Encounter for well woman exam with routine gynecological exam 12/14/2020   Acute respiratory failure with hypoxia (HCC) 04/17/2019   Uncontrolled type 1 diabetes mellitus with hypoglycemia without coma, with long-term current use of insulin  (HCC) 04/17/2019   COVID-19 virus infection    Pneumonia due to COVID-19 virus 04/16/2019   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 (HCC) 03/01/2019   Acute bronchitis 07/18/2017   Oral thrush 04/19/2017   Chronic seasonal allergic rhinitis 03/19/2017   Diabetic retinopathy of both eyes without macular edema associated with type 2 diabetes mellitus (HCC) 08/30/2016   Mild persistent asthma without complication 07/06/2016  Diabetes mellitus with complication (HCC)    Atypical chest pain    Muscle spasm 08/20/2014   Plantar fasciitis of right foot 03/04/2014   Obesity 08/03/2013   Hx of CABG    Ejection fraction    HTN (hypertension) 08/22/2012   Adjustment disorder with mixed anxiety and depressed mood 03/05/2012   Insulin  pump in place 02/06/2012   Hypothyroidism 09/29/2011   Edema 08/25/2011   Gastroparesis 09/07/2010   Vitamin D deficiency 08/10/2010   Hyperlipidemia 02/21/2010   Muscle spasm of left shoulder 04/29/2009   PALPITATIONS 04/12/2009   WHEEZING 04/12/2009   GERD 01/19/2009   Chest pain 02/04/2008   Diabetes mellitus type 2, insulin  dependent (HCC) 06/12/2007   Anxiety disorder  06/12/2007   Coronary artery disease involving coronary bypass graft of native heart 06/12/2007   Asthma in adult 06/12/2007    PCP: Pcp, No   REFERRING PROVIDER: Arnaldo Juliene RAMAN, MD   REFERRING DIAG: [phone] (ICD-10-CM) - Pain in left ankle and joints of left foot   THERAPY DIAG:  Pain in right ankle and joints of right foot  Stiffness of right ankle, not elsewhere classified  Cramp and spasm  Rationale for Evaluation and Treatment: Rehabilitation  ONSET DATE: 1 year to 1.5 year  SUBJECTIVE:   SUBJECTIVE STATEMENT: .RT ankle pain/ swelling comes and goes. Sore today. Wear ankle brace at home. Tband ex's hurt   PERTINENT HISTORY: CAD, CVA during last hear procedure with no residual effects, DM with insulin  pump,  PAIN:  Are you having pain? Yes: NPRS scale: 6-7/10 Pain location: R medial ankle Pain description: throbbing sometimes sharp Aggravating factors: standing to wash dishes and mopping floors, walking Relieving factors: rest, NWB  PRECAUTIONS: None  RED FLAGS: None   WEIGHT BEARING RESTRICTIONS: No  FALLS:  Has patient fallen in last 6 months? No  LIVING ENVIRONMENT: Lives with: lives alone Lives in: House/apartment Stairs: Yes: External: 19 steps; can reach both Has following equipment at home: None  Uses step to gait on stairs  OCCUPATION: on disability due to heart disease  PLOF: Independent  PATIENT GOALS: decrease pain, stand longer than 30 min and make chores easier  NEXT MD VISIT: none scheduled yet  OBJECTIVE:  Note: Objective measures were completed at Evaluation unless otherwise noted.  DIAGNOSTIC FINDINGS: xrays - can't find results  PATIENT SURVEYS:  LEFS 34 / 80 = 42.5 %  COGNITION: Overall cognitive status: Within functional limits for tasks assessed     SENSATION: WFL  EDEMA:  Visible edema compared to L distal to medial malleolus  MUSCLE LENGTH: Tight gastrocs B  POSTURE: B pes planus, increased pronation of  R, hip in ER    PALPATION: Marked TTP along medial tibial border, ext digitorum brevis, over navicular and with navicular mobs  LOWER EXTREMITY ROM:  A/P ROM Right eval Left eval  Ankle dorsiflexion -2/2 3/4  Ankle plantarflexion 65 64  Ankle inversion 16/24 35  Ankle eversion 28/28 13/36   (Blank rows = not tested)  LOWER EXTREMITY MMT: R eversion releases due to pain, else 5/5. Pain on R with eversion and plantar flexion   GAIT: WNL; Stairs require step to gait secondary to pain  TREATMENT DATE:   01/17/24   Reviewed HEP  Ankle isometrics Seated heel raises                                    EXERCISE LOG         RT ankle  Exercise Repetitions and Resistance Comments  Nustep L4 x 10.5 mins   Rocker board PF/DF x 4 mins   Dyna disc CW/CCW  x 4 mins   INV/ EV           Blank cell = exercise not performed today  Manual:  STW to  Tib posterior / medial aspect  RT ankle  IFC estim 80-150 Hz x 15 min to R ankle  PATIENT EDUCATION:  Education details: PT eval findings, anticipated POC, initial HEP, and discussed ice application   Person educated: Patient Education method: Explanation, Demonstration, Tactile cues, Verbal cues, and Handouts Education comprehension: verbalized understanding and returned demonstration  HOME EXERCISE PROGRAM: Access Code: [email or web address] URL: [email or web address] Date: 01/01/2024 Prepared by: Mliss  Exercises - Isometric Ankle Inversion  - 2 x daily - 7 x weekly - 1 sets - 10 reps - 5 sec hold - Isometric Ankle Eversion at Wall  - 2 x daily - 7 x weekly - 1 sets - 10 reps - 5 hold - Long Sitting Isometric Ankle Plantarflexion with Ball at [employer]  - 2 x daily - 7 x weekly - 1 sets - 10 reps - 5 hold - Seated Heel Raise  - 2 x daily - 7 x weekly - 2 sets - 10 reps  ASSESSMENT:  CLINICAL IMPRESSION: Patient  arrived today doing fairly well with RT ankle, but continues to have medial swelling and pain. She was able to perform  OKC and CKC exs for ROM and light strengthing as well as STW did well. IFC end of session .      OBJECTIVE IMPAIRMENTS: decreased activity tolerance, difficulty walking, decreased ROM, decreased strength, increased edema, increased muscle spasms, impaired flexibility, postural dysfunction, and pain.   ACTIVITY LIMITATIONS: standing, squatting, stairs, dressing, and locomotion level  PARTICIPATION LIMITATIONS: meal prep, cleaning, laundry, driving, shopping, and community activity  PERSONAL FACTORS: Fitness, Time since onset of injury/illness/exacerbation, and 1-2 comorbidities: DM, heart disease are also affecting patient's functional outcome.   REHAB POTENTIAL: Good  CLINICAL DECISION MAKING: Evolving/moderate complexity  EVALUATION COMPLEXITY: Moderate   GOALS: Goals reviewed with patient? Yes  SHORT TERM GOALS: Target date: 01/29/2024 Patient will be independent with initial HEP. Baseline:  Goal status: INITIAL  2.  Decreased pain with weightbearing by 30% to increase standing and walking tolerances. Baseline:  Goal status: INITIAL    LONG TERM GOALS: Target date: 02/26/2024  Patient will be independent with advanced/ongoing HEP to improve outcomes and carryover.  Baseline:  Goal status: INITIAL  2.  Patient will report at least 75% improvement in R ankle/foot pain with standing, walking and household chores.  Baseline:  Goal status: INITIAL  3.  Patient will demonstrate improved R ankle AROM by 5-10 deg in DF, Inv and Eversion to allow for normal gait and stair mechanics. Baseline: see objective Goal status: INITIAL  4. Patient will be able to ascend/descend stairs with 1 HR and reciprocal step pattern safely to access home and community.  Baseline:  Goal status: INITIAL  5.  Improved LEFS by >= 9 points showing functional improvement Baseline:  34 / 80  Goal status: INITIAL     PLAN:  PT FREQUENCY: 2x/week  PT DURATION: 8 weeks  PLANNED INTERVENTIONS: 97164- PT Re-evaluation, 97110-Therapeutic exercises, 97530- Therapeutic activity, 97112- Neuromuscular re-education, 97535- Self Care, [id number]- Manual therapy, [phone]- Gait training, [phone]- Aquatic Therapy, [phone]- Electrical stimulation (unattended), [phone]- Ionotophoresis 4mg /ml Dexamethasone , 79439 (1-2 muscles), 20561 (3+ muscles)- Dry Needling, Patient/Family education, Balance training, Stair training, Taping, Joint mobilization, Cryotherapy, and Moist heat  PLAN FOR NEXT SESSION: Review and progress HEP, gastroc/soleus stretching, ankle ROM, , manual/DN to flexor digitorum and posterior tibialis    Janoah Menna,CHRIS, PTA 01/17/2024, 3:14 PM

## 2024-01-17 NOTE — Addendum Note (Signed)
 Addended by: Codey Burling, [place] W on: 01/17/2024 06:19 PM   Modules accepted: Orders

## 2024-01-24 ENCOUNTER — Encounter: Payer: Self-pay | Admitting: *Deleted

## 2024-01-24 ENCOUNTER — Ambulatory Visit: Attending: Sports Medicine | Admitting: *Deleted

## 2024-01-24 DIAGNOSIS — M25571 Pain in right ankle and joints of right foot: Secondary | ICD-10-CM | POA: Diagnosis present

## 2024-01-24 DIAGNOSIS — M25671 Stiffness of right ankle, not elsewhere classified: Secondary | ICD-10-CM | POA: Diagnosis present

## 2024-01-24 DIAGNOSIS — R252 Cramp and spasm: Secondary | ICD-10-CM | POA: Diagnosis present

## 2024-01-24 NOTE — Therapy (Signed)
 OUTPATIENT PHYSICAL THERAPY LOWER EXTREMITY TREATMENT   Patient Name: GAILE ALLMON MRN: [id number] DOB:1965-10-21, 58 y.o., female Today's Date: 01/24/2024  END OF SESSION:  PT End of Session - 01/24/24 1326     Visit Number 4    Number of Visits 16    Date for PT Re-Evaluation 02/26/24    Authorization Type MCR    Progress Note Due on Visit 10    PT Start Time 1308    PT Stop Time 1408    PT Time Calculation (min) 60 min          Past Medical History:  Diagnosis Date   Anxiety    Asthma    Blood transfusion without reported diagnosis    2010 CABG   CAD (coronary artery disease)    4v CABG, 9/10; NL LVF, status post followup Cardiolite November 2012 no ischemia ejection fraction 65%   Cataract    are forming   Diabetes mellitus    Dyslipidemia     LDL 22 mg percent on Crestor    Esophageal stricture    Fatty liver    Gastroparesis due to DM (HCC)    GERD (gastroesophageal reflux disease)    Glaucoma    Hyperlipidemia    Hypertension    Hypothyroidism    Knee injury, right, initial encounter    Left-sided chest wall pain    Myocardial infarction (HCC) 2010   2008 x 2 stents, 2009 x 2 stents. 01-2009 CABG x 4    Stroke [facility])    Past Surgical History:  Procedure Laterality Date   ANGIOPLASTY     with stenting 2008,2009   CARDIAC SURGERY     CESAREAN SECTION     CORONARY ARTERY BYPASS GRAFT  01/26/2009   2008-2 stents; 2009-2 stents   EYE SURGERY     IR CT HEAD LTD  01/03/2022   IR PERCUTANEOUS ART THROMBECTOMY/INFUSION INTRACRANIAL INC DIAG ANGIO  01/03/2022   IR US  GUIDE VASC ACCESS RIGHT  01/03/2022   LEFT HEART CATH AND CORS/GRAFTS ANGIOGRAPHY N/A 01/03/2022   Procedure: LEFT HEART CATH AND CORS/GRAFTS ANGIOGRAPHY;  Surgeon: Burnard Debby LABOR, MD;  Location: [facility] INVASIVE CV LAB;  Service: Cardiovascular;  Laterality: N/A;   open heart surgery   2010   RADIOLOGY WITH ANESTHESIA N/A 01/03/2022   Procedure: IR WITH ANESTHESIA;  Surgeon: Radiologist,  Medication, MD;  Location: [facility] OR;  Service: Radiology;  Laterality: N/A;   TUBAL LIGATION     Patient Active Problem List   Diagnosis Date Noted   Diabetes mellitus due to underlying condition with complication, with long-term current use of insulin  (HCC) 03/20/2023   Acute ischemic left MCA stroke (HCC) 01/03/2022   NSTEMI (non-ST elevated myocardial infarction) (HCC) 12/31/2021   Derangement of right knee 11/27/2021   Screening mammogram for breast cancer 12/14/2020   Encounter for screening fecal occult blood testing 12/14/2020   Post-menopause 12/14/2020   Encounter for well woman exam with routine gynecological exam 12/14/2020   Acute respiratory failure with hypoxia (HCC) 04/17/2019   Uncontrolled type 1 diabetes mellitus with hypoglycemia without coma, with long-term current use of insulin  (HCC) 04/17/2019   COVID-19 virus infection    Pneumonia due to COVID-19 virus 04/16/2019   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 (HCC) 03/01/2019   Acute bronchitis 07/18/2017   Oral thrush 04/19/2017   Chronic seasonal allergic rhinitis 03/19/2017   Diabetic retinopathy of both eyes without macular edema associated with type 2 diabetes mellitus (HCC) 08/30/2016  Mild persistent asthma without complication 07/06/2016   Diabetes mellitus with complication (HCC)    Atypical chest pain    Muscle spasm 08/20/2014   Plantar fasciitis of right foot 03/04/2014   Obesity 08/03/2013   Hx of CABG    Ejection fraction    HTN (hypertension) 08/22/2012   Adjustment disorder with mixed anxiety and depressed mood 03/05/2012   Insulin  pump in place 02/06/2012   Hypothyroidism 09/29/2011   Edema 08/25/2011   Gastroparesis 09/07/2010   Vitamin D deficiency 08/10/2010   Hyperlipidemia 02/21/2010   Muscle spasm of left shoulder 04/29/2009   PALPITATIONS 04/12/2009   WHEEZING 04/12/2009   GERD 01/19/2009   Chest pain 02/04/2008   Diabetes mellitus type 2, insulin  dependent  (HCC) 06/12/2007   Anxiety disorder 06/12/2007   Coronary artery disease involving coronary bypass graft of native heart 06/12/2007   Asthma in adult 06/12/2007    PCP: Pcp, No   REFERRING PROVIDER: Arnaldo Juliene RAMAN, MD   REFERRING DIAG: [phone] (ICD-10-CM) - Pain in left ankle and joints of left foot   THERAPY DIAG:  Pain in right ankle and joints of right foot  Stiffness of right ankle, not elsewhere classified  Rationale for Evaluation and Treatment: Rehabilitation  ONSET DATE: 1 year to 1.5 year  SUBJECTIVE:   SUBJECTIVE STATEMENT: .RT ankle pain/ swelling comes and goes. Not getting better still hurts.Wearing ankle brace today.   PERTINENT HISTORY: CAD, CVA during last hear procedure with no residual effects, DM with insulin  pump,  PAIN:  Are you having pain? Yes: NPRS scale: 6-7/10 Pain location: R medial ankle Pain description: throbbing sometimes sharp Aggravating factors: standing to wash dishes and mopping floors, walking Relieving factors: rest, NWB  PRECAUTIONS: None  RED FLAGS: None   WEIGHT BEARING RESTRICTIONS: No  FALLS:  Has patient fallen in last 6 months? No  LIVING ENVIRONMENT: Lives with: lives alone Lives in: House/apartment Stairs: Yes: External: 19 steps; can reach both Has following equipment at home: None  Uses step to gait on stairs  OCCUPATION: on disability due to heart disease  PLOF: Independent  PATIENT GOALS: decrease pain, stand longer than 30 min and make chores easier  NEXT MD VISIT: none scheduled yet  OBJECTIVE:  Note: Objective measures were completed at Evaluation unless otherwise noted.  DIAGNOSTIC FINDINGS: xrays - can't find results  PATIENT SURVEYS:  LEFS 34 / 80 = 42.5 %  COGNITION: Overall cognitive status: Within functional limits for tasks assessed     SENSATION: WFL  EDEMA:  Visible edema compared to L distal to medial malleolus  MUSCLE LENGTH: Tight gastrocs B  POSTURE: B pes planus,  increased pronation of R, hip in ER    PALPATION: Marked TTP along medial tibial border, ext digitorum brevis, over navicular and with navicular mobs  LOWER EXTREMITY ROM:  A/P ROM Right eval Left eval  Ankle dorsiflexion -2/2 3/4  Ankle plantarflexion 65 64  Ankle inversion 16/24 35  Ankle eversion 28/28 13/36   (Blank rows = not tested)  LOWER EXTREMITY MMT: R eversion releases due to pain, else 5/5. Pain on R with eversion and plantar flexion   GAIT: WNL; Stairs require step to gait secondary to pain  TREATMENT DATE:   01-24-24  Discussed more supportive foot wear to decrease stress on RT foot/ankle                                    EXERCISE LOG         RT ankle  Exercise Repetitions and Resistance Comments  Nustep L4 x 11 mins   Rocker board PF/DF x 4 mins   Dyna disc CW/CCW  x 4 mins   INV/ EV           Blank cell = exercise not performed today  Manual:  STW to  Tib posterior / medial aspect  RT ankle as well as PROM. DF ROM  IFC estim 80-150 Hz x 15 min to R ankle  PATIENT EDUCATION:  Education details: PT eval findings, anticipated POC, initial HEP, and discussed ice application   Person educated: Patient Education method: Explanation, Demonstration, Tactile cues, Verbal cues, and Handouts Education comprehension: verbalized understanding and returned demonstration  HOME EXERCISE PROGRAM: Access Code: [email or web address] URL: [email or web address] Date: 01/01/2024 Prepared by: Mliss  Exercises - Isometric Ankle Inversion  - 2 x daily - 7 x weekly - 1 sets - 10 reps - 5 sec hold - Isometric Ankle Eversion at Wall  - 2 x daily - 7 x weekly - 1 sets - 10 reps - 5 hold - Long Sitting Isometric Ankle Plantarflexion with Ball at [employer]  - 2 x daily - 7 x weekly - 1 sets - 10 reps - 5 hold - Seated Heel Raise  - 2 x daily - 7 x weekly - 2  sets - 10 reps  ASSESSMENT:  CLINICAL IMPRESSION: Patient arrived today doing fairly well with RT ankle, but continues to have medial jt  swelling and pain. She was able to perform  OKC and CKC exs for ROM and light strengthing as well as STW  and PROM. DF ROM 12 degrees today. We discussed wearing brace and more supportive shoes to decrease ankle stress. Vaso and IFC end of session .Pain decreased to 5-6/10 end of session.      OBJECTIVE IMPAIRMENTS: decreased activity tolerance, difficulty walking, decreased ROM, decreased strength, increased edema, increased muscle spasms, impaired flexibility, postural dysfunction, and pain.   ACTIVITY LIMITATIONS: standing, squatting, stairs, dressing, and locomotion level  PARTICIPATION LIMITATIONS: meal prep, cleaning, laundry, driving, shopping, and community activity  PERSONAL FACTORS: Fitness, Time since onset of injury/illness/exacerbation, and 1-2 comorbidities: DM, heart disease are also affecting patient's functional outcome.   REHAB POTENTIAL: Good  CLINICAL DECISION MAKING: Evolving/moderate complexity  EVALUATION COMPLEXITY: Moderate   GOALS: Goals reviewed with patient? Yes  SHORT TERM GOALS: Target date: 01/29/2024 Patient will be independent with initial HEP. Baseline:  Goal status: MET  2.  Decreased pain with weightbearing by 30% to increase standing and walking tolerances. Baseline:  Goal status: On going    LONG TERM GOALS: Target date: 02/26/2024  Patient will be independent with advanced/ongoing HEP to improve outcomes and carryover.  Baseline:  Goal status: INITIAL  2.  Patient will report at least 75% improvement in R ankle/foot pain with standing, walking and household chores.  Baseline:  Goal status: INITIAL  3.  Patient will demonstrate improved R ankle AROM by 5-10 deg in DF, Inv and Eversion to allow for normal gait and stair mechanics. Baseline: see objective Goal status: INITIAL  4. Patient will be  able to ascend/descend  stairs with 1 HR and reciprocal step pattern safely to access home and community.  Baseline:  Goal status: INITIAL  5.  Improved LEFS by >= 9 points showing functional improvement Baseline: 34 / 80  Goal status: INITIAL     PLAN:  PT FREQUENCY: 2x/week  PT DURATION: 8 weeks  PLANNED INTERVENTIONS: 97164- PT Re-evaluation, 97110-Therapeutic exercises, 97530- Therapeutic activity, 97112- Neuromuscular re-education, 97535- Self Care, 02859- Manual therapy, [id number]- Gait training, [phone]- Aquatic Therapy, [phone]- Electrical stimulation (unattended), [phone]- Ionotophoresis 4mg /ml Dexamethasone , 79439 (1-2 muscles), 20561 (3+ muscles)- Dry Needling, Patient/Family education, Balance training, Stair training, Taping, Joint mobilization, Cryotherapy, and Moist heat  PLAN FOR NEXT SESSION: Vaso added to interventions with Recert.   gastroc/soleus stretching, ankle ROM, , manual/DN to flexor digitorum and posterior tibialis    Tameah Mihalko,CHRIS, PTA 01/24/2024, 2:21 PM

## 2024-01-28 ENCOUNTER — Encounter: Admitting: Adult Health

## 2024-01-29 ENCOUNTER — Ambulatory Visit: Admitting: *Deleted

## 2024-01-29 DIAGNOSIS — M25571 Pain in right ankle and joints of right foot: Secondary | ICD-10-CM | POA: Diagnosis not present

## 2024-01-29 DIAGNOSIS — R252 Cramp and spasm: Secondary | ICD-10-CM

## 2024-01-29 DIAGNOSIS — M25671 Stiffness of right ankle, not elsewhere classified: Secondary | ICD-10-CM

## 2024-01-29 NOTE — Therapy (Signed)
 OUTPATIENT PHYSICAL THERAPY LOWER EXTREMITY TREATMENT   Patient Name: [name]  ADRINNE SZE MRN: [id number] DOB:07-02-1965, 58 y.o., female Today's Date: 01/29/2024  END OF SESSION:  PT End of Session - 01/29/24 1311     Visit Number 5    Number of Visits 16    Date for PT Re-Evaluation 02/26/24    Authorization Type MCR    Activity Tolerance Patient tolerated treatment well    Behavior During Therapy [facility] for tasks assessed/performed          Past Medical History:  Diagnosis Date   Anxiety    Asthma    Blood transfusion without reported diagnosis    2010 CABG   CAD (coronary artery disease)    4v CABG, 9/10; NL LVF, status post followup Cardiolite November 2012 no ischemia ejection fraction 65%   Cataract    are forming   Diabetes mellitus    Dyslipidemia     LDL 22 mg percent on Crestor    Esophageal stricture    Fatty liver    Gastroparesis due to DM (HCC)    GERD (gastroesophageal reflux disease)    Glaucoma    Hyperlipidemia    Hypertension    Hypothyroidism    Knee injury, right, initial encounter    Left-sided chest wall pain    Myocardial infarction (HCC) 2010   2008 x 2 stents, 2009 x 2 stents. 01-2009 CABG x 4    Stroke [facility])    Past Surgical History:  Procedure Laterality Date   ANGIOPLASTY     with stenting 2008,2009   CARDIAC SURGERY     CESAREAN SECTION     CORONARY ARTERY BYPASS GRAFT  01/26/2009   2008-2 stents; 2009-2 stents   EYE SURGERY     IR CT HEAD LTD  01/03/2022   IR PERCUTANEOUS ART THROMBECTOMY/INFUSION INTRACRANIAL INC DIAG ANGIO  01/03/2022   IR US  GUIDE VASC ACCESS RIGHT  01/03/2022   LEFT HEART CATH AND CORS/GRAFTS ANGIOGRAPHY N/A 01/03/2022   Procedure: LEFT HEART CATH AND CORS/GRAFTS ANGIOGRAPHY;  Surgeon: Burnard Debby LABOR, MD;  Location: [facility] INVASIVE CV LAB;  Service: Cardiovascular;  Laterality: N/A;   open heart surgery   2010   RADIOLOGY WITH ANESTHESIA N/A 01/03/2022   Procedure: IR WITH ANESTHESIA;  Surgeon: Radiologist,  Medication, MD;  Location: [facility] OR;  Service: Radiology;  Laterality: N/A;   TUBAL LIGATION     Patient Active Problem List   Diagnosis Date Noted   Diabetes mellitus due to underlying condition with complication, with long-term current use of insulin  (HCC) 03/20/2023   Acute ischemic left MCA stroke (HCC) 01/03/2022   NSTEMI (non-ST elevated myocardial infarction) (HCC) 12/31/2021   Derangement of right knee 11/27/2021   Screening mammogram for breast cancer 12/14/2020   Encounter for screening fecal occult blood testing 12/14/2020   Post-menopause 12/14/2020   Encounter for well woman exam with routine gynecological exam 12/14/2020   Acute respiratory failure with hypoxia (HCC) 04/17/2019   Uncontrolled type 1 diabetes mellitus with hypoglycemia without coma, with long-term current use of insulin  (HCC) 04/17/2019   COVID-19 virus infection    Pneumonia due to COVID-19 virus 04/16/2019   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 (HCC) 03/01/2019   Acute bronchitis 07/18/2017   Oral thrush 04/19/2017   Chronic seasonal allergic rhinitis 03/19/2017   Diabetic retinopathy of both eyes without macular edema associated with type 2 diabetes mellitus (HCC) 08/30/2016   Mild persistent asthma without complication 07/06/2016   Diabetes mellitus with  complication (HCC)    Atypical chest pain    Muscle spasm 08/20/2014   Plantar fasciitis of right foot 03/04/2014   Obesity 08/03/2013   Hx of CABG    Ejection fraction    HTN (hypertension) 08/22/2012   Adjustment disorder with mixed anxiety and depressed mood 03/05/2012   Insulin  pump in place 02/06/2012   Hypothyroidism 09/29/2011   Edema 08/25/2011   Gastroparesis 09/07/2010   Vitamin D deficiency 08/10/2010   Hyperlipidemia 02/21/2010   Muscle spasm of left shoulder 04/29/2009   PALPITATIONS 04/12/2009   WHEEZING 04/12/2009   GERD 01/19/2009   Chest pain 02/04/2008   Diabetes mellitus type 2, insulin  dependent  (HCC) 06/12/2007   Anxiety disorder 06/12/2007   Coronary artery disease involving coronary bypass graft of native heart 06/12/2007   Asthma in adult 06/12/2007    PCP: Pcp, No   REFERRING PROVIDER: Arnaldo Juliene RAMAN, MD   REFERRING DIAG: [phone] (ICD-10-CM) - Pain in left ankle and joints of left foot   THERAPY DIAG:  Pain in right ankle and joints of right foot  Stiffness of right ankle, not elsewhere classified  Cramp and spasm  Rationale for Evaluation and Treatment: Rehabilitation  ONSET DATE: 1 year to 1.5 year  SUBJECTIVE:   SUBJECTIVE STATEMENT: .RT ankle pain is less today 5/10. Wearing ankle brace today and better shoes.   PERTINENT HISTORY: CAD, CVA during last hear procedure with no residual effects, DM with insulin  pump,  PAIN:  Are you having pain? Yes: NPRS scale: 5/10 Pain location: R medial ankle Pain description: throbbing sometimes sharp Aggravating factors: standing to wash dishes and mopping floors, walking Relieving factors: rest, NWB  PRECAUTIONS: None  RED FLAGS: None   WEIGHT BEARING RESTRICTIONS: No  FALLS:  Has patient fallen in last 6 months? No  LIVING ENVIRONMENT: Lives with: lives alone Lives in: House/apartment Stairs: Yes: External: 19 steps; can reach both Has following equipment at home: None  Uses step to gait on stairs  OCCUPATION: on disability due to heart disease  PLOF: Independent  PATIENT GOALS: decrease pain, stand longer than 30 min and make chores easier  NEXT MD VISIT: none scheduled yet  OBJECTIVE:  Note: Objective measures were completed at Evaluation unless otherwise noted.  DIAGNOSTIC FINDINGS: xrays - can't find results  PATIENT SURVEYS:  LEFS 34 / 80 = 42.5 %  COGNITION: Overall cognitive status: Within functional limits for tasks assessed     SENSATION: WFL  EDEMA:  Visible edema compared to L distal to medial malleolus  MUSCLE LENGTH: Tight gastrocs B  POSTURE: B pes planus,  increased pronation of R, hip in ER    PALPATION: Marked TTP along medial tibial border, ext digitorum brevis, over navicular and with navicular mobs  LOWER EXTREMITY ROM:  A/P ROM Right eval Left eval  Ankle dorsiflexion -2/2 3/4  Ankle plantarflexion 65 64  Ankle inversion 16/24 35  Ankle eversion 28/28 13/36   (Blank rows = not tested)  LOWER EXTREMITY MMT: R eversion releases due to pain, else 5/5. Pain on R with eversion and plantar flexion   GAIT: WNL; Stairs require step to gait secondary to pain  TREATMENT DATE:   01-29-24  Discussed more supportive foot wear to decrease stress on RT foot/ankle                                    EXERCISE LOG         RT ankle  Exercise Repetitions and Resistance Comments  Nustep L4 x 14 mins   Rocker board standing PF/DF x 4 mins   Dyna disc   seated CW/CCW  x 6 mins   INV/ EV           Blank cell = exercise not performed today  Manual:   Vaso x 15 mins to RT ankle  IFC estim 80-150 Hz x 15 min to R ankle  PATIENT EDUCATION:  Education details: PT eval findings, anticipated POC, initial HEP, and discussed ice application   Person educated: Patient Education method: Explanation, Demonstration, Tactile cues, Verbal cues, and Handouts Education comprehension: verbalized understanding and returned demonstration  HOME EXERCISE PROGRAM: Access Code: [email or web address] URL: [email or web address] Date: 01/01/2024 Prepared by: Mliss  Exercises - Isometric Ankle Inversion  - 2 x daily - 7 x weekly - 1 sets - 10 reps - 5 sec hold - Isometric Ankle Eversion at Wall  - 2 x daily - 7 x weekly - 1 sets - 10 reps - 5 hold - Long Sitting Isometric Ankle Plantarflexion with Ball at [employer]  - 2 x daily - 7 x weekly - 1 sets - 10 reps - 5 hold - Seated Heel Raise  - 2 x daily - 7 x weekly - 2 sets - 10  reps  ASSESSMENT:  CLINICAL IMPRESSION: Patient arrived today doing better with decreased pain Rt foot.  She is wearing brace and new shoes for added ankle support.   She was able to perform therex longer and   rocker board in standing today without increased pain medial aspect. More supportive shoes and brace appear to help decrease ankle stress/ pain. Vaso and IFC end of session .Pain decreased to 4/10 end of session.      OBJECTIVE IMPAIRMENTS: decreased activity tolerance, difficulty walking, decreased ROM, decreased strength, increased edema, increased muscle spasms, impaired flexibility, postural dysfunction, and pain.   ACTIVITY LIMITATIONS: standing, squatting, stairs, dressing, and locomotion level  PARTICIPATION LIMITATIONS: meal prep, cleaning, laundry, driving, shopping, and community activity  PERSONAL FACTORS: Fitness, Time since onset of injury/illness/exacerbation, and 1-2 comorbidities: DM, heart disease are also affecting patient's functional outcome.   REHAB POTENTIAL: Good  CLINICAL DECISION MAKING: Evolving/moderate complexity  EVALUATION COMPLEXITY: Moderate   GOALS: Goals reviewed with patient? Yes  SHORT TERM GOALS: Target date: 01/29/2024 Patient will be independent with initial HEP. Baseline:  Goal status: MET  2.  Decreased pain with weightbearing by 30% to increase standing and walking tolerances. Baseline:  Goal status: MET    LONG TERM GOALS: Target date: 02/26/2024  Patient will be independent with advanced/ongoing HEP to improve outcomes and carryover.  Baseline:  Goal status: INITIAL  2.  Patient will report at least 75% improvement in R ankle/foot pain with standing, walking and household chores.  Baseline:  Goal status: INITIAL  3.  Patient will demonstrate improved R ankle AROM by 5-10 deg in DF, Inv and Eversion to allow for normal gait and stair mechanics. Baseline: see objective Goal status: INITIAL  4. Patient will be able to  ascend/descend stairs with 1 HR and reciprocal step pattern  safely to access home and community.  Baseline:  Goal status: INITIAL  5.  Improved LEFS by >= 9 points showing functional improvement Baseline: 34 / 80  Goal status: INITIAL     PLAN:  PT FREQUENCY: 2x/week  PT DURATION: 8 weeks  PLANNED INTERVENTIONS: 97164- PT Re-evaluation, 97110-Therapeutic exercises, 97530- Therapeutic activity, 97112- Neuromuscular re-education, 97535- Self Care, 02859- Manual therapy, [id number]- Gait training, [phone]- Aquatic Therapy, [phone]- Electrical stimulation (unattended), [phone]- Ionotophoresis 4mg /ml Dexamethasone , 79439 (1-2 muscles), 20561 (3+ muscles)- Dry Needling, Patient/Family education, Balance training, Stair training, Taping, Joint mobilization, Cryotherapy, and Moist heat  PLAN FOR NEXT SESSION: Vaso added to interventions with Recert.   gastroc/soleus stretching, ankle ROM, , manual/DN to flexor digitorum and posterior tibialis    Senan Urey,CHRIS, PTA 01/29/2024, 1:14 PM

## 2024-01-31 ENCOUNTER — Encounter: Admitting: *Deleted

## 2024-02-05 ENCOUNTER — Encounter

## 2024-02-05 NOTE — Progress Notes (Signed)
 This encounter was created in error - please disregard.

## 2024-02-07 ENCOUNTER — Encounter

## 2024-02-16 ENCOUNTER — Other Ambulatory Visit: Payer: Self-pay | Admitting: Cardiology

## 2024-02-16 DIAGNOSIS — E785 Hyperlipidemia, unspecified: Secondary | ICD-10-CM

## 2024-02-22 ENCOUNTER — Other Ambulatory Visit: Payer: Self-pay | Admitting: Cardiology

## 2024-03-08 ENCOUNTER — Other Ambulatory Visit: Payer: Self-pay | Admitting: Cardiology

## 2024-03-14 ENCOUNTER — Other Ambulatory Visit (HOSPITAL_BASED_OUTPATIENT_CLINIC_OR_DEPARTMENT_OTHER): Payer: Self-pay | Admitting: Cardiology

## 2024-03-14 DIAGNOSIS — I25708 Atherosclerosis of coronary artery bypass graft(s), unspecified, with other forms of angina pectoris: Secondary | ICD-10-CM

## 2024-04-08 ENCOUNTER — Ambulatory Visit: Admitting: Adult Health

## 2024-04-08 ENCOUNTER — Other Ambulatory Visit (HOSPITAL_COMMUNITY)
Admission: RE | Admit: 2024-04-08 | Discharge: 2024-04-08 | Disposition: A | Source: Ambulatory Visit | Attending: Adult Health | Admitting: Adult Health

## 2024-04-08 ENCOUNTER — Encounter: Payer: Self-pay | Admitting: Adult Health

## 2024-04-08 VITALS — BP 129/81 | HR 73 | Ht 64.0 in | Wt 243.5 lb

## 2024-04-08 DIAGNOSIS — Z113 Encounter for screening for infections with a predominantly sexual mode of transmission: Secondary | ICD-10-CM | POA: Diagnosis not present

## 2024-04-08 DIAGNOSIS — I10 Essential (primary) hypertension: Secondary | ICD-10-CM | POA: Diagnosis not present

## 2024-04-08 DIAGNOSIS — Z1151 Encounter for screening for human papillomavirus (HPV): Secondary | ICD-10-CM | POA: Diagnosis not present

## 2024-04-08 DIAGNOSIS — Z124 Encounter for screening for malignant neoplasm of cervix: Secondary | ICD-10-CM | POA: Insufficient documentation

## 2024-04-08 DIAGNOSIS — Z01419 Encounter for gynecological examination (general) (routine) without abnormal findings: Secondary | ICD-10-CM

## 2024-04-08 DIAGNOSIS — Z1331 Encounter for screening for depression: Secondary | ICD-10-CM | POA: Diagnosis not present

## 2024-04-08 DIAGNOSIS — Z1231 Encounter for screening mammogram for malignant neoplasm of breast: Secondary | ICD-10-CM

## 2024-04-08 NOTE — Progress Notes (Signed)
 Patient ID: [name]  [name], female   DOB: [birth year], 58 y.o.   MRN: [id number] History of Present Illness: [name]  is a 58 year old white female, with SO,PM in for a well woman gyn exam and pap.  PCP is Vernell Pass NP in [place]   Current Medications, Allergies, Past Medical History, Past Surgical History, Family History and Social History were reviewed in [employer] record.     Review of Systems: Patient denies any headaches, hearing loss, fatigue, blurred vision, shortness of breath, chest pain, abdominal pain, problems with bowel movements, urination, or intercourse. No mood swings.  Has had pain in right ankle and saw ortho then sent to specialist and received steroid injection and is better.  Denies any vaginal bleeding   Physical Exam:BP 129/81 (BP Location: Left Arm, Patient Position: Sitting, Cuff Size: Large)   Pulse 73   Ht 5' 4 (1.626 m)   Wt 243 lb 8 oz (110.5 kg)   LMP 09/22/2019   BMI 41.80 kg/m   General:  Well developed, well nourished, no acute distress Skin:  Warm and dry Neck:  Midline trachea, normal thyroid , good ROM, no lymphadenopathy Lungs; Clear to auscultation bilaterally Breast:  No dominant palpable mass, retraction, or nipple discharge Cardiovascular: Regular rate and rhythm Abdomen:  Soft, non tender, no hepatosplenomegaly Pelvic:  External genitalia is normal in appearance, no lesions.  The vagina is normal in appearance. Urethra has no lesions or masses. The cervix is smooth, pap with HR HPV genotyping performed. Uterus is felt to be normal size, shape, and contour.  No adnexal masses or tenderness noted.Bladder is non tender, no masses felt. Rectal: Deferred  Extremities/musculoskeletal:  No swelling or varicosities noted, no clubbing or cyanosis Psych:  No mood changes, alert and cooperative,seems happy AA is 0 Fall risk is low    04/08/2024   11:43 AM 03/20/2023   11:52 AM 12/14/2020   11:34 AM  Depression  screen PHQ 2/9  Decreased Interest 0 0 0  Down, Depressed, Hopeless 1 0 0  PHQ - 2 Score 1 0 0  Altered sleeping 1 0 1  Tired, decreased energy 1 0 1  Change in appetite 0 0 1  Feeling bad or failure about yourself  1 0 0  Trouble concentrating 0 0 0  Moving slowly or fidgety/restless 0 0 0  Suicidal thoughts 0 0 0  PHQ-9 Score 4 0  3   Difficult doing work/chores  Not difficult at all      Data saved with a previous flowsheet row definition   She takes xanax  prn    04/08/2024   11:44 AM 03/20/2023   11:52 AM 12/14/2020   11:35 AM 11/28/2019   12:04 PM  GAD 7 : Generalized Anxiety Score  Nervous, Anxious, on Edge 1 0 0 0  Control/stop worrying 1 0 0 0  Worry too much - different things 1 1 0 0  Trouble relaxing 1 0 0 0  Restless 0 0 0 0  Easily annoyed or irritable 1 0 0 0  Afraid - awful might happen 1 0 0 0  Total GAD 7 Score 6 1 0 0  Anxiety Difficulty  Not difficult at all      Upstream - 04/08/24 1139       Pregnancy Intention Screening   Does the patient want to become pregnant in the next year? N/A    Does the patient's partner want to become pregnant in the next year? N/A  Would the patient like to discuss contraceptive options today? N/A      Contraception Wrap Up   Current Method Female Sterilization;Post-Menopause    End Method Female Sterilization;Post-Menopause    Contraception Counseling Provided No           Examination chaperoned by Clarita Salt LPN   Impression and plan: 1. Encounter for gynecological examination with Papanicolaou smear of cervix (Primary) Pap sent Pap in 3 years if normal Physical in 1 year Colonoscopy per GI Labs with PCP - Cytology - PAP( [facility])  2. Primary hypertension Continue meds and follow up with PCP  3. Screening mammogram for breast cancer Scheduled for [facility]  - MM 3D SCREENING MAMMOGRAM BILATERAL BREAST; Future

## 2024-04-10 ENCOUNTER — Ambulatory Visit (HOSPITAL_COMMUNITY)
Admission: RE | Admit: 2024-04-10 | Discharge: 2024-04-10 | Disposition: A | Discharge status: Home / Self Care | Source: Ambulatory Visit | Attending: Adult Health | Admitting: Adult Health

## 2024-04-10 ENCOUNTER — Other Ambulatory Visit (HOSPITAL_BASED_OUTPATIENT_CLINIC_OR_DEPARTMENT_OTHER): Payer: Self-pay | Admitting: Cardiology

## 2024-04-10 DIAGNOSIS — Z1231 Encounter for screening mammogram for malignant neoplasm of breast: Secondary | ICD-10-CM | POA: Insufficient documentation

## 2024-04-10 DIAGNOSIS — R600 Localized edema: Secondary | ICD-10-CM

## 2024-04-10 NOTE — Progress Notes (Signed)
 Cardiology Office Note:   Date:  04/11/2024  ID:  Kim  HAMNA [name], DOB Apr 10, 1966, MRN [id number] PCP: Leron Millman, NP  [facility] Providers Cardiologist:  Lynwood Schilling, MD {  History of Present Illness:   Kim  RIYAH [name] is a 58 y.o. female who presents for follow up of coronary artery disease and history of CABG. She underwent coronary angiography in 2014 and all of her bypass grafts were patent at that time.  She was in the hospital in August 2023 and had chest pain and she had a cath with disease as listed below.  However, she had a CVA as a result of the cath.   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  Since I last saw her she has had some fleeting chest discomfort.  This has been brief sharp discomfort in the left upper chest.  It comes and goes spontaneously.  She is not having any of the discomfort she had before her bypass.  She does have some exertional chest burning which is a stable pattern.  This happens only when she has been walking for a distance.  She is not having any resting discomfort and has no new shortness of breath, PND or orthopnea.  She has no new palpitations, presyncope or syncope.  She has no weight gain or edema.   ROS: As stated in the HPI and negative for all other systems.  Studies Reviewed:    EKG:   EKG Interpretation Date/Time:  Friday April 11 2024 11:14:21 EST Ventricular Rate:  79 PR Interval:  202 QRS Duration:  90 QT Interval:  390 QTC Calculation: 447 R Axis:   65  Text Interpretation: Normal sinus rhythm Nonspecific ST abnormality When compared with ECG of 15-Mar-2023 10:20, Vent. rate has increased BY  26 BPM Confirmed by Schilling Lynwood ([id number]) on 04/11/2024 11:24:19 AM    Risk Assessment/Calculations:              Physical Exam:   VS:  BP 128/72   Pulse 79    Ht 5' 4 (1.626 m)   Wt 247 lb 3.2 oz (112.1 kg)   LMP 09/22/2019   SpO2 96%   BMI 42.43 kg/m    Wt Readings from Last 3 Encounters:  04/11/24 247 lb 3.2 oz (112.1 kg)  04/08/24 243 lb 8 oz (110.5 kg)  01/14/24 235 lb (106.6 kg)     GEN: Well nourished, well developed in no acute distress NECK: No JVD; No carotid bruits CARDIAC: RRR, no murmurs, rubs, gallops RESPIRATORY:  Clear to auscultation without rales, wheezing or rhonchi  ABDOMEN: Soft, non-tender, non-distended EXTREMITIES:  Mild leg edema; No deformity   ASSESSMENT AND PLAN:   CAD:   The patient has no new sypmtoms.  No further cardiovascular testing is indicated.  We will continue with aggressive risk reduction and meds as listed.  The chest pain that she is describing is nonanginal.  She will continue with risk reduction.    HTN: Her blood pressure is at target.  No change in therapy.     Dyslipidemia:   I will get a follow a lipid profile fasting with a goal LDL in the 50s.  I had added Zetia  after the last lab draw.  Further adjustments will be based on the labs ordered.   DM:   She is following with an endocrinologist.  Her A1c was 8.0.  She is having this followed by endocrinology.  She said this is actually down from recent.  CVA:   She has had complete resolution of symptoms of a CVA that happened at the time of her last catheterization.  No change in therapy.  Weight: Patient has gained weight over the last couple of years and I gave her specific suggestions of measured weight loss over the next year.    Follow up with me in 1 year.  Signed, Lynwood Schilling, MD

## 2024-04-11 ENCOUNTER — Encounter: Payer: Self-pay | Admitting: Cardiology

## 2024-04-11 ENCOUNTER — Other Ambulatory Visit: Payer: Self-pay

## 2024-04-11 ENCOUNTER — Ambulatory Visit: Attending: Cardiology | Admitting: Cardiology

## 2024-04-11 VITALS — BP 128/72 | HR 79 | Ht 64.0 in | Wt 247.2 lb

## 2024-04-11 DIAGNOSIS — I2510 Atherosclerotic heart disease of native coronary artery without angina pectoris: Secondary | ICD-10-CM | POA: Insufficient documentation

## 2024-04-11 DIAGNOSIS — I10 Essential (primary) hypertension: Secondary | ICD-10-CM

## 2024-04-11 DIAGNOSIS — E785 Hyperlipidemia, unspecified: Secondary | ICD-10-CM | POA: Diagnosis present

## 2024-04-11 DIAGNOSIS — E118 Type 2 diabetes mellitus with unspecified complications: Secondary | ICD-10-CM

## 2024-04-11 DIAGNOSIS — I639 Cerebral infarction, unspecified: Secondary | ICD-10-CM | POA: Diagnosis present

## 2024-04-11 NOTE — Patient Instructions (Signed)
 Medication Instructions:  Your physician recommends that you continue on your current medications as directed. Please refer to the Current Medication list given to you today.  *If you need a refill on your cardiac medications before your next appointment, please call your pharmacy*  Lab Work: Fasting Lipids - please have this done at [facility]  Follow-Up: At [facility], you and your health needs are our priority.  As part of our continuing mission to provide you with exceptional heart care, our providers are all part of one team.  This team includes your primary Cardiologist (physician) and Advanced Practice Providers or APPs (Physician Assistants and Nurse Practitioners) who all work together to provide you with the care you need, when you need it.  Your next appointment:   1 year  Provider:   Lynwood Schilling, MD

## 2024-04-12 ENCOUNTER — Other Ambulatory Visit: Payer: Self-pay | Admitting: Cardiology

## 2024-04-13 ENCOUNTER — Other Ambulatory Visit (HOSPITAL_BASED_OUTPATIENT_CLINIC_OR_DEPARTMENT_OTHER): Payer: Self-pay | Admitting: Cardiology

## 2024-04-13 DIAGNOSIS — I25708 Atherosclerosis of coronary artery bypass graft(s), unspecified, with other forms of angina pectoris: Secondary | ICD-10-CM

## 2024-04-14 ENCOUNTER — Telehealth: Payer: Self-pay | Admitting: Cardiology

## 2024-04-14 ENCOUNTER — Ambulatory Visit: Payer: Self-pay | Admitting: Adult Health

## 2024-04-14 DIAGNOSIS — R600 Localized edema: Secondary | ICD-10-CM

## 2024-04-14 DIAGNOSIS — E785 Hyperlipidemia, unspecified: Secondary | ICD-10-CM

## 2024-04-14 LAB — CYTOLOGY - PAP
Chlamydia: NEGATIVE
Comment: NEGATIVE
Comment: NEGATIVE
Comment: NORMAL
Diagnosis: NEGATIVE
High risk HPV: NEGATIVE
Neisseria Gonorrhea: NEGATIVE

## 2024-04-14 MED ORDER — CLOPIDOGREL BISULFATE 75 MG PO TABS: 75.0000 mg | ORAL_TABLET | Freq: Every day | ORAL | 3 refills | Status: AC

## 2024-04-14 MED ORDER — FUROSEMIDE 40 MG PO TABS: 40.0000 mg | ORAL_TABLET | Freq: Every day | ORAL | 3 refills | Status: AC | PRN

## 2024-04-14 MED ORDER — EZETIMIBE 10 MG PO TABS: 10.0000 mg | ORAL_TABLET | Freq: Every day | ORAL | 3 refills | Status: AC

## 2024-04-14 MED ORDER — NITROGLYCERIN 0.4 MG SL SUBL: 0.4000 mg | SUBLINGUAL_TABLET | SUBLINGUAL | 3 refills | Status: AC | PRN

## 2024-04-14 NOTE — Telephone Encounter (Signed)
*  STAT* If patient is at the pharmacy, call can be transferred to refill team.   1. Which medications need to be refilled? (please list name of each medication and dose if known)   clopidogrel  (PLAVIX ) 75 MG tablet  TAKE 1 TABLET BY MOUTH EVERY DAY  nitroGLYCERIN  (NITROSTAT ) 0.4 MG SL tablet  1 TAB UNDER TONGUE EVERY 5 MINUTES X 3 DOSES AS NEED CHESTPAIN IF NO RELIEF AFTER 2ND DOSE,CALL 911  furosemide  (LASIX ) 40 MG tablet   Take 1 tablet (40 mg total) by mouth daily as needed.  ezetimibe  (ZETIA ) 10 MG tablet  TAKE 1 TABLET BY MOUTH EVERY DAY  2. Would you like to learn more about the convenience, safety, & potential cost savings by using the [facility]? No   3. Are you open to using the [facility] No  4. Which pharmacy/location (including street and city if local pharmacy) is medication to be sent to?[id number] - [place], [place] - [address]    5. Do they need a 30 day or 90 day supply? 90 Day Supply for all medications  Pt is currently out of medications.

## 2024-04-14 NOTE — Telephone Encounter (Signed)
 Refills sent

## 2024-04-14 NOTE — Telephone Encounter (Signed)
-----   Message from Hyndman sent at 04/14/2024  4:45 PM EST ----- Can you call her and let her know, she does not do mychart. THX Jenn

## 2024-04-14 NOTE — Telephone Encounter (Signed)
 Pt aware pap was negative for HPV, malignancy and GC/CHL. Next pap due in 3 years. Will let pt know when mammogram has been read. Pt voiced understanding. JSY

## 2024-04-23 LAB — LIPID PANEL
Chol/HDL Ratio: 3.3 ratio (ref 0.0–4.4)
Cholesterol, Total: 200 mg/dL — ABNORMAL HIGH (ref 100–199)
HDL: 61 mg/dL (ref >39)
LDL Chol Calc (NIH): 121 mg/dL — ABNORMAL HIGH (ref 0–99)
Triglycerides: 101 mg/dL (ref 0–149)
VLDL Cholesterol Cal: 18 mg/dL (ref 5–40)

## 2024-04-25 ENCOUNTER — Ambulatory Visit: Payer: Self-pay | Admitting: Cardiology

## 2024-04-25 DIAGNOSIS — E785 Hyperlipidemia, unspecified: Secondary | ICD-10-CM

## 2024-06-20 ENCOUNTER — Ambulatory Visit

## 2024-06-20 NOTE — Progress Notes (Unsigned)
 Patient ID: Kim  KAHMARI [name]                 DOB: 02-13-1966                    MRN: [id number]      HPI: Kim  LENOLA [name] is a 59 y.o. female patient referred to lipid clinic by Dr. Lavona. PMH is significant for CAD NSTEMI (12/2021),CVA (12/2021),HTN, T1DM, and HLD.  Patient was last evaluated by Dr. Lavona in November [name] for follow up appointment. Lipid panel ordered revealed elevated LDL (121) in December [name]. Patient referred to PharmD clinic to discuss Leqvio.  Patient presents today    Reviewed options for lowering LDL cholesterol, including ezetimibe , PCSK-9 inhibitors, bempedoic acid and inclisiran.  Discussed mechanisms of action, dosing, side effects and potential decreases in LDL cholesterol.  Also reviewed cost information and potential options for patient assistance.   Current Medications: Repatha  140 mg every 14 days, ezetimibe  10 mg daily,  Intolerances: ? Risk Factors: CAD NSTEMI (12/2021),CVA (12/2021),HTN, T1DM, and HLD LDL goal: < 55 Lipid panel (12/[name]): Chol 200, Trig 101, HDL 61, LDL 121 Liver enzymes (02/2023): AST 18, ALT 13, Alk phos 197 Lpa (02/2023): < 8.4 nmol/L  Diet:  Breakfast: Lunch/Dinner: Snacks: Beverages:  Exercise:   Family History:  Relation Problem Comments  Mother Metallurgist) Diabetes     Father (Deceased) Asthma   COPD   Heart disease     Sister - 2 half Metallurgist)   Brother - 2 half (Alive)   Paternal Aunt Asthma     Son (Alive) Asthma     Neg Hx Colon cancer   Colon polyps   Esophageal cancer   Rectal cancer   Stomach cancer       Social History:  Alcohol: Smoking:  Labs:  Lipid Panel     Component Value Date/Time   CHOL 200 (H) 12/02/[name] 1059   TRIG 101 12/02/[name] 1059   HDL 61 12/02/[name] 1059   CHOLHDL 3.3 12/02/[name] 1059   CHOLHDL 3.0 01/04/2022 0247   VLDL 21 01/04/2022 0247   LDLCALC 121 (H) 12/02/[name] 1059   LABVLDL 18 12/02/[name] 1059    Past Medical History:  Diagnosis Date   Anxiety    Asthma     Blood transfusion without reported diagnosis    2010 CABG   CAD (coronary artery disease)    4v CABG, 9/10; NL LVF, status post followup Cardiolite November 2012 no ischemia ejection fraction 65%   Cataract    are forming   Diabetes mellitus    Dyslipidemia     LDL 22 mg percent on Crestor    Esophageal stricture    Fatty liver    Gastroparesis due to DM (HCC)    GERD (gastroesophageal reflux disease)    Glaucoma    Hyperlipidemia    Hypertension    Hypothyroidism    Knee injury, right, initial encounter    Left-sided chest wall pain    Myocardial infarction (HCC) 2010   2008 x 2 stents, 2009 x 2 stents. 01-2009 CABG x 4    Stroke [facility])     Medications Ordered Prior to Encounter[1]  Allergies[2]  Assessment/Plan:  1. Hyperlipidemia -  No problems updated. No problem-specific Assessment & Plan notes found for this encounter.    Thank you,  [name] E. [name], Pharm.D, CPP [place] [name]. [facility] [address], [place], [place] [address] Phone: [phone]; Fax: [phone]        [  1]  Current Outpatient Medications on File Prior to Visit  Medication Sig Dispense Refill   albuterol  (VENTOLIN  HFA) 108 (90 Base) MCG/ACT inhaler Inhale 2 puffs into the lungs every 6 (six) hours as needed for wheezing or shortness of breath. 8 g 1   ALPRAZolam  (XANAX ) 0.5 MG tablet Take 0.25 mg by mouth 2 (two) times daily as needed.     aspirin  81 MG chewable tablet Chew 1 tablet (81 mg total) by mouth daily. 30 tablet 1   Budeson-Glycopyrrol-Formoterol  (BREZTRI  AEROSPHERE) 160-9-4.8 MCG/ACT AERO Inhale 2 puffs into the lungs in the morning and at bedtime. 10.7 g 0   clopidogrel  (PLAVIX ) 75 MG tablet Take 1 tablet (75 mg total) by mouth daily. 90 tablet 3   Evolocumab  (REPATHA  SURECLICK) 140 MG/ML SOAJ INJECT 140 MG INTO THE SKIN EVERY 14 (FOURTEEN) DAYS. 6 mL 3   ezetimibe  (ZETIA ) 10 MG tablet Take 1 tablet (10 mg total) by mouth daily.  90 tablet 3   fluticasone  (FLONASE ) 50 MCG/ACT nasal spray Place 1 spray into both nostrils daily for 7 days. 15.8 mL 0   furosemide  (LASIX ) 40 MG tablet Take 1 tablet (40 mg total) by mouth daily as needed. 90 tablet 3   HUMALOG 100 UNIT/ML injection SMARTSIG:0-300 Unit(s) SUB-Q Daily     levothyroxine  (SYNTHROID ) 137 MCG tablet TAKE 1 TABLET (137 MCG TOTAL) DAILY BEFORE BREAKFAST. **NEEDS TO BE SEEN BEFORE NEXT REFILL** 30 tablet 0   metoprolol  tartrate (LOPRESSOR ) 25 MG tablet TAKE 1.5 TABLETS (37.5 MG TOTAL) BY MOUTH TWICE A DAY 270 tablet 3   nitroGLYCERIN  (NITROSTAT ) 0.4 MG SL tablet Place 1 tablet (0.4 mg total) under the tongue every 5 (five) minutes as needed for chest pain. 25 tablet 3   omeprazole  (PRILOSEC) 20 MG capsule Take 20 mg by mouth daily.     oxyCODONE -acetaminophen  (PERCOCET/ROXICET) 5-325 MG tablet Take 1 tablet by mouth 3 (three) times daily as needed.     Semaglutide,0.25 or 0.5MG /DOS, 2 MG/3ML SOPN Inject 0.25 mg into the skin once a week.     triamcinolone ointment (KENALOG) 0.1 % Apply 1 application topically daily as needed.     No current facility-administered medications on file prior to visit.  [2]  Allergies Allergen Reactions   Metformin Anaphylaxis   Penicillins Anaphylaxis and Other (See Comments)    Has patient had a PCN reaction causing immediate rash, facial/tongue/throat swelling, SOB or lightheadedness with hypotension: Yes Has patient had a PCN reaction causing severe rash involving mucus membranes or skin necrosis: No Has patient had a PCN reaction that required hospitalization No Has patient had a PCN reaction occurring within the last 10 years: No If all of the above answers are NO, then may proceed with Cephalosporin use. Throat swelling   Metoclopramide  Other (See Comments)    Reaction:  Nightmares

## 2025-02-05 ENCOUNTER — Ambulatory Visit: Payer: Self-pay
